# Patient Record
Sex: Female | Born: 1940
Health system: Southern US, Community
[De-identification: ages and names within clinical notes are randomized; demographics above are authoritative.]

## PROBLEM LIST (undated history)

## (undated) DIAGNOSIS — M5136 Other intervertebral disc degeneration, lumbar region: Secondary | ICD-10-CM

## (undated) DIAGNOSIS — M51369 Other intervertebral disc degeneration, lumbar region without mention of lumbar back pain or lower extremity pain: Secondary | ICD-10-CM

## (undated) DIAGNOSIS — M1990 Unspecified osteoarthritis, unspecified site: Secondary | ICD-10-CM

## (undated) DIAGNOSIS — I340 Nonrheumatic mitral (valve) insufficiency: Secondary | ICD-10-CM

## (undated) DIAGNOSIS — I2510 Atherosclerotic heart disease of native coronary artery without angina pectoris: Secondary | ICD-10-CM

## (undated) DIAGNOSIS — Z923 Personal history of irradiation: Secondary | ICD-10-CM

## (undated) DIAGNOSIS — I10 Essential (primary) hypertension: Secondary | ICD-10-CM

## (undated) DIAGNOSIS — Z9221 Personal history of antineoplastic chemotherapy: Secondary | ICD-10-CM

## (undated) DIAGNOSIS — I447 Left bundle-branch block, unspecified: Secondary | ICD-10-CM

## (undated) DIAGNOSIS — I5022 Chronic systolic (congestive) heart failure: Secondary | ICD-10-CM

## (undated) DIAGNOSIS — I428 Other cardiomyopathies: Secondary | ICD-10-CM

## (undated) DIAGNOSIS — K219 Gastro-esophageal reflux disease without esophagitis: Secondary | ICD-10-CM

## (undated) DIAGNOSIS — C801 Malignant (primary) neoplasm, unspecified: Secondary | ICD-10-CM

## (undated) DIAGNOSIS — I739 Peripheral vascular disease, unspecified: Secondary | ICD-10-CM

## (undated) DIAGNOSIS — N6459 Other signs and symptoms in breast: Secondary | ICD-10-CM

## (undated) HISTORY — DX: Chronic systolic (congestive) heart failure: I50.22

## (undated) HISTORY — DX: Essential (primary) hypertension: I10

## (undated) HISTORY — DX: Other signs and symptoms in breast: N64.59

## (undated) HISTORY — DX: Malignant (primary) neoplasm, unspecified: C80.1

## (undated) HISTORY — DX: Other cardiomyopathies: I42.8

## (undated) HISTORY — DX: Left bundle-branch block, unspecified: I44.7

## (undated) HISTORY — DX: Peripheral vascular disease, unspecified: I73.9

## (undated) HISTORY — DX: Nonrheumatic mitral (valve) insufficiency: I34.0

## (undated) HISTORY — PX: OOPHORECTOMY: SHX86

## (undated) HISTORY — DX: Atherosclerotic heart disease of native coronary artery without angina pectoris: I25.10

---

## 1974-07-05 HISTORY — PX: ABDOMINAL HYSTERECTOMY: SHX81

## 1978-07-05 HISTORY — PX: TUMOR REMOVAL: SHX12

## 2005-01-07 ENCOUNTER — Ambulatory Visit: Payer: Self-pay | Admitting: Unknown Physician Specialty

## 2005-02-01 ENCOUNTER — Emergency Department: Payer: Self-pay | Admitting: Emergency Medicine

## 2005-02-01 ENCOUNTER — Other Ambulatory Visit: Payer: Self-pay

## 2005-02-03 ENCOUNTER — Ambulatory Visit: Payer: Self-pay | Admitting: Unknown Physician Specialty

## 2005-02-19 ENCOUNTER — Ambulatory Visit: Payer: Self-pay | Admitting: Unknown Physician Specialty

## 2005-02-26 ENCOUNTER — Ambulatory Visit: Payer: Self-pay | Admitting: Surgery

## 2005-07-05 HISTORY — PX: CHOLECYSTECTOMY: SHX55

## 2005-09-29 ENCOUNTER — Ambulatory Visit: Payer: Self-pay

## 2006-01-17 ENCOUNTER — Ambulatory Visit (HOSPITAL_COMMUNITY): Admission: RE | Admit: 2006-01-17 | Discharge: 2006-01-17 | Payer: Self-pay | Admitting: Neurosurgery

## 2008-12-19 ENCOUNTER — Ambulatory Visit: Payer: Self-pay | Admitting: Internal Medicine

## 2009-03-18 ENCOUNTER — Ambulatory Visit: Payer: Self-pay | Admitting: General Practice

## 2009-04-04 ENCOUNTER — Ambulatory Visit: Payer: Self-pay | Admitting: General Practice

## 2009-05-05 ENCOUNTER — Ambulatory Visit: Payer: Self-pay | Admitting: General Practice

## 2009-06-04 ENCOUNTER — Ambulatory Visit: Payer: Self-pay | Admitting: General Practice

## 2010-10-15 ENCOUNTER — Ambulatory Visit: Payer: Self-pay | Admitting: Unknown Physician Specialty

## 2011-12-15 ENCOUNTER — Ambulatory Visit: Payer: Self-pay | Admitting: Physician Assistant

## 2011-12-29 ENCOUNTER — Ambulatory Visit (INDEPENDENT_AMBULATORY_CARE_PROVIDER_SITE_OTHER): Payer: Medicare Other | Admitting: Internal Medicine

## 2011-12-29 ENCOUNTER — Encounter: Payer: Self-pay | Admitting: Internal Medicine

## 2011-12-29 VITALS — BP 150/72 | HR 60 | Temp 98.3°F | Ht 62.5 in | Wt 176.0 lb

## 2011-12-29 DIAGNOSIS — E119 Type 2 diabetes mellitus without complications: Secondary | ICD-10-CM

## 2011-12-29 DIAGNOSIS — M25561 Pain in right knee: Secondary | ICD-10-CM | POA: Insufficient documentation

## 2011-12-29 DIAGNOSIS — M25569 Pain in unspecified knee: Secondary | ICD-10-CM

## 2011-12-29 DIAGNOSIS — E1159 Type 2 diabetes mellitus with other circulatory complications: Secondary | ICD-10-CM | POA: Insufficient documentation

## 2011-12-29 DIAGNOSIS — E785 Hyperlipidemia, unspecified: Secondary | ICD-10-CM

## 2011-12-29 DIAGNOSIS — I10 Essential (primary) hypertension: Secondary | ICD-10-CM | POA: Insufficient documentation

## 2011-12-29 LAB — LIPID PANEL
Cholesterol: 244 mg/dL — ABNORMAL HIGH (ref 0–200)
HDL: 56.8 mg/dL (ref >39.00)
Triglycerides: 191 mg/dL — ABNORMAL HIGH (ref 0.0–149.0)
VLDL: 38.2 mg/dL (ref 0.0–40.0)

## 2011-12-29 LAB — COMPREHENSIVE METABOLIC PANEL
ALT: 18 U/L (ref 0–35)
AST: 18 U/L (ref 0–37)
Albumin: 4.6 g/dL (ref 3.5–5.2)
Alkaline Phosphatase: 67 U/L (ref 39–117)
BUN: 22 mg/dL (ref 6–23)
CO2: 27 mEq/L (ref 19–32)
Calcium: 9.7 mg/dL (ref 8.4–10.5)
Chloride: 103 mEq/L (ref 96–112)
Creatinine, Ser: 1.2 mg/dL (ref 0.4–1.2)
GFR: 45.77 mL/min — ABNORMAL LOW (ref >60.00)
Glucose, Bld: 142 mg/dL — ABNORMAL HIGH (ref 70–99)
Potassium: 4.6 mEq/L (ref 3.5–5.1)
Sodium: 138 mEq/L (ref 135–145)
Total Bilirubin: 0.5 mg/dL (ref 0.3–1.2)
Total Protein: 6.9 g/dL (ref 6.0–8.3)

## 2011-12-29 LAB — LDL CHOLESTEROL, DIRECT: Direct LDL: 152.7 mg/dL

## 2011-12-29 MED ORDER — GLIMEPIRIDE 2 MG PO TABS: 2.0000 mg | ORAL_TABLET | Freq: Two times a day (BID) | ORAL | Status: DC

## 2011-12-29 NOTE — Assessment & Plan Note (Signed)
Cholesterol is elevated on labs today. Question whether patient is taking Crestor. Goal LDL less than 70. Will confirm with patient and either resume dose of medication or increase to 20 mg daily. Plan to repeat lipids in one month.

## 2011-12-29 NOTE — Assessment & Plan Note (Signed)
Blood sugar controlled just above goal with hemoglobin A1c of 7.4%. For now, we'll continue current medications. Encouraged compliance with healthy diet and regular physical activity. Will plan to repeat A1c in September 2013.

## 2011-12-29 NOTE — Assessment & Plan Note (Signed)
Patient with severe right knee pain x3 months. Has had extensive evaluation including plain x-rays which were normal. Also had ultrasounds of her right lower extremity because of concern about blood clots which she reports was normal. Question if she may have a Baker's cyst in her posterior right knee. Alternative consideration would be meniscal tear. Will plan to get MRI for further evaluation. She is unable to tolerate nonsteroidals, so will continue Tylenol for pain.

## 2011-12-29 NOTE — Progress Notes (Signed)
Subjective:    Patient ID: Kelsey Mason, female    DOB: Sep 03, 1940, 71 y.o.   MRN: [id number]  HPI 71 year old female with history of diabetes, hypertension, hyperlipidemia presents to establish care. In regards to diabetes, she does not regularly check her blood sugars. She is unsure of last A1c. She reports full compliance with her medications. In regards to hyperlipidemia and hypertension, she also reports compliance with medication. She denies any chest pain, palpitations, headache.  Her primary concern today is severe right knee pain. This is been present since March 2013. It first occurred after a [place] trip. The pain is described as aching in the back of her knee which radiates down her leg. This has been evaluated both at urgent care and by an orthopedic surgeon. She also reports having x-rays which were normal. There was initially some concern about DVT in her right lower extremity and she had ultrasounds which were normal. She reports that pain is significant and limits her mobility. She occasionally feels some instability in her knee. She has not had any falls.  Outpatient Encounter Prescriptions as of 12/29/2011  Medication Sig Dispense Refill  . amLODipine-benazepril (LOTREL) 5-20 MG per capsule Take 1 capsule by mouth daily.      [name] aspirin (BAYER ASPIRIN) 325 MG tablet Take 325 mg by mouth daily.      . cyanocobalamin 2000 MCG tablet Take 2,000 mcg by mouth daily.      [name] glimepiride (AMARYL) 2 MG tablet Take 1 tablet (2 mg total) by mouth 2 (two) times daily.  60 tablet  6  . metFORMIN (GLUCOPHAGE) 1000 MG tablet Take 500 mg by mouth 2 (two) times daily.      . rosuvastatin (CRESTOR) 10 MG tablet Take 10 mg by mouth daily.      [name] DISCONTD: glimepiride (AMARYL) 2 MG tablet Take 2 mg by mouth 2 (two) times daily.        Review of Systems  Constitutional: Negative for fever, chills, appetite change, fatigue and unexpected weight change.  HENT: Negative for ear pain,  congestion, sore throat, trouble swallowing, neck pain, voice change and sinus pressure.   Eyes: Negative for visual disturbance.  Respiratory: Negative for cough, shortness of breath, wheezing and stridor.   Cardiovascular: Negative for chest pain, palpitations and leg swelling.  Gastrointestinal: Negative for nausea, vomiting, abdominal pain, diarrhea, constipation, blood in stool, abdominal distention and anal bleeding.  Genitourinary: Negative for dysuria and flank pain.  Musculoskeletal: Positive for myalgias, joint swelling and arthralgias. Negative for gait problem.  Skin: Negative for color change and rash.  Neurological: Negative for dizziness and headaches.  Hematological: Negative for adenopathy. Does not bruise/bleed easily.  Psychiatric/Behavioral: Negative for suicidal ideas, disturbed wake/sleep cycle and dysphoric mood. The patient is not nervous/anxious.    BP 150/72  Pulse 60  Temp 98.3 F (36.8 C) (Oral)  Ht 5' 2.5" (1.588 m)  Wt 176 lb (79.833 kg)  BMI 31.68 kg/m2  SpO2 97%     Objective:   Physical Exam  Constitutional: She is oriented to person, place, and time. She appears well-developed and well-nourished. No distress.  HENT:  Head: Normocephalic and atraumatic.  Right Ear: External ear normal.  Left Ear: External ear normal.  Nose: Nose normal.  Mouth/Throat: Oropharynx is clear and moist. No oropharyngeal exudate.  Eyes: Conjunctivae are normal. Pupils are equal, round, and reactive to light. Right eye exhibits no discharge. Left eye exhibits no discharge. No scleral icterus.  Neck: Normal  range of motion. Neck supple. No tracheal deviation present. No thyromegaly present.  Cardiovascular: Normal rate, regular rhythm, normal heart sounds and intact distal pulses.  Exam reveals no gallop and no friction rub.   No murmur heard. Pulmonary/Chest: Effort normal and breath sounds normal. No respiratory distress. She has no wheezes. She has no rales. She exhibits  no tenderness.  Musculoskeletal: She exhibits no edema and no tenderness.       Right knee: She exhibits decreased range of motion and swelling. She exhibits no effusion and no erythema.  Lymphadenopathy:    She has no cervical adenopathy.  Neurological: She is alert and oriented to person, place, and time. No cranial nerve deficit. She exhibits normal muscle tone. Coordination normal.  Skin: Skin is warm and dry. No rash noted. She is not diaphoretic. No erythema. No pallor.  Psychiatric: She has a normal mood and affect. Her behavior is normal. Judgment and thought content normal.          Assessment & Plan:

## 2011-12-29 NOTE — Assessment & Plan Note (Signed)
Blood pressure elevated today, but suspect related to pain. Will continue to monitor for now. We'll continue current medications. Renal function normal on labs today. Followup in one month.

## 2012-01-05 ENCOUNTER — Ambulatory Visit: Payer: Self-pay | Admitting: Internal Medicine

## 2012-01-07 ENCOUNTER — Telehealth: Payer: Self-pay | Admitting: Internal Medicine

## 2012-01-07 DIAGNOSIS — M1710 Unilateral primary osteoarthritis, unspecified knee: Secondary | ICD-10-CM

## 2012-01-07 NOTE — Telephone Encounter (Signed)
MRI right knee showed full thickness cartilage loss secondary to degenerative changes. Would recommend setting up orthopedics referral.

## 2012-01-07 NOTE — Telephone Encounter (Signed)
Left message on machine at home for patient to return call.

## 2012-01-10 NOTE — Telephone Encounter (Signed)
Patient advised as instructed via telephone, she agrees to see Orthopedics and would like to go to [facility] where she has been seen before.  Please call patient on cell number regarding referral.

## 2012-01-13 ENCOUNTER — Encounter: Payer: Self-pay | Admitting: Internal Medicine

## 2012-02-02 ENCOUNTER — Ambulatory Visit (INDEPENDENT_AMBULATORY_CARE_PROVIDER_SITE_OTHER): Payer: Medicare Other | Admitting: Internal Medicine

## 2012-02-02 ENCOUNTER — Encounter: Payer: Self-pay | Admitting: Internal Medicine

## 2012-02-02 VITALS — BP 156/80 | HR 68 | Temp 97.8°F | Ht 62.5 in | Wt 176.2 lb

## 2012-02-02 DIAGNOSIS — M79661 Pain in right lower leg: Secondary | ICD-10-CM

## 2012-02-02 DIAGNOSIS — M79662 Pain in left lower leg: Secondary | ICD-10-CM | POA: Insufficient documentation

## 2012-02-02 DIAGNOSIS — E119 Type 2 diabetes mellitus without complications: Secondary | ICD-10-CM

## 2012-02-02 DIAGNOSIS — M79609 Pain in unspecified limb: Secondary | ICD-10-CM

## 2012-02-02 DIAGNOSIS — E785 Hyperlipidemia, unspecified: Secondary | ICD-10-CM

## 2012-02-02 DIAGNOSIS — I10 Essential (primary) hypertension: Secondary | ICD-10-CM

## 2012-02-02 LAB — HM DIABETES EYE EXAM

## 2012-02-02 MED ORDER — AMLODIPINE BESY-BENAZEPRIL HCL 5-20 MG PO CAPS: 1.0000 | ORAL_CAPSULE | Freq: Every day | ORAL | Status: DC

## 2012-02-02 MED ORDER — GLUCOSE BLOOD VI STRP: ORAL_STRIP | Status: DC

## 2012-02-02 MED ORDER — METFORMIN HCL 1000 MG PO TABS: 500.0000 mg | ORAL_TABLET | Freq: Two times a day (BID) | ORAL | Status: DC

## 2012-02-02 MED ORDER — GLIMEPIRIDE 2 MG PO TABS: 2.0000 mg | ORAL_TABLET | Freq: Two times a day (BID) | ORAL | Status: DC

## 2012-02-02 NOTE — Assessment & Plan Note (Signed)
Patient reports full compliance with medications. We'll plan to check A1c with labs next week.

## 2012-02-02 NOTE — Assessment & Plan Note (Signed)
Blood pressure slightly elevated today. For now, we'll continue current medications. Will check renal function with labs next week. Patient will monitor blood pressure and call if consistently greater than 140/90. Patient will followup in 3 months.

## 2012-02-02 NOTE — Assessment & Plan Note (Signed)
LDL at previous visit in June 2013 was greater than 150. Discussed goal of less than 70 in the setting of diabetes. Patient has been more compliant Crestor. Will recheck fasting lipids next week.

## 2012-02-02 NOTE — Progress Notes (Signed)
Subjective:    Patient ID: Kelsey Mason, female    DOB: [birth year], 71 y.o.   MRN: [id number]  HPI 71 year old female with history of hypertension, hyperlipidemia, diabetes mellitus, and right greater than left leg and calf pain presents for followup. In the interim since her last visit, she reports she was seen by orthopedic surgery. She had MRI of her right knee which showed loss of cartilage. Orthopedic surgeon recommended that she undergo physical therapy to help with symptoms. This is planning to start tomorrow. She continues to have cramping pain in her calves and posterior right leg with exertion. She is not interested in vascular evaluation.  In regards to history of hypertension, hyperlipidemia, and diabetes, she reports full compliance with her medications. At her last visit, she was noted to have elevated LDL of 150. There is question about compliance of Crestor. She reports full compliance with medications at this point. She did not bring a record of blood sugars today.  Outpatient Encounter Prescriptions as of 02/02/2012  Medication Sig Dispense Refill  . amLODipine-benazepril (LOTREL) 5-20 MG per capsule Take 1 capsule by mouth daily.  90 capsule  4  . aspirin (BAYER ASPIRIN) 325 MG tablet Take 325 mg by mouth daily.      . cyanocobalamin 2000 MCG tablet Take 2,000 mcg by mouth daily.      [name] glimepiride (AMARYL) 2 MG tablet Take 1 tablet (2 mg total) by mouth 2 (two) times daily.  180 tablet  4  . metFORMIN (GLUCOPHAGE) 1000 MG tablet Take 0.5 tablets (500 mg total) by mouth 2 (two) times daily.  180 tablet  4  . rosuvastatin (CRESTOR) 10 MG tablet Take 10 mg by mouth daily.      [name] glucose blood (GLUCOLAB TEST) test strip Use as instructed  100 each  12   BP 156/80  Pulse 68  Temp 97.8 F (36.6 C) (Oral)  Ht 5' 2.5" (1.588 m)  Wt 176 lb 4 oz (79.946 kg)  BMI 31.72 kg/m2  SpO2 98%  Review of Systems  Constitutional: Negative for fever, chills, appetite change, fatigue  and unexpected weight change.  HENT: Negative for ear pain, congestion, sore throat, trouble swallowing, neck pain, voice change and sinus pressure.   Eyes: Negative for visual disturbance.  Respiratory: Negative for cough, shortness of breath, wheezing and stridor.   Cardiovascular: Negative for chest pain, palpitations and leg swelling.  Gastrointestinal: Negative for nausea, vomiting, abdominal pain, diarrhea, constipation, blood in stool, abdominal distention and anal bleeding.  Genitourinary: Negative for dysuria and flank pain.  Musculoskeletal: Positive for myalgias. Negative for arthralgias and gait problem.  Skin: Negative for color change and rash.  Neurological: Negative for dizziness and headaches.  Hematological: Negative for adenopathy. Does not bruise/bleed easily.  Psychiatric/Behavioral: Negative for suicidal ideas, disturbed wake/sleep cycle and dysphoric mood. The patient is not nervous/anxious.        Objective:   Physical Exam  Constitutional: She is oriented to person, place, and time. She appears well-developed and well-nourished. No distress.  HENT:  Head: Normocephalic and atraumatic.  Right Ear: External ear normal.  Left Ear: External ear normal.  Nose: Nose normal.  Mouth/Throat: Oropharynx is clear and moist. No oropharyngeal exudate.  Eyes: Conjunctivae are normal. Pupils are equal, round, and reactive to light. Right eye exhibits no discharge. Left eye exhibits no discharge. No scleral icterus.  Neck: Normal range of motion. Neck supple. No tracheal deviation present. No thyromegaly present.  Cardiovascular: Normal rate, regular rhythm,  normal heart sounds and intact distal pulses.  Exam reveals no gallop and no friction rub.   No murmur heard. Pulmonary/Chest: Effort normal and breath sounds normal. No respiratory distress. She has no wheezes. She has no rales. She exhibits no tenderness.  Musculoskeletal: Normal range of motion. She exhibits no edema and  no tenderness.  Lymphadenopathy:    She has no cervical adenopathy.  Neurological: She is alert and oriented to person, place, and time. No cranial nerve deficit. She exhibits normal muscle tone. Coordination normal.  Skin: Skin is warm and dry. No rash noted. She is not diaphoretic. No erythema. No pallor.  Psychiatric: She has a normal mood and affect. Her behavior is normal. Judgment and thought content normal.          Assessment & Plan:

## 2012-02-02 NOTE — Assessment & Plan Note (Signed)
Calf and right posterior thigh pain seem most consistent with claudication. Pedal pulses are normal. Encourage patient to consider vascular evaluation with ABIs and arterial Dopplers. She is not interested at this point.

## 2012-02-03 ENCOUNTER — Encounter: Payer: Self-pay | Admitting: Physician Assistant

## 2012-02-09 ENCOUNTER — Other Ambulatory Visit: Payer: Self-pay | Admitting: Internal Medicine

## 2012-02-09 ENCOUNTER — Other Ambulatory Visit (INDEPENDENT_AMBULATORY_CARE_PROVIDER_SITE_OTHER): Payer: Medicare Other | Admitting: *Deleted

## 2012-02-09 DIAGNOSIS — E119 Type 2 diabetes mellitus without complications: Secondary | ICD-10-CM

## 2012-02-09 DIAGNOSIS — E785 Hyperlipidemia, unspecified: Secondary | ICD-10-CM

## 2012-02-09 LAB — LIPID PANEL: Triglycerides: 107 mg/dL (ref 0.0–149.0)

## 2012-02-09 LAB — COMPREHENSIVE METABOLIC PANEL
ALT: 18 U/L (ref 0–35)
AST: 15 U/L (ref 0–37)
Albumin: 4.1 g/dL (ref 3.5–5.2)
Creatinine, Ser: 1.3 mg/dL — ABNORMAL HIGH (ref 0.4–1.2)
Total Bilirubin: 0.6 mg/dL (ref 0.3–1.2)

## 2012-02-09 LAB — HEMOGLOBIN A1C: Hgb A1c MFr Bld: 7.2 % — ABNORMAL HIGH (ref 4.6–6.5)

## 2012-02-09 MED ORDER — CYCLOBENZAPRINE HCL 5 MG PO TABS: 5.0000 mg | ORAL_TABLET | Freq: Three times a day (TID) | ORAL | Status: AC | PRN

## 2012-03-05 ENCOUNTER — Encounter: Payer: Self-pay | Admitting: Physician Assistant

## 2012-04-03 ENCOUNTER — Ambulatory Visit (INDEPENDENT_AMBULATORY_CARE_PROVIDER_SITE_OTHER): Payer: Medicare Other | Admitting: Internal Medicine

## 2012-04-03 ENCOUNTER — Encounter: Payer: Self-pay | Admitting: Internal Medicine

## 2012-04-03 VITALS — BP 128/64 | HR 71 | Temp 98.2°F | Ht 63.5 in | Wt 175.5 lb

## 2012-04-03 DIAGNOSIS — E785 Hyperlipidemia, unspecified: Secondary | ICD-10-CM

## 2012-04-03 DIAGNOSIS — I10 Essential (primary) hypertension: Secondary | ICD-10-CM

## 2012-04-03 DIAGNOSIS — E119 Type 2 diabetes mellitus without complications: Secondary | ICD-10-CM

## 2012-04-03 DIAGNOSIS — M79609 Pain in unspecified limb: Secondary | ICD-10-CM

## 2012-04-03 DIAGNOSIS — M79661 Pain in right lower leg: Secondary | ICD-10-CM

## 2012-04-03 NOTE — Progress Notes (Signed)
Subjective:    Patient ID: Kelsey Mason, female    DOB: [birth year], 71 y.o.   MRN: [id number]  HPI 71 year old female with history of hypertension, diabetes, hyperlipidemia presents for followup. In the interim since her last visit, she reports she was seen by her cardiologist and had blood work including cholesterol and A1c checked. She reports that she was told lab work looked good. She reports full compliance with medications. She denies any new concerns today. In regards to chronic bilateral calf pain, she reports no improvement after recent physical therapy. She reports cramping pain in her bilateral calves particularly with repetitive movements such as when driving her car. This has been long-standing for several years. She has declined vascular evaluation in the past.   In regards to health maintenance, note that patient declines mammogram and Pap smear.  Outpatient Encounter Prescriptions as of 04/03/2012  Medication Sig Dispense Refill  . amLODipine-benazepril (LOTREL) 5-20 MG per capsule Take 1 capsule by mouth daily.  90 capsule  4  . aspirin (BAYER ASPIRIN) 325 MG tablet Take 325 mg by mouth daily.      [name] glimepiride (AMARYL) 2 MG tablet Take 1 tablet (2 mg total) by mouth 2 (two) times daily.  180 tablet  4  . glucose blood (GLUCOLAB TEST) test strip Use as instructed  100 each  12  . metFORMIN (GLUCOPHAGE) 1000 MG tablet Take 0.5 tablets (500 mg total) by mouth 2 (two) times daily.  180 tablet  4  . rosuvastatin (CRESTOR) 10 MG tablet Take 10 mg by mouth daily.      . cyanocobalamin 2000 MCG tablet Take 2,000 mcg by mouth daily.       BP 128/64  Pulse 71  Temp 98.2 F (36.8 C) (Oral)  Ht 5' 3.5" (1.613 m)  Wt 175 lb 8 oz (79.606 kg)  BMI 30.60 kg/m2  SpO2 98%  Review of Systems  Constitutional: Negative for fever, chills, appetite change, fatigue and unexpected weight change.  HENT: Negative for ear pain, congestion, sore throat, trouble swallowing, neck pain, voice  change and sinus pressure.   Eyes: Negative for visual disturbance.  Respiratory: Negative for cough, shortness of breath, wheezing and stridor.   Cardiovascular: Negative for chest pain, palpitations and leg swelling.  Gastrointestinal: Negative for nausea, vomiting, abdominal pain, diarrhea, constipation, blood in stool, abdominal distention and anal bleeding.  Genitourinary: Negative for dysuria and flank pain.  Musculoskeletal: Positive for myalgias. Negative for arthralgias and gait problem.  Skin: Negative for color change and rash.  Neurological: Negative for dizziness and headaches.  Hematological: Negative for adenopathy. Does not bruise/bleed easily.  Psychiatric/Behavioral: Negative for suicidal ideas, disturbed wake/sleep cycle and dysphoric mood. The patient is not nervous/anxious.        Objective:   Physical Exam  Constitutional: She is oriented to person, place, and time. She appears well-developed and well-nourished. No distress.  HENT:  Head: Normocephalic and atraumatic.  Right Ear: External ear normal.  Left Ear: External ear normal.  Nose: Nose normal.  Mouth/Throat: Oropharynx is clear and moist. No oropharyngeal exudate.  Eyes: Conjunctivae normal are normal. Pupils are equal, round, and reactive to light. Right eye exhibits no discharge. Left eye exhibits no discharge. No scleral icterus.  Neck: Normal range of motion. Neck supple. No tracheal deviation present. No thyromegaly present.  Cardiovascular: Normal rate, regular rhythm, normal heart sounds and intact distal pulses.  Exam reveals no gallop and no friction rub.   No murmur heard. Pulmonary/Chest: Effort  normal and breath sounds normal. No respiratory distress. She has no wheezes. She has no rales. She exhibits no tenderness.  Musculoskeletal: Normal range of motion. She exhibits no edema and no tenderness.  Lymphadenopathy:    She has no cervical adenopathy.  Neurological: She is alert and oriented to  person, place, and time. No cranial nerve deficit. She exhibits normal muscle tone. Coordination normal.  Skin: Skin is warm and dry. No rash noted. She is not diaphoretic. No erythema. No pallor.  Psychiatric: She has a normal mood and affect. Her behavior is normal. Judgment and thought content normal.          Assessment & Plan:

## 2012-04-03 NOTE — Assessment & Plan Note (Addendum)
Symptoms of pain in bilateral calves worsened with motion is unchanged. No improvement with physical therapy. Symptoms seem concerning for claudication however patient declines vascular evaluation with ABIs and arterial Dopplers. Will continue to monitor for now.

## 2012-04-03 NOTE — Assessment & Plan Note (Signed)
Pt reports A1c was recently checked with cardiologist. Will request records on this. Plan for q 6 month follow up here and q58month with cardiologist. Goal A1c<7%.

## 2012-04-03 NOTE — Assessment & Plan Note (Signed)
BP well controlled today. Will request recent labs including renal function from cardiologist. Follow up 6 months and prn.

## 2012-04-03 NOTE — Assessment & Plan Note (Signed)
Will request recent lipids and lfts from cardiologist. Continue Crestor. Follow up 6 months and prn.

## 2012-06-08 ENCOUNTER — Telehealth: Payer: Self-pay | Admitting: Internal Medicine

## 2012-06-08 NOTE — Telephone Encounter (Signed)
Patient Information:  Caller Name: Gusta  Phone: [phone]  Patient: Kelsey Mason, Kelsey Mason  Gender: Female  DOB: 1941-06-04  Age: 71 Years  PCP: Ronette Deter (Adults only)   Symptoms  Reason For Call & Symptoms: Got up with sore throat and cough; Is getting some tightness in chest.  Reviewed Health History In EMR: Yes  Reviewed Medications In EMR: Yes  Reviewed Allergies In EMR: Yes  Reviewed Surgeries / Procedures: Yes  Date of Onset of Symptoms: 06/08/2012  Guideline(s) Used:  Cough  Disposition Per Guideline:   See Today or Tomorrow in Office  Reason For Disposition Reached:   Patient wants to be seen  Advice Given:  Prevent Dehydration:  Drink adequate liquids.  Coughing Spasms:  Drink warm fluids. Inhale warm mist (Reason: both relax the airway and loosen up the phlegm).  Avoid Tobacco Smoke:  Smoking or being exposed to smoke makes coughs much worse.  Expected Course:   The expected course depends on what is causing the cough.  Viral bronchitis (chest cold) causes a cough that lasts 1 to 3 weeks. Sometimes you may cough up lots of phlegm (sputum, mucus). The mucus can normally be white, gray, yellow, or green.  Call Back If:  Difficulty breathing  Cough lasts more than 3 weeks  Fever lasts > 3 days  You become worse.  Office Follow Up:  Does the office need to follow up with this patient?: No  Instructions For The Office: N/A  Appointment Scheduled:  06/09/2012 15:50:00 Appointment Scheduled Provider:  Ronette Deter (Adults only)  RN Note:  Cough is dry and will cough hard enough to gag; Cough comes in episodes. No cold symptoms.

## 2012-06-09 ENCOUNTER — Encounter: Payer: Self-pay | Admitting: Internal Medicine

## 2012-06-09 ENCOUNTER — Ambulatory Visit (INDEPENDENT_AMBULATORY_CARE_PROVIDER_SITE_OTHER): Payer: Medicare Other | Admitting: Internal Medicine

## 2012-06-09 VITALS — BP 148/80 | HR 86 | Temp 98.5°F | Resp 16 | Wt 176.5 lb

## 2012-06-09 DIAGNOSIS — J40 Bronchitis, not specified as acute or chronic: Secondary | ICD-10-CM

## 2012-06-09 DIAGNOSIS — E119 Type 2 diabetes mellitus without complications: Secondary | ICD-10-CM

## 2012-06-09 MED ORDER — GLUCOSE BLOOD VI STRP: ORAL_STRIP | Status: DC

## 2012-06-09 MED ORDER — AZITHROMYCIN 250 MG PO TABS: ORAL_TABLET | ORAL | Status: DC

## 2012-06-09 MED ORDER — BENZONATATE 200 MG PO CAPS: 200.0000 mg | ORAL_CAPSULE | Freq: Two times a day (BID) | ORAL | Status: DC | PRN

## 2012-06-09 MED ORDER — GUAIFENESIN-CODEINE 100-10 MG/5ML PO SYRP: 5.0000 mL | ORAL_SOLUTION | Freq: Two times a day (BID) | ORAL | Status: DC | PRN

## 2012-06-11 DIAGNOSIS — J40 Bronchitis, not specified as acute or chronic: Secondary | ICD-10-CM | POA: Insufficient documentation

## 2012-06-11 NOTE — Progress Notes (Signed)
Subjective:    Patient ID: Kelsey Mason, female    DOB: August 31, 1940, 71 y.o.   MRN: [id number]  HPI 71 year old female with history of diabetes, hypertension, hyperlipidemia presents for acute visit complaining of four-day history of nasal congestion, postnasal drip, cough productive of purulent sputum, and mild shortness of breath. She has been taking over-the-counter cough and cold preparations with no. She denies any fever, chills, chest pain. She reports that blood sugars have been elevated since she has become sick. She reports compliance with her medications.  Outpatient Encounter Prescriptions as of 06/09/2012  Medication Sig Dispense Refill  . amLODipine-benazepril (LOTREL) 5-20 MG per capsule Take 1 capsule by mouth daily.  90 capsule  4  . aspirin (BAYER ASPIRIN) 325 MG tablet Take 325 mg by mouth daily.      . cyanocobalamin 2000 MCG tablet Take 2,000 mcg by mouth daily.      [name] glimepiride (AMARYL) 2 MG tablet Take 1 tablet (2 mg total) by mouth 2 (two) times daily.  180 tablet  4  . glucose blood (GLUCOLAB TEST) test strip Use as instructed  100 each  12  . metFORMIN (GLUCOPHAGE) 1000 MG tablet Take 0.5 tablets (500 mg total) by mouth 2 (two) times daily.  180 tablet  4  . rosuvastatin (CRESTOR) 10 MG tablet Take 10 mg by mouth daily.      . [DISCONTINUED] glucose blood (GLUCOLAB TEST) test strip Use as instructed  100 each  12  . azithromycin (ZITHROMAX Z-PAK) 250 MG tablet Take 2 pills day 1, then 1 pill daily days 2-5  6 each  0  . benzonatate (TESSALON) 200 MG capsule Take 1 capsule (200 mg total) by mouth 2 (two) times daily as needed for cough.  20 capsule  0  . guaiFENesin-codeine (ROBITUSSIN AC) 100-10 MG/5ML syrup Take 5 mLs by mouth 2 (two) times daily as needed for cough.  240 mL  0   BP 148/80  Pulse 86  Temp 98.5 F (36.9 C) (Oral)  Resp 16  Wt 176 lb 8 oz (80.06 kg)  SpO2 98%  Review of Systems  Constitutional: Positive for fatigue. Negative for fever,  chills and unexpected weight change.  HENT: Positive for congestion, rhinorrhea and postnasal drip. Negative for hearing loss, ear pain, nosebleeds, sore throat, facial swelling, sneezing, mouth sores, trouble swallowing, neck pain, neck stiffness, voice change, sinus pressure, tinnitus and ear discharge.   Eyes: Negative for pain, discharge, redness and visual disturbance.  Respiratory: Positive for cough and shortness of breath. Negative for chest tightness, wheezing and stridor.   Cardiovascular: Negative for chest pain, palpitations and leg swelling.  Musculoskeletal: Negative for myalgias and arthralgias.  Skin: Negative for color change and rash.  Neurological: Negative for dizziness, weakness, light-headedness and headaches.  Hematological: Negative for adenopathy.       Objective:   Physical Exam  Constitutional: She is oriented to person, place, and time. She appears well-developed and well-nourished. No distress.  HENT:  Head: Normocephalic and atraumatic.  Right Ear: External ear normal.  Left Ear: External ear normal.  Nose: Nose normal.  Mouth/Throat: Oropharynx is clear and moist. No oropharyngeal exudate.  Eyes: Conjunctivae normal are normal. Pupils are equal, round, and reactive to light. Right eye exhibits no discharge. Left eye exhibits no discharge. No scleral icterus.  Neck: Normal range of motion. Neck supple. No tracheal deviation present. No thyromegaly present.  Cardiovascular: Normal rate, regular rhythm, normal heart sounds and intact distal pulses.  Exam  reveals no gallop and no friction rub.   No murmur heard. Pulmonary/Chest: Effort normal. No accessory muscle usage. Not tachypneic. No respiratory distress. She has no decreased breath sounds. She has no wheezes. She has rhonchi (scattered). She has no rales. She exhibits no tenderness.  Musculoskeletal: Normal range of motion. She exhibits no edema and no tenderness.  Lymphadenopathy:    She has no cervical  adenopathy.  Neurological: She is alert and oriented to person, place, and time. No cranial nerve deficit. She exhibits normal muscle tone. Coordination normal.  Skin: Skin is warm and dry. No rash noted. She is not diaphoretic. No erythema. No pallor.  Psychiatric: She has a normal mood and affect. Her behavior is normal. Judgment and thought content normal.          Assessment & Plan:

## 2012-06-11 NOTE — Assessment & Plan Note (Signed)
Symptoms consistent with bronchitis. Will treat with azithromycin, and use Tessalon and Codeine for cough. Pt will call if symptoms are not improving over next 48hr.  If no improvement, would favor adding Prednisone taper.

## 2012-07-05 DIAGNOSIS — C801 Malignant (primary) neoplasm, unspecified: Secondary | ICD-10-CM

## 2012-07-05 DIAGNOSIS — Z923 Personal history of irradiation: Secondary | ICD-10-CM

## 2012-07-05 DIAGNOSIS — N6459 Other signs and symptoms in breast: Secondary | ICD-10-CM

## 2012-07-05 HISTORY — PX: PORTACATH PLACEMENT: SHX2246

## 2012-07-05 HISTORY — PX: BREAST SURGERY: SHX581

## 2012-07-05 HISTORY — DX: Personal history of irradiation: Z92.3

## 2012-07-05 HISTORY — DX: Other signs and symptoms in breast: N64.59

## 2012-07-05 HISTORY — DX: Malignant (primary) neoplasm, unspecified: C80.1

## 2012-10-02 ENCOUNTER — Ambulatory Visit (INDEPENDENT_AMBULATORY_CARE_PROVIDER_SITE_OTHER)
Admission: RE | Admit: 2012-10-02 | Discharge: 2012-10-02 | Disposition: A | Discharge status: Home / Self Care | Payer: Medicare Other | Source: Ambulatory Visit | Attending: Internal Medicine | Admitting: Internal Medicine

## 2012-10-02 ENCOUNTER — Ambulatory Visit (INDEPENDENT_AMBULATORY_CARE_PROVIDER_SITE_OTHER): Payer: Medicare Other | Admitting: Internal Medicine

## 2012-10-02 ENCOUNTER — Encounter: Payer: Self-pay | Admitting: Internal Medicine

## 2012-10-02 VITALS — BP 160/70 | HR 68 | Temp 98.0°F | Ht 63.0 in | Wt 179.0 lb

## 2012-10-02 DIAGNOSIS — R222 Localized swelling, mass and lump, trunk: Secondary | ICD-10-CM | POA: Insufficient documentation

## 2012-10-02 DIAGNOSIS — Z0000 Encounter for general adult medical examination without abnormal findings: Secondary | ICD-10-CM

## 2012-10-02 DIAGNOSIS — N63 Unspecified lump in unspecified breast: Secondary | ICD-10-CM

## 2012-10-02 DIAGNOSIS — N6320 Unspecified lump in the left breast, unspecified quadrant: Secondary | ICD-10-CM | POA: Insufficient documentation

## 2012-10-02 DIAGNOSIS — E785 Hyperlipidemia, unspecified: Secondary | ICD-10-CM

## 2012-10-02 DIAGNOSIS — I10 Essential (primary) hypertension: Secondary | ICD-10-CM

## 2012-10-02 DIAGNOSIS — E119 Type 2 diabetes mellitus without complications: Secondary | ICD-10-CM

## 2012-10-02 LAB — COMPREHENSIVE METABOLIC PANEL
ALT: 21 U/L (ref 0–35)
AST: 16 U/L (ref 0–37)
Albumin: 4.2 g/dL (ref 3.5–5.2)
Alkaline Phosphatase: 69 U/L (ref 39–117)
BUN: 17 mg/dL (ref 6–23)
CO2: 27 mEq/L (ref 19–32)
Calcium: 9 mg/dL (ref 8.4–10.5)
GFR: 57.34 mL/min — ABNORMAL LOW (ref >60.00)
Glucose, Bld: 150 mg/dL — ABNORMAL HIGH (ref 70–99)
Potassium: 4.5 mEq/L (ref 3.5–5.1)
Total Bilirubin: 0.5 mg/dL (ref 0.3–1.2)
Total Protein: 6.7 g/dL (ref 6.0–8.3)

## 2012-10-02 LAB — MICROALBUMIN / CREATININE URINE RATIO
Microalb Creat Ratio: 5.6 mg/g (ref 0.0–30.0)
Microalb, Ur: 4.3 mg/dL — ABNORMAL HIGH (ref 0.0–1.9)

## 2012-10-02 LAB — CBC WITH DIFFERENTIAL/PLATELET
Basophils Relative: 0.5 % (ref 0.0–3.0)
Eosinophils Relative: 8.2 % — ABNORMAL HIGH (ref 0.0–5.0)
Lymphocytes Relative: 19.4 % (ref 12.0–46.0)
MCHC: 33.7 g/dL (ref 30.0–36.0)
MCV: 88.3 fl (ref 78.0–100.0)
Monocytes Relative: 5.2 % (ref 3.0–12.0)
Platelets: 195 10*3/uL (ref 150.0–400.0)
RDW: 13 % (ref 11.5–14.6)
WBC: 7.1 10*3/uL (ref 4.5–10.5)

## 2012-10-02 LAB — LIPID PANEL: Cholesterol: 247 mg/dL — ABNORMAL HIGH (ref 0–200)

## 2012-10-02 MED ORDER — ROSUVASTATIN CALCIUM 10 MG PO TABS: 10.0000 mg | ORAL_TABLET | Freq: Every day | ORAL | Status: DC

## 2012-10-02 MED ORDER — AMLODIPINE BESY-BENAZEPRIL HCL 5-20 MG PO CAPS: 1.0000 | ORAL_CAPSULE | Freq: Every day | ORAL | Status: DC

## 2012-10-02 NOTE — Assessment & Plan Note (Addendum)
Left breast mass concerning for malignancy. Will get diagnostic mammogram and [place]. Will set up general surgery evaluation once results of imaging available.

## 2012-10-02 NOTE — Assessment & Plan Note (Signed)
Will check A1c with labs today. Continue current medications. 

## 2012-10-02 NOTE — Progress Notes (Signed)
Subjective:    Patient ID: Kelsey Mason, female    DOB: 03-17-1941, 72 y.o.   MRN: [id number]  HPI The patient is here for annual Medicare wellness examination and management of other chronic and acute problems.   The risk factors are reflected in the social history.  The roster of all physicians providing medical care to patient - is listed in the Snapshot section of the chart.  Activities of daily living:  The patient is 100% independent in all ADLs: dressing, toileting, feeding as well as independent mobility  Home safety : The patient has smoke detectors in the home. They wear seatbelts.  There are no firearms at home. There is no violence in the home.   There is no risks for hepatitis, STDs or HIV. There is no history of blood transfusion. They have no travel history to infectious disease endemic areas of the world.  The patient has seen their dentist in the last six month. ([facility]) They have seen their eye doctor in the last year. [facility]) No issues with hearing. They have deferred audiologic testing in the last year.   They do not  have excessive sun exposure. Discussed the need for sun protection: hats, long sleeves and use of sunscreen if there is significant sun exposure.   Diet: the importance of a healthy diet is discussed. They do have a relatively healthy diet. Limited vegetables.  The benefits of regular aerobic exercise were discussed. She dances 3 nights per week.  Depression screen: there are no signs or vegative symptoms of depression- irritability, change in appetite, anhedonia, sadness/tearfullness.  Cognitive assessment: the patient manages all their financial and personal affairs and is actively engaged. They could relate day,date,year and events.  The following portions of the patient's history were reviewed and updated as appropriate: allergies, current medications, past family history, past medical history,  past surgical history, past  social history  and problem list.  Visual acuity was not assessed per patient preference since she has regular follow up with her ophthalmologist. Hearing and body mass index were assessed and reviewed.   During the course of the visit the patient was educated and counseled about appropriate screening and preventive services including : fall prevention , diabetes screening, nutrition counseling, colorectal cancer screening, and recommended immunizations.    Patient is also concerned today about a nodular area on right lower chest wall. She reports this has been present for over one year. It is nontender. It does not seem to be changing in size. She is also concerned about new nodule in her left breast. This is been present for a couple of months. She feels that it might be increasing in size. It is not tender. She has refused mammogram in the past because of pain associated with the exam.  Outpatient Encounter Prescriptions as of 10/02/2012  Medication Sig Dispense Refill  . Acetaminophen (ARTHRITIS PAIN RELIEF PO) Take 650 mg by mouth as needed.      [name] amLODipine-benazepril (LOTREL) 5-20 MG per capsule Take 1 capsule by mouth daily.  90 capsule  4  . aspirin (BAYER ASPIRIN) 325 MG tablet Take 325 mg by mouth daily.      . Cyanocobalamin (VITAMIN B-12) 2500 MCG SUBL Place 2,500 mcg under the tongue daily.      [name] glimepiride (AMARYL) 2 MG tablet Take 1 tablet (2 mg total) by mouth 2 (two) times daily.  180 tablet  4  . glucose blood (GLUCOLAB TEST) test strip Use  as instructed  100 each  12  . metFORMIN (GLUCOPHAGE) 1000 MG tablet Take 0.5 tablets (500 mg total) by mouth 2 (two) times daily.  180 tablet  4  . rosuvastatin (CRESTOR) 10 MG tablet Take 10 mg by mouth daily.      [name] azithromycin (ZITHROMAX Z-PAK) 250 MG tablet Take 2 pills day 1, then 1 pill daily days 2-5  6 each  0  . benzonatate (TESSALON) 200 MG capsule Take 1 capsule (200 mg total) by mouth 2 (two) times daily as needed for cough.   20 capsule  0  . cyanocobalamin 2000 MCG tablet Take by mouth daily.       [name] guaiFENesin-codeine (ROBITUSSIN AC) 100-10 MG/5ML syrup Take 5 mLs by mouth 2 (two) times daily as needed for cough.  240 mL  0   No facility-administered encounter medications on file as of 10/02/2012.   BP 160/70  Pulse 68  Temp(Src) 98 F (36.7 C) (Oral)  Ht 5\' 3"  (1.6 m)  Wt 179 lb (81.194 kg)  BMI 31.72 kg/m2  SpO2 97%   Review of Systems  Constitutional: Negative for fever, chills, appetite change, fatigue and unexpected weight change.  HENT: Negative for ear pain, congestion, sore throat, trouble swallowing, neck pain, voice change and sinus pressure.   Eyes: Negative for visual disturbance.  Respiratory: Negative for cough, shortness of breath, wheezing and stridor.   Cardiovascular: Negative for chest pain, palpitations and leg swelling.  Gastrointestinal: Negative for nausea, vomiting, abdominal pain, diarrhea, constipation, blood in stool, abdominal distention and anal bleeding.  Genitourinary: Negative for dysuria and flank pain.  Musculoskeletal: Negative for myalgias, arthralgias and gait problem.  Skin: Negative for color change and rash.  Neurological: Negative for dizziness and headaches.  Hematological: Negative for adenopathy. Does not bruise/bleed easily.  Psychiatric/Behavioral: Negative for suicidal ideas, sleep disturbance and dysphoric mood. The patient is not nervous/anxious.        Objective:   Physical Exam  Constitutional: She is oriented to person, place, and time. She appears well-developed and well-nourished. No distress.  HENT:  Head: Normocephalic and atraumatic.  Right Ear: External ear normal.  Left Ear: External ear normal.  Nose: Nose normal.  Mouth/Throat: Oropharynx is clear and moist. No oropharyngeal exudate.  Eyes: Conjunctivae are normal. Pupils are equal, round, and reactive to light. Right eye exhibits no discharge. Left eye exhibits no discharge. No  scleral icterus.  Neck: Normal range of motion. Neck supple. No tracheal deviation present. No thyromegaly present.  Cardiovascular: Normal rate, regular rhythm, normal heart sounds and intact distal pulses.  Exam reveals no gallop and no friction rub.   No murmur heard. Pulmonary/Chest: Effort normal and breath sounds normal. No accessory muscle usage. Not tachypneic. No respiratory distress. She has no decreased breath sounds. She has no wheezes. She has no rhonchi. She has no rales. She exhibits no tenderness. Right breast exhibits mass. Right breast exhibits no inverted nipple, no nipple discharge, no skin change and no tenderness. Left breast exhibits mass. Left breast exhibits no inverted nipple, no nipple discharge, no skin change and no tenderness. Breasts are symmetrical.    Abdominal: Soft. Bowel sounds are normal. She exhibits no distension. There is no tenderness. There is no rebound and no guarding.  Musculoskeletal: Normal range of motion. She exhibits no edema and no tenderness.  Lymphadenopathy:    She has no cervical adenopathy.  Neurological: She is alert and oriented to person, place, and time. No cranial nerve deficit. She  exhibits normal muscle tone. Coordination normal.  Skin: Skin is warm and dry. No rash noted. She is not diaphoretic. No erythema. No pallor.  Psychiatric: She has a normal mood and affect. Her behavior is normal. Judgment and thought content normal.          Assessment & Plan:

## 2012-10-02 NOTE — Assessment & Plan Note (Signed)
Will check lipids with labs today. Continue Crestor.

## 2012-10-02 NOTE — Assessment & Plan Note (Signed)
BP Readings from Last 3 Encounters:  10/02/12 160/70  06/09/12 148/80  04/03/12 128/64   BP slightly elevated today, but pt reports has been well controlled at home. Will check renal function and urine microalbumin with labs. Continue Lotrel.

## 2012-10-02 NOTE — Assessment & Plan Note (Signed)
General medical exam normal today except as noted with palpable breast mass. Will set up diagnostic mammogram and breast US. Other health maintenance UTD except for pneumonia vaccine which pt declines. Will check labs including CMP, CBC, lipids, A1c.

## 2012-10-02 NOTE — Assessment & Plan Note (Signed)
Exam is most consistent with lipoma. CXR showed no abnormal rib findings. As above, will get mammogram and US breasts for further evaluation.

## 2012-10-05 ENCOUNTER — Telehealth: Payer: Self-pay | Admitting: Internal Medicine

## 2012-10-05 DIAGNOSIS — N63 Unspecified lump in unspecified breast: Secondary | ICD-10-CM

## 2012-10-05 NOTE — Telephone Encounter (Signed)
Left message to call back  

## 2012-10-05 NOTE — Telephone Encounter (Signed)
Mammogram was abnormal, showing nodular areas consistent with the exam that I performed in clinic. Per notes, this was discussed with patient during her exam. I have placed a referral to general surgery, Dr. Bary Castilla. Please make sure that this is okay with her. We should also make a follow up in a few weeks here after she sees Dr. Bary Castilla.

## 2012-10-06 ENCOUNTER — Telehealth: Payer: Self-pay | Admitting: Internal Medicine

## 2012-10-06 NOTE — Telephone Encounter (Signed)
Pt returned your call.  

## 2012-10-09 ENCOUNTER — Telehealth: Payer: Self-pay | Admitting: *Deleted

## 2012-10-09 DIAGNOSIS — R222 Localized swelling, mass and lump, trunk: Secondary | ICD-10-CM

## 2012-10-09 NOTE — Telephone Encounter (Signed)
Informed patient of results

## 2012-10-09 NOTE — Telephone Encounter (Signed)
Amber, Can you schedule?

## 2012-10-09 NOTE — Telephone Encounter (Signed)
Patient was informed of xray results and would like to proceed with CT. However she can not go until next week and the 17th she is not available.

## 2012-10-09 NOTE — Telephone Encounter (Signed)
Left message to call back  

## 2012-10-09 NOTE — Telephone Encounter (Signed)
Message copied by Ronaldo Miyamoto on Mon Oct 09, 2012 11:14 AM ------      Message from: Ronette Deter A      Created: Mon Oct 02, 2012 11:17 AM       No abnormalities were seen on xray. The radiologist recommended to proceed with CT chest for further evaluation. If pt would like, we can set this up. ------

## 2012-10-10 NOTE — Telephone Encounter (Signed)
Left message for patient to call back to schedule follow up appointment with Dr. Gilford Rile

## 2012-10-10 NOTE — Telephone Encounter (Signed)
Patient returned call and appointment was scheduled.

## 2012-10-11 ENCOUNTER — Telehealth: Payer: Self-pay | Admitting: *Deleted

## 2012-10-11 NOTE — Telephone Encounter (Signed)
Received a Fax from [place]: CPT [phone]- CAT scan of the chest without dye, has been APPROVED

## 2012-10-16 ENCOUNTER — Encounter: Payer: Self-pay | Admitting: General Surgery

## 2012-10-16 ENCOUNTER — Ambulatory Visit (INDEPENDENT_AMBULATORY_CARE_PROVIDER_SITE_OTHER): Payer: Medicare Other | Admitting: General Surgery

## 2012-10-16 ENCOUNTER — Ambulatory Visit
Admission: RE | Admit: 2012-10-16 | Discharge: 2012-10-16 | Disposition: A | Discharge status: Home / Self Care | Payer: Medicare Other | Source: Ambulatory Visit | Attending: Internal Medicine | Admitting: Internal Medicine

## 2012-10-16 ENCOUNTER — Other Ambulatory Visit: Payer: Self-pay | Admitting: Internal Medicine

## 2012-10-16 ENCOUNTER — Encounter: Payer: Self-pay | Admitting: *Deleted

## 2012-10-16 VITALS — BP 168/82 | HR 80 | Resp 14 | Ht 62.5 in | Wt 176.0 lb

## 2012-10-16 DIAGNOSIS — R222 Localized swelling, mass and lump, trunk: Secondary | ICD-10-CM

## 2012-10-16 DIAGNOSIS — R928 Other abnormal and inconclusive findings on diagnostic imaging of breast: Secondary | ICD-10-CM

## 2012-10-16 DIAGNOSIS — N63 Unspecified lump in unspecified breast: Secondary | ICD-10-CM | POA: Insufficient documentation

## 2012-10-16 NOTE — Patient Instructions (Addendum)
Patient advised to have core breast biopsy x 3 spots once she has her chest CT scan done. She is advised to bring results of CT scan to our office. Patient is advised of what the biopsy will entail.

## 2012-10-16 NOTE — Progress Notes (Signed)
Patient ID: Kelsey Mason, female   DOB: March 26, 1941, 72 y.o.   MRN: [id number]  Chief Complaint  Patient presents with  . New Patient Breast    Cat 4 mammogram    HPI  Kelsey Mason is a 72 y.o. female here today following up from here mammogram done on 10/02/12. This is her first ever mammogram.The patient states she found left breast nodule in August 2013 that has gotten larger. She states she has some pain occasionally. Her mother has a history of breast cancer with double mastectomy diagnosed in 28's. No other known family history of breast cancer. No personal history of breast problems.  HPI  Past Medical History  Diagnosis Date  . Diabetes mellitus   . Hypertension   . Breast symptom 2014    Past Surgical History  Procedure Laterality Date  . Cholecystectomy  2007  . Abdominal hysterectomy  1976    for pelvic pain  . Tumor removal Right 1980    right foot  . Oophorectomy Right     Family History  Problem Relation Age of Onset  . Alcohol abuse Mother   . Heart disease Mother   . Hypertension Mother   . Alcohol abuse Father   . Heart disease Father   . Hypertension Father   . Heart disease Brother     Social History History  Substance Use Topics  . Smoking status: Former Smoker -- 0.25 packs/day for 1 years    Types: Cigarettes  . Smokeless tobacco: Never Used  . Alcohol Use: No    Allergies  Allergen Reactions  . Advil (Ibuprofen) Other (See Comments)    Dizziness   . Aleve (Naproxen) Other (See Comments)    Dizziness  . Lipitor (Atorvastatin) Other (See Comments)    Leg pain, muscle ache  . Penicillins Hives    Current Outpatient Prescriptions  Medication Sig Dispense Refill  . Acetaminophen (ARTHRITIS PAIN RELIEF PO) Take 650 mg by mouth as needed.      [name] amLODipine-benazepril (LOTREL) 5-20 MG per capsule Take 1 capsule by mouth daily.  90 capsule  4  . aspirin 81 MG tablet Take 81 mg by mouth daily.      . Cyanocobalamin (VITAMIN  B-12) 2500 MCG SUBL Place 2,500 mcg under the tongue daily.      . cyanocobalamin 2000 MCG tablet Take by mouth daily.       [name] glimepiride (AMARYL) 2 MG tablet Take 1 tablet (2 mg total) by mouth 2 (two) times daily.  180 tablet  4  . glucose blood (GLUCOLAB TEST) test strip Use as instructed  100 each  12  . metFORMIN (GLUCOPHAGE) 1000 MG tablet Take 0.5 tablets (500 mg total) by mouth 2 (two) times daily.  180 tablet  4  . rosuvastatin (CRESTOR) 10 MG tablet Take 1 tablet (10 mg total) by mouth daily.  90 tablet  4   No current facility-administered medications for this visit.    Review of Systems Review of Systems  Constitutional: Negative.   Respiratory: Negative.   Cardiovascular: Negative.     Blood pressure 168/82, pulse 80, resp. rate 14, height 5' 2.5" (1.588 m), weight 176 lb (79.833 kg).  Physical Exam Physical Exam  Constitutional: She appears well-developed and well-nourished.  Eyes: Conjunctivae are normal. No scleral icterus.  Neck: Trachea normal. No mass and no thyromegaly present.  Cardiovascular: Normal rate, regular rhythm, normal heart sounds and normal pulses.   No murmur heard. Pulmonary/Chest: Effort normal  and breath sounds normal. Right breast exhibits no inverted nipple, no mass, no nipple discharge, no skin change and no tenderness. Left breast exhibits mass. Left breast exhibits no inverted nipple, no nipple discharge, no skin change and no tenderness. Breasts are symmetrical.    Abdominal: Soft. Normal appearance and bowel sounds are normal. There is no hepatosplenomegaly. There is no tenderness. No hernia.  Lymphadenopathy:    She has no cervical adenopathy.    She has axillary adenopathy.  Mid axillary area has a firm 2 cm mobile node right side.     Data Reviewed Her films were not available today. Mammogram and [place] reports reviewed.    Assessment    Pt has an ill defined left breast mass that needs core biopsy. Imaging reportedly showing 2  masses in right breast also.     Plan    Need to look at the mammogram and [place] myself. Have asked for these films. After review will plan for core biopsies of the identified masses.    Pt has a CT scan of chest scheduled fo later today-requested by Dr. Ronette Deter. Would like to look at this also.    Kelsey Mason 10/18/2012, 8:14 AM

## 2012-10-18 ENCOUNTER — Encounter: Payer: Self-pay | Admitting: General Surgery

## 2012-10-19 ENCOUNTER — Encounter: Payer: Self-pay | Admitting: Internal Medicine

## 2012-10-19 ENCOUNTER — Ambulatory Visit (INDEPENDENT_AMBULATORY_CARE_PROVIDER_SITE_OTHER): Payer: 59 | Admitting: Internal Medicine

## 2012-10-19 VITALS — BP 140/60 | HR 79 | Temp 98.2°F | Wt 178.0 lb

## 2012-10-19 DIAGNOSIS — R141 Gas pain: Secondary | ICD-10-CM

## 2012-10-19 DIAGNOSIS — N63 Unspecified lump in unspecified breast: Secondary | ICD-10-CM

## 2012-10-19 DIAGNOSIS — R222 Localized swelling, mass and lump, trunk: Secondary | ICD-10-CM

## 2012-10-19 DIAGNOSIS — R143 Flatulence: Secondary | ICD-10-CM

## 2012-10-19 DIAGNOSIS — R928 Other abnormal and inconclusive findings on diagnostic imaging of breast: Secondary | ICD-10-CM

## 2012-10-19 DIAGNOSIS — R140 Abdominal distension (gaseous): Secondary | ICD-10-CM | POA: Insufficient documentation

## 2012-10-19 DIAGNOSIS — R6881 Early satiety: Secondary | ICD-10-CM | POA: Insufficient documentation

## 2012-10-19 NOTE — Assessment & Plan Note (Signed)
Patient reports early satiety and abdominal distention. Exam is remarkable for tenderness in the epigastric area. As above, will get. Swallow. Will also consider CT of the abdomen based on findings from barium swallow. We also discussed referral to GI for upper endoscopy depending on the results of these tests.

## 2012-10-19 NOTE — Assessment & Plan Note (Signed)
Fullness noted in the chest wall on exam was not observed on CT of the chest. Suspect this represents increased fatty tissue. Will continue to monitor.

## 2012-10-19 NOTE — Assessment & Plan Note (Signed)
Patient reports early satiety and abdominal distention. She has nausea but no vomiting. Exam is remarkable for mild tenderness in epigastric area. Will get barium swallow for further evaluation. Question if she may have gastroparesis given her history of diabetes. Followup in 4 weeks or sooner if needed.

## 2012-10-19 NOTE — Assessment & Plan Note (Signed)
Recent diagnostic mammogram and ultrasound confirmed bilateral breast mass. Patient is scheduled for biopsy next week. Will follow.

## 2012-10-19 NOTE — Progress Notes (Signed)
Subjective:    Patient ID: Kelsey Mason, female    DOB: [birth year], 72 y.o.   MRN: [id number]  HPI 72 year old female with history of diabetes, hypertension, hyperlipidemia presents for followup. At her last visit, she was noted to have palpable left breast mass. She was sent for diagnostic mammogram which showed bilateral breast masses. She is scheduled for biopsy next week. She was also concerned about some fullness in her lower chest wall anteriorly. CT of the chest showed no acute findings in this location.  She is concerned about several week history of worsening abdominal distention and early satiety. She reports that she finishes a small portion of her meal and then can eat no further because of abdominal distention and nausea. She has not had any vomiting. She denies changes in her bowel movements. She denies fever or chills. She denies belching. She has not taken any medication for this.  Outpatient Encounter Prescriptions as of 10/19/2012  Medication Sig Dispense Refill  . Acetaminophen (ARTHRITIS PAIN RELIEF PO) Take 650 mg by mouth as needed.      [name] amLODipine-benazepril (LOTREL) 5-20 MG per capsule Take 1 capsule by mouth daily.  90 capsule  4  . aspirin 81 MG tablet Take 81 mg by mouth daily.      . Cyanocobalamin (VITAMIN B-12) 2500 MCG SUBL Place 2,500 mcg under the tongue daily.      . cyanocobalamin 2000 MCG tablet Take by mouth daily.       [name] glimepiride (AMARYL) 2 MG tablet Take 1 tablet (2 mg total) by mouth 2 (two) times daily.  180 tablet  4  . glucose blood (GLUCOLAB TEST) test strip Use as instructed  100 each  12  . metFORMIN (GLUCOPHAGE) 1000 MG tablet Take 0.5 tablets (500 mg total) by mouth 2 (two) times daily.  180 tablet  4  . rosuvastatin (CRESTOR) 10 MG tablet Take 1 tablet (10 mg total) by mouth daily.  90 tablet  4   No facility-administered encounter medications on file as of 10/19/2012.   BP 140/60  Pulse 79  Temp(Src) 98.2 F (36.8 C) (Oral)  Wt  178 lb (80.74 kg)  BMI 32.02 kg/m2  SpO2 97%  Review of Systems  Constitutional: Negative for fever, chills, appetite change, fatigue and unexpected weight change.  HENT: Negative for ear pain, congestion, sore throat, trouble swallowing, neck pain, voice change and sinus pressure.   Eyes: Negative for visual disturbance.  Respiratory: Negative for cough, shortness of breath, wheezing and stridor.   Cardiovascular: Negative for chest pain, palpitations and leg swelling.  Gastrointestinal: Positive for nausea and abdominal distention. Negative for vomiting, abdominal pain, diarrhea, constipation, blood in stool and anal bleeding.  Genitourinary: Negative for dysuria and flank pain.  Musculoskeletal: Negative for myalgias, arthralgias and gait problem.  Skin: Negative for color change and rash.  Neurological: Negative for dizziness and headaches.  Hematological: Negative for adenopathy. Does not bruise/bleed easily.  Psychiatric/Behavioral: Negative for suicidal ideas, sleep disturbance and dysphoric mood. The patient is not nervous/anxious.        Objective:   Physical Exam  Constitutional: She is oriented to person, place, and time. She appears well-developed and well-nourished. No distress.  HENT:  Head: Normocephalic and atraumatic.  Right Ear: External ear normal.  Left Ear: External ear normal.  Nose: Nose normal.  Mouth/Throat: Oropharynx is clear and moist. No oropharyngeal exudate.  Eyes: Conjunctivae are normal. Pupils are equal, round, and reactive to light. Right eye exhibits no  discharge. Left eye exhibits no discharge. No scleral icterus.  Neck: Normal range of motion. Neck supple. No tracheal deviation present. No thyromegaly present.  Cardiovascular: Normal rate, regular rhythm, normal heart sounds and intact distal pulses.  Exam reveals no gallop and no friction rub.   No murmur heard. Pulmonary/Chest: Effort normal and breath sounds normal. No accessory muscle usage.  Not tachypneic. No respiratory distress. She has no decreased breath sounds. She has no wheezes. She has no rhonchi. She has no rales. She exhibits no tenderness.  Abdominal: Soft. Bowel sounds are normal. She exhibits distension. She exhibits no mass. There is tenderness (epigastric area). There is no rebound and no guarding.  Musculoskeletal: Normal range of motion. She exhibits no edema and no tenderness.  Lymphadenopathy:    She has no cervical adenopathy.  Neurological: She is alert and oriented to person, place, and time. No cranial nerve deficit. She exhibits normal muscle tone. Coordination normal.  Skin: Skin is warm and dry. No rash noted. She is not diaphoretic. No erythema. No pallor.  Psychiatric: She has a normal mood and affect. Her behavior is normal. Judgment and thought content normal.          Assessment & Plan:

## 2012-10-24 ENCOUNTER — Encounter: Payer: Self-pay | Admitting: General Surgery

## 2012-10-24 ENCOUNTER — Ambulatory Visit (INDEPENDENT_AMBULATORY_CARE_PROVIDER_SITE_OTHER): Payer: Medicare Other | Admitting: General Surgery

## 2012-10-24 VITALS — BP 160/72 | HR 76 | Resp 14 | Ht 62.0 in | Wt 177.0 lb

## 2012-10-24 DIAGNOSIS — N63 Unspecified lump in unspecified breast: Secondary | ICD-10-CM

## 2012-10-24 NOTE — Patient Instructions (Addendum)
CARE AFTER BREAST BIOPSY  1. Leave the dressing on that your doctor applied after surgery. It is waterproof. You may bathe, shower and/or swim. The dressing will probably remain intact until your return office visit. If the dressing comes off, you will see small strips of tape against your skin on the incision. Do not remove these strips.  2. You may want to use a gauze,cloth or similar protection in your bra to prevent rubbing against your dressing and incision. This is not necessary, but you may feel more comfortable doing so.  3. It is recommended that you wear a bra day and night to give support to the breast. This will prevent the weight of the breast from pulling on the incision.  4. Your breast will feel hard and lumpy under the incision. Do not be alarmed. This is the underlying stitching of tissue. Softening of this tissue will occur in time.  5. Make sure you call the office and schedule an appointment in one week after your surgery. The office phone number is [phone]. The nurses at [facility] may have already done this for you.  6. You will notice about a week after your office visit that the strips of the tape on your incision will begin to loosen. These may then be removed.  7. Report to your doctor any of the following:  * Severe pain not relieved by your pain medication  *Redness of the incision  * Drainage from the incision  *Fever greater than 101 degrees  Patient to have a bilateral diagnostic mammogram at [facility] on 12-28-12 at 2:30 pm. She is aware of date, time, and instructions.  Pathology report will be relayed to her when available.

## 2012-10-24 NOTE — Progress Notes (Signed)
Patient ID: Kelsey Mason, female   DOB: [birth year], 72 y.o.   MRN: [id number]  Chief Complaint  Patient presents with  . Other    breast biopsy    HPI Kelsey Mason is a 72 y.o. female here today for her breast Biopsys. HPI  Past Medical History  Diagnosis Date  . Diabetes mellitus   . Hypertension   . Breast symptom 2014    Past Surgical History  Procedure Laterality Date  . Cholecystectomy  2007  . Abdominal hysterectomy  1976    for pelvic pain  . Tumor removal Right 1980    right foot  . Oophorectomy Right     Family History  Problem Relation Age of Onset  . Alcohol abuse Mother   . Heart disease Mother   . Hypertension Mother   . Alcohol abuse Father   . Heart disease Father   . Hypertension Father   . Heart disease Brother     Social History History  Substance Use Topics  . Smoking status: Former Smoker -- 0.25 packs/day for 1 years    Types: Cigarettes  . Smokeless tobacco: Never Used  . Alcohol Use: No    Allergies  Allergen Reactions  . Advil (Ibuprofen) Other (See Comments)    Dizziness   . Aleve (Naproxen) Other (See Comments)    Dizziness  . Lipitor (Atorvastatin) Other (See Comments)    Leg pain, muscle ache  . Penicillins Hives    Current Outpatient Prescriptions  Medication Sig Dispense Refill  . Acetaminophen (ARTHRITIS PAIN RELIEF PO) Take 650 mg by mouth as needed.      [name] amLODipine-benazepril (LOTREL) 5-20 MG per capsule Take 1 capsule by mouth daily.  90 capsule  4  . aspirin 81 MG tablet Take 81 mg by mouth daily.      . Cyanocobalamin (VITAMIN B-12) 2500 MCG SUBL Place 2,500 mcg under the tongue daily.      . cyanocobalamin 2000 MCG tablet Take by mouth daily.       [name] glimepiride (AMARYL) 2 MG tablet Take 1 tablet (2 mg total) by mouth 2 (two) times daily.  180 tablet  4  . glucose blood (GLUCOLAB TEST) test strip Use as instructed  100 each  12  . metFORMIN (GLUCOPHAGE) 1000 MG tablet Take 0.5 tablets (500 mg  total) by mouth 2 (two) times daily.  180 tablet  4  . rosuvastatin (CRESTOR) 10 MG tablet Take 1 tablet (10 mg total) by mouth daily.  90 tablet  4   No current facility-administered medications for this visit.    Review of Systems Review of Systems  Constitutional: Negative.   Respiratory: Negative.   Cardiovascular: Negative.     Blood pressure 160/72, pulse 76, resp. rate 14, height 5\' 2"  (1.575 m), weight 177 lb (80.287 kg).  Physical Exam Physical Exam  Data Reviewed [place] was performed and co0re biopsy of left breast mass at 9o'cl and right breast at 10 o'cl performed with clips placed. No definite mass seen at 2 o'cl as noted by prior US at [facility]   Assessment    Bilateral breast masses     Plan          Patient to have a bilateral diagnostic mammogram at [facility] on 12-28-12 at 2:30 pm. She is aware of date, time, and instructions. Office visit follow up to be arranged after mammogram.   Junie Panning G 10/24/2012, 11:02 AM

## 2012-10-25 ENCOUNTER — Telehealth: Payer: Self-pay | Admitting: *Deleted

## 2012-10-25 DIAGNOSIS — C50919 Malignant neoplasm of unspecified site of unspecified female breast: Secondary | ICD-10-CM

## 2012-10-25 NOTE — Telephone Encounter (Signed)
Patient to come in for discussion on 10-26-12 at 4:30 pm. MRI to be arranged.

## 2012-10-25 NOTE — Telephone Encounter (Signed)
Message copied by Dominga Ferry on Wed Oct 25, 2012  1:38 PM ------      Message from: Christene Lye      Created: Wed Oct 25, 2012 12:16 PM       Path results noted and pt informed by telephone. Given bilateral cancers, possible third one in right breast will get MRI done. Pt is agreeable to this.       MRI to be scheduled and pt to come tomorrow for discussion. ------

## 2012-10-25 NOTE — Telephone Encounter (Signed)
Patient contacted today to arrange for office discussion. This patient will come in on 10-26-12 at 4:30 pm. She is aware of date, time, and instructions. We will be arranging for a bilateral breast MRI at the [facility].   Dr. Jamal Collin also requested we cancel mammogram that was scheduled at [facility] for 12-28-12 and also cancel office visit follow up for July 2014. Debbie at [employer]. Patient will be informed of this at appointment tomorrow.

## 2012-10-26 ENCOUNTER — Ambulatory Visit: Payer: Self-pay | Admitting: Internal Medicine

## 2012-10-26 ENCOUNTER — Ambulatory Visit (INDEPENDENT_AMBULATORY_CARE_PROVIDER_SITE_OTHER): Payer: Medicare Other | Admitting: General Surgery

## 2012-10-26 ENCOUNTER — Encounter: Payer: Self-pay | Admitting: General Surgery

## 2012-10-26 VITALS — BP 172/70 | HR 82 | Resp 14 | Ht 62.0 in | Wt 179.0 lb

## 2012-10-26 DIAGNOSIS — D0590 Unspecified type of carcinoma in situ of unspecified breast: Secondary | ICD-10-CM

## 2012-10-26 DIAGNOSIS — D0592 Unspecified type of carcinoma in situ of left breast: Secondary | ICD-10-CM

## 2012-10-26 DIAGNOSIS — C50411 Malignant neoplasm of upper-outer quadrant of right female breast: Secondary | ICD-10-CM

## 2012-10-26 DIAGNOSIS — C50419 Malignant neoplasm of upper-outer quadrant of unspecified female breast: Secondary | ICD-10-CM

## 2012-10-27 ENCOUNTER — Encounter: Payer: Self-pay | Admitting: General Surgery

## 2012-10-27 ENCOUNTER — Telehealth: Payer: Self-pay | Admitting: Internal Medicine

## 2012-10-27 DIAGNOSIS — D0590 Unspecified type of carcinoma in situ of unspecified breast: Secondary | ICD-10-CM | POA: Insufficient documentation

## 2012-10-27 DIAGNOSIS — Z853 Personal history of malignant neoplasm of breast: Secondary | ICD-10-CM | POA: Insufficient documentation

## 2012-10-27 NOTE — Telephone Encounter (Signed)
Left message to call back  

## 2012-10-27 NOTE — Telephone Encounter (Signed)
Upper GI showed severe gastroesophageal reflux. I would recommend that we set up visit with GI physician for evaluation (unless this has already been scheduled).

## 2012-10-27 NOTE — Patient Instructions (Signed)
MRI to be scheduled

## 2012-10-27 NOTE — Progress Notes (Signed)
Patient ID: Kelsey Mason, female   DOB: 1941/01/06, 72 y.o.   MRN: [id number]  Pt was here for discussion.Pathology report on core biopsies-left side with DCIS and right side with invasive mammary cancer. Patient was explained in full about the reports. There is a questionable third area in right breast on US imaging-was not well seen to biopsy. ER/PR and Her 2 are being processed.  Given bilateral involvement feel MRI would be useful. This is to be scheduled. Pt is amenable to either bilateral lumpectomy or mastectomy. She does not have any interest in reconstruction. Will see her back after MRI completed.

## 2012-10-30 NOTE — Telephone Encounter (Signed)
Patient informed but would like to hold off on referral to GI specialist. Per patient she will call back and let us know after her MRI.

## 2012-11-01 LAB — PATHOLOGY

## 2012-11-01 LAB — HM MAMMOGRAPHY

## 2012-11-02 ENCOUNTER — Ambulatory Visit
Admission: RE | Admit: 2012-11-02 | Discharge: 2012-11-02 | Disposition: A | Discharge status: Home / Self Care | Payer: PRIVATE HEALTH INSURANCE | Source: Ambulatory Visit | Attending: General Surgery | Admitting: General Surgery

## 2012-11-02 DIAGNOSIS — C50919 Malignant neoplasm of unspecified site of unspecified female breast: Secondary | ICD-10-CM

## 2012-11-02 HISTORY — PX: BREAST EXCISIONAL BIOPSY: SUR124

## 2012-11-02 MED ORDER — GADOBENATE DIMEGLUMINE 529 MG/ML IV SOLN
16.0000 mL | Freq: Once | INTRAVENOUS | Status: AC | PRN
  Administered 2012-11-02: 16 mL via INTRAVENOUS

## 2012-11-08 ENCOUNTER — Encounter: Payer: Self-pay | Admitting: General Surgery

## 2012-11-09 ENCOUNTER — Ambulatory Visit (INDEPENDENT_AMBULATORY_CARE_PROVIDER_SITE_OTHER): Payer: Medicare Other | Admitting: General Surgery

## 2012-11-09 ENCOUNTER — Encounter: Payer: Self-pay | Admitting: General Surgery

## 2012-11-09 VITALS — BP 160/78 | HR 74 | Resp 14 | Ht 66.0 in | Wt 178.0 lb

## 2012-11-09 DIAGNOSIS — D0590 Unspecified type of carcinoma in situ of unspecified breast: Secondary | ICD-10-CM

## 2012-11-09 DIAGNOSIS — D0592 Unspecified type of carcinoma in situ of left breast: Secondary | ICD-10-CM

## 2012-11-09 DIAGNOSIS — C50411 Malignant neoplasm of upper-outer quadrant of right female breast: Secondary | ICD-10-CM

## 2012-11-09 DIAGNOSIS — C50419 Malignant neoplasm of upper-outer quadrant of unspecified female breast: Secondary | ICD-10-CM

## 2012-11-09 NOTE — Patient Instructions (Addendum)
Patient's surgery to be arranged.

## 2012-11-09 NOTE — Progress Notes (Signed)
Patient ID: Kelsey Mason, female   DOB: [birth year], 72 y.o.   MRN: [id number]  Chief Complaint  Patient presents with  . Follow-up    Breast MRI    HPI Kelsey Mason is a 72 y.o. female here for a follow up for bilateral breast MRI. HPI  Past Medical History  Diagnosis Date  . Diabetes mellitus   . Hypertension   . Breast symptom 2014    Past Surgical History  Procedure Laterality Date  . Cholecystectomy  2007  . Abdominal hysterectomy  1976    for pelvic pain  . Tumor removal Right 1980    right foot  . Oophorectomy Right   . Breast surgery Bilateral 2014    Family History  Problem Relation Age of Onset  . Alcohol abuse Mother   . Heart disease Mother   . Hypertension Mother   . Alcohol abuse Father   . Heart disease Father   . Hypertension Father   . Heart disease Brother     Social History History  Substance Use Topics  . Smoking status: Former Smoker -- 0.25 packs/day for 1 years    Types: Cigarettes  . Smokeless tobacco: Never Used  . Alcohol Use: No    Allergies  Allergen Reactions  . Advil (Ibuprofen) Other (See Comments)    Dizziness   . Aleve (Naproxen) Other (See Comments)    Dizziness  . Lipitor (Atorvastatin) Other (See Comments)    Leg pain, muscle ache  . Penicillins Hives    Current Outpatient Prescriptions  Medication Sig Dispense Refill  . Acetaminophen (ARTHRITIS PAIN RELIEF PO) Take 650 mg by mouth as needed.      [name] amLODipine-benazepril (LOTREL) 5-20 MG per capsule Take 1 capsule by mouth daily.  90 capsule  4  . aspirin 81 MG tablet Take 81 mg by mouth daily.      . Cyanocobalamin (VITAMIN B-12) 2500 MCG SUBL Place 2,500 mcg under the tongue daily.      . cyanocobalamin 2000 MCG tablet Take by mouth daily.       [name] glimepiride (AMARYL) 2 MG tablet Take 1 tablet (2 mg total) by mouth 2 (two) times daily.  180 tablet  4  . glucose blood (GLUCOLAB TEST) test strip Use as instructed  100 each  12  . metFORMIN  (GLUCOPHAGE) 1000 MG tablet Take 0.5 tablets (500 mg total) by mouth 2 (two) times daily.  180 tablet  4  . rosuvastatin (CRESTOR) 10 MG tablet Take 1 tablet (10 mg total) by mouth daily.  90 tablet  4   No current facility-administered medications for this visit.    Review of Systems Review of Systems  Constitutional: Negative.   HENT: Negative.   Respiratory: Negative.   Cardiovascular: Negative.   Gastrointestinal: Negative.     Blood pressure 160/78, pulse 74, resp. rate 14, height 5\' 6"  (1.676 m), weight 178 lb (80.74 kg).  Physical Exam Physical Exam  Data Reviewed MRI of breasts showed only the two areas with biopsied cancer. Prior US suggested a possible mass in superior rt breast buth thies was not seen on [place] here at time of core biopsy.   Assessment    Rt breast invasive Ca, ER/PR pos, Her 2 neg. . Left breast DCIS.     Plan    Discussed treatment options with pt. She is inclined to have bilateral lumpectomy with SN biopsy. Role of radiation afer, potential need for chemo if nodes involved explained to  pt.     Patient's surgery to be arranged.   Roald Lukacs G 11/09/2012, 9:52 PM

## 2012-11-10 ENCOUNTER — Ambulatory Visit: Payer: Medicare Other | Admitting: General Surgery

## 2012-11-10 ENCOUNTER — Encounter: Payer: Self-pay | Admitting: General Surgery

## 2012-11-10 NOTE — Progress Notes (Signed)
Patient ID: Kelsey Mason, female   DOB: July 21, 1940, 72 y.o.   MRN: [id number] Orders only encounter

## 2012-11-13 ENCOUNTER — Ambulatory Visit: Payer: Self-pay | Admitting: General Surgery

## 2012-11-13 LAB — COMPREHENSIVE METABOLIC PANEL
Alkaline Phosphatase: 86 U/L (ref 50–136)
Bilirubin,Total: 0.3 mg/dL (ref 0.2–1.0)
Chloride: 105 mmol/L (ref 98–107)
Co2: 26 mmol/L (ref 21–32)
EGFR (African American): 51 — ABNORMAL LOW
Glucose: 190 mg/dL — ABNORMAL HIGH (ref 65–99)
Osmolality: 281 (ref 275–301)
SGOT(AST): 21 U/L (ref 15–37)
Sodium: 136 mmol/L (ref 136–145)
Total Protein: 6.7 g/dL (ref 6.4–8.2)

## 2012-11-13 LAB — CBC WITH DIFFERENTIAL/PLATELET
Basophil #: 0.1 10*3/uL (ref 0.0–0.1)
Basophil %: 0.6 %
Eosinophil %: 6 %
HCT: 34.3 % — ABNORMAL LOW (ref 35.0–47.0)
HGB: 11.7 g/dL — ABNORMAL LOW (ref 12.0–16.0)
Lymphocyte #: 1.6 10*3/uL (ref 1.0–3.6)
Lymphocyte %: 16.7 %
MCV: 87 fL (ref 80–100)
Monocyte #: 0.6 x10 3/mm (ref 0.2–0.9)
Neutrophil #: 6.7 10*3/uL — ABNORMAL HIGH (ref 1.4–6.5)
Neutrophil %: 70.5 %
Platelet: 205 10*3/uL (ref 150–440)
RDW: 13 % (ref 11.5–14.5)

## 2012-11-14 ENCOUNTER — Telehealth: Payer: Self-pay | Admitting: Internal Medicine

## 2012-11-14 NOTE — Telephone Encounter (Signed)
We could move this visit out a bit, so that she has time to recover prior to follow up. Maybe 1-2 months out

## 2012-11-14 NOTE — Telephone Encounter (Signed)
Pt called stating that she is having surgery on 5/16 for Breast ca. She is wondering if her visit on 5/22 is really necessary, if so she will keep it. If not, please advise.    AB

## 2012-11-14 NOTE — Telephone Encounter (Signed)
Patient informed and already has an appointment scheduled in July.

## 2012-11-15 ENCOUNTER — Encounter: Payer: Self-pay | Admitting: General Surgery

## 2012-11-17 ENCOUNTER — Ambulatory Visit: Payer: Self-pay | Admitting: General Surgery

## 2012-11-17 DIAGNOSIS — D0590 Unspecified type of carcinoma in situ of unspecified breast: Secondary | ICD-10-CM

## 2012-11-17 DIAGNOSIS — C50419 Malignant neoplasm of upper-outer quadrant of unspecified female breast: Secondary | ICD-10-CM

## 2012-11-20 ENCOUNTER — Encounter: Payer: Self-pay | Admitting: Internal Medicine

## 2012-11-22 ENCOUNTER — Encounter: Payer: Self-pay | Admitting: General Surgery

## 2012-11-23 ENCOUNTER — Ambulatory Visit: Payer: 59 | Admitting: Internal Medicine

## 2012-11-28 ENCOUNTER — Encounter: Payer: Self-pay | Admitting: General Surgery

## 2012-11-29 ENCOUNTER — Encounter: Payer: Self-pay | Admitting: General Surgery

## 2012-11-29 ENCOUNTER — Ambulatory Visit: Payer: Self-pay | Admitting: Radiation Oncology

## 2012-11-29 ENCOUNTER — Ambulatory Visit (INDEPENDENT_AMBULATORY_CARE_PROVIDER_SITE_OTHER): Payer: Medicare Other | Admitting: General Surgery

## 2012-11-29 VITALS — BP 124/76 | HR 88 | Resp 12 | Ht 62.0 in | Wt 177.0 lb

## 2012-11-29 DIAGNOSIS — C50411 Malignant neoplasm of upper-outer quadrant of right female breast: Secondary | ICD-10-CM

## 2012-11-29 DIAGNOSIS — C50312 Malignant neoplasm of lower-inner quadrant of left female breast: Secondary | ICD-10-CM

## 2012-11-29 DIAGNOSIS — C50319 Malignant neoplasm of lower-inner quadrant of unspecified female breast: Secondary | ICD-10-CM | POA: Insufficient documentation

## 2012-11-29 DIAGNOSIS — C50419 Malignant neoplasm of upper-outer quadrant of unspecified female breast: Secondary | ICD-10-CM

## 2012-11-29 NOTE — Progress Notes (Signed)
Patient ID: Kelsey Mason, female   DOB: 1940-12-19, 72 y.o.   MRN: [id number] This is a 72 year old female following up from   blateral lumpectomy done on 11/17/12. Patient states no new breast problem. Bilateral breast incisions look clean and healing well.  This patient has been scheduled for an appointment with Dr. Noreene Filbert and Dr. Forest Gleason at the [facility] for 12-04-12 at 1 pm. She is aware of date, time, and instructions.   Path- right breast-21mm invasiive cancer, ER/PR pos, Her 2 neg. SN neg.           Left breast- 1.8cm invasive cancer, ER/PR pos, Her 2 pending, SN neg. Pt is advised fully on the pathology report. All margins were clear. Need for radiation therapy and possible chemotherapy and subsequent anti-hormonal therapy discussed in full with the patient.

## 2012-11-29 NOTE — Patient Instructions (Addendum)
Patient may return to work 12/04/12.   This patient has been scheduled for an appointment with Dr. Noreene Filbert and Dr. Forest Gleason at the [facility] for 12-04-12 at 1 pm.

## 2012-11-30 ENCOUNTER — Encounter: Payer: Self-pay | Admitting: General Surgery

## 2012-12-04 ENCOUNTER — Ambulatory Visit: Payer: Self-pay | Admitting: Oncology

## 2012-12-04 ENCOUNTER — Encounter: Payer: Self-pay | Admitting: General Surgery

## 2012-12-22 ENCOUNTER — Other Ambulatory Visit: Payer: Self-pay | Admitting: General Surgery

## 2012-12-22 DIAGNOSIS — C50312 Malignant neoplasm of lower-inner quadrant of left female breast: Secondary | ICD-10-CM

## 2012-12-22 DIAGNOSIS — C4330 Malignant melanoma of unspecified part of face: Secondary | ICD-10-CM

## 2012-12-25 ENCOUNTER — Ambulatory Visit: Payer: Medicare Other | Admitting: General Surgery

## 2012-12-27 ENCOUNTER — Ambulatory Visit: Payer: Medicare Other | Admitting: General Surgery

## 2013-01-01 ENCOUNTER — Encounter: Payer: Self-pay | Admitting: General Surgery

## 2013-01-01 ENCOUNTER — Ambulatory Visit: Payer: Self-pay | Admitting: General Surgery

## 2013-01-01 DIAGNOSIS — C50919 Malignant neoplasm of unspecified site of unspecified female breast: Secondary | ICD-10-CM

## 2013-01-02 ENCOUNTER — Ambulatory Visit: Payer: Self-pay | Admitting: Oncology

## 2013-01-04 ENCOUNTER — Ambulatory Visit: Payer: Medicare Other | Admitting: Internal Medicine

## 2013-01-04 ENCOUNTER — Ambulatory Visit: Payer: Medicare Other | Admitting: General Surgery

## 2013-01-09 LAB — CBC CANCER CENTER
HGB: 12.1 g/dL (ref 12.0–16.0)
MCH: 29.9 pg (ref 26.0–34.0)
MCV: 88 fL (ref 80–100)
Monocyte #: 0.5 x10 3/mm (ref 0.2–0.9)
Monocyte %: 5.9 %
Platelet: 176 x10 3/mm (ref 150–440)
RBC: 4.05 10*6/uL (ref 3.80–5.20)
WBC: 8.7 x10 3/mm (ref 3.6–11.0)

## 2013-01-09 LAB — COMPREHENSIVE METABOLIC PANEL
Albumin: 3.8 g/dL (ref 3.4–5.0)
Alkaline Phosphatase: 118 U/L (ref 50–136)
Anion Gap: 9 (ref 7–16)
Bilirubin,Total: 0.4 mg/dL (ref 0.2–1.0)
Calcium, Total: 9 mg/dL (ref 8.5–10.1)
Co2: 27 mmol/L (ref 21–32)
EGFR (Non-African Amer.): 39 — ABNORMAL LOW
Glucose: 339 mg/dL — ABNORMAL HIGH (ref 65–99)
Potassium: 4.2 mmol/L (ref 3.5–5.1)
SGOT(AST): 11 U/L — ABNORMAL LOW (ref 15–37)
SGPT (ALT): 33 U/L (ref 12–78)

## 2013-01-16 LAB — CBC CANCER CENTER
Basophil #: 0 x10 3/mm (ref 0.0–0.1)
Basophil %: 0.4 %
Eosinophil #: 0.3 x10 3/mm (ref 0.0–0.7)
Lymphocyte #: 0.6 x10 3/mm — ABNORMAL LOW (ref 1.0–3.6)
Lymphocyte %: 41.5 %
MCHC: 34.8 g/dL (ref 32.0–36.0)
Monocyte #: 0.1 x10 3/mm — ABNORMAL LOW (ref 0.2–0.9)
Monocyte %: 7.5 %
Neutrophil %: 29.8 %
Platelet: 114 x10 3/mm — ABNORMAL LOW (ref 150–440)
RBC: 4 10*6/uL (ref 3.80–5.20)
RDW: 12.9 % (ref 11.5–14.5)
WBC: 1.4 x10 3/mm — CL (ref 3.6–11.0)

## 2013-01-18 ENCOUNTER — Ambulatory Visit: Payer: Medicare Other | Admitting: General Surgery

## 2013-01-22 ENCOUNTER — Ambulatory Visit (INDEPENDENT_AMBULATORY_CARE_PROVIDER_SITE_OTHER): Payer: Medicare Other | Admitting: General Surgery

## 2013-01-22 ENCOUNTER — Encounter: Payer: Self-pay | Admitting: General Surgery

## 2013-01-22 VITALS — BP 120/82 | HR 82 | Resp 14 | Ht 62.0 in | Wt 174.0 lb

## 2013-01-22 DIAGNOSIS — C50319 Malignant neoplasm of lower-inner quadrant of unspecified female breast: Secondary | ICD-10-CM

## 2013-01-22 DIAGNOSIS — C50411 Malignant neoplasm of upper-outer quadrant of right female breast: Secondary | ICD-10-CM

## 2013-01-22 DIAGNOSIS — C50312 Malignant neoplasm of lower-inner quadrant of left female breast: Secondary | ICD-10-CM

## 2013-01-22 DIAGNOSIS — C50419 Malignant neoplasm of upper-outer quadrant of unspecified female breast: Secondary | ICD-10-CM

## 2013-01-22 NOTE — Patient Instructions (Addendum)
Patient to return in January 2015 with mammogram

## 2013-01-22 NOTE — Progress Notes (Signed)
This is an 72 year old female here today following up from port placement done on 01/01/13. Patients next treatment is 01/30/13. Port site well healed, no signs of infection.lungs are clear

## 2013-01-23 ENCOUNTER — Encounter: Payer: Self-pay | Admitting: General Surgery

## 2013-01-23 LAB — CBC CANCER CENTER
Basophil %: 0.8 %
Eosinophil #: 0.3 x10 3/mm (ref 0.0–0.7)
Eosinophil %: 1.1 %
HCT: 32.5 % — ABNORMAL LOW (ref 35.0–47.0)
Lymphocyte #: 1.5 x10 3/mm (ref 1.0–3.6)
Lymphocyte %: 6.5 %
MCHC: 34.3 g/dL (ref 32.0–36.0)
MCV: 87 fL (ref 80–100)
Monocyte #: 0.6 x10 3/mm (ref 0.2–0.9)
Monocyte %: 2.8 %
Neutrophil #: 20.1 x10 3/mm — ABNORMAL HIGH (ref 1.4–6.5)
RBC: 3.75 10*6/uL — ABNORMAL LOW (ref 3.80–5.20)
RDW: 12.9 % (ref 11.5–14.5)
WBC: 22.6 x10 3/mm — ABNORMAL HIGH (ref 3.6–11.0)

## 2013-01-30 LAB — CBC CANCER CENTER
Basophil #: 0.2 x10 3/mm — ABNORMAL HIGH (ref 0.0–0.1)
Eosinophil #: 0.3 x10 3/mm (ref 0.0–0.7)
Eosinophil %: 2.2 %
Lymphocyte #: 1.3 x10 3/mm (ref 1.0–3.6)
MCH: 30 pg (ref 26.0–34.0)
MCV: 86 fL (ref 80–100)
Neutrophil #: 10.2 x10 3/mm — ABNORMAL HIGH (ref 1.4–6.5)
Neutrophil %: 80.9 %
Platelet: 251 x10 3/mm (ref 150–440)

## 2013-01-30 LAB — COMPREHENSIVE METABOLIC PANEL
Alkaline Phosphatase: 107 U/L (ref 50–136)
Anion Gap: 10 (ref 7–16)
BUN: 13 mg/dL (ref 7–18)
Bilirubin,Total: 0.3 mg/dL (ref 0.2–1.0)
Calcium, Total: 8.7 mg/dL (ref 8.5–10.1)
Chloride: 102 mmol/L (ref 98–107)
EGFR (African American): 46 — ABNORMAL LOW
Glucose: 232 mg/dL — ABNORMAL HIGH (ref 65–99)
Osmolality: 281 (ref 275–301)
SGOT(AST): 11 U/L — ABNORMAL LOW (ref 15–37)
SGPT (ALT): 22 U/L (ref 12–78)
Sodium: 137 mmol/L (ref 136–145)

## 2013-02-01 ENCOUNTER — Ambulatory Visit (INDEPENDENT_AMBULATORY_CARE_PROVIDER_SITE_OTHER): Payer: Medicare Other | Admitting: Internal Medicine

## 2013-02-01 ENCOUNTER — Encounter: Payer: Self-pay | Admitting: Internal Medicine

## 2013-02-01 VITALS — BP 154/62 | HR 87 | Temp 98.3°F | Wt 179.0 lb

## 2013-02-01 DIAGNOSIS — E119 Type 2 diabetes mellitus without complications: Secondary | ICD-10-CM

## 2013-02-01 DIAGNOSIS — I10 Essential (primary) hypertension: Secondary | ICD-10-CM

## 2013-02-01 DIAGNOSIS — C50319 Malignant neoplasm of lower-inner quadrant of unspecified female breast: Secondary | ICD-10-CM

## 2013-02-01 NOTE — Assessment & Plan Note (Signed)
BP Readings from Last 3 Encounters:  02/01/13 154/62  01/22/13 120/82  11/29/12 124/76   BP slightly elevated today, however has generally been well controlled. Will monitor BP closely through chemotherapy. Continue current medications. Will check renal function with labs prior to chemo.

## 2013-02-01 NOTE — Progress Notes (Signed)
Subjective:    Patient ID: Kelsey Mason, female    DOB: 30-Oct-1940, 72 y.o.   MRN: [id number]  HPI 72 year old female with history of diabetes, hypertension, hyperlipidemia, and recent diagnosis of bilateral breast cancer presents for followup. She noted a lump in her breast approximately one year ago. She had never previously had a mammogram. She underwent a mammogram on 10/02/2012. Mammogram with abnormal with bilateral breast mass. She ultimately underwent biopsies which showed adenocarcinoma which was ER/PR positive. She is currently being treated with chemotherapy with Cytoxan and Adriamycin. She reports she is tolerating this well. She has had some watery diarrhea and has stopped her metformin. She has not been regularly checking her blood sugars. She has been compliant with other medications. She notes that plan for future is radiation therapy.  Outpatient Encounter Prescriptions as of 02/01/2013  Medication Sig Dispense Refill  . amLODipine-benazepril (LOTREL) 5-20 MG per capsule Take 1 capsule by mouth daily.  90 capsule  4  . aspirin 81 MG tablet Take 81 mg by mouth daily.      [name] glimepiride (AMARYL) 2 MG tablet Take 1 tablet (2 mg total) by mouth 2 (two) times daily.  180 tablet  4  . glucose blood (GLUCOLAB TEST) test strip Use as instructed  100 each  12  . promethazine (PHENERGAN) 25 MG tablet Take 25 mg by mouth every 6 (six) hours as needed for nausea.      . rosuvastatin (CRESTOR) 10 MG tablet Take 1 tablet (10 mg total) by mouth daily.  90 tablet  4  . Acetaminophen (ARTHRITIS PAIN RELIEF PO) Take 650 mg by mouth as needed.      . Cyanocobalamin (VITAMIN B-12) 2500 MCG SUBL Place 2,500 mcg under the tongue daily.      . cyanocobalamin 2000 MCG tablet Take by mouth daily.       . ondansetron (ZOFRAN) 4 MG tablet Take 1 tablet by mouth daily.      . traMADol (ULTRAM) 50 MG tablet        No facility-administered encounter medications on file as of 02/01/2013.   BP 154/62   Pulse 87  Temp(Src) 98.3 F (36.8 C) (Oral)  Wt 179 lb (81.194 kg)  BMI 32.73 kg/m2  SpO2 99%  Review of Systems  Constitutional: Positive for fatigue. Negative for fever, chills, appetite change and unexpected weight change.  HENT: Negative for ear pain, congestion, sore throat, trouble swallowing, neck pain, voice change and sinus pressure.   Eyes: Negative for visual disturbance.  Respiratory: Negative for cough, shortness of breath, wheezing and stridor.   Cardiovascular: Negative for chest pain, palpitations and leg swelling.  Gastrointestinal: Negative for nausea, vomiting, abdominal pain, diarrhea, constipation, blood in stool, abdominal distention and anal bleeding.  Genitourinary: Negative for dysuria and flank pain.  Musculoskeletal: Negative for myalgias, arthralgias and gait problem.  Skin: Negative for color change and rash.  Neurological: Negative for dizziness and headaches.  Hematological: Negative for adenopathy. Does not bruise/bleed easily.  Psychiatric/Behavioral: Negative for suicidal ideas, sleep disturbance and dysphoric mood. The patient is not nervous/anxious.        Objective:   Physical Exam  Constitutional: She is oriented to person, place, and time. She appears well-developed and well-nourished. No distress.  HENT:  Head: Normocephalic and atraumatic.  Right Ear: External ear normal.  Left Ear: External ear normal.  Nose: Nose normal.  Mouth/Throat: Oropharynx is clear and moist. No oropharyngeal exudate.  Eyes: Conjunctivae are normal. Pupils are equal,  round, and reactive to light. Right eye exhibits no discharge. Left eye exhibits no discharge. No scleral icterus.  Neck: Normal range of motion. Neck supple. No tracheal deviation present. No thyromegaly present.  Cardiovascular: Normal rate, regular rhythm, normal heart sounds and intact distal pulses.  Exam reveals no gallop and no friction rub.   No murmur heard. Pulmonary/Chest: Effort normal and  breath sounds normal. No accessory muscle usage. Not tachypneic. No respiratory distress. She has no decreased breath sounds. She has no wheezes. She has no rhonchi. She has no rales. She exhibits no tenderness.  Musculoskeletal: Normal range of motion. She exhibits no edema and no tenderness.  Lymphadenopathy:    She has no cervical adenopathy.  Neurological: She is alert and oriented to person, place, and time. No cranial nerve deficit. She exhibits normal muscle tone. Coordination normal.  Skin: Skin is warm and dry. No rash noted. She is not diaphoretic. No erythema. No pallor.  Psychiatric: She has a normal mood and affect. Her behavior is normal. Judgment and thought content normal.          Assessment & Plan:

## 2013-02-01 NOTE — Assessment & Plan Note (Signed)
Pt with bilateral breast cancer. Currently undergoing chemotherapy with Cytoxan and Adriamycin. Plan for XRT after chemo complete. Tolerating chemotherapy well. Will continue to monitor.

## 2013-02-01 NOTE — Assessment & Plan Note (Signed)
Lab Results  Component Value Date   HGBA1C 7.8* 10/02/2012   Will request A1c to be checked with labs during chemotherapy. Pt having diarrhea with chemotherapy, so will stop Metformin. Will continue Glimepiride and monitor BG closely. Foot exam normal today.

## 2013-02-02 ENCOUNTER — Ambulatory Visit: Payer: Self-pay | Admitting: Oncology

## 2013-02-06 LAB — CBC CANCER CENTER
HGB: 10.3 g/dL — ABNORMAL LOW (ref 12.0–16.0)
MCH: 30.1 pg (ref 26.0–34.0)
MCHC: 34.8 g/dL (ref 32.0–36.0)
MCV: 86 fL (ref 80–100)
Neutrophil #: 0.4 x10 3/mm — ABNORMAL LOW (ref 1.4–6.5)
Platelet: 109 x10 3/mm — ABNORMAL LOW (ref 150–440)
RDW: 13.4 % (ref 11.5–14.5)
WBC: 1.2 x10 3/mm — CL (ref 3.6–11.0)

## 2013-02-13 LAB — ER/PR,IMMUNOHISTOCHEM,PARAFFIN

## 2013-02-13 LAB — HER-2 / NEU, FISH
Avg Num CEP17 probes/nucleus:: 1.7
Avg Num Her-2 signals/nucleus:: 1.8
HER-2/CEP17 Ratio: 1.07
Number of Tumor Cells Counted:: 40

## 2013-02-13 LAB — CBC CANCER CENTER
Basophil %: 0.5 %
Eosinophil #: 0.7 x10 3/mm (ref 0.0–0.7)
HGB: 10.9 g/dL — ABNORMAL LOW (ref 12.0–16.0)
MCV: 87 fL (ref 80–100)
Monocyte #: 0.7 x10 3/mm (ref 0.2–0.9)
Monocyte %: 2.7 %
Neutrophil %: 88.7 %
Platelet: 165 x10 3/mm (ref 150–440)
RBC: 3.61 10*6/uL — ABNORMAL LOW (ref 3.80–5.20)
RDW: 14.2 % (ref 11.5–14.5)

## 2013-02-14 ENCOUNTER — Telehealth: Payer: Self-pay | Admitting: Internal Medicine

## 2013-02-14 NOTE — Telephone Encounter (Signed)
Recent A1c was elevated at 9.5%. This may be in part due to medications recently given with chemotherapy. We should monitor BG closely, as we may need to add insulin. Can you ask pt to check BG 2-3 times per day for the next few weeks and then let us set up a follow up in 4 weeks.

## 2013-02-14 NOTE — Telephone Encounter (Signed)
Patient informed and verbally agreed. Appt scheduled

## 2013-02-20 LAB — CBC CANCER CENTER
Basophil %: 1.4 %
Eosinophil #: 0.4 x10 3/mm (ref 0.0–0.7)
Eosinophil %: 3.6 %
HCT: 28.5 % — ABNORMAL LOW (ref 35.0–47.0)
HGB: 10 g/dL — ABNORMAL LOW (ref 12.0–16.0)
Lymphocyte %: 8.6 %
MCHC: 35 g/dL (ref 32.0–36.0)
Monocyte #: 0.6 x10 3/mm (ref 0.2–0.9)
Platelet: 247 x10 3/mm (ref 150–440)
RDW: 15.5 % — ABNORMAL HIGH (ref 11.5–14.5)
WBC: 10.1 x10 3/mm (ref 3.6–11.0)

## 2013-02-20 LAB — COMPREHENSIVE METABOLIC PANEL
Alkaline Phosphatase: 105 U/L (ref 50–136)
Bilirubin,Total: 0.3 mg/dL (ref 0.2–1.0)
Calcium, Total: 8.7 mg/dL (ref 8.5–10.1)
Chloride: 99 mmol/L (ref 98–107)
Co2: 26 mmol/L (ref 21–32)
EGFR (African American): 54 — ABNORMAL LOW
EGFR (Non-African Amer.): 47 — ABNORMAL LOW
Osmolality: 277 (ref 275–301)
Potassium: 4.2 mmol/L (ref 3.5–5.1)
SGOT(AST): 11 U/L — ABNORMAL LOW (ref 15–37)
SGPT (ALT): 20 U/L (ref 12–78)
Total Protein: 6.3 g/dL — ABNORMAL LOW (ref 6.4–8.2)

## 2013-02-27 LAB — CBC CANCER CENTER
Basophil #: 0.1 "x10 3/mm "
Basophil %: 4 %
Eosinophil #: 0.2 "x10 3/mm "
Eosinophil %: 13.7 %
HCT: 27.1 % — ABNORMAL LOW
HGB: 9.5 g/dL — ABNORMAL LOW
Lymphocyte %: 22.9 %
Lymphs Abs: 0.3 "x10 3/mm " — ABNORMAL LOW
MCH: 30.7 pg
MCHC: 35.3 g/dL
MCV: 87 fL
Monocyte #: 0.1 "x10 3/mm " — ABNORMAL LOW
Monocyte %: 9.4 %
Neutrophil #: 0.7 "x10 3/mm " — ABNORMAL LOW
Neutrophil %: 50 %
Platelet: 96 "x10 3/mm " — ABNORMAL LOW
RBC: 3.11 "x10 6/mm " — ABNORMAL LOW
RDW: 14.9 % — ABNORMAL HIGH
WBC: 1.5 "x10 3/mm " — CL

## 2013-02-28 ENCOUNTER — Encounter: Payer: Self-pay | Admitting: Internal Medicine

## 2013-02-28 ENCOUNTER — Ambulatory Visit (INDEPENDENT_AMBULATORY_CARE_PROVIDER_SITE_OTHER): Payer: Medicare Other | Admitting: Internal Medicine

## 2013-02-28 VITALS — BP 144/70 | HR 74 | Temp 98.4°F | Wt 174.0 lb

## 2013-02-28 DIAGNOSIS — C50419 Malignant neoplasm of upper-outer quadrant of unspecified female breast: Secondary | ICD-10-CM

## 2013-02-28 DIAGNOSIS — E119 Type 2 diabetes mellitus without complications: Secondary | ICD-10-CM

## 2013-02-28 DIAGNOSIS — I10 Essential (primary) hypertension: Secondary | ICD-10-CM

## 2013-02-28 NOTE — Progress Notes (Signed)
Subjective:    Patient ID: Kelsey Mason, female    DOB: May 21, 1941, 72 y.o.   MRN: [id number]  HPI 72 year old female with recent diagnosis of breast cancer currently undergoing chemotherapy, diabetes, hyperlipidemia, hypertension presents for followup. Recent hemoglobin A1c was 9.5% which was much higher than previous values which are typically been near 7%. However, during the last few months she has been undergoing chemotherapy and intermittently receiving prednisone. She has been checking her blood sugars at home and blood sugars have ranged from 120s to over 300. She has been compliant with glimepiride. She denies any blood sugars less than 70. She recently completed a course of Adriamycin. She reports ongoing symptoms of nausea and vomiting. She had some nausea this morning and took Phenergan with some improvement. After she completes 3 additional courses of chemotherapy she is scheduled to start radiation therapy.  Outpatient Encounter Prescriptions as of 02/28/2013  Medication Sig Dispense Refill  . Acetaminophen (ARTHRITIS PAIN RELIEF PO) Take 650 mg by mouth as needed.      [name] amLODipine-benazepril (LOTREL) 5-20 MG per capsule Take 1 capsule by mouth daily.  90 capsule  4  . aspirin 81 MG tablet Take 81 mg by mouth daily.      [name] glimepiride (AMARYL) 2 MG tablet Take 1 tablet (2 mg total) by mouth 2 (two) times daily.  180 tablet  4  . glucose blood (GLUCOLAB TEST) test strip Use as instructed  100 each  12  . ondansetron (ZOFRAN) 4 MG tablet Take 1 tablet by mouth daily.      . rosuvastatin (CRESTOR) 10 MG tablet Take 1 tablet (10 mg total) by mouth daily.  90 tablet  4  . traMADol (ULTRAM) 50 MG tablet       . Cyanocobalamin (VITAMIN B-12) 2500 MCG SUBL Place 2,500 mcg under the tongue daily.      . cyanocobalamin 2000 MCG tablet Take by mouth daily.       . promethazine (PHENERGAN) 25 MG tablet Take 25 mg by mouth every 6 (six) hours as needed for nausea.       No  facility-administered encounter medications on file as of 02/28/2013.   BP 144/70  Pulse 74  Temp(Src) 98.4 F (36.9 C) (Oral)  Wt 174 lb (78.926 kg)  BMI 31.82 kg/m2  SpO2 99%  Review of Systems  Constitutional: Positive for fatigue. Negative for fever, chills, appetite change and unexpected weight change.  HENT: Negative for ear pain, congestion, sore throat, trouble swallowing, neck pain, voice change and sinus pressure.   Eyes: Negative for visual disturbance.  Respiratory: Negative for cough, shortness of breath, wheezing and stridor.   Cardiovascular: Negative for chest pain, palpitations and leg swelling.  Gastrointestinal: Positive for nausea and vomiting. Negative for abdominal pain, diarrhea, constipation, blood in stool, abdominal distention and anal bleeding.  Genitourinary: Negative for dysuria and flank pain.  Musculoskeletal: Negative for myalgias, arthralgias and gait problem.  Skin: Negative for color change and rash.  Neurological: Negative for dizziness and headaches.  Hematological: Negative for adenopathy. Does not bruise/bleed easily.  Psychiatric/Behavioral: Negative for suicidal ideas, sleep disturbance and dysphoric mood. The patient is not nervous/anxious.        Objective:   Physical Exam  Constitutional: She is oriented to person, place, and time. She appears well-developed and well-nourished. No distress.  HENT:  Head: Normocephalic and atraumatic.  Right Ear: External ear normal.  Left Ear: External ear normal.  Nose: Nose normal.  Mouth/Throat: Oropharynx is  clear and moist. No oropharyngeal exudate.  Eyes: Conjunctivae are normal. Pupils are equal, round, and reactive to light. Right eye exhibits no discharge. Left eye exhibits no discharge. No scleral icterus.  Neck: Normal range of motion. Neck supple. No tracheal deviation present. No thyromegaly present.  Cardiovascular: Normal rate, regular rhythm, normal heart sounds and intact distal pulses.   Exam reveals no gallop and no friction rub.   No murmur heard. Pulmonary/Chest: Effort normal and breath sounds normal. No accessory muscle usage. Not tachypneic. No respiratory distress. She has no decreased breath sounds. She has no wheezes. She has no rhonchi. She has no rales. She exhibits no tenderness.  Musculoskeletal: Normal range of motion. She exhibits no edema and no tenderness.  Lymphadenopathy:    She has no cervical adenopathy.  Neurological: She is alert and oriented to person, place, and time. No cranial nerve deficit. She exhibits normal muscle tone. Coordination normal.  Skin: Skin is warm and dry. No rash noted. She is not diaphoretic. No erythema. No pallor.  Psychiatric: She has a normal mood and affect. Her behavior is normal. Judgment and thought content normal.          Assessment & Plan:

## 2013-02-28 NOTE — Assessment & Plan Note (Signed)
Blood sugars markedly elevated with A1c of 9.5%. Recent BG have been between 150 and 300. Recommended starting low dose of either long acting insulin, or, at a minimum, short acting insulin for BG>250. Pt declines. We discussed that oral medications, such as Onglyza, might be helpful, but I am reluctant to start these given potential for increased nausea, while she is on chemotherapy. She will monitor BG carefully and try to limited intake of processed sugars. She will email or drop off BG report in 1 month and follow up 2 months and prn.

## 2013-02-28 NOTE — Assessment & Plan Note (Signed)
Continues to go through chemotherapy. Having significant fatigue and nausea with Adriamycin which is being treated by her oncologist. Will continue to follow.

## 2013-03-05 ENCOUNTER — Ambulatory Visit: Payer: Self-pay | Admitting: Oncology

## 2013-03-06 LAB — CBC CANCER CENTER
Basophil #: 0.1 x10 3/mm (ref 0.0–0.1)
Basophil %: 0.8 %
Eosinophil #: 0.3 x10 3/mm (ref 0.0–0.7)
Eosinophil %: 1.4 %
HCT: 29 % — ABNORMAL LOW (ref 35.0–47.0)
HGB: 9.8 g/dL — ABNORMAL LOW (ref 12.0–16.0)
Lymphocyte %: 5.5 %
MCH: 29.9 pg (ref 26.0–34.0)
MCHC: 33.9 g/dL (ref 32.0–36.0)
MCV: 88 fL (ref 80–100)
Monocyte #: 0.7 x10 3/mm (ref 0.2–0.9)
Monocyte %: 4.2 %
Neutrophil %: 88.1 %
Platelet: 189 x10 3/mm (ref 150–440)
WBC: 17.6 x10 3/mm — ABNORMAL HIGH (ref 3.6–11.0)

## 2013-03-13 LAB — COMPREHENSIVE METABOLIC PANEL
BUN: 15 mg/dL (ref 7–18)
Co2: 26 mmol/L (ref 21–32)
Creatinine: 1.32 mg/dL — ABNORMAL HIGH (ref 0.60–1.30)
EGFR (African American): 47 — ABNORMAL LOW
EGFR (Non-African Amer.): 40 — ABNORMAL LOW
Osmolality: 288 (ref 275–301)
Potassium: 4.8 mmol/L (ref 3.5–5.1)
SGOT(AST): 10 U/L — ABNORMAL LOW (ref 15–37)
SGPT (ALT): 25 U/L (ref 12–78)
Sodium: 131 mmol/L — ABNORMAL LOW (ref 136–145)

## 2013-03-13 LAB — CBC CANCER CENTER
Basophil %: 1 %
Eosinophil #: 0.2 x10 3/mm (ref 0.0–0.7)
HCT: 30.6 % — ABNORMAL LOW (ref 35.0–47.0)
Lymphocyte #: 0.7 x10 3/mm — ABNORMAL LOW (ref 1.0–3.6)
MCHC: 33.3 g/dL (ref 32.0–36.0)
MCV: 91 fL (ref 80–100)
Monocyte #: 0.6 x10 3/mm (ref 0.2–0.9)
Neutrophil %: 83.2 %
Platelet: 249 x10 3/mm (ref 150–440)
RBC: 3.37 10*6/uL — ABNORMAL LOW (ref 3.80–5.20)
RDW: 17.2 % — ABNORMAL HIGH (ref 11.5–14.5)

## 2013-03-14 ENCOUNTER — Telehealth: Payer: Self-pay | Admitting: Internal Medicine

## 2013-03-14 DIAGNOSIS — E785 Hyperlipidemia, unspecified: Secondary | ICD-10-CM

## 2013-03-14 DIAGNOSIS — I10 Essential (primary) hypertension: Secondary | ICD-10-CM

## 2013-03-14 DIAGNOSIS — E119 Type 2 diabetes mellitus without complications: Secondary | ICD-10-CM

## 2013-03-14 MED ORDER — AMLODIPINE BESY-BENAZEPRIL HCL 5-20 MG PO CAPS: 1.0000 | ORAL_CAPSULE | Freq: Every day | ORAL | Status: DC

## 2013-03-14 MED ORDER — METFORMIN HCL 1000 MG PO TABS: 500.0000 mg | ORAL_TABLET | Freq: Two times a day (BID) | ORAL | Status: DC

## 2013-03-14 MED ORDER — GLUCOSE BLOOD VI STRP: ORAL_STRIP | Status: DC

## 2013-03-14 NOTE — Telephone Encounter (Signed)
She needs a visit to address high blood sugars.

## 2013-03-14 NOTE — Telephone Encounter (Signed)
Pt is needing rx for her meter and list of meds I gave you. Pt uses [facility]

## 2013-03-14 NOTE — Telephone Encounter (Signed)
Sent in prescriptions to the pharmacy but patient stated when she was at her other doctor's office her BS was over 500. They gave a shot of insulin and at home her numbers has been running in the high 200s. Has not been taking her Metformin because she has not had any, refill has been sent to the pharmacy.

## 2013-03-14 NOTE — Telephone Encounter (Signed)
Dr. Oliva Bustard would to speak with you regarding [name]-please call at your earliest convenience

## 2013-03-15 NOTE — Telephone Encounter (Signed)
Left message to call back  

## 2013-03-15 NOTE — Telephone Encounter (Signed)
Patient has been scheduled for an appointment.

## 2013-03-20 LAB — CBC CANCER CENTER
HGB: 9.5 g/dL — ABNORMAL LOW (ref 12.0–16.0)
Lymphocyte #: 0.3 x10 3/mm — ABNORMAL LOW (ref 1.0–3.6)
MCH: 30.5 pg (ref 26.0–34.0)
Monocyte #: 0.1 x10 3/mm — ABNORMAL LOW (ref 0.2–0.9)
Monocyte %: 8 %
Platelet: 76 x10 3/mm — ABNORMAL LOW (ref 150–440)
RBC: 3.11 10*6/uL — ABNORMAL LOW (ref 3.80–5.20)
WBC: 1 x10 3/mm — CL (ref 3.6–11.0)

## 2013-03-21 ENCOUNTER — Ambulatory Visit (INDEPENDENT_AMBULATORY_CARE_PROVIDER_SITE_OTHER): Payer: Medicare Other | Admitting: Internal Medicine

## 2013-03-21 ENCOUNTER — Encounter: Payer: Self-pay | Admitting: Internal Medicine

## 2013-03-21 VITALS — BP 148/68 | HR 86 | Temp 98.3°F | Wt 173.0 lb

## 2013-03-21 DIAGNOSIS — C50319 Malignant neoplasm of lower-inner quadrant of unspecified female breast: Secondary | ICD-10-CM

## 2013-03-21 DIAGNOSIS — E785 Hyperlipidemia, unspecified: Secondary | ICD-10-CM

## 2013-03-21 DIAGNOSIS — E119 Type 2 diabetes mellitus without complications: Secondary | ICD-10-CM

## 2013-03-21 DIAGNOSIS — I10 Essential (primary) hypertension: Secondary | ICD-10-CM

## 2013-03-21 NOTE — Assessment & Plan Note (Addendum)
Blood sugars have been intermittently elevated, particularly during chemotherapy. However, patient has a history of excellent control of blood sugars in the past and some recent blood sugars at home and been near 100 fasting. Will start NovoLog 4 units as needed for blood sugar greater than 250 prior to meals. Demonstrated how to give Novolog injection today. We discussed potentially adding long acting insulin such as Lantus, however I am reluctant to do this right now, as pt having some lower fasting BG. Will monitor BG 2-3 times daily. Followup in one week.

## 2013-03-21 NOTE — Progress Notes (Signed)
Subjective:    Patient ID: Kelsey Mason, female    DOB: 06-Feb-1941, 72 y.o.   MRN: [id number]  HPI 72 year old female currently undergoing chemotherapy for breast cancer presents for acute visit to address elevated blood sugars. She has a history of diabetes which has been well-controlled with metformin and Amaryl. Over the last several months, she has been undergoing chemotherapy and received several doses of prednisone. Recent A1c was 9.5%. She notes that blood sugars have intermittently been markedly elevated. During recent chemotherapy blood sugar was greater than 500. However, record of blood sugars from home shows fasting sugars near 100. She occasionally has blood sugars between 203 100 at home. She has never taken insulin.  Outpatient Encounter Prescriptions as of 03/21/2013  Medication Sig Dispense Refill  . Acetaminophen (ARTHRITIS PAIN RELIEF PO) Take 650 mg by mouth as needed.      [name] amLODipine-benazepril (LOTREL) 5-20 MG per capsule Take 1 capsule by mouth daily.  90 capsule  2  . aspirin 81 MG tablet Take 81 mg by mouth daily.      . Cyanocobalamin (VITAMIN B-12) 2500 MCG SUBL Place 2,500 mcg under the tongue daily.      . cyanocobalamin 2000 MCG tablet Take by mouth daily.       [name] glimepiride (AMARYL) 2 MG tablet Take 1 tablet (2 mg total) by mouth 2 (two) times daily.  180 tablet  4  . glucose blood (BAYER CONTOUR NEXT TEST) test strip Patient will use to check blood sugars at home 2-3 times daily. Dx. Code 250.00  100 each  12  . levofloxacin (LEVAQUIN) 500 MG tablet       . metFORMIN (GLUCOPHAGE) 1000 MG tablet Take 0.5 tablets (500 mg total) by mouth 2 (two) times daily.  180 tablet  2  . ondansetron (ZOFRAN) 4 MG tablet Take 1 tablet by mouth daily.      . promethazine (PHENERGAN) 25 MG tablet Take 25 mg by mouth every 6 (six) hours as needed for nausea.      . rosuvastatin (CRESTOR) 10 MG tablet Take 1 tablet (10 mg total) by mouth daily.  90 tablet  4  . traMADol  (ULTRAM) 50 MG tablet        No facility-administered encounter medications on file as of 03/21/2013.   BP 148/68  Pulse 86  Temp(Src) 98.3 F (36.8 C) (Oral)  Wt 173 lb (78.472 kg)  BMI 31.63 kg/m2  SpO2 99%  Review of Systems  Constitutional: Positive for fatigue. Negative for fever, chills, appetite change and unexpected weight change.  HENT: Negative for ear pain, congestion, sore throat, trouble swallowing, neck pain, voice change and sinus pressure.   Eyes: Negative for visual disturbance.  Respiratory: Negative for cough, shortness of breath, wheezing and stridor.   Cardiovascular: Negative for chest pain, palpitations and leg swelling.  Gastrointestinal: Negative for nausea, vomiting, abdominal pain, diarrhea, constipation, blood in stool, abdominal distention and anal bleeding.  Genitourinary: Negative for dysuria and flank pain.  Musculoskeletal: Negative for myalgias, arthralgias and gait problem.  Skin: Negative for color change and rash.  Neurological: Negative for dizziness and headaches.  Hematological: Negative for adenopathy. Does not bruise/bleed easily.  Psychiatric/Behavioral: Negative for suicidal ideas, sleep disturbance and dysphoric mood. The patient is not nervous/anxious.        Objective:   Physical Exam  Constitutional: She is oriented to person, place, and time. She appears well-developed and well-nourished. No distress.  HENT:  Head: Normocephalic and atraumatic.  Right Ear: External ear normal.  Left Ear: External ear normal.  Nose: Nose normal.  Mouth/Throat: Oropharynx is clear and moist. No oropharyngeal exudate.  Eyes: Conjunctivae are normal. Pupils are equal, round, and reactive to light. Right eye exhibits no discharge. Left eye exhibits no discharge. No scleral icterus.  Neck: Normal range of motion. Neck supple. No tracheal deviation present. No thyromegaly present.  Cardiovascular: Normal rate, regular rhythm, normal heart sounds and  intact distal pulses.  Exam reveals no gallop and no friction rub.   No murmur heard. Pulmonary/Chest: Effort normal and breath sounds normal. No accessory muscle usage. Not tachypneic. No respiratory distress. She has no decreased breath sounds. She has no wheezes. She has no rhonchi. She has no rales. She exhibits no tenderness.  Musculoskeletal: Normal range of motion. She exhibits no edema and no tenderness.  Lymphadenopathy:    She has no cervical adenopathy.  Neurological: She is alert and oriented to person, place, and time. No cranial nerve deficit. She exhibits normal muscle tone. Coordination normal.  Skin: Skin is warm and dry. No rash noted. She is not diaphoretic. No erythema. No pallor.  Psychiatric: She has a normal mood and affect. Her behavior is normal. Judgment and thought content normal.          Assessment & Plan:

## 2013-03-21 NOTE — Patient Instructions (Addendum)
Check blood sugars 3 times daily prior to meals. If blood sugar is >250, give 4 units of Novolog insulin.   Call if any blood sugars less 100 or persistent blood sugars >250.  Bring record of sugars to next visit.  Follow up 1 week.

## 2013-03-21 NOTE — Assessment & Plan Note (Signed)
Currently undergoing chemotherapy. Discussed that some medications such as prednisone, when given for nausea, may elevated BG. Will add Novolog as above. Pt will complete chemotherapy and start radiation therapy next month. Discussed that she may be able to stop insulin once chemotherapy complete.

## 2013-03-27 LAB — CBC CANCER CENTER
Basophil #: 0.1 x10 3/mm (ref 0.0–0.1)
Eosinophil #: 0.1 x10 3/mm (ref 0.0–0.7)
HGB: 9.7 g/dL — ABNORMAL LOW (ref 12.0–16.0)
Lymphocyte #: 1 x10 3/mm (ref 1.0–3.6)
Lymphocyte %: 7.2 %
MCH: 30.8 pg (ref 26.0–34.0)
MCHC: 33.9 g/dL (ref 32.0–36.0)
Monocyte #: 0.7 x10 3/mm (ref 0.2–0.9)
Monocyte %: 5.5 %
Neutrophil #: 11.7 x10 3/mm — ABNORMAL HIGH (ref 1.4–6.5)
RBC: 3.15 10*6/uL — ABNORMAL LOW (ref 3.80–5.20)
WBC: 13.6 x10 3/mm — ABNORMAL HIGH (ref 3.6–11.0)

## 2013-03-28 ENCOUNTER — Encounter: Payer: Self-pay | Admitting: Internal Medicine

## 2013-03-28 ENCOUNTER — Ambulatory Visit (INDEPENDENT_AMBULATORY_CARE_PROVIDER_SITE_OTHER): Payer: Medicare Other | Admitting: Internal Medicine

## 2013-03-28 VITALS — BP 160/80 | HR 93 | Temp 98.2°F | Wt 174.0 lb

## 2013-03-28 DIAGNOSIS — E119 Type 2 diabetes mellitus without complications: Secondary | ICD-10-CM

## 2013-03-28 NOTE — Progress Notes (Signed)
Subjective:    Patient ID: Kelsey Mason, female    DOB: Nov 11, 1940, 72 y.o.   MRN: [id number]  HPI 72 year old female with history of breast cancer on chemotherapy and diabetes presents for followup. At her last visit, blood sugars were extremely high. She was started on NovoLog insulin 4 units as needed for blood sugars greater than 250. However, over the last several weeks she has been off chemotherapy and has had poor appetite. Blood sugars have been low with some blood sugars low as 70. She has not required any insulin. She does continue to take Amaryl and metformin.  Outpatient Encounter Prescriptions as of 03/28/2013  Medication Sig Dispense Refill  . Acetaminophen (ARTHRITIS PAIN RELIEF PO) Take 650 mg by mouth as needed.      [name] amLODipine-benazepril (LOTREL) 5-20 MG per capsule Take 1 capsule by mouth daily.  90 capsule  2  . aspirin 81 MG tablet Take 81 mg by mouth daily.      . Cyanocobalamin (VITAMIN B-12) 2500 MCG SUBL Place 2,500 mcg under the tongue daily.      . cyanocobalamin 2000 MCG tablet Take by mouth daily.       [name] glimepiride (AMARYL) 2 MG tablet Take 1 tablet (2 mg total) by mouth 2 (two) times daily.  180 tablet  4  . glucose blood (BAYER CONTOUR NEXT TEST) test strip Patient will use to check blood sugars at home 2-3 times daily. Dx. Code 250.00  100 each  12  . metFORMIN (GLUCOPHAGE) 1000 MG tablet Take 0.5 tablets (500 mg total) by mouth 2 (two) times daily.  180 tablet  2  . ondansetron (ZOFRAN) 4 MG tablet Take 1 tablet by mouth daily.      . promethazine (PHENERGAN) 25 MG tablet Take 25 mg by mouth every 6 (six) hours as needed for nausea.      . rosuvastatin (CRESTOR) 10 MG tablet Take 1 tablet (10 mg total) by mouth daily.  90 tablet  4  . traMADol (ULTRAM) 50 MG tablet       . [DISCONTINUED] levofloxacin (LEVAQUIN) 500 MG tablet        No facility-administered encounter medications on file as of 03/28/2013.   BP 160/80  Pulse 93  Temp(Src) 98.2 F (36.8  C) (Oral)  Wt 174 lb (78.926 kg)  BMI 31.82 kg/m2  SpO2 97%  Review of Systems  Constitutional: Positive for fatigue. Negative for fever, chills, appetite change and unexpected weight change.  HENT: Negative for ear pain, congestion, sore throat, trouble swallowing, neck pain, voice change and sinus pressure.   Eyes: Negative for visual disturbance.  Respiratory: Negative for cough, shortness of breath, wheezing and stridor.   Cardiovascular: Negative for chest pain, palpitations and leg swelling.  Gastrointestinal: Positive for nausea (with chemotherapy). Negative for vomiting, abdominal pain, diarrhea, constipation, blood in stool, abdominal distention and anal bleeding.  Genitourinary: Negative for dysuria and flank pain.  Musculoskeletal: Negative for myalgias, arthralgias and gait problem.  Skin: Negative for color change and rash.  Neurological: Negative for dizziness and headaches.  Hematological: Negative for adenopathy. Does not bruise/bleed easily.  Psychiatric/Behavioral: Negative for suicidal ideas, sleep disturbance and dysphoric mood. The patient is not nervous/anxious.        Objective:   Physical Exam  Constitutional: She is oriented to person, place, and time. She appears well-developed and well-nourished. No distress.  HENT:  Head: Normocephalic and atraumatic.  Right Ear: External ear normal.  Left Ear: External ear normal.  Nose: Nose normal.  Mouth/Throat: Oropharynx is clear and moist. No oropharyngeal exudate.  Eyes: Conjunctivae are normal. Pupils are equal, round, and reactive to light. Right eye exhibits no discharge. Left eye exhibits no discharge. No scleral icterus.  Neck: Normal range of motion. Neck supple. No tracheal deviation present. No thyromegaly present.  Cardiovascular: Normal rate, regular rhythm, normal heart sounds and intact distal pulses.  Exam reveals no gallop and no friction rub.   No murmur heard. Pulmonary/Chest: Effort normal and  breath sounds normal. No accessory muscle usage. Not tachypneic. No respiratory distress. She has no decreased breath sounds. She has no wheezes. She has no rhonchi. She has no rales. She exhibits no tenderness.  Musculoskeletal: Normal range of motion. She exhibits no edema and no tenderness.  Lymphadenopathy:    She has no cervical adenopathy.  Neurological: She is alert and oriented to person, place, and time. No cranial nerve deficit. She exhibits normal muscle tone. Coordination normal.  Skin: Skin is warm and dry. No rash noted. She is not diaphoretic. No erythema. No pallor.  Psychiatric: She has a normal mood and affect. Her behavior is normal. Judgment and thought content normal.          Assessment & Plan:

## 2013-03-28 NOTE — Assessment & Plan Note (Signed)
Patient currently has good control of blood sugars with Amaryl and metformin alone.  She will plan to use 4 units Novolog Insulin if blood sugar prior to a meal is greater than 250. Plan followup in 4 weeks or sooner as needed.

## 2013-04-03 LAB — COMPREHENSIVE METABOLIC PANEL
Albumin: 3.8 g/dL (ref 3.4–5.0)
Alkaline Phosphatase: 88 U/L (ref 50–136)
BUN: 7 mg/dL (ref 7–18)
Bilirubin,Total: 0.4 mg/dL (ref 0.2–1.0)
Chloride: 100 mmol/L (ref 98–107)
EGFR (Non-African Amer.): 52 — ABNORMAL LOW
Glucose: 203 mg/dL — ABNORMAL HIGH (ref 65–99)
Osmolality: 279 (ref 275–301)
Potassium: 3.9 mmol/L (ref 3.5–5.1)
SGPT (ALT): 28 U/L (ref 12–78)
Total Protein: 6.2 g/dL — ABNORMAL LOW (ref 6.4–8.2)

## 2013-04-03 LAB — CBC CANCER CENTER
Basophil #: 0.1 x10 3/mm (ref 0.0–0.1)
HCT: 28.3 % — ABNORMAL LOW (ref 35.0–47.0)
HGB: 9.8 g/dL — ABNORMAL LOW (ref 12.0–16.0)
Lymphocyte #: 0.7 x10 3/mm — ABNORMAL LOW (ref 1.0–3.6)
MCH: 31.7 pg (ref 26.0–34.0)
MCV: 92 fL (ref 80–100)
Monocyte #: 0.5 x10 3/mm (ref 0.2–0.9)
Monocyte %: 6.4 %
Neutrophil #: 6.6 x10 3/mm — ABNORMAL HIGH (ref 1.4–6.5)
Neutrophil %: 83.3 %

## 2013-04-04 ENCOUNTER — Ambulatory Visit: Payer: Self-pay | Admitting: Oncology

## 2013-04-04 ENCOUNTER — Other Ambulatory Visit: Payer: Self-pay | Admitting: Internal Medicine

## 2013-04-04 NOTE — Telephone Encounter (Signed)
Eprescribed.

## 2013-04-10 LAB — CBC CANCER CENTER
Basophil #: 0.1 x10 3/mm (ref 0.0–0.1)
Eosinophil #: 0.5 x10 3/mm (ref 0.0–0.7)
Eosinophil %: 7.3 %
HGB: 9.9 g/dL — ABNORMAL LOW (ref 12.0–16.0)
Lymphocyte #: 0.7 x10 3/mm — ABNORMAL LOW (ref 1.0–3.6)
MCHC: 35.3 g/dL (ref 32.0–36.0)
MCV: 92 fL (ref 80–100)
Monocyte %: 7.1 %
Neutrophil %: 74.2 %
Platelet: 228 x10 3/mm (ref 150–440)
RDW: 16.6 % — ABNORMAL HIGH (ref 11.5–14.5)

## 2013-04-17 LAB — CBC CANCER CENTER
Basophil %: 1.1 %
Eosinophil #: 0.8 x10 3/mm — ABNORMAL HIGH (ref 0.0–0.7)
Eosinophil %: 9 %
HCT: 29 % — ABNORMAL LOW (ref 35.0–47.0)
HGB: 10.2 g/dL — ABNORMAL LOW (ref 12.0–16.0)
Lymphocyte %: 10 %
MCHC: 35.3 g/dL (ref 32.0–36.0)
Monocyte %: 5 %
Neutrophil #: 6.3 x10 3/mm (ref 1.4–6.5)
Neutrophil %: 74.9 %
Platelet: 246 x10 3/mm (ref 150–440)
RBC: 3.13 10*6/uL — ABNORMAL LOW (ref 3.80–5.20)
RDW: 16.3 % — ABNORMAL HIGH (ref 11.5–14.5)
WBC: 8.4 x10 3/mm (ref 3.6–11.0)

## 2013-04-24 LAB — CBC CANCER CENTER
Basophil #: 0.1 x10 3/mm (ref 0.0–0.1)
Basophil %: 1.1 %
Eosinophil #: 0.6 x10 3/mm (ref 0.0–0.7)
Eosinophil %: 6.8 %
Lymphocyte %: 8.6 %
MCH: 32.8 pg (ref 26.0–34.0)
MCHC: 35.1 g/dL (ref 32.0–36.0)
MCV: 93 fL (ref 80–100)
Monocyte #: 0.4 x10 3/mm (ref 0.2–0.9)
Monocyte %: 5 %
Neutrophil #: 6.9 x10 3/mm — ABNORMAL HIGH (ref 1.4–6.5)
Neutrophil %: 78.5 %
RDW: 15.7 % — ABNORMAL HIGH (ref 11.5–14.5)

## 2013-04-26 ENCOUNTER — Encounter: Payer: Self-pay | Admitting: Internal Medicine

## 2013-04-26 ENCOUNTER — Ambulatory Visit (INDEPENDENT_AMBULATORY_CARE_PROVIDER_SITE_OTHER): Payer: Medicare Other | Admitting: Internal Medicine

## 2013-04-26 ENCOUNTER — Encounter (INDEPENDENT_AMBULATORY_CARE_PROVIDER_SITE_OTHER): Payer: Self-pay

## 2013-04-26 VITALS — BP 162/72 | HR 86 | Temp 97.8°F | Wt 175.0 lb

## 2013-04-26 DIAGNOSIS — I10 Essential (primary) hypertension: Secondary | ICD-10-CM

## 2013-04-26 DIAGNOSIS — E119 Type 2 diabetes mellitus without complications: Secondary | ICD-10-CM

## 2013-04-26 DIAGNOSIS — G4700 Insomnia, unspecified: Secondary | ICD-10-CM | POA: Insufficient documentation

## 2013-04-26 DIAGNOSIS — D0590 Unspecified type of carcinoma in situ of unspecified breast: Secondary | ICD-10-CM

## 2013-04-26 NOTE — Progress Notes (Signed)
Subjective:    Patient ID: Kelsey Mason, female    DOB: [birth year], 72 y.o.   MRN: [id number]  HPI 72 year old female with history of breast cancer, undergoing chemotherapy, diabetes, hypertension presents for followup. She reports that blood sugars have been better controlled now that she is not receiving premedications for chemotherapy. She has now completed Adriamycin and is taking Taxol. She is tolerating this well. Blood sugars have been generally less than 250. She has not required any additional NovoLog. She has had some symptoms of "shakiness "and has been prescribed new medication for this but cannot recall what it is. Her only other concern today is recent difficulty falling asleep. She has tried OTC melatonin and benadryl with minimal improvement. She notes difficulty both falling asleep and staying asleep.  Outpatient Encounter Prescriptions as of 04/26/2013  Medication Sig Dispense Refill  . Acetaminophen (ARTHRITIS PAIN RELIEF PO) Take 650 mg by mouth as needed.      . ALPRAZolam (XANAX) 0.25 MG tablet       . amLODipine-benazepril (LOTREL) 5-20 MG per capsule Take 1 capsule by mouth daily.  90 capsule  2  . aspirin 81 MG tablet Take 81 mg by mouth daily.      . Cyanocobalamin (VITAMIN B-12) 2500 MCG SUBL Place 2,500 mcg under the tongue daily.      [name] glimepiride (AMARYL) 2 MG tablet Take 1 tablet (2 mg total) by mouth 2 (two) times daily.  180 tablet  4  . glucose blood (BAYER CONTOUR NEXT TEST) test strip Patient will use to check blood sugars at home 2-3 times daily. Dx. Code 250.00  100 each  12  . metFORMIN (GLUCOPHAGE) 1000 MG tablet Take 0.5 tablets (500 mg total) by mouth 2 (two) times daily.  180 tablet  2  . ondansetron (ZOFRAN) 4 MG tablet Take 1 tablet by mouth daily.      . promethazine (PHENERGAN) 25 MG tablet Take 25 mg by mouth every 6 (six) hours as needed for nausea.      . rosuvastatin (CRESTOR) 10 MG tablet Take 1 tablet (10 mg total) by mouth daily.  90  tablet  4  . cyanocobalamin 2000 MCG tablet Take by mouth daily.       . traMADol (ULTRAM) 50 MG tablet        No facility-administered encounter medications on file as of 04/26/2013.   BP 162/72  Pulse 86  Temp(Src) 97.8 F (36.6 C) (Oral)  Wt 175 lb (79.379 kg)  BMI 32 kg/m2  SpO2 98%  Review of Systems  Constitutional: Positive for fatigue. Negative for fever, chills, appetite change and unexpected weight change.  HENT: Negative for congestion, ear pain, sinus pressure, sore throat, trouble swallowing and voice change.   Eyes: Negative for visual disturbance.  Respiratory: Negative for cough, shortness of breath, wheezing and stridor.   Cardiovascular: Negative for chest pain, palpitations and leg swelling.  Gastrointestinal: Negative for nausea, vomiting, abdominal pain, diarrhea, constipation, blood in stool, abdominal distention and anal bleeding.  Genitourinary: Negative for dysuria and flank pain.  Musculoskeletal: Negative for arthralgias, gait problem, myalgias and neck pain.  Skin: Negative for color change and rash.  Neurological: Negative for dizziness and headaches.  Hematological: Negative for adenopathy. Does not bruise/bleed easily.  Psychiatric/Behavioral: Negative for suicidal ideas, sleep disturbance and dysphoric mood. The patient is nervous/anxious.        Objective:   Physical Exam  Constitutional: She is oriented to person, place, and time. She appears  well-developed and well-nourished. No distress.  HENT:  Head: Normocephalic and atraumatic.  Right Ear: External ear normal.  Left Ear: External ear normal.  Nose: Nose normal.  Mouth/Throat: Oropharynx is clear and moist. No oropharyngeal exudate.  Eyes: Conjunctivae are normal. Pupils are equal, round, and reactive to light. Right eye exhibits no discharge. Left eye exhibits no discharge. No scleral icterus.  Neck: Normal range of motion. Neck supple. No tracheal deviation present. No thyromegaly  present.  Cardiovascular: Normal rate, regular rhythm, normal heart sounds and intact distal pulses.  Exam reveals no gallop and no friction rub.   No murmur heard. Pulmonary/Chest: Effort normal and breath sounds normal. No accessory muscle usage. Not tachypneic. No respiratory distress. She has no decreased breath sounds. She has no wheezes. She has no rhonchi. She has no rales. She exhibits no tenderness.  Musculoskeletal: Normal range of motion. She exhibits no edema and no tenderness.  Lymphadenopathy:    She has no cervical adenopathy.  Neurological: She is alert and oriented to person, place, and time. No cranial nerve deficit. She exhibits normal muscle tone. Coordination normal.  Skin: Skin is warm and dry. No rash noted. She is not diaphoretic. No erythema. No pallor.  Psychiatric: She has a normal mood and affect. Her behavior is normal. Judgment and thought content normal.          Assessment & Plan:

## 2013-04-26 NOTE — Assessment & Plan Note (Signed)
Blood sugars have been well-controlled without the need for additional coverage with NovoLog. Will continue to monitor. Plan to recheck A1c with labs next month.

## 2013-04-26 NOTE — Assessment & Plan Note (Signed)
BP Readings from Last 3 Encounters:  04/26/13 162/72  03/28/13 160/80  03/21/13 148/68   Blood pressure slightly elevated today however generally has been well-controlled. Blood pressure has been monitored frequently as patient undergoes chemotherapy. Will continue to monitor for now. Continue amlodipine and benazepril.

## 2013-04-26 NOTE — Assessment & Plan Note (Signed)
Tolerating chemotherapy with Taxol well. Will request records on recent addition of medication to help with side effects of Taxol.

## 2013-04-26 NOTE — Assessment & Plan Note (Signed)
Encouraged good sleep hygiene including physical activity during the day, avoidance of caffeine, maintaining a quiet sleep environment, avoiding distractions. Discussed potentially adding medication to help with sleep however patient declines for now.

## 2013-05-01 LAB — CBC CANCER CENTER
Basophil #: 0.1 x10 3/mm (ref 0.0–0.1)
Basophil %: 0.8 %
Eosinophil #: 0.5 x10 3/mm (ref 0.0–0.7)
Eosinophil %: 5 %
HCT: 30.4 % — ABNORMAL LOW (ref 35.0–47.0)
MCH: 32.4 pg (ref 26.0–34.0)
MCHC: 34.8 g/dL (ref 32.0–36.0)
MCV: 93 fL (ref 80–100)
Monocyte %: 5 %
Neutrophil #: 7.5 x10 3/mm — ABNORMAL HIGH (ref 1.4–6.5)
Neutrophil %: 79.9 %
RBC: 3.26 10*6/uL — ABNORMAL LOW (ref 3.80–5.20)
RDW: 15.1 % — ABNORMAL HIGH (ref 11.5–14.5)

## 2013-05-05 ENCOUNTER — Ambulatory Visit: Payer: Self-pay | Admitting: Oncology

## 2013-05-08 LAB — CBC CANCER CENTER
Basophil #: 0.1 x10 3/mm (ref 0.0–0.1)
Basophil %: 0.9 %
Eosinophil #: 0.3 x10 3/mm (ref 0.0–0.7)
Eosinophil %: 4.2 %
HCT: 29.8 % — ABNORMAL LOW (ref 35.0–47.0)
HGB: 10.3 g/dL — ABNORMAL LOW (ref 12.0–16.0)
Lymphocyte #: 0.8 x10 3/mm — ABNORMAL LOW (ref 1.0–3.6)
MCHC: 34.4 g/dL (ref 32.0–36.0)
Monocyte #: 0.4 x10 3/mm (ref 0.2–0.9)
Monocyte %: 5.3 %
Neutrophil #: 6.7 x10 3/mm — ABNORMAL HIGH (ref 1.4–6.5)
Platelet: 227 x10 3/mm (ref 150–440)
RDW: 15 % — ABNORMAL HIGH (ref 11.5–14.5)

## 2013-05-08 LAB — COMPREHENSIVE METABOLIC PANEL
Alkaline Phosphatase: 85 U/L (ref 50–136)
BUN: 10 mg/dL (ref 7–18)
Bilirubin,Total: 0.5 mg/dL (ref 0.2–1.0)
Calcium, Total: 8.9 mg/dL (ref 8.5–10.1)
Chloride: 98 mmol/L (ref 98–107)
Co2: 24 mmol/L (ref 21–32)
EGFR (Non-African Amer.): 49 — ABNORMAL LOW
Glucose: 237 mg/dL — ABNORMAL HIGH (ref 65–99)
Osmolality: 271 (ref 275–301)
SGOT(AST): 15 U/L (ref 15–37)
SGPT (ALT): 29 U/L (ref 12–78)
Sodium: 132 mmol/L — ABNORMAL LOW (ref 136–145)
Total Protein: 6.5 g/dL (ref 6.4–8.2)

## 2013-05-15 LAB — CBC CANCER CENTER
Basophil #: 0.1 x10 3/mm (ref 0.0–0.1)
Basophil %: 0.8 %
Eosinophil #: 0.3 x10 3/mm (ref 0.0–0.7)
HGB: 9.9 g/dL — ABNORMAL LOW (ref 12.0–16.0)
MCHC: 34.3 g/dL (ref 32.0–36.0)
Monocyte #: 0.4 x10 3/mm (ref 0.2–0.9)
Monocyte %: 5.4 %
Neutrophil %: 79.3 %
Platelet: 221 x10 3/mm (ref 150–440)
RBC: 3.09 10*6/uL — ABNORMAL LOW (ref 3.80–5.20)
RDW: 14.6 % — ABNORMAL HIGH (ref 11.5–14.5)

## 2013-05-17 ENCOUNTER — Other Ambulatory Visit (INDEPENDENT_AMBULATORY_CARE_PROVIDER_SITE_OTHER): Payer: Medicare Other

## 2013-05-17 DIAGNOSIS — E119 Type 2 diabetes mellitus without complications: Secondary | ICD-10-CM

## 2013-05-17 LAB — COMPREHENSIVE METABOLIC PANEL
AST: 22 U/L (ref 0–37)
Albumin: 4.1 g/dL (ref 3.5–5.2)
Alkaline Phosphatase: 60 U/L (ref 39–117)
Potassium: 3.7 mEq/L (ref 3.5–5.1)
Sodium: 136 mEq/L (ref 135–145)
Total Bilirubin: 0.7 mg/dL (ref 0.3–1.2)
Total Protein: 6.3 g/dL (ref 6.0–8.3)

## 2013-05-17 LAB — MICROALBUMIN / CREATININE URINE RATIO
Microalb Creat Ratio: 3.8 mg/g (ref 0.0–30.0)
Microalb, Ur: 4.2 mg/dL — ABNORMAL HIGH (ref 0.0–1.9)

## 2013-05-22 LAB — CBC CANCER CENTER
Basophil #: 0.1 x10 3/mm (ref 0.0–0.1)
Basophil %: 1.3 %
HCT: 28.7 % — ABNORMAL LOW (ref 35.0–47.0)
HGB: 10.1 g/dL — ABNORMAL LOW (ref 12.0–16.0)
Lymphocyte %: 12.5 %
MCH: 32.7 pg (ref 26.0–34.0)
MCHC: 35.4 g/dL (ref 32.0–36.0)
MCV: 93 fL (ref 80–100)
Monocyte #: 0.4 x10 3/mm (ref 0.2–0.9)
Monocyte %: 6.4 %
Neutrophil #: 5.1 x10 3/mm (ref 1.4–6.5)
Platelet: 245 x10 3/mm (ref 150–440)
RDW: 14.7 % — ABNORMAL HIGH (ref 11.5–14.5)
WBC: 6.7 x10 3/mm (ref 3.6–11.0)

## 2013-05-22 LAB — COMPREHENSIVE METABOLIC PANEL
Alkaline Phosphatase: 88 U/L (ref 50–136)
BUN: 12 mg/dL (ref 7–18)
Bilirubin,Total: 0.5 mg/dL (ref 0.2–1.0)
Calcium, Total: 8.7 mg/dL (ref 8.5–10.1)
Co2: 26 mmol/L (ref 21–32)
EGFR (African American): 59 — ABNORMAL LOW
EGFR (Non-African Amer.): 51 — ABNORMAL LOW
Glucose: 208 mg/dL — ABNORMAL HIGH (ref 65–99)
Osmolality: 276 (ref 275–301)
SGPT (ALT): 32 U/L (ref 12–78)
Total Protein: 6.4 g/dL (ref 6.4–8.2)

## 2013-05-24 ENCOUNTER — Encounter: Payer: Self-pay | Admitting: Internal Medicine

## 2013-05-24 ENCOUNTER — Encounter (INDEPENDENT_AMBULATORY_CARE_PROVIDER_SITE_OTHER): Payer: Self-pay

## 2013-05-24 ENCOUNTER — Ambulatory Visit (INDEPENDENT_AMBULATORY_CARE_PROVIDER_SITE_OTHER): Payer: Medicare Other | Admitting: Internal Medicine

## 2013-05-24 VITALS — BP 140/72 | HR 91 | Temp 97.7°F | Ht 62.0 in | Wt 172.0 lb

## 2013-05-24 DIAGNOSIS — H9319 Tinnitus, unspecified ear: Secondary | ICD-10-CM | POA: Insufficient documentation

## 2013-05-24 DIAGNOSIS — I8310 Varicose veins of unspecified lower extremity with inflammation: Secondary | ICD-10-CM

## 2013-05-24 DIAGNOSIS — E785 Hyperlipidemia, unspecified: Secondary | ICD-10-CM

## 2013-05-24 DIAGNOSIS — E119 Type 2 diabetes mellitus without complications: Secondary | ICD-10-CM

## 2013-05-24 DIAGNOSIS — I10 Essential (primary) hypertension: Secondary | ICD-10-CM

## 2013-05-24 DIAGNOSIS — H9313 Tinnitus, bilateral: Secondary | ICD-10-CM

## 2013-05-24 DIAGNOSIS — I872 Venous insufficiency (chronic) (peripheral): Secondary | ICD-10-CM | POA: Insufficient documentation

## 2013-05-24 DIAGNOSIS — C50419 Malignant neoplasm of upper-outer quadrant of unspecified female breast: Secondary | ICD-10-CM

## 2013-05-24 MED ORDER — TRIAMCINOLONE 0.1 % CREAM:EUCERIN CREAM 1:1: 1.0000 "application " | TOPICAL_CREAM | Freq: Two times a day (BID) | CUTANEOUS | Status: DC | PRN

## 2013-05-24 MED ORDER — GLIMEPIRIDE 2 MG PO TABS: 2.0000 mg | ORAL_TABLET | Freq: Two times a day (BID) | ORAL | Status: DC

## 2013-05-24 NOTE — Assessment & Plan Note (Signed)
Symptoms or erythematous rash bilateral LE most consistent with stasis dermatitis likely exacerbated by the recent colder temperatures. Will start topical triamcinolone-eucerin cream. Pt will call if symptoms are not improving.

## 2013-05-24 NOTE — Progress Notes (Signed)
Pre-visit discussion using our clinic review tool. No additional management support is needed unless otherwise documented below in the visit note.  

## 2013-05-24 NOTE — Assessment & Plan Note (Signed)
Blood sugars generally controlled with A1c 7.6%. Will continue Metformin and Glimepiride. Foot exam normal 01/2013. Eye exam pending. Follow up 3 months and prn.

## 2013-05-24 NOTE — Progress Notes (Signed)
Subjective:    Patient ID: Kelsey Mason, female    DOB: 1940-10-03, 72 y.o.   MRN: [id number]  HPI 72 year old female with history of breast cancer currently undergoing chemotherapy, diabetes, hypertension, hyperlipidemia presents for followup. She reports that she is generally feeling well. She continues on chemotherapy with Taxol. Over the last couple of weeks she has had some ringing in both of her ears and occasional episodes of dizziness. She denies any ear pain, change in hearing, fever, chills. She denies nasal congestion or cough. She also reports a red itchy rash over both of her lower legs. She denies use of any new lotions or creams. She has not been applying anything to her rash.  Aside from this, she is feeling well. Energy level is improved. She reports blood sugars have been well controlled. Recent A1c showed stable sugars with A1c of 7.6%.  Outpatient Encounter Prescriptions as of 05/24/2013  Medication Sig  . Acetaminophen (ARTHRITIS PAIN RELIEF PO) Take 650 mg by mouth as needed.  . ALPRAZolam (XANAX) 0.25 MG tablet Take 0.25 mg by mouth at bedtime as needed for sleep.   [name] amLODipine-benazepril (LOTREL) 5-20 MG per capsule Take 1 capsule by mouth daily.  [name] aspirin 81 MG tablet Take 81 mg by mouth daily.  . Cyanocobalamin (VITAMIN B-12) 2500 MCG SUBL Place 2,500 mcg under the tongue daily.  . cyanocobalamin 2000 MCG tablet Take by mouth daily.   [name] glimepiride (AMARYL) 2 MG tablet Take 1 tablet (2 mg total) by mouth 2 (two) times daily.  [name] glucose blood (BAYER CONTOUR NEXT TEST) test strip Patient will use to check blood sugars at home 2-3 times daily. Dx. Code 250.00  . metFORMIN (GLUCOPHAGE) 1000 MG tablet Take 0.5 tablets (500 mg total) by mouth 2 (two) times daily.  . ondansetron (ZOFRAN) 4 MG tablet Take 1 tablet by mouth daily.  . promethazine (PHENERGAN) 25 MG tablet Take 25 mg by mouth every 6 (six) hours as needed for nausea.  . rosuvastatin (CRESTOR) 10 MG tablet  Take 1 tablet (10 mg total) by mouth daily.  . traMADol (ULTRAM) 50 MG tablet   . [DISCONTINUED] glimepiride (AMARYL) 2 MG tablet Take 1 tablet (2 mg total) by mouth 2 (two) times daily.  . Triamcinolone Acetonide (TRIAMCINOLONE 0.1 % CREAM : EUCERIN) CREA Apply 1 application topically 2 (two) times daily as needed.   BP 140/72  Pulse 91  Temp(Src) 97.7 F (36.5 C) (Oral)  Ht 5\' 2"  (1.575 m)  Wt 172 lb (78.019 kg)  BMI 31.45 kg/m2  SpO2 99%  Review of Systems  Constitutional: Negative for fever, chills, appetite change, fatigue and unexpected weight change.  HENT: Positive for tinnitus. Negative for congestion, ear pain, sinus pressure, sore throat, trouble swallowing and voice change.   Eyes: Negative for visual disturbance.  Respiratory: Negative for cough, shortness of breath, wheezing and stridor.   Cardiovascular: Negative for chest pain, palpitations and leg swelling.  Gastrointestinal: Negative for nausea, vomiting, abdominal pain, diarrhea, constipation, blood in stool, abdominal distention and anal bleeding.  Genitourinary: Negative for dysuria and flank pain.  Musculoskeletal: Negative for arthralgias, gait problem, myalgias and neck pain.  Skin: Positive for color change (redness bilateral LE) and rash.  Neurological: Positive for dizziness (intermittent). Negative for headaches.  Hematological: Negative for adenopathy. Does not bruise/bleed easily.  Psychiatric/Behavioral: Negative for suicidal ideas, sleep disturbance and dysphoric mood. The patient is not nervous/anxious.        Objective:   Physical Exam  Constitutional: She is oriented to person, place, and time. She appears well-developed and well-nourished. No distress.  HENT:  Head: Normocephalic and atraumatic.  Right Ear: External ear normal.  Left Ear: External ear normal.  Nose: Nose normal.  Mouth/Throat: Oropharynx is clear and moist. No oropharyngeal exudate.  Eyes: Conjunctivae are normal. Pupils are  equal, round, and reactive to light. Right eye exhibits no discharge. Left eye exhibits no discharge. No scleral icterus.  Neck: Normal range of motion. Neck supple. No tracheal deviation present. No thyromegaly present.  Cardiovascular: Normal rate, regular rhythm, normal heart sounds and intact distal pulses.  Exam reveals no gallop and no friction rub.   No murmur heard. Pulmonary/Chest: Effort normal and breath sounds normal. No accessory muscle usage. Not tachypneic. No respiratory distress. She has no decreased breath sounds. She has no wheezes. She has no rhonchi. She has no rales. She exhibits no tenderness.  Musculoskeletal: Normal range of motion. She exhibits no edema and no tenderness.  Lymphadenopathy:    She has no cervical adenopathy.  Neurological: She is alert and oriented to person, place, and time. No cranial nerve deficit. She exhibits normal muscle tone. Coordination normal.  Skin: Skin is warm and dry. Rash noted. Rash is maculopapular (with underlying hemosiderin staining). She is not diaphoretic. There is erythema (bilateral LE). No pallor.  Psychiatric: She has a normal mood and affect. Her behavior is normal. Judgment and thought content normal.          Assessment & Plan:

## 2013-05-24 NOTE — Assessment & Plan Note (Signed)
Continues with chemotherapy on Taxol. Tolerating well. Will continue to monitor.

## 2013-05-24 NOTE — Assessment & Plan Note (Signed)
Suspect symptoms related to recent chemotherapy with Taxol. Exam today remarkable for bilateral cerumen impaction. Offered warm water lavage, however pt declines and would like to use Debrox at home. Follow up prn.

## 2013-05-24 NOTE — Assessment & Plan Note (Signed)
BP Readings from Last 3 Encounters:  05/24/13 140/72  04/26/13 162/72  03/28/13 160/80   BP well controlled on current medications. Recent renal function stable. Follow up 3 months and prn.

## 2013-05-29 LAB — CBC CANCER CENTER
Basophil #: 0.1 x10 3/mm (ref 0.0–0.1)
Basophil %: 1.2 %
Eosinophil #: 0.2 x10 3/mm (ref 0.0–0.7)
HGB: 9.8 g/dL — ABNORMAL LOW (ref 12.0–16.0)
Lymphocyte %: 12.7 %
MCHC: 34.2 g/dL (ref 32.0–36.0)
Monocyte #: 0.5 x10 3/mm (ref 0.2–0.9)
Monocyte %: 7 %
Neutrophil #: 5.3 x10 3/mm (ref 1.4–6.5)
Neutrophil %: 75.5 %
Platelet: 264 x10 3/mm (ref 150–440)

## 2013-06-04 ENCOUNTER — Ambulatory Visit: Payer: Self-pay | Admitting: Oncology

## 2013-06-05 LAB — CBC CANCER CENTER
Basophil #: 0.1 x10 3/mm (ref 0.0–0.1)
Basophil %: 0.9 %
Eosinophil #: 0.2 x10 3/mm (ref 0.0–0.7)
HGB: 10 g/dL — ABNORMAL LOW (ref 12.0–16.0)
Lymphocyte #: 1 x10 3/mm (ref 1.0–3.6)
Lymphocyte %: 12.7 %
MCH: 33 pg (ref 26.0–34.0)
MCHC: 34.5 g/dL (ref 32.0–36.0)
MCV: 96 fL (ref 80–100)
Monocyte #: 0.5 x10 3/mm (ref 0.2–0.9)
Monocyte %: 6.9 %
Neutrophil #: 5.9 x10 3/mm (ref 1.4–6.5)
Neutrophil %: 76.8 %
Platelet: 249 x10 3/mm (ref 150–440)
RBC: 3.03 10*6/uL — ABNORMAL LOW (ref 3.80–5.20)

## 2013-06-05 LAB — COMPREHENSIVE METABOLIC PANEL
Albumin: 3.8 g/dL (ref 3.4–5.0)
Alkaline Phosphatase: 82 U/L
Anion Gap: 9 (ref 7–16)
BUN: 17 mg/dL (ref 7–18)
Bilirubin,Total: 0.5 mg/dL (ref 0.2–1.0)
Calcium, Total: 8.8 mg/dL (ref 8.5–10.1)
Co2: 25 mmol/L (ref 21–32)
Creatinine: 1.22 mg/dL (ref 0.60–1.30)
EGFR (Non-African Amer.): 44 — ABNORMAL LOW
Glucose: 337 mg/dL — ABNORMAL HIGH (ref 65–99)
Osmolality: 281 (ref 275–301)
SGOT(AST): 15 U/L (ref 15–37)
SGPT (ALT): 33 U/L (ref 12–78)
Total Protein: 6.3 g/dL — ABNORMAL LOW (ref 6.4–8.2)

## 2013-06-15 ENCOUNTER — Encounter: Payer: Self-pay | Admitting: General Surgery

## 2013-06-15 ENCOUNTER — Ambulatory Visit (INDEPENDENT_AMBULATORY_CARE_PROVIDER_SITE_OTHER): Payer: Medicare Other | Admitting: General Surgery

## 2013-06-15 VITALS — BP 140/74 | HR 74 | Resp 14 | Ht 62.0 in | Wt 170.0 lb

## 2013-06-15 DIAGNOSIS — C50419 Malignant neoplasm of upper-outer quadrant of unspecified female breast: Secondary | ICD-10-CM

## 2013-06-15 DIAGNOSIS — C50312 Malignant neoplasm of lower-inner quadrant of left female breast: Secondary | ICD-10-CM

## 2013-06-15 DIAGNOSIS — C50411 Malignant neoplasm of upper-outer quadrant of right female breast: Secondary | ICD-10-CM

## 2013-06-15 DIAGNOSIS — C50319 Malignant neoplasm of lower-inner quadrant of unspecified female breast: Secondary | ICD-10-CM

## 2013-06-15 NOTE — Progress Notes (Signed)
This is a 72 year old female here today for her port removal. She has completed chemotherapy for her bilateral breast cancer treated with lumpectomy initially. Patient is due to start bilateral breast radiation next week or so.   Procedure- removal of Port-A-Cath  Anesthetic: 10 mL of 1% Xylocaine mixed with 0.5% Marcaine  Prep: ChloraPrep  After prep and drapong the area prior incision was reopened. The port was freed and anchoring stitches removed. Port with this catheter was removed in its entirety. No bleeding encountered. Wound closed with 3-0 Vicryl in the subcutaneous tissue and 4-0 vicryl subcuticular. Procedure well tolerated with no immediate problems encountered.  Dressing: Steri-Strips Telfa and Tegaderm

## 2013-06-21 ENCOUNTER — Ambulatory Visit: Payer: Medicare Other | Admitting: General Surgery

## 2013-07-03 ENCOUNTER — Telehealth: Payer: Self-pay | Admitting: General Surgery

## 2013-07-03 NOTE — Telephone Encounter (Signed)
error 

## 2013-07-05 ENCOUNTER — Ambulatory Visit: Payer: Self-pay | Admitting: Oncology

## 2013-07-19 LAB — CBC CANCER CENTER
Basophil #: 0.1 x10 3/mm (ref 0.0–0.1)
Basophil %: 0.6 %
EOS PCT: 5.9 %
Eosinophil #: 0.7 x10 3/mm (ref 0.0–0.7)
HCT: 37.3 % (ref 35.0–47.0)
HGB: 12.4 g/dL (ref 12.0–16.0)
LYMPHS ABS: 1.3 x10 3/mm (ref 1.0–3.6)
Lymphocyte %: 10.3 %
MCH: 29.9 pg (ref 26.0–34.0)
MCHC: 33.1 g/dL (ref 32.0–36.0)
MCV: 90 fL (ref 80–100)
MONOS PCT: 5.4 %
Monocyte #: 0.7 x10 3/mm (ref 0.2–0.9)
NEUTROS ABS: 9.6 x10 3/mm — AB (ref 1.4–6.5)
Neutrophil %: 77.8 %
Platelet: 225 x10 3/mm (ref 150–440)
RBC: 4.13 10*6/uL (ref 3.80–5.20)
RDW: 14 % (ref 11.5–14.5)
WBC: 12.3 x10 3/mm — ABNORMAL HIGH (ref 3.6–11.0)

## 2013-07-24 ENCOUNTER — Ambulatory Visit: Payer: Medicare Other | Admitting: General Surgery

## 2013-07-26 LAB — CBC CANCER CENTER
BASOS ABS: 0.1 x10 3/mm (ref 0.0–0.1)
BASOS PCT: 1.1 %
EOS ABS: 1.1 x10 3/mm — AB (ref 0.0–0.7)
EOS PCT: 11.7 %
HCT: 36.3 % (ref 35.0–47.0)
HGB: 12.1 g/dL (ref 12.0–16.0)
LYMPHS ABS: 0.9 x10 3/mm — AB (ref 1.0–3.6)
LYMPHS PCT: 9.7 %
MCH: 29.9 pg (ref 26.0–34.0)
MCHC: 33.4 g/dL (ref 32.0–36.0)
MCV: 89 fL (ref 80–100)
Monocyte #: 0.6 x10 3/mm (ref 0.2–0.9)
Monocyte %: 6.3 %
NEUTROS ABS: 6.6 x10 3/mm — AB (ref 1.4–6.5)
NEUTROS PCT: 71.2 %
PLATELETS: 202 x10 3/mm (ref 150–440)
RBC: 4.06 10*6/uL (ref 3.80–5.20)
RDW: 13.9 % (ref 11.5–14.5)
WBC: 9.3 x10 3/mm (ref 3.6–11.0)

## 2013-07-31 LAB — COMPREHENSIVE METABOLIC PANEL
ALK PHOS: 78 U/L
ANION GAP: 7 (ref 7–16)
Albumin: 3.8 g/dL (ref 3.4–5.0)
BUN: 14 mg/dL (ref 7–18)
Bilirubin,Total: 0.5 mg/dL (ref 0.2–1.0)
Calcium, Total: 8.4 mg/dL — ABNORMAL LOW (ref 8.5–10.1)
Chloride: 100 mmol/L (ref 98–107)
Co2: 26 mmol/L (ref 21–32)
Creatinine: 0.98 mg/dL (ref 0.60–1.30)
EGFR (African American): >60
GFR CALC NON AF AMER: 58 — AB
GLUCOSE: 181 mg/dL — AB (ref 65–99)
Osmolality: 271 (ref 275–301)
Potassium: 4 mmol/L (ref 3.5–5.1)
SGOT(AST): 20 U/L (ref 15–37)
SGPT (ALT): 29 U/L (ref 12–78)
SODIUM: 133 mmol/L — AB (ref 136–145)
Total Protein: 6.4 g/dL (ref 6.4–8.2)

## 2013-07-31 LAB — CBC CANCER CENTER
BASOS PCT: 0.9 %
Basophil #: 0.1 x10 3/mm (ref 0.0–0.1)
EOS ABS: 1.1 x10 3/mm — AB (ref 0.0–0.7)
EOS PCT: 13.3 %
HCT: 36.7 % (ref 35.0–47.0)
HGB: 12.4 g/dL (ref 12.0–16.0)
Lymphocyte #: 1 x10 3/mm (ref 1.0–3.6)
Lymphocyte %: 11.4 %
MCH: 30.5 pg (ref 26.0–34.0)
MCHC: 33.8 g/dL (ref 32.0–36.0)
MCV: 90 fL (ref 80–100)
Monocyte #: 0.5 x10 3/mm (ref 0.2–0.9)
Monocyte %: 5.4 %
NEUTROS ABS: 6 x10 3/mm (ref 1.4–6.5)
NEUTROS PCT: 69 %
PLATELETS: 181 x10 3/mm (ref 150–440)
RBC: 4.08 10*6/uL (ref 3.80–5.20)
RDW: 13.8 % (ref 11.5–14.5)
WBC: 8.6 x10 3/mm (ref 3.6–11.0)

## 2013-08-05 ENCOUNTER — Ambulatory Visit: Payer: Self-pay | Admitting: Oncology

## 2013-08-09 LAB — CBC CANCER CENTER
BASOS ABS: 0.1 x10 3/mm (ref 0.0–0.1)
Basophil %: 0.9 %
Eosinophil #: 1 x10 3/mm — ABNORMAL HIGH (ref 0.0–0.7)
Eosinophil %: 10.4 %
HCT: 37.7 % (ref 35.0–47.0)
HGB: 12.7 g/dL (ref 12.0–16.0)
LYMPHS ABS: 1 x10 3/mm (ref 1.0–3.6)
Lymphocyte %: 11.1 %
MCH: 30.1 pg (ref 26.0–34.0)
MCHC: 33.8 g/dL (ref 32.0–36.0)
MCV: 89 fL (ref 80–100)
MONOS PCT: 6.2 %
Monocyte #: 0.6 x10 3/mm (ref 0.2–0.9)
NEUTROS PCT: 71.4 %
Neutrophil #: 6.7 x10 3/mm — ABNORMAL HIGH (ref 1.4–6.5)
Platelet: 206 x10 3/mm (ref 150–440)
RBC: 4.22 10*6/uL (ref 3.80–5.20)
RDW: 13.5 % (ref 11.5–14.5)
WBC: 9.4 x10 3/mm (ref 3.6–11.0)

## 2013-08-16 LAB — CBC CANCER CENTER
BASOS ABS: 0 x10 3/mm (ref 0.0–0.1)
BASOS PCT: 0.3 %
Eosinophil #: 0.9 x10 3/mm — ABNORMAL HIGH (ref 0.0–0.7)
Eosinophil %: 8 %
HCT: 38.1 % (ref 35.0–47.0)
HGB: 12.5 g/dL (ref 12.0–16.0)
LYMPHS ABS: 0.9 x10 3/mm — AB (ref 1.0–3.6)
Lymphocyte %: 8 %
MCH: 29.3 pg (ref 26.0–34.0)
MCHC: 32.9 g/dL (ref 32.0–36.0)
MCV: 89 fL (ref 80–100)
Monocyte #: 0.7 x10 3/mm (ref 0.2–0.9)
Monocyte %: 6 %
Neutrophil #: 8.7 x10 3/mm — ABNORMAL HIGH (ref 1.4–6.5)
Neutrophil %: 77.7 %
Platelet: 187 x10 3/mm (ref 150–440)
RBC: 4.27 10*6/uL (ref 3.80–5.20)
RDW: 13.5 % (ref 11.5–14.5)
WBC: 11.2 x10 3/mm — ABNORMAL HIGH (ref 3.6–11.0)

## 2013-08-23 LAB — CBC CANCER CENTER
BASOS ABS: 0.1 x10 3/mm (ref 0.0–0.1)
Basophil %: 0.6 %
EOS ABS: 1 x10 3/mm — AB (ref 0.0–0.7)
EOS PCT: 10.2 %
HCT: 38.8 % (ref 35.0–47.0)
HGB: 12.8 g/dL (ref 12.0–16.0)
Lymphocyte #: 1 x10 3/mm (ref 1.0–3.6)
Lymphocyte %: 10.3 %
MCH: 29.6 pg (ref 26.0–34.0)
MCHC: 32.9 g/dL (ref 32.0–36.0)
MCV: 90 fL (ref 80–100)
Monocyte #: 0.6 x10 3/mm (ref 0.2–0.9)
Monocyte %: 6.2 %
NEUTROS ABS: 7.2 x10 3/mm — AB (ref 1.4–6.5)
Neutrophil %: 72.7 %
PLATELETS: 200 x10 3/mm (ref 150–440)
RBC: 4.31 10*6/uL (ref 3.80–5.20)
RDW: 13.4 % (ref 11.5–14.5)
WBC: 9.9 x10 3/mm (ref 3.6–11.0)

## 2013-09-02 ENCOUNTER — Ambulatory Visit: Payer: Self-pay | Admitting: Oncology

## 2013-09-06 ENCOUNTER — Ambulatory Visit (INDEPENDENT_AMBULATORY_CARE_PROVIDER_SITE_OTHER): Payer: Medicare Other | Admitting: Internal Medicine

## 2013-09-06 ENCOUNTER — Encounter (INDEPENDENT_AMBULATORY_CARE_PROVIDER_SITE_OTHER): Payer: Self-pay

## 2013-09-06 ENCOUNTER — Encounter: Payer: Self-pay | Admitting: Internal Medicine

## 2013-09-06 VITALS — BP 158/82 | HR 88 | Temp 98.3°F | Ht 62.0 in | Wt 166.5 lb

## 2013-09-06 DIAGNOSIS — G620 Drug-induced polyneuropathy: Secondary | ICD-10-CM

## 2013-09-06 DIAGNOSIS — I10 Essential (primary) hypertension: Secondary | ICD-10-CM

## 2013-09-06 DIAGNOSIS — T451X5A Adverse effect of antineoplastic and immunosuppressive drugs, initial encounter: Secondary | ICD-10-CM

## 2013-09-06 DIAGNOSIS — E119 Type 2 diabetes mellitus without complications: Secondary | ICD-10-CM

## 2013-09-06 DIAGNOSIS — T2101XA Burn of unspecified degree of chest wall, initial encounter: Secondary | ICD-10-CM

## 2013-09-06 LAB — MICROALBUMIN / CREATININE URINE RATIO
Creatinine,U: 236.3 mg/dL
MICROALB/CREAT RATIO: 2.9 mg/g (ref 0.0–30.0)
Microalb, Ur: 6.9 mg/dL — ABNORMAL HIGH (ref 0.0–1.9)

## 2013-09-06 LAB — COMPREHENSIVE METABOLIC PANEL
ALT: 18 U/L (ref 0–35)
AST: 13 U/L (ref 0–37)
Albumin: 4 g/dL (ref 3.5–5.2)
Alkaline Phosphatase: 65 U/L (ref 39–117)
BILIRUBIN TOTAL: 0.7 mg/dL (ref 0.3–1.2)
BUN: 15 mg/dL (ref 6–23)
CALCIUM: 9.4 mg/dL (ref 8.4–10.5)
CHLORIDE: 102 meq/L (ref 96–112)
CO2: 26 mEq/L (ref 19–32)
CREATININE: 1 mg/dL (ref 0.4–1.2)
GFR: 56.54 mL/min — AB (ref >60.00)
Glucose, Bld: 204 mg/dL — ABNORMAL HIGH (ref 70–99)
Potassium: 4.4 mEq/L (ref 3.5–5.1)
Sodium: 135 mEq/L (ref 135–145)
Total Protein: 6.3 g/dL (ref 6.0–8.3)

## 2013-09-06 LAB — LIPID PANEL
CHOLESTEROL: 134 mg/dL (ref 0–200)
HDL: 48.1 mg/dL (ref >39.00)
LDL Cholesterol: 68 mg/dL (ref 0–99)
TRIGLYCERIDES: 92 mg/dL (ref 0.0–149.0)
Total CHOL/HDL Ratio: 3
VLDL: 18.4 mg/dL (ref 0.0–40.0)

## 2013-09-06 LAB — HEMOGLOBIN A1C: Hgb A1c MFr Bld: 8.1 % — ABNORMAL HIGH (ref 4.6–6.5)

## 2013-09-06 NOTE — Progress Notes (Signed)
Subjective:    Patient ID: Kelsey Mason, female    DOB: Nov 22, 1940, 73 y.o.   MRN: [id number]  HPI 73YO female with h/o breast cancer, diabetes, hypertension presents for follow up. She recently completed radiation. Has some radiation burns under breasts. Using [facility] salve with some improvement.  Notes that fingers and feet have felt numb since completing chemo. Not taking anything for this. Was told by oncologist this would improve.  Otherwise feeling well.  DM - BG slightly elevated recently near 180s.  Review of Systems  Constitutional: Negative for fever, chills, appetite change, fatigue and unexpected weight change.  HENT: Negative for congestion, ear pain, sinus pressure, sore throat, trouble swallowing and voice change.   Eyes: Negative for visual disturbance.  Respiratory: Negative for cough, shortness of breath, wheezing and stridor.   Cardiovascular: Negative for chest pain, palpitations and leg swelling.  Gastrointestinal: Negative for nausea, vomiting, abdominal pain, diarrhea, constipation, blood in stool, abdominal distention and anal bleeding.  Genitourinary: Negative for dysuria and flank pain.  Musculoskeletal: Negative for arthralgias, gait problem, myalgias and neck pain.  Skin: Positive for color change and wound. Negative for rash.  Neurological: Negative for dizziness and headaches.  Hematological: Negative for adenopathy. Does not bruise/bleed easily.  Psychiatric/Behavioral: Negative for suicidal ideas, sleep disturbance and dysphoric mood. The patient is not nervous/anxious.        Objective:    BP 158/82  Pulse 88  Temp(Src) 98.3 F (36.8 C) (Oral)  Ht 5\' 2"  (1.575 m)  Wt 166 lb 8 oz (75.524 kg)  BMI 30.45 kg/m2  SpO2 98% Physical Exam  Constitutional: She is oriented to person, place, and time. She appears well-developed and well-nourished. No distress.  HENT:  Head: Normocephalic and atraumatic.  Right Ear: External ear normal.  Left  Ear: External ear normal.  Nose: Nose normal.  Mouth/Throat: Oropharynx is clear and moist. No oropharyngeal exudate.  Eyes: Conjunctivae are normal. Pupils are equal, round, and reactive to light. Right eye exhibits no discharge. Left eye exhibits no discharge. No scleral icterus.  Neck: Normal range of motion. Neck supple. No tracheal deviation present. No thyromegaly present.  Cardiovascular: Normal rate, regular rhythm, normal heart sounds and intact distal pulses.  Exam reveals no gallop and no friction rub.   No murmur heard. Pulmonary/Chest: Effort normal and breath sounds normal. No respiratory distress. She has no wheezes. She has no rales. She exhibits no tenderness.  Musculoskeletal: Normal range of motion. She exhibits no edema and no tenderness.  Lymphadenopathy:    She has no cervical adenopathy.  Neurological: She is alert and oriented to person, place, and time. No cranial nerve deficit. She exhibits normal muscle tone. Coordination normal.  Skin: Skin is warm and dry. Burn noted. No rash noted. She is not diaphoretic. There is erythema. No pallor.     Psychiatric: She has a normal mood and affect. Her behavior is normal. Judgment and thought content normal.          Assessment & Plan:   Problem List Items Addressed This Visit   Burn of chest wall     Secondary to radiation therapy. Appears to be healing. Will continue topical salve. Follow up with radiation oncologist as scheduled.    Diabetes mellitus type 2, controlled - Primary     Will check A1c with labs today. Continue current medications.    Relevant Orders      Comprehensive metabolic panel      Hemoglobin A1c  Lipid panel      Microalbumin / creatinine urine ratio   Hypertension      BP Readings from Last 3 Encounters:  09/06/13 158/82  06/15/13 140/74  05/24/13 140/72   BP slightly elevated today, but has been well controlled throughout recent chemotherapy and radiation when monitored. Will  continue Amlodipine-Benazepril. Check renal function with labs today.    Neuropathy due to chemotherapeutic drug     Recent neuropathic pain after receiving taxol. Discussed possibly adding Neurontin, however pt would like to hold off for now.        Return in about 3 months (around 12/07/2013) for Recheck of Diabetes.

## 2013-09-06 NOTE — Assessment & Plan Note (Signed)
Secondary to radiation therapy. Appears to be healing. Will continue topical salve. Follow up with radiation oncologist as scheduled.

## 2013-09-06 NOTE — Progress Notes (Signed)
Pre-visit discussion using our clinic review tool. No additional management support is needed unless otherwise documented below in the visit note.  

## 2013-09-06 NOTE — Assessment & Plan Note (Signed)
Will check A1c with labs today. Continue current medications. 

## 2013-09-06 NOTE — Assessment & Plan Note (Signed)
BP Readings from Last 3 Encounters:  09/06/13 158/82  06/15/13 140/74  05/24/13 140/72   BP slightly elevated today, but has been well controlled throughout recent chemotherapy and radiation when monitored. Will continue Amlodipine-Benazepril. Check renal function with labs today.

## 2013-09-06 NOTE — Assessment & Plan Note (Signed)
Recent neuropathic pain after receiving taxol. Discussed possibly adding Neurontin, however pt would like to hold off for now.

## 2013-09-07 ENCOUNTER — Telehealth: Payer: Self-pay

## 2013-09-07 ENCOUNTER — Telehealth: Payer: Self-pay | Admitting: Internal Medicine

## 2013-09-07 NOTE — Telephone Encounter (Signed)
Relevant patient education assigned to patient using Emmi. ° °

## 2013-09-11 ENCOUNTER — Encounter: Payer: Self-pay | Admitting: General Surgery

## 2013-09-19 ENCOUNTER — Ambulatory Visit (INDEPENDENT_AMBULATORY_CARE_PROVIDER_SITE_OTHER): Payer: Medicare Other | Admitting: General Surgery

## 2013-09-19 ENCOUNTER — Encounter: Payer: Self-pay | Admitting: General Surgery

## 2013-09-19 VITALS — BP 130/72 | HR 74 | Resp 14 | Ht 62.0 in | Wt 183.0 lb

## 2013-09-19 DIAGNOSIS — C50919 Malignant neoplasm of unspecified site of unspecified female breast: Secondary | ICD-10-CM

## 2013-09-19 NOTE — Progress Notes (Signed)
Pt has undergone bilateral lumpectomy for CA , chemo and radiation abou She is No complaints.Patient ID: Kelsey Mason, female   DOB: 08/26/1940, 73 y.o.   MRN: [id number]  Chief Complaint  Patient presents with  . Follow-up    mammogram    HPI Kelsey Mason is a 73 y.o. female who presents for a breast evaluation. The most recent mammogram was done on 09/12/13 at [facility]. Patient does perform regular self breast checks and gets regular mammograms done.    HPI  Past Medical History  Diagnosis Date  . Diabetes mellitus   . Hypertension   . Breast symptom 2014  . Cancer     Bilateral Breast, chemo Dr. Jeb Levering    Past Surgical History  Procedure Laterality Date  . Cholecystectomy  2007  . Abdominal hysterectomy  1976    for pelvic pain  . Tumor removal Right 1980    right foot  . Oophorectomy Right   . Breast surgery Bilateral 2014  . Portacath placement  2014    Family History  Problem Relation Age of Onset  . Alcohol abuse Mother   . Heart disease Mother   . Hypertension Mother   . Alcohol abuse Father   . Heart disease Father   . Hypertension Father   . Heart disease Brother     Social History History  Substance Use Topics  . Smoking status: Former Smoker -- 0.25 packs/day for 1 years    Types: Cigarettes  . Smokeless tobacco: Never Used  . Alcohol Use: No    Allergies  Allergen Reactions  . Advil [Ibuprofen] Other (See Comments)    Dizziness   . Aleve [Naproxen] Other (See Comments)    Dizziness  . Lipitor [Atorvastatin] Other (See Comments)    Leg pain, muscle ache  . Penicillins Hives    Current Outpatient Prescriptions  Medication Sig Dispense Refill  . Acetaminophen (ARTHRITIS PAIN RELIEF PO) Take 650 mg by mouth as needed.      . ALPRAZolam (XANAX) 0.25 MG tablet Take 0.25 mg by mouth at bedtime as needed for sleep.       [name] amLODipine-benazepril (LOTREL) 5-20 MG per capsule Take 1 capsule by mouth daily.  90 capsule  2  . aspirin 81 MG  tablet Take 81 mg by mouth daily.      . Cyanocobalamin (VITAMIN B-12) 2500 MCG SUBL Place 2,500 mcg under the tongue daily.      [name] glimepiride (AMARYL) 2 MG tablet Take 1 tablet (2 mg total) by mouth 2 (two) times daily.  180 tablet  4  . glucose blood (BAYER CONTOUR NEXT TEST) test strip Patient will use to check blood sugars at home 2-3 times daily. Dx. Code 250.00  100 each  12  . metFORMIN (GLUCOPHAGE) 1000 MG tablet Take 0.5 tablets (500 mg total) by mouth 2 (two) times daily.  180 tablet  2  . rosuvastatin (CRESTOR) 10 MG tablet Take 1 tablet (10 mg total) by mouth daily.  90 tablet  4  . traMADol (ULTRAM) 50 MG tablet       . Triamcinolone Acetonide (TRIAMCINOLONE 0.1 % CREAM : EUCERIN) CREA Apply 1 application topically 2 (two) times daily as needed.  1 each  1   No current facility-administered medications for this visit.    Review of Systems Review of Systems  Constitutional: Negative.   Respiratory: Negative.   Cardiovascular: Negative.     Blood pressure 130/72, pulse 74, resp. rate 14, height  5\' 2"  (1.575 m), weight 183 lb (83.008 kg).  Physical Exam Physical Exam  Constitutional: She is oriented to person, place, and time. She appears well-developed and well-nourished.  Eyes: Conjunctivae are normal.  Neck: Neck supple.  Cardiovascular: Normal heart sounds, intact distal pulses and normal pulses.  An irregular rhythm present.  Pulses:      Dorsalis pedis pulses are 2+ on the right side, and 2+ on the left side.       Posterior tibial pulses are 2+ on the right side, and 2+ on the left side.  Irregular beat, occasional missed beat.  No leg edema or varicose veins or venous problems.   Pulmonary/Chest: Breath sounds normal. Right breast exhibits no inverted nipple, no mass, no nipple discharge, no skin change and no tenderness. Left breast exhibits no inverted nipple, no mass, no nipple discharge, no skin change and no tenderness.  Abdominal: Soft. There is no  hepatosplenomegaly. There is no tenderness. No hernia.  Lymphadenopathy:    She has no cervical adenopathy.    She has no axillary adenopathy.  Neurological: She is alert and oriented to person, place, and time.  Skin: Skin is warm and dry.    Data Reviewed Mammogram reviewed  Assessment    Stable exam. 1 year post treatment for bilateral breast cancer-lumpectomy radiation and chemotherapy. Patient is due to start on aromatase inhibitors.     Plan   Patient to return in 6 months with bilateral diagnostic mammogram.  SANKAR,SEEPLAPUTHUR G 09/20/2013, 5:30 AM

## 2013-09-19 NOTE — Patient Instructions (Addendum)
Patient to return in 6 months for bilateral diagnostic mammogram. Continue self breast exams. Call office for any new breast issues or concerns.

## 2013-09-20 ENCOUNTER — Encounter: Payer: Self-pay | Admitting: General Surgery

## 2013-09-25 LAB — CBC CANCER CENTER
BASOS ABS: 0 x10 3/mm (ref 0.0–0.1)
BASOS PCT: 0.6 %
Eosinophil #: 0.5 x10 3/mm (ref 0.0–0.7)
Eosinophil %: 6.4 %
HCT: 38.3 % (ref 35.0–47.0)
HGB: 12.6 g/dL (ref 12.0–16.0)
Lymphocyte #: 1.1 x10 3/mm (ref 1.0–3.6)
Lymphocyte %: 13.8 %
MCH: 29 pg (ref 26.0–34.0)
MCHC: 32.9 g/dL (ref 32.0–36.0)
MCV: 88 fL (ref 80–100)
MONO ABS: 0.6 x10 3/mm (ref 0.2–0.9)
Monocyte %: 7.7 %
NEUTROS ABS: 5.5 x10 3/mm (ref 1.4–6.5)
Neutrophil %: 71.5 %
Platelet: 209 x10 3/mm (ref 150–440)
RBC: 4.35 10*6/uL (ref 3.80–5.20)
RDW: 13 % (ref 11.5–14.5)
WBC: 7.7 x10 3/mm (ref 3.6–11.0)

## 2013-09-25 LAB — COMPREHENSIVE METABOLIC PANEL
AST: 17 U/L (ref 15–37)
Albumin: 4 g/dL (ref 3.4–5.0)
Alkaline Phosphatase: 80 U/L
Anion Gap: 9 (ref 7–16)
BILIRUBIN TOTAL: 0.3 mg/dL (ref 0.2–1.0)
BUN: 13 mg/dL (ref 7–18)
CO2: 28 mmol/L (ref 21–32)
CREATININE: 1.06 mg/dL (ref 0.60–1.30)
Calcium, Total: 9.3 mg/dL (ref 8.5–10.1)
Chloride: 101 mmol/L (ref 98–107)
EGFR (African American): >60
EGFR (Non-African Amer.): 52 — ABNORMAL LOW
Glucose: 127 mg/dL — ABNORMAL HIGH (ref 65–99)
Osmolality: 277 (ref 275–301)
Potassium: 4.2 mmol/L (ref 3.5–5.1)
SGPT (ALT): 25 U/L (ref 12–78)
SODIUM: 138 mmol/L (ref 136–145)
TOTAL PROTEIN: 7 g/dL (ref 6.4–8.2)

## 2013-09-28 ENCOUNTER — Telehealth: Payer: Self-pay | Admitting: Internal Medicine

## 2013-09-28 NOTE — Telephone Encounter (Signed)
Called and spoke with patient, she verbalized understanding

## 2013-09-28 NOTE — Telephone Encounter (Signed)
Please let pt know that I received blood sugars. They look fine. Follow up as scheduled.

## 2013-10-03 ENCOUNTER — Ambulatory Visit: Payer: Self-pay | Admitting: Oncology

## 2013-10-17 ENCOUNTER — Ambulatory Visit (INDEPENDENT_AMBULATORY_CARE_PROVIDER_SITE_OTHER): Payer: Medicare Other | Admitting: Internal Medicine

## 2013-10-17 ENCOUNTER — Encounter: Payer: Self-pay | Admitting: Internal Medicine

## 2013-10-17 VITALS — BP 140/64 | HR 78 | Temp 98.2°F | Ht 62.0 in | Wt 161.5 lb

## 2013-10-17 DIAGNOSIS — J309 Allergic rhinitis, unspecified: Secondary | ICD-10-CM

## 2013-10-17 MED ORDER — BECLOMETHASONE DIPROPIONATE 80 MCG/ACT NA AERS: 1.0000 | INHALATION_SPRAY | Freq: Every day | NASAL | Status: DC

## 2013-10-17 MED ORDER — LORATADINE 10 MG PO TABS: 10.0000 mg | ORAL_TABLET | Freq: Every day | ORAL | Status: DC

## 2013-10-17 NOTE — Progress Notes (Signed)
Pre visit review using our clinic review tool, if applicable. No additional management support is needed unless otherwise documented below in the visit note. 

## 2013-10-17 NOTE — Patient Instructions (Signed)
Allergic Rhinitis Allergic rhinitis is when the mucous membranes in the nose respond to allergens. Allergens are particles in the air that cause your body to have an allergic reaction. This causes you to release allergic antibodies. Through a chain of events, these eventually cause you to release histamine into the blood stream. Although meant to protect the body, it is this release of histamine that causes your discomfort, such as frequent sneezing, congestion, and an itchy, runny nose.  CAUSES  Seasonal allergic rhinitis (hay fever) is caused by pollen allergens that may come from grasses, trees, and weeds. Year-round allergic rhinitis (perennial allergic rhinitis) is caused by allergens such as house dust mites, pet dander, and mold spores.  SYMPTOMS   Nasal stuffiness (congestion).  Itchy, runny nose with sneezing and tearing of the eyes. DIAGNOSIS  Your health care provider can help you determine the allergen or allergens that trigger your symptoms. If you and your health care provider are unable to determine the allergen, skin or blood testing may be used. TREATMENT  Allergic Rhinitis does not have a cure, but it can be controlled by:  Medicines and allergy shots (immunotherapy).  Avoiding the allergen. Hay fever may often be treated with antihistamines in pill or nasal spray forms. Antihistamines block the effects of histamine. There are over-the-counter medicines that may help with nasal congestion and swelling around the eyes. Check with your health care provider before taking or giving this medicine.  If avoiding the allergen or the medicine prescribed do not work, there are many new medicines your health care provider can prescribe. Stronger medicine may be used if initial measures are ineffective. Desensitizing injections can be used if medicine and avoidance does not work. Desensitization is when a patient is given ongoing shots until the body becomes less sensitive to the allergen.  Make sure you follow up with your health care provider if problems continue. HOME CARE INSTRUCTIONS It is not possible to completely avoid allergens, but you can reduce your symptoms by taking steps to limit your exposure to them. It helps to know exactly what you are allergic to so that you can avoid your specific triggers. SEEK MEDICAL CARE IF:   You have a fever.  You develop a cough that does not stop easily (persistent).  You have shortness of breath.  You start wheezing.  Symptoms interfere with normal daily activities. Document Released: 03/16/2001 Document Revised: 04/11/2013 Document Reviewed: 02/26/2013 [employer] Patient Information 2014 [employer].  

## 2013-10-17 NOTE — Progress Notes (Signed)
   Subjective:    Patient ID: Kelsey Mason, female    DOB: Nov 28, 1940, 73 y.o.   MRN: [id number]  HPI 73YO female presents for acute visit.  Started feeling bad yesterday morning with scratchy throat, sneezing, coughing and itching in right eye. No fever, chills. Cough is non-productive.Took OTC cough medicine with no improvement.  Review of Systems  Constitutional: Negative for fever, chills and unexpected weight change.  HENT: Positive for congestion, postnasal drip, rhinorrhea and sneezing. Negative for ear discharge, ear pain, facial swelling, hearing loss, mouth sores, nosebleeds, sinus pressure, sore throat, tinnitus, trouble swallowing and voice change.   Eyes: Positive for redness and itching. Negative for pain, discharge and visual disturbance.  Respiratory: Positive for cough. Negative for chest tightness, shortness of breath, wheezing and stridor.   Cardiovascular: Negative for chest pain, palpitations and leg swelling.  Musculoskeletal: Negative for arthralgias, myalgias, neck pain and neck stiffness.  Skin: Negative for color change and rash.  Neurological: Negative for dizziness, weakness, light-headedness and headaches.  Hematological: Negative for adenopathy.       Objective:    BP 140/64  Pulse 78  Temp(Src) 98.2 F (36.8 C) (Oral)  Ht 5\' 2"  (1.575 m)  Wt 161 lb 8 oz (73.256 kg)  BMI 29.53 kg/m2  SpO2 97% Physical Exam  Constitutional: She is oriented to person, place, and time. She appears well-developed and well-nourished. No distress.  HENT:  Head: Normocephalic and atraumatic.  Right Ear: External ear normal.  Left Ear: External ear normal.  Nose: Mucosal edema and rhinorrhea present.  Mouth/Throat: Oropharynx is clear and moist. No oropharyngeal exudate.  Eyes: Pupils are equal, round, and reactive to light. Right eye exhibits no discharge. Left eye exhibits no discharge. Right conjunctiva is injected. Left conjunctiva is injected. No scleral  icterus.  Neck: Normal range of motion. Neck supple. No tracheal deviation present. No thyromegaly present.  Cardiovascular: Normal rate, regular rhythm, normal heart sounds and intact distal pulses.  Exam reveals no gallop and no friction rub.   No murmur heard. Pulmonary/Chest: Effort normal and breath sounds normal. No accessory muscle usage. Not tachypneic. No respiratory distress. She has no decreased breath sounds. She has no wheezes. She has no rhonchi. She has no rales. She exhibits no tenderness.  Musculoskeletal: Normal range of motion. She exhibits no edema and no tenderness.  Lymphadenopathy:    She has no cervical adenopathy.  Neurological: She is alert and oriented to person, place, and time. No cranial nerve deficit. She exhibits normal muscle tone. Coordination normal.  Skin: Skin is warm and dry. No rash noted. She is not diaphoretic. No erythema. No pallor.  Psychiatric: She has a normal mood and affect. Her behavior is normal. Judgment and thought content normal.          Assessment & Plan:   Problem List Items Addressed This Visit   Allergic rhinitis - Primary     Symptoms consistent with allergic rhinitis. Will start Claritin and Qnasl. Pt will call if any worsening symptoms, fever, or poor symptom control. Discussed allergen avoidance. Follow up prn.    Relevant Medications      loratadine (CLARITIN) tablet 10 mg      Beclomethasone Dipropionate (QNASL) 80 MCG/ACT AERS       Return if symptoms worsen or fail to improve.

## 2013-10-17 NOTE — Assessment & Plan Note (Signed)
Symptoms consistent with allergic rhinitis. Will start Claritin and Qnasl. Pt will call if any worsening symptoms, fever, or poor symptom control. Discussed allergen avoidance. Follow up prn.

## 2013-11-12 ENCOUNTER — Telehealth: Payer: Self-pay | Admitting: Internal Medicine

## 2013-11-12 NOTE — Telephone Encounter (Signed)
The patient is applying for disability and she wants to talk with the doctor .

## 2013-11-12 NOTE — Telephone Encounter (Signed)
Please schedule a 75min visit. She should be aware that we do not do evaluation for SSI disability, but I can fill out paperwork for FMLA through her work.

## 2013-12-20 DIAGNOSIS — I059 Rheumatic mitral valve disease, unspecified: Secondary | ICD-10-CM | POA: Insufficient documentation

## 2014-01-02 ENCOUNTER — Ambulatory Visit (INDEPENDENT_AMBULATORY_CARE_PROVIDER_SITE_OTHER): Payer: Medicare Other | Admitting: Internal Medicine

## 2014-01-02 ENCOUNTER — Encounter: Payer: Self-pay | Admitting: Internal Medicine

## 2014-01-02 VITALS — BP 128/70 | HR 82 | Temp 98.7°F | Ht 62.0 in | Wt 163.5 lb

## 2014-01-02 DIAGNOSIS — I10 Essential (primary) hypertension: Secondary | ICD-10-CM

## 2014-01-02 DIAGNOSIS — E119 Type 2 diabetes mellitus without complications: Secondary | ICD-10-CM

## 2014-01-02 DIAGNOSIS — R071 Chest pain on breathing: Secondary | ICD-10-CM

## 2014-01-02 DIAGNOSIS — R0789 Other chest pain: Secondary | ICD-10-CM

## 2014-01-02 DIAGNOSIS — M79601 Pain in right arm: Secondary | ICD-10-CM

## 2014-01-02 DIAGNOSIS — M79609 Pain in unspecified limb: Secondary | ICD-10-CM

## 2014-01-02 DIAGNOSIS — M79603 Pain in arm, unspecified: Secondary | ICD-10-CM | POA: Insufficient documentation

## 2014-01-02 DIAGNOSIS — R5383 Other fatigue: Secondary | ICD-10-CM

## 2014-01-02 DIAGNOSIS — R5381 Other malaise: Secondary | ICD-10-CM

## 2014-01-02 LAB — COMPREHENSIVE METABOLIC PANEL
ALT: 17 U/L (ref 0–35)
AST: 17 U/L (ref 0–37)
Albumin: 4.4 g/dL (ref 3.5–5.2)
Alkaline Phosphatase: 71 U/L (ref 39–117)
BILIRUBIN TOTAL: 0.5 mg/dL (ref 0.2–1.2)
BUN: 21 mg/dL (ref 6–23)
CO2: 28 mEq/L (ref 19–32)
CREATININE: 1.1 mg/dL (ref 0.4–1.2)
Calcium: 9.8 mg/dL (ref 8.4–10.5)
Chloride: 102 mEq/L (ref 96–112)
GFR: 52.32 mL/min — ABNORMAL LOW (ref >60.00)
Glucose, Bld: 190 mg/dL — ABNORMAL HIGH (ref 70–99)
POTASSIUM: 5.5 meq/L — AB (ref 3.5–5.1)
Sodium: 138 mEq/L (ref 135–145)
Total Protein: 6.5 g/dL (ref 6.0–8.3)

## 2014-01-02 LAB — CBC WITH DIFFERENTIAL/PLATELET
Basophils Absolute: 0 10*3/uL (ref 0.0–0.1)
Basophils Relative: 0.4 % (ref 0.0–3.0)
EOS PCT: 6.2 % — AB (ref 0.0–5.0)
Eosinophils Absolute: 0.6 10*3/uL (ref 0.0–0.7)
HEMATOCRIT: 34.7 % — AB (ref 36.0–46.0)
HEMOGLOBIN: 11.8 g/dL — AB (ref 12.0–15.0)
LYMPHS ABS: 1 10*3/uL (ref 0.7–4.0)
LYMPHS PCT: 10.9 % — AB (ref 12.0–46.0)
MCHC: 33.8 g/dL (ref 30.0–36.0)
MCV: 89.8 fl (ref 78.0–100.0)
MONOS PCT: 6.9 % (ref 3.0–12.0)
Monocytes Absolute: 0.7 10*3/uL (ref 0.1–1.0)
NEUTROS ABS: 7.3 10*3/uL (ref 1.4–7.7)
Neutrophils Relative %: 75.6 % (ref 43.0–77.0)
Platelets: 210 10*3/uL (ref 150.0–400.0)
RBC: 3.87 Mil/uL (ref 3.87–5.11)
RDW: 13.8 % (ref 11.5–15.5)
WBC: 9.6 10*3/uL (ref 4.0–10.5)

## 2014-01-02 LAB — MICROALBUMIN / CREATININE URINE RATIO
Creatinine,U: 199.9 mg/dL
Microalb Creat Ratio: 2.2 mg/g (ref 0.0–30.0)
Microalb, Ur: 4.4 mg/dL — ABNORMAL HIGH (ref 0.0–1.9)

## 2014-01-02 LAB — LIPID PANEL
CHOL/HDL RATIO: 3
CHOLESTEROL: 132 mg/dL (ref 0–200)
HDL: 47.8 mg/dL (ref >39.00)
LDL Cholesterol: 71 mg/dL (ref 0–99)
NonHDL: 84.2
TRIGLYCERIDES: 67 mg/dL (ref 0.0–149.0)
VLDL: 13.4 mg/dL (ref 0.0–40.0)

## 2014-01-02 LAB — HEMOGLOBIN A1C: HEMOGLOBIN A1C: 7.6 % — AB (ref 4.6–6.5)

## 2014-01-02 LAB — HM DIABETES FOOT EXAM: HM Diabetic Foot Exam: NORMAL

## 2014-01-02 LAB — TSH: TSH: 2.62 u[IU]/mL (ref 0.35–4.50)

## 2014-01-02 LAB — VITAMIN B12: VITAMIN B 12: 305 pg/mL (ref 211–911)

## 2014-01-02 NOTE — Assessment & Plan Note (Signed)
BP Readings from Last 3 Encounters:  01/02/14 128/70  10/17/13 140/64  09/19/13 130/72   BP well controlled on repeat check today. Will continue Amlodipine-Benazepril.

## 2014-01-02 NOTE — Assessment & Plan Note (Signed)
Symptoms of persistent fatigue after chemo and radiation completed in 09/2013. Will check CBC, CMP, TSH, B12 with labs today.

## 2014-01-02 NOTE — Patient Instructions (Signed)
Labs today.  CT of the chest to evaluate nodular area in the chest wall.  We will set up referral to sports medicine to evaluate right arm pain.  Follow up in 4 weeks.

## 2014-01-02 NOTE — Progress Notes (Signed)
Pre visit review using our clinic review tool, if applicable. No additional management support is needed unless otherwise documented below in the visit note. 

## 2014-01-02 NOTE — Assessment & Plan Note (Signed)
Chest wall pain at site of approx 3cm nodular area right lower chest wall and 1cm nodular area left lower chest wall. Will set up CT chest for further evaluation, given her h/o breast cancer.

## 2014-01-02 NOTE — Assessment & Plan Note (Signed)
Right posterior arm pain. No palpable abnormalities. Will set up sports medicine evaluation for possible [place].

## 2014-01-02 NOTE — Progress Notes (Signed)
Subjective:    Patient ID: Kelsey Mason, female    DOB: [birth year], 73 y.o.   MRN: [id number]  HPI 73YO female presents for follow up.  DM - BG typically near 140. Compliant with meds  Recent daily clear sinus drainage.  Concerned about "knots" on bilateral chest wall. Painful, like cramping. Also having some aching pain in right posterior upper arm.  Tried to go back to work. Worked for 2 days and had to quit. Received some long term disability money and plans to live on this for now.  Review of Systems  Constitutional: Positive for fatigue. Negative for fever, chills, appetite change and unexpected weight change.  HENT: Positive for congestion, postnasal drip and rhinorrhea. Negative for sinus pressure, trouble swallowing and voice change.   Eyes: Negative for visual disturbance.  Respiratory: Negative for cough and shortness of breath.   Cardiovascular: Positive for chest pain (chest wall pain). Negative for leg swelling.  Gastrointestinal: Negative for abdominal pain.  Musculoskeletal: Positive for myalgias. Negative for arthralgias.  Skin: Negative for color change and rash.  Hematological: Negative for adenopathy. Does not bruise/bleed easily.  Psychiatric/Behavioral: Negative for dysphoric mood. The patient is not nervous/anxious.        Objective:    BP 128/70  Pulse 82  Temp(Src) 98.7 F (37.1 C) (Oral)  Ht 5\' 2"  (1.575 m)  Wt 163 lb 8 oz (74.163 kg)  BMI 29.90 kg/m2  SpO2 97% Physical Exam  Constitutional: She is oriented to person, place, and time. She appears well-developed and well-nourished. No distress.  HENT:  Head: Normocephalic and atraumatic.  Right Ear: External ear normal.  Left Ear: External ear normal.  Nose: Nose normal.  Mouth/Throat: Oropharynx is clear and moist. No oropharyngeal exudate.  Eyes: Conjunctivae are normal. Pupils are equal, round, and reactive to light. Right eye exhibits no discharge. Left eye exhibits no discharge. No  scleral icterus.  Neck: Normal range of motion. Neck supple. No tracheal deviation present. No thyromegaly present.  Cardiovascular: Normal rate, regular rhythm, normal heart sounds and intact distal pulses.  Exam reveals no gallop and no friction rub.   No murmur heard. Pulmonary/Chest: Effort normal and breath sounds normal. No accessory muscle usage. Not tachypneic. No respiratory distress. She has no decreased breath sounds. She has no wheezes. She has no rhonchi. She has no rales. She exhibits no tenderness.    Musculoskeletal: Normal range of motion. She exhibits no edema.       Right upper arm: She exhibits tenderness. She exhibits no bony tenderness, no swelling and no deformity.       Arms: Lymphadenopathy:    She has no cervical adenopathy.  Neurological: She is alert and oriented to person, place, and time. No cranial nerve deficit. She exhibits normal muscle tone. Coordination normal.  Skin: Skin is warm and dry. No rash noted. She is not diaphoretic. No erythema. No pallor.  Psychiatric: She has a normal mood and affect. Her behavior is normal. Judgment and thought content normal.          Assessment & Plan:   Problem List Items Addressed This Visit     Unprioritized   Arm pain, posterior     Right posterior arm pain. No palpable abnormalities. Will set up sports medicine evaluation for possible [place].    Relevant Orders      Ambulatory referral to Sports Medicine   Chest wall pain     Chest wall pain at site of approx 3cm nodular  area right lower chest wall and 1cm nodular area left lower chest wall. Will set up CT chest for further evaluation, given her h/o breast cancer.    Relevant Orders      CT Chest Wo Contrast   Diabetes mellitus type 2, controlled - Primary     Will recheck A1c with labs today. Continue Metformin.    Relevant Orders      Comprehensive metabolic panel      Hemoglobin A1c      Lipid panel      Microalbumin / creatinine urine ratio    Hypertension      BP Readings from Last 3 Encounters:  01/02/14 128/70  10/17/13 140/64  09/19/13 130/72   BP well controlled on repeat check today. Will continue Amlodipine-Benazepril.    Other malaise and fatigue     Symptoms of persistent fatigue after chemo and radiation completed in 09/2013. Will check CBC, CMP, TSH, B12 with labs today.    Relevant Orders      CBC with Differential      TSH      B12       Return in about 4 weeks (around 01/30/2014) for Recheck.

## 2014-01-02 NOTE — Assessment & Plan Note (Signed)
Will recheck A1c with labs today. Continue Metformin.

## 2014-01-03 ENCOUNTER — Telehealth: Payer: Self-pay | Admitting: Internal Medicine

## 2014-01-03 ENCOUNTER — Other Ambulatory Visit (INDEPENDENT_AMBULATORY_CARE_PROVIDER_SITE_OTHER): Payer: Medicare Other

## 2014-01-03 ENCOUNTER — Ambulatory Visit: Payer: Self-pay | Admitting: Internal Medicine

## 2014-01-03 DIAGNOSIS — R5381 Other malaise: Secondary | ICD-10-CM

## 2014-01-03 DIAGNOSIS — R5383 Other fatigue: Principal | ICD-10-CM

## 2014-01-03 LAB — BASIC METABOLIC PANEL
BUN: 17 mg/dL (ref 6–23)
CO2: 27 meq/L (ref 19–32)
Calcium: 9.8 mg/dL (ref 8.4–10.5)
Chloride: 101 mEq/L (ref 96–112)
Creatinine, Ser: 1.1 mg/dL (ref 0.4–1.2)
GFR: 51.24 mL/min — ABNORMAL LOW (ref >60.00)
GLUCOSE: 212 mg/dL — AB (ref 70–99)
POTASSIUM: 4.9 meq/L (ref 3.5–5.1)
Sodium: 135 mEq/L (ref 135–145)

## 2014-01-03 NOTE — Telephone Encounter (Signed)
CT chest was normal with no nodules seen. The areas that are prominent and painful are likely fatty tissue overlying ribs. However, if pain persists, we may want to consider additional evaluation. She should also point these out to her oncologist on follow up.

## 2014-01-03 NOTE — Telephone Encounter (Signed)
Notified pt. 

## 2014-01-03 NOTE — Telephone Encounter (Signed)
Relevant patient education assigned to patient using Emmi. ° °

## 2014-01-07 ENCOUNTER — Ambulatory Visit: Payer: Self-pay | Admitting: Oncology

## 2014-01-07 LAB — CBC CANCER CENTER
Basophil #: 0.1 x10 3/mm (ref 0.0–0.1)
Basophil %: 0.5 %
EOS ABS: 0.7 x10 3/mm (ref 0.0–0.7)
Eosinophil %: 6.9 %
HCT: 35.6 % (ref 35.0–47.0)
HGB: 12.1 g/dL (ref 12.0–16.0)
Lymphocyte #: 1 x10 3/mm (ref 1.0–3.6)
Lymphocyte %: 9.3 %
MCH: 30.2 pg (ref 26.0–34.0)
MCHC: 34 g/dL (ref 32.0–36.0)
MCV: 89 fL (ref 80–100)
Monocyte #: 0.6 x10 3/mm (ref 0.2–0.9)
Monocyte %: 5.4 %
NEUTROS PCT: 77.9 %
Neutrophil #: 8 x10 3/mm — ABNORMAL HIGH (ref 1.4–6.5)
Platelet: 188 x10 3/mm (ref 150–440)
RBC: 4.01 10*6/uL (ref 3.80–5.20)
RDW: 13.7 % (ref 11.5–14.5)
WBC: 10.3 x10 3/mm (ref 3.6–11.0)

## 2014-01-07 LAB — COMPREHENSIVE METABOLIC PANEL
ALK PHOS: 87 U/L
ALT: 25 U/L (ref 12–78)
AST: 13 U/L — AB (ref 15–37)
Albumin: 3.9 g/dL (ref 3.4–5.0)
Anion Gap: 7 (ref 7–16)
BUN: 19 mg/dL — AB (ref 7–18)
Bilirubin,Total: 0.3 mg/dL (ref 0.2–1.0)
Calcium, Total: 10 mg/dL (ref 8.5–10.1)
Chloride: 98 mmol/L (ref 98–107)
Co2: 30 mmol/L (ref 21–32)
Creatinine: 1.24 mg/dL (ref 0.60–1.30)
EGFR (African American): 50 — ABNORMAL LOW
EGFR (Non-African Amer.): 43 — ABNORMAL LOW
Glucose: 268 mg/dL — ABNORMAL HIGH (ref 65–99)
OSMOLALITY: 282 (ref 275–301)
POTASSIUM: 4.7 mmol/L (ref 3.5–5.1)
SODIUM: 135 mmol/L — AB (ref 136–145)
TOTAL PROTEIN: 6.8 g/dL (ref 6.4–8.2)

## 2014-01-12 LAB — HM DIABETES EYE EXAM

## 2014-01-16 ENCOUNTER — Encounter: Payer: Self-pay | Admitting: Internal Medicine

## 2014-01-21 ENCOUNTER — Ambulatory Visit: Payer: Medicare Other | Admitting: Family Medicine

## 2014-01-25 ENCOUNTER — Encounter: Payer: Self-pay | Admitting: *Deleted

## 2014-01-25 NOTE — Progress Notes (Signed)
Chart reviewed for DM bundle. Last OV 01/02/14, appt sch 02/07/14 for followup BP, LDL, and a1c now within paramaters

## 2014-02-02 ENCOUNTER — Ambulatory Visit: Payer: Self-pay | Admitting: Oncology

## 2014-02-07 ENCOUNTER — Encounter: Payer: Self-pay | Admitting: Internal Medicine

## 2014-02-07 ENCOUNTER — Ambulatory Visit (INDEPENDENT_AMBULATORY_CARE_PROVIDER_SITE_OTHER): Payer: Medicare Other | Admitting: Internal Medicine

## 2014-02-07 VITALS — BP 122/62 | HR 98 | Temp 98.3°F | Ht 62.0 in | Wt 166.5 lb

## 2014-02-07 DIAGNOSIS — M79609 Pain in unspecified limb: Secondary | ICD-10-CM

## 2014-02-07 DIAGNOSIS — D649 Anemia, unspecified: Secondary | ICD-10-CM | POA: Insufficient documentation

## 2014-02-07 DIAGNOSIS — M79601 Pain in right arm: Secondary | ICD-10-CM

## 2014-02-07 DIAGNOSIS — E119 Type 2 diabetes mellitus without complications: Secondary | ICD-10-CM

## 2014-02-07 DIAGNOSIS — I10 Essential (primary) hypertension: Secondary | ICD-10-CM

## 2014-02-07 NOTE — Assessment & Plan Note (Signed)
Symptoms improved with home exercises. Will continue.

## 2014-02-07 NOTE — Assessment & Plan Note (Signed)
Mild anemia noted on labs with Hgb 11. Per pt, repeated by ONC. Will request records on this.

## 2014-02-07 NOTE — Assessment & Plan Note (Signed)
BP Readings from Last 3 Encounters:  02/07/14 122/62  01/02/14 128/70  10/17/13 140/64   BP well controlled on current medications. Will continue.

## 2014-02-07 NOTE — Progress Notes (Signed)
Pre visit review using our clinic review tool, if applicable. No additional management support is needed unless otherwise documented below in the visit note. 

## 2014-02-07 NOTE — Patient Instructions (Signed)
Continue current medications    Follow up 3 months

## 2014-02-07 NOTE — Progress Notes (Signed)
   Subjective:    Patient ID: Kelsey Mason, female    DOB: [birth year], 73 y.o.   MRN: [id number]  HPI 73YO female presents for follow up.  DM - BG this morning 258, however typically better controlled. Compliant with meds.  Breast cancer - scheduled to see Dr. Baruch Gouty next month.  Pain in right arm improved. Doing home PT.  Review of Systems  Constitutional: Negative for fever, chills, appetite change, fatigue and unexpected weight change.  Eyes: Negative for visual disturbance.  Respiratory: Negative for shortness of breath.   Cardiovascular: Negative for chest pain and leg swelling.  Gastrointestinal: Negative for abdominal pain.  Musculoskeletal: Negative for arthralgias and myalgias.  Skin: Negative for color change and rash.  Hematological: Negative for adenopathy. Does not bruise/bleed easily.  Psychiatric/Behavioral: Negative for dysphoric mood. The patient is not nervous/anxious.        Objective:    BP 122/62  Pulse 98  Temp(Src) 98.3 F (36.8 C) (Oral)  Ht 5\' 2"  (1.575 m)  Wt 166 lb 8 oz (75.524 kg)  BMI 30.45 kg/m2  SpO2 98% Physical Exam  Constitutional: She is oriented to person, place, and time. She appears well-developed and well-nourished. No distress.  HENT:  Head: Normocephalic and atraumatic.  Right Ear: External ear normal.  Left Ear: External ear normal.  Nose: Nose normal.  Mouth/Throat: Oropharynx is clear and moist. No oropharyngeal exudate.  Eyes: Conjunctivae are normal. Pupils are equal, round, and reactive to light. Right eye exhibits no discharge. Left eye exhibits no discharge. No scleral icterus.  Neck: Normal range of motion. Neck supple. No tracheal deviation present. No thyromegaly present.  Cardiovascular: Normal rate, regular rhythm, normal heart sounds and intact distal pulses.  Exam reveals no gallop and no friction rub.   No murmur heard. Pulmonary/Chest: Effort normal and breath sounds normal. No accessory muscle usage.  Not tachypneic. No respiratory distress. She has no decreased breath sounds. She has no wheezes. She has no rhonchi. She has no rales. She exhibits no tenderness.  Musculoskeletal: Normal range of motion. She exhibits no edema and no tenderness.  Lymphadenopathy:    She has no cervical adenopathy.  Neurological: She is alert and oriented to person, place, and time. No cranial nerve deficit. She exhibits normal muscle tone. Coordination normal.  Skin: Skin is warm and dry. No rash noted. She is not diaphoretic. No erythema. No pallor.  Psychiatric: She has a normal mood and affect. Her behavior is normal. Judgment and thought content normal.          Assessment & Plan:   Problem List Items Addressed This Visit     Unprioritized   Anemia     Mild anemia noted on labs with Hgb 11. Per pt, repeated by ONC. Will request records on this.    Arm pain, posterior     Symptoms improved with home exercises. Will continue.    Diabetes mellitus type 2, controlled - Primary      Lab Results  Component Value Date   HGBA1C 7.6* 01/02/2014   BG well controlled on current medications. Plan repeat A1c in 3 months. Eye exam scheduled by pt. Foot exam UTD.    Hypertension      BP Readings from Last 3 Encounters:  02/07/14 122/62  01/02/14 128/70  10/17/13 140/64   BP well controlled on current medications. Will continue.        Return in about 3 months (around 05/10/2014) for Recheck of Diabetes.

## 2014-02-07 NOTE — Assessment & Plan Note (Signed)
Lab Results  Component Value Date   HGBA1C 7.6* 01/02/2014   BG well controlled on current medications. Plan repeat A1c in 3 months. Eye exam scheduled by pt. Foot exam UTD.

## 2014-02-26 ENCOUNTER — Other Ambulatory Visit: Payer: Self-pay | Admitting: Internal Medicine

## 2014-03-06 ENCOUNTER — Ambulatory Visit: Payer: Self-pay | Admitting: Oncology

## 2014-03-07 ENCOUNTER — Encounter: Payer: Self-pay | Admitting: General Surgery

## 2014-03-15 LAB — HM MAMMOGRAPHY: HM MAMMO: NORMAL

## 2014-03-20 ENCOUNTER — Encounter: Payer: Self-pay | Admitting: General Surgery

## 2014-03-20 ENCOUNTER — Ambulatory Visit (INDEPENDENT_AMBULATORY_CARE_PROVIDER_SITE_OTHER): Payer: Medicare Other | Admitting: General Surgery

## 2014-03-20 VITALS — BP 144/68 | HR 86 | Resp 16 | Ht 62.5 in | Wt 166.0 lb

## 2014-03-20 DIAGNOSIS — C50312 Malignant neoplasm of lower-inner quadrant of left female breast: Secondary | ICD-10-CM

## 2014-03-20 DIAGNOSIS — C50411 Malignant neoplasm of upper-outer quadrant of right female breast: Secondary | ICD-10-CM

## 2014-03-20 DIAGNOSIS — C50419 Malignant neoplasm of upper-outer quadrant of unspecified female breast: Secondary | ICD-10-CM

## 2014-03-20 DIAGNOSIS — C50319 Malignant neoplasm of lower-inner quadrant of unspecified female breast: Secondary | ICD-10-CM

## 2014-03-20 NOTE — Patient Instructions (Signed)
Patient to return in 1 year with bilateral diagnostic mammogram. Continue self breast exams. Call office for any new breast issues or concerns.  

## 2014-03-20 NOTE — Progress Notes (Signed)
Patient ID: Kelsey Mason, female   DOB: [birth year], 73 y.o.   MRN: [id number]  Chief Complaint  Patient presents with  . Follow-up    6 month mammogram    HPI Kelsey Mason is a 73 y.o. female who presents for a breast cancer follow up. The most recent mammogram was done on 03/07/14. Patient does perform regular self breast checks and gets regular mammograms done. The patient denies any new problems at this time.    HPI  Past Medical History  Diagnosis Date  . Diabetes mellitus   . Hypertension   . Breast symptom 2014  . Cancer     Bilateral Breast, chemo Dr. Jeb Levering    Past Surgical History  Procedure Laterality Date  . Cholecystectomy  2007  . Abdominal hysterectomy  1976    for pelvic pain  . Tumor removal Right 1980    right foot  . Oophorectomy Right   . Breast surgery Bilateral 2014  . Portacath placement  2014    Family History  Problem Relation Age of Onset  . Alcohol abuse Mother   . Heart disease Mother   . Hypertension Mother   . Alcohol abuse Father   . Heart disease Father   . Hypertension Father   . Heart disease Brother     Social History History  Substance Use Topics  . Smoking status: Former Smoker -- 0.25 packs/day for 1 years    Types: Cigarettes  . Smokeless tobacco: Never Used  . Alcohol Use: No    Allergies  Allergen Reactions  . Advil [Ibuprofen] Other (See Comments)    Dizziness   . Aleve [Naproxen] Other (See Comments)    Dizziness  . Fluticasone-Salmeterol Other (See Comments)    dizziness  . Lipitor [Atorvastatin] Other (See Comments)    Leg pain, muscle ache  . Penicillins Hives    Current Outpatient Prescriptions  Medication Sig Dispense Refill  . amLODipine-benazepril (LOTREL) 5-20 MG per capsule Take 1 capsule by mouth daily.  90 capsule  2  . aspirin 81 MG tablet Take 81 mg by mouth daily.      . Beclomethasone Dipropionate (QNASL) 80 MCG/ACT AERS Place 1 spray into the nose daily.  8.7 g  3  .  Calcium Carb-Cholecalciferol (SM CALCIUM/VITAMIN D) 600-800 MG-UNIT TABS Take by mouth.      . diphenhydramine-acetaminophen (TYLENOL PM) 25-500 MG TABS Take 1 tablet by mouth at bedtime as needed.      [name] glimepiride (AMARYL) 2 MG tablet Take 1 tablet (2 mg total) by mouth 2 (two) times daily.  180 tablet  4  . letrozole (FEMARA) 2.5 MG tablet Take by mouth.      . metFORMIN (GLUCOPHAGE) 1000 MG tablet TAKE 1/2 TABLET (500 MG TOTAL) BY MOUTH 2 (TWO) TIMES DAILY.  90 tablet  2  . rosuvastatin (CRESTOR) 10 MG tablet Take 1 tablet (10 mg total) by mouth daily.  90 tablet  4   No current facility-administered medications for this visit.    Review of Systems Review of Systems  Constitutional: Negative.   Respiratory: Negative.   Cardiovascular: Negative.     Blood pressure 144/68, pulse 86, resp. rate 16, height 5' 2.5" (1.588 m), weight 166 lb (75.297 kg).  Physical Exam Physical Exam  Constitutional: She is oriented to person, place, and time. She appears well-developed and well-nourished.  Eyes: Conjunctivae are normal. No scleral icterus.  Neck: Neck supple. No thyromegaly present.  Cardiovascular: Normal rate and regular  rhythm.   No murmur heard. Pulmonary/Chest: Effort normal and breath sounds normal. Right breast exhibits no inverted nipple, no mass, no nipple discharge, no skin change and no tenderness. Left breast exhibits no inverted nipple, no mass, no nipple discharge, no skin change and no tenderness.  Abdominal: Soft. Normal appearance and bowel sounds are normal. There is no hepatosplenomegaly. There is no tenderness. No hernia.  Lymphadenopathy:    She has no cervical adenopathy.    She has no axillary adenopathy.  Neurological: She is alert and oriented to person, place, and time.  Skin: Skin is warm and dry.    Data Reviewed  Mammogram reviewed and stable.  Assessment    Stable exam. 18 months post treatment for bilateral breast cancer. - Lumpectomy, node  biopsy, chemotherapy and radiation. Pt is now on letrazole.    Plan  Patient to return in 1 year with bilateral diagnostic mammogram.        Junie Panning G 03/20/2014, 12:59 PM

## 2014-04-04 ENCOUNTER — Ambulatory Visit: Payer: Self-pay | Admitting: Oncology

## 2014-04-15 ENCOUNTER — Other Ambulatory Visit: Payer: Self-pay | Admitting: Internal Medicine

## 2014-05-06 ENCOUNTER — Encounter: Payer: Self-pay | Admitting: General Surgery

## 2014-05-15 ENCOUNTER — Ambulatory Visit (INDEPENDENT_AMBULATORY_CARE_PROVIDER_SITE_OTHER): Payer: Medicare Other | Admitting: *Deleted

## 2014-05-15 ENCOUNTER — Encounter: Payer: Self-pay | Admitting: Internal Medicine

## 2014-05-15 ENCOUNTER — Ambulatory Visit (INDEPENDENT_AMBULATORY_CARE_PROVIDER_SITE_OTHER): Payer: Medicare Other | Admitting: Internal Medicine

## 2014-05-15 VITALS — BP 169/93 | HR 85 | Temp 98.2°F | Ht 62.5 in | Wt 165.5 lb

## 2014-05-15 DIAGNOSIS — Z0000 Encounter for general adult medical examination without abnormal findings: Secondary | ICD-10-CM

## 2014-05-15 DIAGNOSIS — Z23 Encounter for immunization: Secondary | ICD-10-CM

## 2014-05-15 DIAGNOSIS — E119 Type 2 diabetes mellitus without complications: Secondary | ICD-10-CM

## 2014-05-15 DIAGNOSIS — I10 Essential (primary) hypertension: Secondary | ICD-10-CM

## 2014-05-15 DIAGNOSIS — C50411 Malignant neoplasm of upper-outer quadrant of right female breast: Secondary | ICD-10-CM

## 2014-05-15 LAB — CBC WITH DIFFERENTIAL/PLATELET
Basophils Absolute: 0.1 10*3/uL (ref 0.0–0.1)
Basophils Relative: 0.8 % (ref 0.0–3.0)
EOS PCT: 12.7 % — AB (ref 0.0–5.0)
Eosinophils Absolute: 1.1 10*3/uL — ABNORMAL HIGH (ref 0.0–0.7)
HCT: 36.5 % (ref 36.0–46.0)
Hemoglobin: 12.4 g/dL (ref 12.0–15.0)
LYMPHS ABS: 1.2 10*3/uL (ref 0.7–4.0)
Lymphocytes Relative: 13.5 % (ref 12.0–46.0)
MCHC: 33.8 g/dL (ref 30.0–36.0)
MCV: 87.9 fl (ref 78.0–100.0)
MONO ABS: 0.5 10*3/uL (ref 0.1–1.0)
Monocytes Relative: 5.2 % (ref 3.0–12.0)
NEUTROS ABS: 6 10*3/uL (ref 1.4–7.7)
Neutrophils Relative %: 67.8 % (ref 43.0–77.0)
Platelets: 200 10*3/uL (ref 150.0–400.0)
RBC: 4.16 Mil/uL (ref 3.87–5.11)
RDW: 14.1 % (ref 11.5–15.5)
WBC: 8.8 10*3/uL (ref 4.0–10.5)

## 2014-05-15 LAB — COMPREHENSIVE METABOLIC PANEL
ALK PHOS: 68 U/L (ref 39–117)
ALT: 16 U/L (ref 0–35)
AST: 16 U/L (ref 0–37)
Albumin: 3.6 g/dL (ref 3.5–5.2)
BUN: 19 mg/dL (ref 6–23)
CO2: 29 mEq/L (ref 19–32)
Calcium: 9.7 mg/dL (ref 8.4–10.5)
Chloride: 98 mEq/L (ref 96–112)
Creatinine, Ser: 1.2 mg/dL (ref 0.4–1.2)
GFR: 47.7 mL/min — AB (ref >60.00)
Glucose, Bld: 232 mg/dL — ABNORMAL HIGH (ref 70–99)
Potassium: 4.4 mEq/L (ref 3.5–5.1)
Sodium: 134 mEq/L — ABNORMAL LOW (ref 135–145)
Total Bilirubin: 0.6 mg/dL (ref 0.2–1.2)
Total Protein: 6.4 g/dL (ref 6.0–8.3)

## 2014-05-15 LAB — LIPID PANEL
CHOL/HDL RATIO: 4
Cholesterol: 154 mg/dL (ref 0–200)
HDL: 42.5 mg/dL (ref >39.00)
LDL Cholesterol: 87 mg/dL (ref 0–99)
NONHDL: 111.5
Triglycerides: 125 mg/dL (ref 0.0–149.0)
VLDL: 25 mg/dL (ref 0.0–40.0)

## 2014-05-15 LAB — MICROALBUMIN / CREATININE URINE RATIO
Creatinine,U: 192.9 mg/dL
Microalb Creat Ratio: 2.2 mg/g (ref 0.0–30.0)
Microalb, Ur: 4.3 mg/dL — ABNORMAL HIGH (ref 0.0–1.9)

## 2014-05-15 LAB — HEMOGLOBIN A1C: Hgb A1c MFr Bld: 8.4 % — ABNORMAL HIGH (ref 4.6–6.5)

## 2014-05-15 NOTE — Assessment & Plan Note (Signed)
Reviewed recent notes from her surgeon. Continue Letrozole.

## 2014-05-15 NOTE — Assessment & Plan Note (Signed)
BP Readings from Last 3 Encounters:  05/15/14 169/93  03/20/14 144/68  02/07/14 122/62   BP slightly elevated today, however pt did not take meds. Will have her recheck BP here in 4 weeks. Renal function with labs today.

## 2014-05-15 NOTE — Patient Instructions (Signed)

## 2014-05-15 NOTE — Assessment & Plan Note (Signed)
General medical exam normal except as noted. Pelvic exam deferred as pt s/p hysterectomy. Pneumonia vaccine declined. Flu vaccine UTD. Mammogram UTD and reviewed. Colonoscopy UTD. Encouraged healthy diet and exercise.

## 2014-05-15 NOTE — Assessment & Plan Note (Signed)
Will check A1c with labs today. Continue Glimepiride.

## 2014-05-15 NOTE — Progress Notes (Signed)
Subjective:    Patient ID: Kelsey Mason, female    DOB: 1941/06/09, 73 y.o.   MRN: [id number]  HPI The patient is here for annual Medicare wellness examination and management of other chronic and acute problems.   The risk factors are reflected in the social history.  The roster of all physicians providing medical care to patient - is listed in the Snapshot section of the chart.  Activities of daily living:  The patient is 100% independent in all ADLs: dressing, toileting, feeding as well as independent mobility. Lives alone in single home. No pets  Home safety : The patient has smoke detectors in the home. They wear seatbelts.  There are no firearms at home. There is no violence in the home.   There is no risks for hepatitis, STDs or HIV. There is no history of blood transfusion. They have no travel history to infectious disease endemic areas of the world.  The patient has not seen their dentist in the last six month. Delayed because of cancer treatment.([facility]) They have seen their eye doctor in the last year. [facility]) No issues with hearing. They have deferred audiologic testing in the last year.   They do not  have excessive sun exposure. Discussed the need for sun protection: hats, long sleeves and use of sunscreen if there is significant sun exposure.  Oncologist - Dr. Jeb Levering Radiation Oncologist - Dr. Baruch Gouty  Diet: the importance of a healthy diet is discussed. They do have a relatively healthy diet. Limited vegetables.  The benefits of regular aerobic exercise were discussed. Started exercise program at the hospital for breast cancer patients, uses machines, bikes.  Depression screen: there are no signs or vegative symptoms of depression- irritability, change in appetite, anhedonia, sadness/tearfullness.  Cognitive assessment: the patient manages all their financial and personal affairs and is actively engaged. They could relate day,date,year and  events.  The following portions of the patient's history were reviewed and updated as appropriate: allergies, current medications, past family history, past medical history,  past surgical history, past social history  and problem list.  Visual acuity was not assessed per patient preference since she has regular follow up with her ophthalmologist. Hearing and body mass index were assessed and reviewed.   During the course of the visit the patient was educated and counseled about appropriate screening and preventive services including : fall prevention , diabetes screening, nutrition counseling, colorectal cancer screening, and recommended immunizations.    Has not taken medications today.   Review of Systems  Constitutional: Negative for fever, chills, appetite change, fatigue and unexpected weight change.  Eyes: Negative for visual disturbance.  Respiratory: Negative for shortness of breath.   Cardiovascular: Negative for chest pain and leg swelling.  Gastrointestinal: Negative for nausea, vomiting, abdominal pain, diarrhea and constipation.  Musculoskeletal: Negative for myalgias and arthralgias.  Skin: Negative for color change and rash.  Hematological: Negative for adenopathy. Does not bruise/bleed easily.  Psychiatric/Behavioral: Negative for dysphoric mood. The patient is not nervous/anxious.        Objective:    BP 169/93 mmHg  Pulse 85  Temp(Src) 98.2 F (36.8 C) (Oral)  Ht 5' 2.5" (1.588 m)  Wt 165 lb 8 oz (75.07 kg)  BMI 29.77 kg/m2  SpO2 97% Physical Exam  Constitutional: She is oriented to person, place, and time. She appears well-developed and well-nourished. No distress.  HENT:  Head: Normocephalic and atraumatic.  Right Ear: External ear normal.  Left Ear: External  ear normal.  Nose: Nose normal.  Mouth/Throat: Oropharynx is clear and moist. No oropharyngeal exudate.  Eyes: Conjunctivae are normal. Pupils are equal, round, and reactive to light. Right eye  exhibits no discharge. Left eye exhibits no discharge. No scleral icterus.  Neck: Normal range of motion. Neck supple. No tracheal deviation present. No thyromegaly present.  Cardiovascular: Normal rate, regular rhythm, normal heart sounds and intact distal pulses.  Exam reveals no gallop and no friction rub.   No murmur heard. Pulmonary/Chest: Effort normal and breath sounds normal. No accessory muscle usage. No tachypnea. No respiratory distress. She has no decreased breath sounds. She has no wheezes. She has no rales. She exhibits no tenderness. Right breast exhibits no inverted nipple, no mass, no nipple discharge, no skin change and no tenderness. Left breast exhibits no inverted nipple, no mass, no nipple discharge, no skin change and no tenderness. Breasts are symmetrical.    Abdominal: Soft. Bowel sounds are normal. She exhibits no distension and no mass. There is no tenderness. There is no rebound and no guarding.  Musculoskeletal: Normal range of motion. She exhibits no edema or tenderness.  Lymphadenopathy:    She has no cervical adenopathy.  Neurological: She is alert and oriented to person, place, and time. No cranial nerve deficit. She exhibits normal muscle tone. Coordination normal.  Skin: Skin is warm and dry. No rash noted. She is not diaphoretic. No erythema. No pallor.  Psychiatric: She has a normal mood and affect. Her behavior is normal. Judgment and thought content normal.          Assessment & Plan:   Problem List Items Addressed This Visit      Unprioritized   Diabetes mellitus type 2, controlled    Will check A1c with labs today. Continue Glimepiride.    Relevant Orders      Comprehensive metabolic panel      Lipid panel      Hemoglobin A1c      Microalbumin / creatinine urine ratio   Hypertension    BP Readings from Last 3 Encounters:  05/15/14 169/93  03/20/14 144/68  02/07/14 122/62   BP slightly elevated today, however pt did not take meds. Will  have her recheck BP here in 4 weeks. Renal function with labs today.    Malignant neoplasm of upper-outer quadrant of female breast right, T1,N0,M0. ER/PR pos, Her 2 neg    Reviewed recent notes from her surgeon. Continue Letrozole.    Relevant Orders      CBC with Differential   Medicare annual wellness visit, subsequent - Primary    General medical exam normal except as noted. Pelvic exam deferred as pt s/p hysterectomy. Pneumonia vaccine declined. Flu vaccine UTD. Mammogram UTD and reviewed. Colonoscopy UTD. Encouraged healthy diet and exercise.        Return in about 4 weeks (around 06/12/2014) for Recheck of Blood Pressure.

## 2014-05-15 NOTE — Progress Notes (Signed)
Pre visit review using our clinic review tool, if applicable. No additional management support is needed unless otherwise documented below in the visit note. 

## 2014-06-05 ENCOUNTER — Other Ambulatory Visit: Payer: Self-pay | Admitting: Internal Medicine

## 2014-06-18 ENCOUNTER — Ambulatory Visit (INDEPENDENT_AMBULATORY_CARE_PROVIDER_SITE_OTHER): Payer: Medicare Other | Admitting: Internal Medicine

## 2014-06-18 ENCOUNTER — Encounter: Payer: Self-pay | Admitting: Internal Medicine

## 2014-06-18 VITALS — BP 142/72 | HR 87 | Temp 98.4°F | Ht 62.5 in | Wt 165.5 lb

## 2014-06-18 DIAGNOSIS — R110 Nausea: Secondary | ICD-10-CM

## 2014-06-18 DIAGNOSIS — F411 Generalized anxiety disorder: Secondary | ICD-10-CM

## 2014-06-18 DIAGNOSIS — I10 Essential (primary) hypertension: Secondary | ICD-10-CM

## 2014-06-18 DIAGNOSIS — E119 Type 2 diabetes mellitus without complications: Secondary | ICD-10-CM

## 2014-06-18 MED ORDER — SERTRALINE HCL 25 MG PO TABS: 25.0000 mg | ORAL_TABLET | Freq: Every day | ORAL | Status: DC

## 2014-06-18 MED ORDER — CLONAZEPAM 0.5 MG PO TABS: 0.5000 mg | ORAL_TABLET | Freq: Two times a day (BID) | ORAL | Status: DC | PRN

## 2014-06-18 NOTE — Assessment & Plan Note (Signed)
Lab Results  Component Value Date   HGBA1C 8.4* 05/15/2014   Recent A1c was elevated. BG improved, however still not at goal. Pt notes some dietary indiscretion esp with anxiety. Will continue Metformin and Glimepiride for now. Work on better control of anxiety and improved diet. Follow up 2 weeks.

## 2014-06-18 NOTE — Progress Notes (Signed)
Subjective:    Patient ID: Kelsey Mason, female    DOB: 05-02-1941, 73 y.o.   MRN: [id number]  HPI 73YO female presents for follow up.  DM - last A1c 8.4%. Most BG 150-170 with a few in the 200-300 range. Compliant with medication however feels nauseous after taking Glimepiride in the morning.  Feels nervous today with some nausea. "Anxious on inside and out." No vomiting. No diarrhea. These symptoms have been happening 2 times per week typically. Having trouble sleeping at night. Wakes frequently. Concerned about finances. This has been ongoing even prior to chemo.   Past medical, surgical, family and social history per today's encounter.  Review of Systems  Constitutional: Positive for fatigue. Negative for fever, chills, appetite change and unexpected weight change.  Eyes: Negative for visual disturbance.  Respiratory: Negative for shortness of breath.   Cardiovascular: Negative for chest pain and leg swelling.  Gastrointestinal: Positive for nausea. Negative for vomiting, abdominal pain, diarrhea, constipation, blood in stool, abdominal distention and anal bleeding.  Skin: Negative for color change and rash.  Hematological: Negative for adenopathy. Does not bruise/bleed easily.  Psychiatric/Behavioral: Positive for sleep disturbance. Negative for suicidal ideas and dysphoric mood. The patient is nervous/anxious.        Objective:    BP 142/72 mmHg  Pulse 87  Temp(Src) 98.4 F (36.9 C) (Oral)  Ht 5' 2.5" (1.588 m)  Wt 165 lb 8 oz (75.07 kg)  BMI 29.77 kg/m2  SpO2 99% Physical Exam  Constitutional: She is oriented to person, place, and time. She appears well-developed and well-nourished. No distress.  HENT:  Head: Normocephalic and atraumatic.  Right Ear: External ear normal.  Left Ear: External ear normal.  Nose: Nose normal.  Mouth/Throat: Oropharynx is clear and moist. No oropharyngeal exudate.  Eyes: Conjunctivae are normal. Pupils are equal, round, and  reactive to light. Right eye exhibits no discharge. Left eye exhibits no discharge. No scleral icterus.  Neck: Normal range of motion. Neck supple. No tracheal deviation present. No thyromegaly present.  Cardiovascular: Normal rate, regular rhythm, normal heart sounds and intact distal pulses.  Exam reveals no gallop and no friction rub.   No murmur heard. Pulmonary/Chest: Effort normal and breath sounds normal. No accessory muscle usage. No tachypnea. No respiratory distress. She has no decreased breath sounds. She has no wheezes. She has no rhonchi. She has no rales. She exhibits no tenderness.  Musculoskeletal: Normal range of motion. She exhibits no edema or tenderness.  Lymphadenopathy:    She has no cervical adenopathy.  Neurological: She is alert and oriented to person, place, and time. No cranial nerve deficit. She exhibits normal muscle tone. Coordination normal.  Skin: Skin is warm and dry. No rash noted. She is not diaphoretic. No erythema. No pallor.  Psychiatric: Her speech is normal and behavior is normal. Judgment and thought content normal. Her mood appears anxious. Cognition and memory are normal.          Assessment & Plan:   Problem List Items Addressed This Visit      Unprioritized   Anxiety state - Primary    Recent increased anxiety. Will add Sertraline at bedtime and prn Clonazepam. Discussed potential risks and benefits of these medications. Follow up 2 weeks and prn.    Relevant Medications      sertraline (ZOLOFT) tablet   Diabetes mellitus type 2, controlled    Lab Results  Component Value Date   HGBA1C 8.4* 05/15/2014   Recent A1c was  elevated. BG improved, however still not at goal. Pt notes some dietary indiscretion esp with anxiety. Will continue Metformin and Glimepiride for now. Work on better control of anxiety and improved diet. Follow up 2 weeks.    Hypertension    BP Readings from Last 3 Encounters:  06/18/14 142/72  05/15/14 169/93  03/20/14  144/68   BP generally well controlled. Recent renal function normal. Will continue Amlodipine-Benazepril.    Nausea without vomiting    Nausea likely related to anxiety. Starting Clonazepam and Sertraline to help control symptoms. Follow up recheck 2 weeks.        Return in about 2 weeks (around 07/02/2014).

## 2014-06-18 NOTE — Assessment & Plan Note (Signed)
Nausea likely related to anxiety. Starting Clonazepam and Sertraline to help control symptoms. Follow up recheck 2 weeks.

## 2014-06-18 NOTE — Assessment & Plan Note (Signed)
Recent increased anxiety. Will add Sertraline at bedtime and prn Clonazepam. Discussed potential risks and benefits of these medications. Follow up 2 weeks and prn.

## 2014-06-18 NOTE — Progress Notes (Signed)
Pre visit review using our clinic review tool, if applicable. No additional management support is needed unless otherwise documented below in the visit note. 

## 2014-06-18 NOTE — Assessment & Plan Note (Signed)
BP Readings from Last 3 Encounters:  06/18/14 142/72  05/15/14 169/93  03/20/14 144/68   BP generally well controlled. Recent renal function normal. Will continue Amlodipine-Benazepril.

## 2014-06-18 NOTE — Patient Instructions (Signed)
Start Sertraline 25mg  daily at bedtime.  Start Clonazepam 0.5mg  up to twice daily as needed for anxiety.  Follow up in 2 weeks or sooner as needed.

## 2014-07-18 ENCOUNTER — Ambulatory Visit (INDEPENDENT_AMBULATORY_CARE_PROVIDER_SITE_OTHER): Payer: Medicare Other | Admitting: Internal Medicine

## 2014-07-18 ENCOUNTER — Encounter: Payer: Self-pay | Admitting: Internal Medicine

## 2014-07-18 VITALS — BP 132/88 | HR 101 | Temp 98.2°F | Ht 62.5 in | Wt 165.5 lb

## 2014-07-18 DIAGNOSIS — F411 Generalized anxiety disorder: Secondary | ICD-10-CM

## 2014-07-18 DIAGNOSIS — I10 Essential (primary) hypertension: Secondary | ICD-10-CM

## 2014-07-18 DIAGNOSIS — B9789 Other viral agents as the cause of diseases classified elsewhere: Secondary | ICD-10-CM

## 2014-07-18 DIAGNOSIS — J069 Acute upper respiratory infection, unspecified: Secondary | ICD-10-CM

## 2014-07-18 DIAGNOSIS — E119 Type 2 diabetes mellitus without complications: Secondary | ICD-10-CM

## 2014-07-18 MED ORDER — SAXAGLIPTIN HCL 2.5 MG PO TABS: 2.5000 mg | ORAL_TABLET | Freq: Every day | ORAL | Status: DC

## 2014-07-18 NOTE — Assessment & Plan Note (Signed)
BG continue to be elevated. Will add Onglyza. Discussed potential risks of this medication. Follow up in 4 weeks and sooner as needed.

## 2014-07-18 NOTE — Patient Instructions (Addendum)
Start Onglyza 2.5mg  every morning with breakfast to help lower blood sugar.  Call if any sugars less than 70.  Call if cold symptoms including cough are not improving.  Follow up in 4 weeks.

## 2014-07-18 NOTE — Assessment & Plan Note (Signed)
Symptoms and exam consistent with URI. Encouraged rest, adequate fluids. Discussed adding codeine for cough, but she would like to hold off for now. Follow up if symptoms not improving.

## 2014-07-18 NOTE — Progress Notes (Signed)
Subjective:    Patient ID: Kelsey Mason, female    DOB: September 18, 1940, 74 y.o.   MRN: [id number]  HPI 74YO female presents for follow up.  Last visit 12/15 - Added Sertraline to help with anxiety and prn Clonazepam.  Seen by cardiology yesterday, BP 130/78. No changes made to meds.  Cough and congestion since Tuesday. No fever, chills. No chest pain or dyspnea. Not taking anything for this. No known sick contacts.  Anxiety - No change noted with addition of Sertraline. Stopped medication. Used Clonazepam twice but not recently. Anxiety has improved.  DM - BG have been mostly near 200. Compliant with glimepiride and Metformin.  Past medical, surgical, family and social history per today's encounter.  Review of Systems  Constitutional: Positive for fatigue. Negative for fever, chills, appetite change and unexpected weight change.  HENT: Positive for congestion, postnasal drip and rhinorrhea. Negative for sore throat and trouble swallowing.   Eyes: Negative for visual disturbance.  Respiratory: Positive for cough. Negative for shortness of breath and wheezing.   Cardiovascular: Negative for chest pain and leg swelling.  Gastrointestinal: Negative for nausea, vomiting, abdominal pain, diarrhea and constipation.  Skin: Negative for color change and rash.  Hematological: Negative for adenopathy. Does not bruise/bleed easily.  Psychiatric/Behavioral: Negative for dysphoric mood. The patient is not nervous/anxious.        Objective:    BP 132/88 mmHg  Pulse 101  Temp(Src) 98.2 F (36.8 C) (Oral)  Ht 5' 2.5" (1.588 m)  Wt 165 lb 8 oz (75.07 kg)  BMI 29.77 kg/m2  SpO2 99% Physical Exam  Constitutional: She is oriented to person, place, and time. She appears well-developed and well-nourished. No distress.  HENT:  Head: Normocephalic and atraumatic.  Right Ear: External ear normal.  Left Ear: External ear normal.  Nose: Mucosal edema present.  Mouth/Throat: Posterior  oropharyngeal erythema (mild) present. No oropharyngeal exudate.  Eyes: Conjunctivae are normal. Pupils are equal, round, and reactive to light. Right eye exhibits no discharge. Left eye exhibits no discharge. No scleral icterus.  Neck: Normal range of motion. Neck supple. No tracheal deviation present. No thyromegaly present.  Cardiovascular: Normal rate, regular rhythm, normal heart sounds and intact distal pulses.  Exam reveals no gallop and no friction rub.   No murmur heard. Pulmonary/Chest: Effort normal and breath sounds normal. No accessory muscle usage. No tachypnea. No respiratory distress. She has no decreased breath sounds. She has no wheezes. She has no rhonchi. She has no rales. She exhibits no tenderness.  Musculoskeletal: Normal range of motion. She exhibits no edema or tenderness.  Lymphadenopathy:    She has no cervical adenopathy.  Neurological: She is alert and oriented to person, place, and time. No cranial nerve deficit. She exhibits normal muscle tone. Coordination normal.  Skin: Skin is warm and dry. No rash noted. She is not diaphoretic. No erythema. No pallor.  Psychiatric: She has a normal mood and affect. Her behavior is normal. Judgment and thought content normal.          Assessment & Plan:   Problem List Items Addressed This Visit      Unprioritized   Anxiety state    Symptoms improved, despite stopping medication. Will continue prn Clonazepam.      Diabetes mellitus type 2, controlled    BG continue to be elevated. Will add Onglyza. Discussed potential risks of this medication. Follow up in 4 weeks and sooner as needed.      Relevant Medications  saxagliptin HCl (ONGLYZA) 2.5 MG TABS tablet   Hypertension - Primary    BP Readings from Last 3 Encounters:  07/18/14 132/88  06/18/14 142/72  05/15/14 169/93   BP well controlled (note initial read today was during coughing episode). Will continue current meds. Reviewed notes from cardiology.       Viral URI with cough    Symptoms and exam consistent with URI. Encouraged rest, adequate fluids. Discussed adding codeine for cough, but she would like to hold off for now. Follow up if symptoms not improving.          Return in about 4 weeks (around 08/15/2014).

## 2014-07-18 NOTE — Progress Notes (Signed)
Pre visit review using our clinic review tool, if applicable. No additional management support is needed unless otherwise documented below in the visit note. 

## 2014-07-18 NOTE — Assessment & Plan Note (Signed)
BP Readings from Last 3 Encounters:  07/18/14 132/88  06/18/14 142/72  05/15/14 169/93   BP well controlled (note initial read today was during coughing episode). Will continue current meds. Reviewed notes from cardiology.

## 2014-07-18 NOTE — Assessment & Plan Note (Signed)
Symptoms improved, despite stopping medication. Will continue prn Clonazepam.

## 2014-07-23 ENCOUNTER — Telehealth: Payer: Self-pay | Admitting: Internal Medicine

## 2014-07-23 ENCOUNTER — Ambulatory Visit (INDEPENDENT_AMBULATORY_CARE_PROVIDER_SITE_OTHER)
Admission: RE | Admit: 2014-07-23 | Discharge: 2014-07-23 | Disposition: A | Discharge status: Home / Self Care | Payer: Medicare Other | Source: Ambulatory Visit | Attending: Internal Medicine | Admitting: Internal Medicine

## 2014-07-23 ENCOUNTER — Other Ambulatory Visit: Payer: Self-pay | Admitting: Internal Medicine

## 2014-07-23 DIAGNOSIS — R059 Cough, unspecified: Secondary | ICD-10-CM

## 2014-07-23 DIAGNOSIS — R05 Cough: Secondary | ICD-10-CM

## 2014-07-23 NOTE — Telephone Encounter (Signed)
Advised pt to get Xray at [facility], we can see pt tomorrow at 11?

## 2014-07-23 NOTE — Telephone Encounter (Signed)
Patient saw you last week and said she is not feeling any better.  She has not been able to sleep in 3 days due to severe coughing.

## 2014-07-23 NOTE — Telephone Encounter (Signed)
That is fine 

## 2014-07-23 NOTE — Telephone Encounter (Signed)
OK. I will place an order for a chest xray today at [facility]. Then can we work her in tomorrow?

## 2014-07-24 ENCOUNTER — Encounter: Payer: Self-pay | Admitting: Internal Medicine

## 2014-07-24 ENCOUNTER — Ambulatory Visit (INDEPENDENT_AMBULATORY_CARE_PROVIDER_SITE_OTHER): Payer: Medicare Other | Admitting: Internal Medicine

## 2014-07-24 VITALS — BP 143/76 | HR 98 | Temp 98.4°F | Ht 62.5 in | Wt 165.2 lb

## 2014-07-24 DIAGNOSIS — R05 Cough: Secondary | ICD-10-CM | POA: Insufficient documentation

## 2014-07-24 DIAGNOSIS — R059 Cough, unspecified: Secondary | ICD-10-CM | POA: Insufficient documentation

## 2014-07-24 MED ORDER — HYDROCOD POLST-CHLORPHEN POLST 10-8 MG/5ML PO LQCR: 5.0000 mL | Freq: Two times a day (BID) | ORAL | Status: DC | PRN

## 2014-07-24 MED ORDER — AZITHROMYCIN 250 MG PO TABS
ORAL_TABLET | ORAL | Status: DC
Start: 2014-07-24 — End: 2014-08-15

## 2014-07-24 NOTE — Patient Instructions (Signed)
Start Azithromycin.  Start Tussionex up to twice daily as needed for cough. This medication will make you drowsy.  Follow up in 1 week.

## 2014-07-24 NOTE — Progress Notes (Signed)
   Subjective:    Patient ID: Kelsey Mason, female    DOB: 05/02/1941, 74 y.o.   MRN: [id number]  HPI  74YO female presents to follow up recent cough.  CXR yesterday was normal.  Continues to have cough that keeps her awake at night. Occasionally productive of yellow sputum. Taking OTC cough syrup. No chest pain, dyspnea, fever, chills.  Past medical, surgical, family and social history per today's encounter.  Review of Systems  Constitutional: Positive for fatigue. Negative for fever, chills and unexpected weight change.  HENT: Positive for sore throat. Negative for congestion, ear discharge, ear pain, facial swelling, hearing loss, mouth sores, nosebleeds, postnasal drip, rhinorrhea, sinus pressure, sneezing, tinnitus, trouble swallowing and voice change.   Eyes: Negative for pain, discharge, redness and visual disturbance.  Respiratory: Positive for cough. Negative for chest tightness, shortness of breath, wheezing and stridor.   Cardiovascular: Negative for chest pain, palpitations and leg swelling.  Musculoskeletal: Negative for myalgias, arthralgias, neck pain and neck stiffness.  Skin: Negative for color change and rash.  Neurological: Negative for dizziness, weakness, light-headedness and headaches.  Hematological: Negative for adenopathy.  Psychiatric/Behavioral: Positive for sleep disturbance.       Objective:    BP 143/76 mmHg  Pulse 98  Temp(Src) 98.4 F (36.9 C) (Oral)  Ht 5' 2.5" (1.588 m)  Wt 165 lb 4 oz (74.957 kg)  BMI 29.72 kg/m2  SpO2 98% Physical Exam  Constitutional: She is oriented to person, place, and time. She appears well-developed and well-nourished. No distress.  HENT:  Head: Normocephalic and atraumatic.  Right Ear: External ear normal.  Left Ear: External ear normal.  Nose: Nose normal.  Mouth/Throat: Oropharynx is clear and moist. No oropharyngeal exudate.  Eyes: Conjunctivae are normal. Pupils are equal, round, and reactive to  light. Right eye exhibits no discharge. Left eye exhibits no discharge. No scleral icterus.  Neck: Normal range of motion. Neck supple. No tracheal deviation present. No thyromegaly present.  Cardiovascular: Normal rate, regular rhythm, normal heart sounds and intact distal pulses.  Exam reveals no gallop and no friction rub.   No murmur heard. Pulmonary/Chest: Effort normal and breath sounds normal. No accessory muscle usage. No tachypnea. No respiratory distress. She has no decreased breath sounds. She has no wheezes. She has no rhonchi. She has no rales. She exhibits no tenderness.  Musculoskeletal: Normal range of motion. She exhibits no edema or tenderness.  Lymphadenopathy:    She has no cervical adenopathy.  Neurological: She is alert and oriented to person, place, and time. No cranial nerve deficit. She exhibits normal muscle tone. Coordination normal.  Skin: Skin is warm and dry. No rash noted. She is not diaphoretic. No erythema. No pallor.  Psychiatric: She has a normal mood and affect. Her behavior is normal. Judgment and thought content normal.          Assessment & Plan:   Problem List Items Addressed This Visit      Unprioritized   Cough - Primary    Symptoms are most consistent with either viral URI with cough or possible pertussis, given persistence of symptoms. Will add Azithromycin. Will start Tussionex prn for cough. Follow up next week and prn if symptoms are not improving. Note that CXR yesterday was normal.      Relevant Medications   chlorpheniramine-HYDROcodone (TUSSIONEX) suspension 8-10 mg/74mL   azithromycin (ZITHROMAX) tablet       Return in about 1 week (around 07/31/2014) for Recheck.

## 2014-07-24 NOTE — Assessment & Plan Note (Signed)
Symptoms are most consistent with either viral URI with cough or possible pertussis, given persistence of symptoms. Will add Azithromycin. Will start Tussionex prn for cough. Follow up next week and prn if symptoms are not improving. Note that CXR yesterday was normal.

## 2014-07-24 NOTE — Progress Notes (Signed)
Pre visit review using our clinic review tool, if applicable. No additional management support is needed unless otherwise documented below in the visit note. 

## 2014-08-10 ENCOUNTER — Other Ambulatory Visit: Payer: Self-pay | Admitting: Internal Medicine

## 2014-08-15 ENCOUNTER — Encounter: Payer: Self-pay | Admitting: Internal Medicine

## 2014-08-15 ENCOUNTER — Ambulatory Visit (INDEPENDENT_AMBULATORY_CARE_PROVIDER_SITE_OTHER): Payer: Medicare Other | Admitting: Internal Medicine

## 2014-08-15 ENCOUNTER — Other Ambulatory Visit: Payer: Self-pay | Admitting: *Deleted

## 2014-08-15 VITALS — BP 176/67 | HR 88 | Temp 98.0°F | Ht 62.5 in | Wt 163.2 lb

## 2014-08-15 DIAGNOSIS — R059 Cough, unspecified: Secondary | ICD-10-CM

## 2014-08-15 DIAGNOSIS — I10 Essential (primary) hypertension: Secondary | ICD-10-CM

## 2014-08-15 DIAGNOSIS — R05 Cough: Secondary | ICD-10-CM

## 2014-08-15 DIAGNOSIS — E119 Type 2 diabetes mellitus without complications: Secondary | ICD-10-CM

## 2014-08-15 LAB — COMPREHENSIVE METABOLIC PANEL
ALBUMIN: 4.3 g/dL (ref 3.5–5.2)
ALT: 18 U/L (ref 0–35)
AST: 13 U/L (ref 0–37)
Alkaline Phosphatase: 78 U/L (ref 39–117)
BUN: 19 mg/dL (ref 6–23)
CALCIUM: 9.3 mg/dL (ref 8.4–10.5)
CHLORIDE: 99 meq/L (ref 96–112)
CO2: 28 mEq/L (ref 19–32)
Creatinine, Ser: 1.06 mg/dL (ref 0.40–1.20)
GFR: 53.94 mL/min — ABNORMAL LOW (ref >60.00)
GLUCOSE: 258 mg/dL — AB (ref 70–99)
POTASSIUM: 4.6 meq/L (ref 3.5–5.1)
Sodium: 132 mEq/L — ABNORMAL LOW (ref 135–145)
Total Bilirubin: 0.5 mg/dL (ref 0.2–1.2)
Total Protein: 6.4 g/dL (ref 6.0–8.3)

## 2014-08-15 LAB — HEMOGLOBIN A1C: HEMOGLOBIN A1C: 8.6 % — AB (ref 4.6–6.5)

## 2014-08-15 LAB — LIPID PANEL
CHOL/HDL RATIO: 3
CHOLESTEROL: 126 mg/dL (ref 0–200)
HDL: 44.9 mg/dL (ref >39.00)
LDL Cholesterol: 61 mg/dL (ref 0–99)
NonHDL: 81.1
Triglycerides: 101 mg/dL (ref 0.0–149.0)
VLDL: 20.2 mg/dL (ref 0.0–40.0)

## 2014-08-15 LAB — MICROALBUMIN / CREATININE URINE RATIO
Creatinine,U: 144.7 mg/dL
MICROALB UR: 4 mg/dL — AB (ref 0.0–1.9)
Microalb Creat Ratio: 2.8 mg/g (ref 0.0–30.0)

## 2014-08-15 MED ORDER — SAXAGLIPTIN HCL 5 MG PO TABS: 5.0000 mg | ORAL_TABLET | Freq: Every day | ORAL | Status: DC

## 2014-08-15 NOTE — Patient Instructions (Signed)
Labs today.  Follow up in 3 months and sooner as needed. 

## 2014-08-15 NOTE — Assessment & Plan Note (Addendum)
Lab Results  Component Value Date   HGBA1C 8.4* 05/15/2014   BG slightly elevated, however pt has been out of Glimepiride. Discussed potential increase in Onglyza to 5mg  daily. Will recheck A1c with labs today.

## 2014-08-15 NOTE — Progress Notes (Signed)
Pre visit review using our clinic review tool, if applicable. No additional management support is needed unless otherwise documented below in the visit note. 

## 2014-08-15 NOTE — Assessment & Plan Note (Signed)
BP Readings from Last 3 Encounters:  08/15/14 176/67  07/24/14 143/76  07/18/14 132/88   BP elevated today, however did not take BP meds. Will have BP rechecked at [facility]. Continue current meds. Renal function with labs.

## 2014-08-15 NOTE — Assessment & Plan Note (Signed)
Cough has improved. Exam is normal. Suspect pertussis. S/p treatment with Azithromycin. Will continue to monitor for any recurrent symptoms.

## 2014-08-15 NOTE — Progress Notes (Signed)
Subjective:    Patient ID: Kelsey Mason, female    DOB: 05/28/1941, 74 y.o.   MRN: [id number]  HPI  74YO female presents for follow up.  Last seen 1/20 for cough - Started on Azithromycin.  Cough has improved. No dyspnea. No fever.  DM - BG mostly 150-200. Taking Onglyza and Metformin. Ran out of Glimepiride.  HTN - BP has been well controlled when checked at [facility]. Did not take BP today. No chest pain, palpitations.  Past medical, surgical, family and social history per today's encounter.  Review of Systems  Constitutional: Negative for fever, chills, appetite change, fatigue and unexpected weight change.  Eyes: Negative for visual disturbance.  Respiratory: Positive for cough. Negative for shortness of breath and wheezing.   Cardiovascular: Negative for chest pain and leg swelling.  Gastrointestinal: Negative for nausea, vomiting, abdominal pain, diarrhea and constipation.  Musculoskeletal: Negative for myalgias and arthralgias.  Skin: Negative for color change and rash.  Hematological: Negative for adenopathy. Does not bruise/bleed easily.  Psychiatric/Behavioral: Negative for sleep disturbance and dysphoric mood. The patient is not nervous/anxious.        Objective:    BP 176/67 mmHg  Pulse 88  Temp(Src) 98 F (36.7 C) (Oral)  Ht 5' 2.5" (1.588 m)  Wt 163 lb 4 oz (74.05 kg)  BMI 29.36 kg/m2  SpO2 100% Physical Exam  Constitutional: She is oriented to person, place, and time. She appears well-developed and well-nourished. No distress.  HENT:  Head: Normocephalic and atraumatic.  Right Ear: External ear normal.  Left Ear: External ear normal.  Nose: Nose normal.  Mouth/Throat: Oropharynx is clear and moist. No oropharyngeal exudate.  Eyes: Conjunctivae are normal. Pupils are equal, round, and reactive to light. Right eye exhibits no discharge. Left eye exhibits no discharge. No scleral icterus.  Neck: Normal range of motion. Neck supple. No  tracheal deviation present. No thyromegaly present.  Cardiovascular: Normal rate, regular rhythm, normal heart sounds and intact distal pulses.  Exam reveals no gallop and no friction rub.   No murmur heard. Pulmonary/Chest: Effort normal and breath sounds normal. No respiratory distress. She has no wheezes. She has no rales. She exhibits no tenderness.  Musculoskeletal: Normal range of motion. She exhibits no edema or tenderness.  Lymphadenopathy:    She has no cervical adenopathy.  Neurological: She is alert and oriented to person, place, and time. No cranial nerve deficit. She exhibits normal muscle tone. Coordination normal.  Skin: Skin is warm and dry. No rash noted. She is not diaphoretic. No erythema. No pallor.  Psychiatric: She has a normal mood and affect. Her behavior is normal. Judgment and thought content normal.          Assessment & Plan:   Problem List Items Addressed This Visit      Unprioritized   Cough    Cough has improved. Exam is normal. Suspect pertussis. S/p treatment with Azithromycin. Will continue to monitor for any recurrent symptoms.      Diabetes mellitus type 2, controlled - Primary    Lab Results  Component Value Date   HGBA1C 8.4* 05/15/2014   BG slightly elevated, however pt has been out of Glimepiride. Discussed potential increase in Onglyza to 5mg  daily. Will recheck A1c with labs today.       Relevant Orders   Comprehensive metabolic panel   Hemoglobin A1c   Lipid panel   Microalbumin / creatinine urine ratio   Hypertension    BP Readings from  Last 3 Encounters:  08/15/14 176/67  07/24/14 143/76  07/18/14 132/88   BP elevated today, however did not take BP meds. Will have BP rechecked at [facility]. Continue current meds. Renal function with labs.          Return in about 3 months (around 11/13/2014) for Recheck of Diabetes.

## 2014-08-16 ENCOUNTER — Other Ambulatory Visit: Payer: Self-pay | Admitting: Internal Medicine

## 2014-09-04 ENCOUNTER — Ambulatory Visit
Admit: 2014-09-04 | Disposition: A | Discharge status: Home / Self Care | Payer: Self-pay | Attending: Oncology | Admitting: Oncology

## 2014-09-30 ENCOUNTER — Telehealth: Payer: Self-pay

## 2014-09-30 MED ORDER — BLOOD GLUCOSE MONITOR KIT: PACK | Status: AC

## 2014-09-30 NOTE — Telephone Encounter (Signed)
Patient and pharmacy called to notify me that contour meter is not covered by insurance. Insurance will cover one touch or accu-chek. New Rx for one touch meter, lancets and strips have been sent to the pharmacy.

## 2014-10-04 ENCOUNTER — Ambulatory Visit
Admit: 2014-10-04 | Disposition: A | Discharge status: Home / Self Care | Payer: Self-pay | Attending: Oncology | Admitting: Oncology

## 2014-10-25 NOTE — Consult Note (Signed)
Reason for Visit: This 74 year old Female patient presents to the clinic for initial evaluation of  bilateral breast cancer .   Referred by Dr. Jamal Collin.  Diagnosis:  Chief Complaint/Diagnosis   74 year old female with bilateral breast cancerbreast stage I (T1 A. N0 M0)breast T1 C. N0 M0ER/PR positive HER-2/neu negative Oncotype DX pending  Pathology Report pathology report reviewed   Referral Report clinical notes reviewed   Planned Treatment Regimen bilateral whole breast radiation and possible chemotherapy   HPI   patient is a 74 year old female who presents with her first mammogram in March of 2014 showing a left breast nodule that had increased in size. Mother is known breast cancer status post double mastectomy in 41s. She was noted to have bilateral abnormalities on mammogram and underwent needle localization with right breast showing ductal carcinoma in situ. Left breast showed invasive mammary carcinoma. There was question about a third area in the right breast and she will underwent MRI scan which showed no other evidence of disease in either breast. She underwent bilateral wide local excisions and sentinel node biopsies. left breast had a focus approximately 0.3 CM of invasive mammary carcinoma. Tumor was ER/PR positive.after wide local excision there was 0.5 mm of residual invasive carcinoma in the right breast all margins clear. Margins were clear. Left breast had an area of of 1.8 cm of invasive mammary carcinoma overall grade 3 with margins clear but close at 1.5 mm. All sentinel lymph nodes examined were negative. Tumor again was ER positive PR positive. Patient has been seen by medical oncology and is undergoing Oncotype DX. She is tolerated her surgery well and is healing nicely. I been asked to evaluate the patient for consideration of radiation therapy while Oncotype DX is pending.  Past Hx:    Hypercholesterolemia:    HTN:    Diabetes:    Hysterectomy - Partial:     Lumpectomy:   Past, Family and Social History:  Past Medical History positive   Cardiovascular hyperlipidemia; hypertension   Endocrine diabetes mellitus   Past Surgical History total hysterectomy, lumpectomies   Family History positive   Family History Comments mother with bilateral mastectomies, mother with hypertension and coronary vascular heart disease as well as father.   Social History positive   Social History Comments Oxy 10-pack-year smoking history has quit smoking. No EtOH abuse history   Additional Past Medical and Surgical History accompanied by nurse navigator today   Allergies:   PCN: Hives  Lipitor: Muscles aches  Aleve: Other  Advil: Other  Lactose: Other  Home Meds:  Home Medications: Medication Instructions Status  glimepiride 2 mg oral tablet 1 tab(s) orally 2 times a day Active  Crestor 10 mg oral tablet 1 tab(s) orally once a day  with evening meal Active  amLODIPine-benazepril 5 mg-20 mg oral capsule 1 tab(s) orally 2 times a day Active  metFORMIN 1000 mg oral tablet 0.5 tab(s) orally 2 times a day Active  Tylenol 650 mg oral tablet, extended release 2 tab(s) orally once daily in am  Active  Tylenol 500 mg oral tablet 2 tab(s) orally at hs Active  Aspir 81 81 mg oral tablet 1 tab(s) orally once a day Active  Vitamin B-12  2500 mg orally  Active  traMADol 50 mg oral tablet  orally every 6 hours, As Needed - for Pain Active   Review of Systems:  General negative   Performance Status (ECOG) 0   Skin negative   Breast see HPI  Ophthalmologic negative   ENMT negative   Respiratory and Thorax negative   Cardiovascular negative   Gastrointestinal negative   Genitourinary negative   Musculoskeletal negative   Neurological negative   Psychiatric negative   Hematology/Lymphatics negative   Endocrine negative   Allergic/Immunologic negative   Review of Systems   according to the nurse's notesPatient denies any weight loss,  fatigue, weakness, fever, chills or night sweats. Patient denies any loss of vision, blurred vision. Patient denies any ringing  of the ears or hearing loss. No irregular heartbeat. Patient denies heart murmur or history of fainting. Patient denies any chest pain or pain radiating to her upper extremities. Patient denies any shortness of breath, difficulty breathing at night, cough or hemoptysis. Patient denies any swelling in the lower legs. Patient denies any nausea vomiting, vomiting of blood, or coffee ground material in the vomitus. Patient denies any stomach pain. Patient states has had normal bowel movements no significant constipation or diarrhea. Patient denies any dysuria, hematuria or significant nocturia. Patient denies any problems walking, swelling in the joints or loss of balance. Patient denies any skin changes, loss of hair or loss of weight. Patient denies any excessive worrying or anxiety or significant depression. Patient denies any problems with insomnia. Patient denies excessive thirst, polyuria, polydipsia. Patient denies any swollen glands, patient denies easy bruising or easy bleeding. Patient denies any recent infections, allergies or URI. Patient "s visual fields have not changed significantly in recent time.  Nursing Notes:  Nursing Vital Signs and Chemo Nursing Nursing Notes: *CC Vital Signs Flowsheet:   02-Jun-14 13:53  Temp Temperature 98.8  Pulse Pulse 99  Respirations Respirations 18  SBP SBP 157  DBP DBP 81  Pain Scale (0-10)  0  Current Weight (kg) (kg) 80.5  Height (cm) centimeters 157  BSA (m2) 1.8   Physical Exam:  General/Skin/HEENT:  General normal   Skin normal   Eyes normal   ENMT normal   Head and Neck normal   Additional PE well-developed well-nourished female in NAD. She status post bilateral wide local excisions of the breast. Both her healing well. Sentinel node incision sites are also healing well. No dominant mass or nodularity is noted in  either breast into position examined. No axillary or supraclavicular adenopathy is appreciated. Lungs are clear to A&P cardiac examination shows regular rate and rhythm.   Breasts/Resp/CV/GI/GU:  Respiratory and Thorax normal   Cardiovascular normal   Gastrointestinal normal   Genitourinary normal   MS/Neuro/Psych/Lymph:  Musculoskeletal normal   Neurological normal   Lymphatics normal   Relevent Results:   Relevant Scans and Labs mammograms are reviewed   Assessment and Plan: Impression:   bilateral breast cancers status post wide local excision and sentinel node biopsies with clear margins in 74 year old female. Both stage IA lesions. Patient is pending Oncotype DX. Plan:   at this time I discussed the case personally with Dr. Oliva Bustard. Based on Oncotype DX will decide whether patient needs systemic chemotherapy and if so would wait for radiation to after completion of those treatments. Otherwise would like to go ahead with bilateral breast radiation. Would treat both breasts to 5000 cGy boosting or scar is another 1600 cGy using electron beam. Risks and benefits of treatment including breast irritation skin changes fatigue alteration of blood counts were all explained in detail to the patient. I have set her up for CT simulation about 2 weeks' time we'll have the Oncotype DX back. We could offer the patient partial breast  radiation on one side although I would believe would be extremely difficult to have treated the patient in both breasts with accelerated partial breast irradiation and based on her family history bilateral breast involvement do favor whole breast radiation therapy bilaterally. All this was explained in detail to the patient. Dr. Oliva Bustard concurs with my recommendations.  I would like to take this opportunity to thank you for allowing me to continue to participate in this patient's care.  CC Referral:  cc: Dr. Jamal Collin ., Dr. Anderson Malta walker   Electronic  Signatures: Baruch Gouty, Roda Shutters (MD)  (Signed 03-Jun-14 12:01)  Authored: HPI, Diagnosis, Past Hx, PFSH, Allergies, Home Meds, ROS, Nursing Notes, Physical Exam, Relevent Results, Encounter Assessment and Plan, CC Referring Physician   Last Updated: 03-Jun-14 12:01 by Armstead Peaks (MD)

## 2014-10-25 NOTE — Op Note (Signed)
PATIENT NAME:  Kelsey Mason, Kelsey Mason MR#:  [id number] DATE OF BIRTH:  [birth year]  DATE OF PROCEDURE:  01/01/2013  PREOPERATIVE DIAGNOSIS: Carcinoma of breast bilateral.   POSTOPERATIVE DIAGNOSIS: Carcinoma of breast bilateral.  OPERATION: Insertion of venous access port with ultrasound and fluoroscopic guidance.   SURGEON: S.G. Jamal Collin, M.D.   ANESTHESIA: Monitored care and local anesthetic containing 0.5% Marcaine and 1% Xylocaine.   COMPLICATIONS: None.   ESTIMATED BLOOD LOSS: Minimal.   PROCEDURE: The patient was placed in the supine position on the operating table and then placed in a slight Trendelenburg position with adequate monitoring. The left upper chest and neck area were prepped and draped out as a sterile field. After time out was performed, an ultrasound probe was brought up to the field with a sterile cover. The subclavian vein was adequately visualized beneath the lateral end of the clavicle on the left and a local anesthetic was instilled and a small 1 cm skin incision was made. Through this, the needle was positioned going into the subclavian vein. Initially, there is not a good withdrawal of blood with the passage of the catheter. This was repeated and this time a guidewire was positioned properly and with Seldinger technique the catheter was positioned going towards the superior vena cava. The skin level marking was at 22 to 23 cm. Following this, a subcutaneous port was marked over the second costal cartilage area. This area was infiltrated with local anesthetic. A skin incision was made and a subcutaneous pocket made with cautery. The catheter was tunneled through to this site, cut to approximate length and then fixed to a prefilled port. The port was placed in the pocket and anchored to the underlying fascia with 3 stitches of 2-0 Prolene and was then flushed through with heparinized saline. Fluoroscopy was repeated once again to ensure that the catheter was still in position in  the superior vena cava. The subcutaneous tissue was closed with 3-0 Vicryl. The skin with subcuticular 4-0 Vicryl covered with Dermabond. The procedure was well tolerated. The patient subsequently was extubated and returned to the recovery room in stable condition.  ____________________________ S.Robinette Haines, MD sgs:aw D: 01/01/2013 09:39:47 ET T: 01/01/2013 10:03:27 ET JOB#: [id number]  cc: Synthia Innocent. Jamal Collin, MD, <Dictator> [facility] Robinette Haines MD ELECTRONICALLY SIGNED 01/01/2013 21:05

## 2014-10-25 NOTE — Consult Note (Signed)
History of Present Illness:  Reason for Consult 45.  74 year old lady presented with bilateral carcinoma of breast A. carcinoma of breast (right side) status post lumpectomy and sentinel lymph node biopsy Diagnosis on Nov 17, 2012 Stage IA. T1, N0, M0 tumor estrogen receptor positive progesterone receptor positive HER-2 receptor negative b.  Left breast invasive mammary carcinoma 18 mm mass.  Sentinel lymph nodes are negative T1 C. N0 M0 tumor high-grade estrogen receptor positive progesterone receptor +ve.  HER-2/neu receptor negative by FISH   HPI    HPI   patient is a 74 year old female who presents with her first mammogram in March of 2014 showing a left breast nodule that had increased in size. Mother is known breast cancer status post double mastectomy in 73s. She was noted to have bilateral abnormalities on mammogram and underwent needle localization with right breast showing ductal carcinoma in situ. Left breast showed invasive mammary carcinoma. There was question about a third area in the right breast and she will underwent MRI scan which showed no other evidence of disease in either breast. She underwent bilateral wide local excisions and sentinel node biopsies. left breast had a focus approximately 0.3 CM of invasive mammary carcinoma. Tumor was ER/PR positive.after wide local excision there was 0.5 mm of residual invasive carcinoma in the right breast all margins clear. Margins were clear. Left breast had an area of of 1.8 cm of invasive mammary carcinoma overall grade 3 with margins clear but close at 1.5 mm. All sentinel lymph nodes examined were negative. Tumor again was ER positive PR positive..  Patient is now also being evaluated by the radiation oncologist  Maries:  Comments Patient's mother had breast cancer in her 21s This history of brain tumor and leukemia   Comments Patient does not smoke.  Does not drink.   Additional Past Medical and Surgical History As mentioned  above   Review of Systems:  General denies complaints   Performance Status (ECOG) 0   HEENT no complaints   Lungs no complaints   Cardiac no complaints   GI no complaints   GU no complaints   Musculoskeletal no complaints   Extremities no complaints   Skin no complaints   Neuro no complaints   Endocrine no complaints   Psych no complaints   NURSING NOTES: *CC Vital Signs Flowsheet:   02-Jun-14 13:53   Temp Temperature: 98.8   Pulse Pulse: 99   Respirations Respirations: 18   SBP SBP: 157   DBP DBP: 81   Pain Scale (0-10): 0   Current Weight (kg) (kg): 80.5   Height (cm) centimeters: 157   BSA (m2): 1.8   Physical Exam:  General Patient is alert oriented not in any acute distress   HEENT: normal   Lungs: clear   Cardiac: regular rate, rhythm   Abdomen: soft  nontender  positive bowel sounds   Skin: intact   Musculoskeletal: No abnormal indicating   Extremities: No edema, rash or cyanosis   Neuro: AAOx3  cranial nerves intact  disoriented   Psych: normal appearance   Physical Exam LYMPHATICS:   No cervical, axillary, or inguinal lymphadenopathy Examination of breast: Status post bilateral lumpectomy and axillary lymph node evaluation Wound is healing well.     Hypercholesterolemia:    HTN:    Diabetes:    Hysterectomy - Partial:    Lumpectomy:    PCN: Hives  Lipitor: Muscles aches  Aleve: Other  Advil: Other  Lactose: Other    glimepiride  2 mg oral tablet: 1 tab(s) orally 2 times a day, Status: Active, Quantity: 0, Refills: None   Crestor 10 mg oral tablet: 1 tab(s) orally once a day  with evening meal, Status: Active, Quantity: 0, Refills: None   amLODIPine-benazepril 5 mg-20 mg oral capsule: 1 tab(s) orally 2 times a day, Status: Active, Quantity: 0, Refills: None   metFORMIN 1000 mg oral tablet: 0.5 tab(s) orally 2 times a day, Status: Active, Quantity: 0, Refills: None   Tylenol 650 mg oral tablet, extended  release: 2 tab(s) orally once daily in am , Status: Active, Quantity: 0, Refills: None   Tylenol 500 mg oral tablet: 2 tab(s) orally at hs, Status: Active, Quantity: 0, Refills: None   Aspir 81 81 mg oral tablet: 1 tab(s) orally once a day, Status: Active, Quantity: 0, Refills: None   Vitamin B-12:  2500 mg orally , Status: Active, Quantity: 0, Refills: None   traMADol 50 mg oral tablet:  orally every 6 hours, As Needed - for Pain, Status: Active, Quantity: 0, Refills: None  Laboratory Results: Hepatic:  12-May-14 15:12   Bilirubin, Total 0.3  Alkaline Phosphatase 86  SGPT (ALT) 26  SGOT (AST) 21  Total Protein, Serum 6.7  Albumin, Serum 4.1  Pathology:  16-May-14 00:12   Pathology Report ========== TEST NAME ==========  ========= RESULTS =========  = REFERENCE RANGE =  PATHOLOGY REPORT  Pathology Report .                               [   Final Report         ]                   Material submitted:         [name] PART A: RIGHT SENTINEL LYMPH NODE #1 PART B: RIGHT SENTINEL LYMPH NODE #2 PART C: RIGHT BREAST LUMPECTOMY PART D: SENTINEL NODE LEFT BREAST PART E: LEFT BREAST LUMPECTOMY AT 9:00 .                               [   Final Report  ]                   Pre-operative diagnosis:                                        . RIGHT AND LEFT BREAST CANCER WIDE EXCISION BILATERAL BREAST WITH SENTINEL NODE BIOPSY .                               [   Final Report         ]    Post-operative diagnosis:                                       . SAME .                               [   Final Report         ]  Frozen section diagnosis:                                       . FSA1, SENTINEL LYMPH NODE (BLUE DYE): - NEGATIVE FOR MACROMETASTASIS. - PREDOMINANTLY FAT. - CALLED TO DR. [facility] AT 12:07 P.M. ON 11-17-12. . FSB1, SENTINEL LYMPH NODE: - NEGATIVE FOR MACROMETASTASIS. - PREDOMINANTLY FAT. - CALLED TO [facility]. [facility] AT 12:18 P.M. ON 11-17-12. [name] IOCC1, RIGHT BREAST  LUMPECTOMY: - BIOPSY CAVITY, 0.5 CM TO CLOSEST SKIN MARGIN. - CALLED TO [facility]. [facility] AT 12:22 P.M. ON 11-17-12. . FSD1, SENTINEL LYMPH NODE: - NEGATIVE FOR MACROMETASTASIS. - PREDOMINANTLY FAT. - CALLED TO DR. SANKAR AT1:06 P.M. ON 11-17-12. [name] IOCE1, LEFT BREAST LUMPECTOMY: - BIOPSY CAVITY MARGINS APPEAR CLEAR GROSSLY. - CALLED TO DR. SANKAR AT 1:12 P.M. ON 11-17-12. [name] Quay Burow, M.D. Starlyn Skeans .                               [   Final Report         ]           ********************************************************************** Diagnosis: Part A: RIGHT SENTINEL LYMPH NODE #1: - NO TUMOR SEEN IN ONE SENTINEL LYMPH NODE (0/1). . Part B: RIGHT SENTINEL LYMPH NODE #2: - NO TUMOR SEEN IN ONESENTINEL LYMPH NODE (0/1). . Part C: RIGHT BREAST LUMPECTOMY : - INVASIVE MAMMARY CARCINOMA, NO SPECIAL TYPE. - MARGINS OF EXCISION ARE NEGATIVE. - SEE SUMMARY BELOW. [name] PROCEDURE: Excision without wire-guided localization LYMPH NODE SAMPLING:Sentinel lymph nodes, see parts A and B SPECIMEN LATERALITY: Right HISTOLOGIC TYPE OF INVASIVE CARCINOMA: Ductal, no special type TUMOR SIZE: 5 mm HISTOLOGIC GRADE: Nottingham Histologic Score 3/9 Glandular / tubular differentiation: Score 1 Nuclear pleomorphism: Score 1 Mitotic rate: Score 1 Overall grade: Grade 1 TUMOR FOCALITY: Single focus of invasive carcinoma DUCTAL CARCINOMA IN SITU (DCIS): No DCIS is present MARGINS:         Invasive carcinoma:         margins uninvolved by invasive carcinoma                 Distance from closest margin: 4 mm (anterior)         DCIS: DCIS not present LYMPH NODES: Separately submitted in parts A and B         Number of sentinel lymph nodes examined: 2         Total number of lymph nodes examined: 2         Number of lymph nodes with macrometastases: 0         Number of lymph nodes with micrometastases: 0         Number of lymph nodes with isolated tumor cells: 0         Number of lymph nodes without tumor  cells identified: 2 PATHOLOGIC STAGING, AJCC 7TH EDITION: Primary tumor: pT1a Regional lymph nodes: pN0(sn) Distant metastasis: not applicable ANCILLARY STUDIES: Testing was performed by [facility] on a separate specimen, number [id number] (B1). In summary:         Estrogen receptor IHC: Positive, 90% strong staining.         Progesterone receptor: Positive, 20% moderate staining.         HER2-neu FISH: NOT Amplified; HER2/CEP17 ratio: 1.07 . Part D: SENTINEL NODE LEFT BREAST: -  NO TUMOR SEEN IN ONE SENTINEL LYMPH NODE (0/1). . Part E: LEFT BREAST LUMPECTOMY AT 9:00: - INVASIVE MAMMARY CARCINOMA, NO SPECIAL TYPE. - LYMPH-VASCULAR INVASION IS PRESENT. - MARGINS OF EXCISION ARE NEGATIVE. - SEE SUMMARY BELOW. CANCER CASE SUMMARY: INVASIVE CARCINOMA OF THE BREAST PROCEDURE: Excision without wire-guided localization LYMPH NODE SAMPLING: Sentinel lymph node, see part E SPECIMEN LATERALITY: Left HISTOLOGIC TYPE OF INVASIVE CARCINOMA: Ductal, no special type TUMOR SIZE: 18 mm HISTOLOGIC GRADE: Nottingham Histologic Score 8/9 Glandular / tubular differentiation: Score 3 Nuclear pleomorphism: Score 3 Mitotic rate: Score 2 Overall grade: Grade 3 TUMOR FOCALITY: Single focus of invasive carcinoma DUCTAL CARCINOMA IN SITU (DCIS): No DCIS is present MARGINS:         Invasive carcinoma:         margins uninvolved by invasive carcinoma                 Distance from closest margin: 1.5 mm (superior)         DCIS: DCIS not present LYMPH NODES: Separately submitted in part D         Number of sentinel lymph nodes examined: 1         Total number of lymph nodes examined: 1         Number of lymph nodes with macrometastases: 0         Number of lymph nodes with micrometastases: 0     Number of lymph nodes with isolated tumor cells: 0         Number of lymph nodes without tumor cells identified: 1 LYMPH-VASCULAR INVASION: Present PATHOLOGIC STAGING,  AJCC 7TH EDITION: Primary tumor: pT1c         Regional lymph nodes: pN0(sn)         Distant metastasis: not applicable ANCILLARY STUDIES: Testing was performed by [facility] for Molecular Biology and Pathology on a separate specimen containing DCIS only, number [id number] (A1). In summary:         Estrogen receptor IHC: Positive, 90% strong staining.         Progesterone receptor: Positive, 20% moderate staining. Per request of the clinician, ER/PR IHC and HER2 FISH will be performed on the current specimen block E6, results to be reported in an addendum. [name] COMMENT: Preliminary results of this case were discussed with Dr. Jamal Collin on Nov 20, 2012 by Dr. Luana Shu and the final diagnosis remains unchanged. Intradepartmental consultation was obtained. . XDB/11/21/2012 ********************************************************************** .                               [   Final Report         ]                   Electronically signed:                                     . Lum Babe, MD, Pathologist .                               [   Final Report         ]                   Gross description:                                         .  A.  Received in a formalin filled container labeled Joya Gaskins and right breast sentinel node is a 3.6 x 2.5 x 1.5 cm fatty lymph node candidate with a portion of blue dye discoloration.  Bisecting demonstrates a predominantly fatty cut surface with focal areas of nodal tissue at the blue dyed areas. The blue dyed areas are submitted for frozen section as FSA1 and the frozen section remnants are entirely resubmitted in cassette A1. The remainder of the lymph node is submitted for routine processing in cassettes A2-A5. . B.  Received fresh from the operating room in a container labeled Katheren Jimmerson and right breast sentinel node is a 2.5 x 1.0 x 0.7 cm fatty lymph node candidate.  Three representative sections are submitted for  frozen section as FSB1 and the frozen section remnants are entirely resubmitted in cassette B1.  The remainder of the specimen is submitted for routine processing in cassettes B2-B3. . C.  Received fresh from the operating room in a container labeled Nizhoni Parlow and right breast lumpectomy (with margins) is a 7.0 x 6.0 x 2.9 cm fragment of yellow tan lobulated fibroadiposetissue.  Silver metallic markers designate the medial, lateral, cranial, caudal and deep margins.  There is also a marker designating the skin aspect.  The margins are inked as follows: anterior - orange; posterior - black; superior - blue; inferior - green.  Sectioning reveals a 1.1 x 0.8 x 0.5 cm hemorrhagic biopsy cavity situated 0.5 cm from the anterior margin, 1.0 cm from the inferior margin, 1.2 cm from the superior margin, 3.0 cm from the deep margin and greater than 2.0 cm from both the medial and lateral margins.  In this hemorrhagic cavity there are multiple white tan markers.  Located immediately anterior lateral to the biopsy site is a 0.5 x 0.5 x 0.5 cm white tan firm mass grossly extending 0.5 cm of the anterior margin and 0.8 cm of the superior margin.  Elsewhere the specimen consists of a predominantly fatty breast parenchyma.  Representative sections are submitted: C1 - lateral margin, perpendicular sections; C2 - mass and anterior inferior margin; C3 - mass and anterior superior margin; C4-C5 - additional representative sections of biopsy site; C6 - representative section of biopsy site and representative section of inferior margin; C7 - medial margin, perpendicular sections. The specimen is resected at 12:07 p.m. and placed in formalin at 12:25 p.m. on 11-17-12.  The approximate total fixation time is 6.5 hours. . D.  Received fresh from the operating room in a container labeled Chattie Greeson and left breast sentinel node is a 2.5x 1.5 x 0.6 cm irregular shaped partially fragmented  fatty lymph node demonstrating focal areas of blue dye discoloration.  The blue dyed discolored areas are submitted for frozen section as FSD1- FSD2.  The frozen section remnants are entirely resubmitted in cassettes D1-D2. . E.  Received fresh from the operating room in a container labeled Berline Semrad and left breast lumpectomy at 9:00 is a 7.5 x 6.4 x 4.5 cm fragment of yellow tan lobulated fibroadipose tissue with an attached 4.6 x 1.7 x 0.3 cm tan skin ellipse.  The epidermal surfaces are corrugated.  Silver markers designate the cranial, caudal, lateral, and deep margins.  The margins are inked as follows: superior - blue; inferior - green; posterior - black.  Sectioning demonstrates an ill-defined indurated area measuring approximately 1.8 x 1.4 x 1.1 cm.  The cut surface is mottled yellow tan with a focal hemorrhagic area.  This indurated  area grossly extends to 0.3 cm of the superior margin, 1.1 cm from the inferior margin, 2.5 cm from the deep margin, 1.0 cm from the overlying skin and greater than 1.5 cm from both the medial and lateral margins.  There are white tan markers within this area.  Elsewhere the specimen consists of predominantly fatty parenchyma.  Representative sections are submitted: E1 - lateral margin, perpendicular sections; E2 - indurated area overlying skin and superior margin; E3 - indurated area and superior margin; E4 - indurated area and inferior margin; E5 - indurated area and posterior margin; E6-E7 - additional representative sections of firmest area of induration; E8 - medial margin, perpendicular sections. The specimen is collected at 12:56 p.m. and placed in formalin at 1:16 p.m. on 11-17-12.  The approximate total fixation time is 7 hours. QAC/ASB .                               [   Final Report         ]                   Pathologist provided ICD-9: 174.8, 174.8 .                               [   Final Report         ] CPT                                                         . [id number], [id number], [id number], [id number], [id number], [id number], [id number], [id number], [phone], [phone], [facility]            No: [id number]           [address], [place], [place] [address]           Lindon Romp, MD         [phone]                                         [id number]   Result(s) reported on 22 Nov 2012 at 06:54AM.    12:00   ER/PR, Immunohistochem ========== TEST NAME ==========  ========= RESULTS =========  = REFERENCE RANGE =  ER/PR IMMUNOHISTOCHEM  ER/PR,Immunohistochem,Paraffin Estrogen Receptor IHC           [   >90 %                ]                   The Staining Intensity was Moderate. Progesterone Rec. IHC           [   POSITIVE, 1 to 5 % % ]                   The staining intensity was weak. Fixative                        [  Final Report         ]                   10% neutral buffered formalin Time to fixation (coldischemic time) is less than or equal to 1 hour. Fixation Time                   [   Final Report         ]                   Tissue biopsy fixation was between 6 and 72 hours. Additional Information          [   Final Report         ]      ************************************************************ * Interpretation Guide:                   Receptor Status: * * If result is:  less than 1%                   Negative   * * If result is:  greater than or equal to 1%    Positive * ************************************************************                                                            .     Note:  Evaluation of steroid receptor status by            Immunohistochemistry is semiquantitative and is  expressed as the percentage of malignant tumor            cells demonstrating specific nuclear staining.            Positive staining of stromal cells or benign            epithelium is not included.  However, such            staining, when  present, serves as an internal            positive control.                                                            . Estrogen and progesterone receptor immunohistochemical staining was performed on formalin-fixed, paraffin-embedded tissue using an anti-ER (SP1) antibody and anti-PgR (1E2) antibody, HRP-multimer based detection kit with Diaminobenzidine as the indicator and Ventana immunohistochemical staining device.               [facility]                   No: [id number] [address] [place], [place] [address]           Nechama Guard, MD      [phone]   Result(s) reported on 01 Dec 2012 at 01:41PM.  Cardiology:  12-May-14 15:18   Ventricular Rate 72  Atrial Rate 72  P-R Interval 152  QRS Duration 108  QT 416  QTc 455  P Axis 66  R Axis -21  T Axis 62  ECG interpretation Sinus  rhythm with Premature supraventricular complexes Otherwise normal ECG When compared with ECG of 01-Feb-2005 20:02, Premature supraventricular complexes are now Present QRS duration has increased Confirmed by Welton Flakes, SHAUKAT ([id number]) on 11/14/2012 9:04:10 AM  Overreader: Adrian Blackwater  Routine Chem:  12-May-14 15:12   Glucose, Serum  190  BUN  24  Creatinine (comp) 1.23  Sodium, Serum 136  Potassium, Serum 4.3  Chloride, Serum 105  CO2, Serum 26  Calcium (Total), Serum 9.4  Osmolality (calc) 281  eGFR (African American)  51  eGFR (Non-African American)  44 (eGFR values <14mL/min/1.73 m2 may be an indication of chronic kidney disease (CKD). Calculated eGFR is useful in patients with stable renal function. The eGFR calculation will not be reliable in acutely ill patients when serum creatinine is changing rapidly. It is not useful in  patients on dialysis. The eGFR calculation may not be applicable to patients at the low and high extremes of body sizes, pregnant women, and vegetarians.)  Anion Gap  5  Routine Hem:  12-May-14 15:12   WBC (CBC) 9.5  RBC (CBC) 3.93   Hemoglobin (CBC)  11.7  Hematocrit (CBC)  34.3  Platelet Count (CBC) 205  MCV 87  MCH 29.8  MCHC 34.1  RDW 13.0  Neutrophil % 70.5  Lymphocyte % 16.7  Monocyte % 6.2  Eosinophil % 6.0  Basophil % 0.6  Neutrophil #  6.7  Lymphocyte # 1.6  Monocyte # 0.6  Eosinophil # 0.6  Basophil # 0.1 (Result(s) reported on 13 Nov 2012 at 05:27PM.)   Radiology Results: XRay:    24-Apr-14 11:25, Upper GI  Upper GI   REASON FOR EXAM:    Abdominal distention  COMMENTS:       PROCEDURE: FL  - FL UPPER GI  - Oct 26 2012 11:25AM     RESULT:     Procedure: The patient received effervescent crystals followed the   administration of oral barium. Representative imaging of the upper   gastrointestinal tract was obtained.    Findings: Limited evaluation of the esophagus demonstrates no gross   abnormalities. There is appropriate relaxation of the lower esophageal   sphincter. There is no evidence of a hiatal hernia. Severe   gastroesophageal reflux is elicited to the upper esophagus.  The stomach, duodenum and duodenal bulb are unremarkable. There is   appropriate rotation of the duodenum and placement of the ligament of   Treitz.    IMPRESSION:      1. Severe gastroesophageal reflux. Otherwise, unremarkable GI evaluation.     Thank you for the opportunity to contribute to the care of your patient.         Verified By: Jani Files, M.D., MD    12-May-14 15:38, Chest PA and Lateral  Chest PA and Lateral   REASON FOR EXAM:    HTN; diabetes  COMMENTS:       PROCEDURE: DXR - DXR CHEST PA (OR AP) AND LATERAL  - Nov 13 2012  3:38PM     RESULT: The lungs are adequately inflated and clear. The cardiac   silhouette is normal in size. The mediastinum is normalin width. There   is no pleural effusion or pneumothorax. The bony thorax exhibits no acute   abnormality.    IMPRESSION:  There is no evidence of active cardiopulmonary disease.     Dictation Site: 2      Verified By: DAVID  A. [place], M.D., MD  LabUnknown:    24-Apr-14 11:25, Upper GI  PACS Image     12-May-14 15:38, Chest PA and Lateral  PACS Image    Assessment and Plan: Impression:   1.Bilateral carcinoma of breast status post bilateral lumpectomy and sentinel lymph node evaluationright side patient had a small tumor all fine millimeter with negative lymph node on the left side patient had high-grade 1.8 cm tumor with negative lymph node.  Both tumors were estrogen and progesterone receptor positive and HER-2 receptor negative by FISH had prolonged discussion with patient and familywas also discussed with Dr. Jamal Collin   as well as with Dr. Donella Stade. local adjuvant treatment patient will need bilateral breast radiation therapy.  For systemic therapy patient certainly would need and died hormonal therapy with letrozole or anastrozole for 5 years.about chemotherapy particularly if Oncotype DX of the left breast shows high chance of recurrent disease.  Patient is agreeable to consider chemotherapy if there is a high chance of recurrent diseaseOncotype DX has been sent Plan:   Oncotype DX  await till  score is available .  CC Referral:  cc: Dr. Jamal Collin.  Dr. Ronette Deter   Electronic Signatures: Jobe Gibbon (MD)  (Signed 06-Jun-14 15:21)  Authored: HISTORY OF PRESENT ILLNESS, PFSH, ROS, NURSING NOTES, PE, PAST MEDICAL HISTORY, ALLERGIES, HOME MEDICATIONS, LABS, OTHER RESULTS, ASSESSMENT AND PLAN, CC Referring Physician   Last Updated: 06-Jun-14 15:21 by Jobe Gibbon (MD)

## 2014-10-25 NOTE — Op Note (Signed)
PATIENT NAME:  Kelsey Mason, Kelsey Mason MR#:  [id number] DATE OF BIRTH:  07/04/1941  DATE OF PROCEDURE:  11/17/2012  PREOPERATIVE DIAGNOSES:  1.  Invasive carcinoma of the right breast.  2.  Ductal carcinoma in situ, left breast.   POSTOPERATIVE DIAGNOSES: 1.  Invasive carcinoma of the right breast.  2.  Ductal carcinoma in situ, left breast.   OPERATION PERFORMED:  1.  Right breast lumpectomy, with sentinel node biopsy.  2.  Left breast lumpectomy, with sentinel node biopsy.   SURGEON: Mckinley Jewel, M.D.   ANESTHESIA: General.   COMPLICATIONS: None.   ESTIMATED BLOOD LOSS: Less than 50 mL.   DRAINS: None.   PROCEDURE: The patient underwent a nuclear contrast injection preoperatively for sentinel node identification. The patient was placed in the supine position on the operating table and put to sleep with an LMA; 5 mL of diluted methylene blue was injected into the subareolar regions on both sides for additional identification of the sentinel node. Signal activity in the axilla was marginal on both sides. Both breasts and axillary regions were then prepped and draped out as a sterile field. The right side was operated on first. In the inferior portion in the axilla medially, a transverse incision was made overlying the area, where some limited signals were identified with the gamma finder. The incision was deepened through the subcutaneous tissue and deep fascia into the axillary fat pad, were 2 nodes were identified. They were fairly large, and one of them had blue dye and it had signal activity, and the other one just had signal activity. These were removed as sentinel nodes 1 and 2. They were mostly fatty-replaced nodes, and sent for frozen section. The rest of the axillary contents showed no palpable findings, and no signal activity was noted after removal of these two nodes.   The wound was irrigated and closed with 2-0 Vicryl in the deep tissue and the skin with subcuticular 4-0 Vicryl.  Following this an ultrasound probe was brought up to the field to  identify the biopsy cavity in the right breast in the outer aspect. This area was approximately at the 9 to 10 o'clock position. The area was identified with ultrasound, and using a small stab incision in the skin and a Bard UltraWire was placed through this to anchored it in place. This was done with ultrasound guidance.   Following this, a curvilinear incision was made along the 9 to 10 o'clock position and deepened through the layers. The wire was freed from the skin, and using the wire as a guide a generous core of tissue was excised out completely and noted to contain the small firm biopsy cavity within. The margins appeared to be grossly uninvolved. After ensuring proper hemostasis, the wound was irrigated and closed in layers with 2-0 Vicryl in the deep tissue and the skin with subcuticular 4-0 Vicryl. Both incisions in the axilla and in the breast were covered with Dermabond.   The frozen section on the lymph nodes subsequently came back with no evidence of macrometastases. On the left side the patient had similar minimal signal activity in the gamma finder, and initial incision was made along the inferior edge of the medial portion of the axilla and deepened through to expose the axillary fat pad. A single node with blue dye and signal activity was identified which was removed and sent as a sentinel node. There did not appear to be any other nodes, and no signal activity or blue dye  node was identified in this region. Subsequent report on this showed no evidence of macrometastases.   The wound was closed in layers with 2-0 Vicryl, the deep tissue and the skin with subcuticular 4-0 Vicryl. The patient's DCIS was located in the area of palpable thickening in the medial edge of the left breast, approximately the 9 o'clock position. A radial incision in an elliptical fashion was made to include the overlying skin since it was fairly close  to the palpable finding. The skin and subcutaneous tissue were elevated on both sides, and with careful visualization the area was completely excised out and noted to contain a firm area in the middle. Grossly, the margins appeared to be uninvolved. Ensuring hemostasis, the deeper tissues were closed with 2-0 Vicryl in 2 layers, and the skin closed with subcuticular 4-0 Vicryl, covered with Dermabond. The procedure was well-tolerated. The patient subsequently was extubated and returned to the recovery room in stable condition    ____________________________ S.Robinette Haines, MD sgs:dm D: 11/20/2012 08:31:23 ET T: 11/20/2012 12:41:58 ET JOB#: [id number]  cc: S.G. Jamal Collin, MD, <Dictator> [facility] Robinette Haines MD ELECTRONICALLY SIGNED 11/20/2012 13:30

## 2014-11-14 ENCOUNTER — Encounter: Payer: Self-pay | Admitting: Internal Medicine

## 2014-11-14 ENCOUNTER — Ambulatory Visit (INDEPENDENT_AMBULATORY_CARE_PROVIDER_SITE_OTHER): Payer: Medicare Other | Admitting: Internal Medicine

## 2014-11-14 VITALS — BP 148/72 | HR 91 | Temp 98.2°F | Ht 62.5 in | Wt 166.1 lb

## 2014-11-14 DIAGNOSIS — E119 Type 2 diabetes mellitus without complications: Secondary | ICD-10-CM | POA: Diagnosis not present

## 2014-11-14 DIAGNOSIS — C50411 Malignant neoplasm of upper-outer quadrant of right female breast: Secondary | ICD-10-CM

## 2014-11-14 DIAGNOSIS — J0100 Acute maxillary sinusitis, unspecified: Secondary | ICD-10-CM | POA: Diagnosis not present

## 2014-11-14 DIAGNOSIS — I10 Essential (primary) hypertension: Secondary | ICD-10-CM

## 2014-11-14 LAB — COMPREHENSIVE METABOLIC PANEL
ALK PHOS: 77 U/L (ref 39–117)
ALT: 17 U/L (ref 0–35)
AST: 16 U/L (ref 0–37)
Albumin: 4.2 g/dL (ref 3.5–5.2)
BUN: 12 mg/dL (ref 6–23)
CHLORIDE: 98 meq/L (ref 96–112)
CO2: 29 mEq/L (ref 19–32)
Calcium: 9.7 mg/dL (ref 8.4–10.5)
Creatinine, Ser: 1.1 mg/dL (ref 0.40–1.20)
GFR: 51.65 mL/min — ABNORMAL LOW (ref >60.00)
Glucose, Bld: 240 mg/dL — ABNORMAL HIGH (ref 70–99)
POTASSIUM: 4.2 meq/L (ref 3.5–5.1)
SODIUM: 132 meq/L — AB (ref 135–145)
TOTAL PROTEIN: 6.7 g/dL (ref 6.0–8.3)
Total Bilirubin: 0.6 mg/dL (ref 0.2–1.2)

## 2014-11-14 LAB — LIPID PANEL
Cholesterol: 176 mg/dL (ref 0–200)
HDL: 56.4 mg/dL (ref >39.00)
LDL Cholesterol: 85 mg/dL (ref 0–99)
NONHDL: 119.6
Total CHOL/HDL Ratio: 3
Triglycerides: 172 mg/dL — ABNORMAL HIGH (ref 0.0–149.0)
VLDL: 34.4 mg/dL (ref 0.0–40.0)

## 2014-11-14 LAB — MICROALBUMIN / CREATININE URINE RATIO
CREATININE, U: 189 mg/dL
Microalb Creat Ratio: 4.4 mg/g (ref 0.0–30.0)
Microalb, Ur: 8.3 mg/dL — ABNORMAL HIGH (ref 0.0–1.9)

## 2014-11-14 LAB — HEMOGLOBIN A1C: Hgb A1c MFr Bld: 8.2 % — ABNORMAL HIGH (ref 4.6–6.5)

## 2014-11-14 MED ORDER — HYDROCOD POLST-CPM POLST ER 10-8 MG/5ML PO SUER: 5.0000 mL | Freq: Two times a day (BID) | ORAL | Status: DC | PRN

## 2014-11-14 MED ORDER — AZITHROMYCIN 250 MG PO TABS: ORAL_TABLET | ORAL | Status: DC

## 2014-11-14 NOTE — Assessment & Plan Note (Signed)
BP Readings from Last 3 Encounters:  11/14/14 148/72  08/15/14 176/67  07/24/14 143/76   BP improved compared to previous. Will continue current medications. Renal function with labs.

## 2014-11-14 NOTE — Progress Notes (Signed)
Subjective:    Patient ID: Kelsey Mason, female    DOB: 20-Mar-1941, 74 y.o.   MRN: [id number]  HPI  74YO female presents for follow up.  DM - BG have been up and down. Some BG over 200. Lowest near 140. Compliant with medication.  Notes sinus pressure and diffuse rhinorrhea over the last couple of weeks..Starting yesterday, developed watery eyes, runny nose. Dry cough. No dyspnea. No chest pain. No fever.  Taking OTC anthistamines with no improvement.   Past medical, surgical, family and social history per today's encounter.  Review of Systems  Constitutional: Positive for fatigue. Negative for fever, chills, appetite change and unexpected weight change.  HENT: Positive for congestion, postnasal drip, rhinorrhea, sinus pressure, sneezing and sore throat. Negative for trouble swallowing and voice change.   Eyes: Negative for visual disturbance.  Respiratory: Positive for cough. Negative for chest tightness, shortness of breath and wheezing.   Cardiovascular: Negative for chest pain and leg swelling.  Gastrointestinal: Negative for abdominal pain, diarrhea and constipation.  Musculoskeletal: Positive for myalgias and arthralgias.  Skin: Negative for color change and rash.  Hematological: Negative for adenopathy. Does not bruise/bleed easily.  Psychiatric/Behavioral: Positive for sleep disturbance. Negative for dysphoric mood. The patient is not nervous/anxious.        Objective:    BP 148/72 mmHg  Pulse 91  Temp(Src) 98.2 F (36.8 C) (Oral)  Ht 5' 2.5" (1.588 m)  Wt 166 lb 2 oz (75.354 kg)  BMI 29.88 kg/m2  SpO2 99% Physical Exam  Constitutional: She is oriented to person, place, and time. She appears well-developed and well-nourished. No distress.  HENT:  Head: Normocephalic and atraumatic.  Right Ear: External ear normal.  Left Ear: External ear normal.  Nose: Nose normal.  Mouth/Throat: Oropharynx is clear and moist. No oropharyngeal exudate.  Eyes:  Conjunctivae are normal. Pupils are equal, round, and reactive to light. Right eye exhibits no discharge. Left eye exhibits no discharge. No scleral icterus.  Neck: Normal range of motion. Neck supple. No tracheal deviation present. No thyromegaly present.  Cardiovascular: Normal rate, regular rhythm, normal heart sounds and intact distal pulses.  Exam reveals no gallop and no friction rub.   No murmur heard. Pulmonary/Chest: Effort normal and breath sounds normal. No respiratory distress. She has no wheezes. She has no rales. She exhibits no tenderness.  Musculoskeletal: Normal range of motion. She exhibits no edema or tenderness.  Lymphadenopathy:    She has no cervical adenopathy.  Neurological: She is alert and oriented to person, place, and time. No cranial nerve deficit. She exhibits normal muscle tone. Coordination normal.  Skin: Skin is warm and dry. No rash noted. She is not diaphoretic. No erythema. No pallor.  Psychiatric: She has a normal mood and affect. Her behavior is normal. Judgment and thought content normal.          Assessment & Plan:   Problem List Items Addressed This Visit      Unprioritized   Acute maxillary sinusitis - Primary    Symptoms c/w acute maxillary sinusitis. Will treat with azithromycin. Tussionex prn cough. Nasal saline mist prn.Follow up next week prn.      Relevant Medications   azithromycin (ZITHROMAX Z-PAK) 250 MG tablet   chlorpheniramine-HYDROcodone (TUSSIONEX PENNKINETIC ER) 10-8 MG/5ML SUER   Diabetes mellitus type 2, controlled    Will check A1c with labs today.      Relevant Orders   Comprehensive metabolic panel   Hemoglobin A1c   Lipid  panel   Microalbumin / creatinine urine ratio   Hypertension    BP Readings from Last 3 Encounters:  11/14/14 148/72  08/15/14 176/67  07/24/14 143/76   BP improved compared to previous. Will continue current medications. Renal function with labs.      Malignant neoplasm of upper-outer  quadrant of female breast right, T1,N0,M0. ER/PR pos, Her 2 neg    Tolerating Femara well.          Return in about 3 months (around 02/14/2015) for Recheck of Diabetes.

## 2014-11-14 NOTE — Assessment & Plan Note (Signed)
Will check A1c with labs today. 

## 2014-11-14 NOTE — Assessment & Plan Note (Signed)
Tolerating Femara well.

## 2014-11-14 NOTE — Progress Notes (Signed)
Pre visit review using our clinic review tool, if applicable. No additional management support is needed unless otherwise documented below in the visit note. 

## 2014-11-14 NOTE — Patient Instructions (Signed)
Start Azithromycin to help treat sinus infection.  Use Tussionex syrup to help with cough.  Follow up next week if symptoms are not improving.  Labs today.

## 2014-11-14 NOTE — Assessment & Plan Note (Addendum)
Symptoms c/w acute maxillary sinusitis. Will treat with azithromycin. Tussionex prn cough. Nasal saline mist prn.Follow up next week prn.

## 2014-11-22 ENCOUNTER — Telehealth: Payer: Self-pay | Admitting: Internal Medicine

## 2014-11-22 NOTE — Telephone Encounter (Signed)
Pt dropped off blood sugar readings. List in Dr. Thomes Dinning box/msn

## 2014-12-07 ENCOUNTER — Other Ambulatory Visit: Payer: Self-pay | Admitting: Internal Medicine

## 2014-12-16 ENCOUNTER — Encounter: Payer: Self-pay | Admitting: Internal Medicine

## 2014-12-16 ENCOUNTER — Ambulatory Visit (INDEPENDENT_AMBULATORY_CARE_PROVIDER_SITE_OTHER): Payer: Medicare Other | Admitting: Internal Medicine

## 2014-12-16 VITALS — BP 138/72 | HR 77 | Temp 98.5°F | Ht 62.5 in | Wt 164.1 lb

## 2014-12-16 DIAGNOSIS — E785 Hyperlipidemia, unspecified: Secondary | ICD-10-CM

## 2014-12-16 DIAGNOSIS — I10 Essential (primary) hypertension: Secondary | ICD-10-CM | POA: Diagnosis not present

## 2014-12-16 DIAGNOSIS — M791 Myalgia: Secondary | ICD-10-CM | POA: Diagnosis not present

## 2014-12-16 DIAGNOSIS — I739 Peripheral vascular disease, unspecified: Secondary | ICD-10-CM | POA: Insufficient documentation

## 2014-12-16 DIAGNOSIS — M609 Myositis, unspecified: Secondary | ICD-10-CM

## 2014-12-16 DIAGNOSIS — E119 Type 2 diabetes mellitus without complications: Secondary | ICD-10-CM | POA: Diagnosis not present

## 2014-12-16 DIAGNOSIS — IMO0001 Reserved for inherently not codable concepts without codable children: Secondary | ICD-10-CM

## 2014-12-16 MED ORDER — SAXAGLIPTIN HCL 5 MG PO TABS
5.0000 mg | ORAL_TABLET | Freq: Every day | ORAL | Status: DC
Start: 2014-12-16 — End: 2015-02-19

## 2014-12-16 NOTE — Progress Notes (Signed)
Pre visit review using our clinic review tool, if applicable. No additional management support is needed unless otherwise documented below in the visit note. 

## 2014-12-16 NOTE — Patient Instructions (Addendum)
Continue Metformin, Glimepiride and Onglzya to help control your blood sugars.  Monitor blood sugars once daily.

## 2014-12-16 NOTE — Progress Notes (Signed)
Subjective:    Patient ID: Kelsey Mason, female    DOB: March 19, 1941, 74 y.o.   MRN: [id number]  HPI  74YO female presents for follow up.  DM - last had A1c of 8.2% on 11/14/2014. Currently prescribed Onglyza, Glimepiride, and Metformin. Recent BG have been 150s-250s. Reports forgetting medication about 1x per day. Has not been taking Onglyza.  Myalgia - Concerned about muscle pain in thighs and arms since starting on Femara. Not taking anything for this at present. Pain is made worse by physical activity. Improved with rest.  Past medical, surgical, family and social history per today's encounter.  Review of Systems  Constitutional: Negative for fever, chills, appetite change, fatigue and unexpected weight change.  Eyes: Negative for visual disturbance.  Respiratory: Negative for shortness of breath.   Cardiovascular: Negative for chest pain and leg swelling.  Gastrointestinal: Negative for abdominal pain.  Musculoskeletal: Positive for myalgias, back pain and arthralgias. Negative for joint swelling.  Skin: Negative for color change and rash.  Neurological: Positive for tremors (occasional in hands).  Hematological: Negative for adenopathy. Does not bruise/bleed easily.  Psychiatric/Behavioral: Negative for dysphoric mood. The patient is not nervous/anxious.        Objective:    BP 138/72 mmHg  Pulse 77  Temp(Src) 98.5 F (36.9 C) (Oral)  Ht 5' 2.5" (1.588 m)  Wt 164 lb 2 oz (74.447 kg)  BMI 29.52 kg/m2  SpO2 99% Physical Exam  Constitutional: She is oriented to person, place, and time. She appears well-developed and well-nourished. No distress.  HENT:  Head: Normocephalic and atraumatic.  Right Ear: External ear normal.  Left Ear: External ear normal.  Nose: Nose normal.  Mouth/Throat: Oropharynx is clear and moist. No oropharyngeal exudate.  Eyes: Conjunctivae are normal. Pupils are equal, round, and reactive to light. Right eye exhibits no discharge. Left  eye exhibits no discharge. No scleral icterus.  Neck: Normal range of motion. Neck supple. No tracheal deviation present. No thyromegaly present.  Cardiovascular: Normal rate, regular rhythm, normal heart sounds and intact distal pulses.  Exam reveals no gallop and no friction rub.   No murmur heard. Pulmonary/Chest: Effort normal and breath sounds normal. No respiratory distress. She has no wheezes. She has no rales. She exhibits no tenderness.  Musculoskeletal: Normal range of motion. She exhibits no edema or tenderness.  Lymphadenopathy:    She has no cervical adenopathy.  Neurological: She is alert and oriented to person, place, and time. No cranial nerve deficit. She exhibits normal muscle tone. Coordination normal.  Skin: Skin is warm and dry. No rash noted. She is not diaphoretic. No erythema. No pallor.  Psychiatric: She has a normal mood and affect. Her behavior is normal. Judgment and thought content normal.          Assessment & Plan:   Problem List Items Addressed This Visit      Unprioritized   Diabetes mellitus type 2, controlled - Primary    BG continue to be elevated, however she has not been compliant with medications. Encouraged compliance with medication. Discussed potential need for insulin if BG continue to be elevated. She does not want to start insulin at this time.      Relevant Medications   saxagliptin HCl (ONGLYZA) 5 MG TABS tablet   Hyperlipidemia    Off statins because of myalgia and use of Femara. Will monitor.      Hypertension    BP Readings from Last 3 Encounters:  12/16/14 138/72  11/14/14  148/72  08/15/14 176/67   BP well controlled. Recent renal function normal. Had been off BP medications because of hypotension during chemotherapy. Will monitor for now.      Myalgia and myositis    Recent myalgia likely related to use of Femara. Encouraged her to discuss possible change of medications with her oncologist, Dr. Jeb Levering.           Return in about 8 weeks (around 02/10/2015) for Recheck of Diabetes.

## 2014-12-16 NOTE — Assessment & Plan Note (Signed)
Off statins because of myalgia and use of Femara. Will monitor.

## 2014-12-16 NOTE — Assessment & Plan Note (Signed)
Recent myalgia likely related to use of Femara. Encouraged her to discuss possible change of medications with her oncologist, Dr. Jeb Levering.

## 2014-12-16 NOTE — Assessment & Plan Note (Signed)
BP Readings from Last 3 Encounters:  12/16/14 138/72  11/14/14 148/72  08/15/14 176/67   BP well controlled. Recent renal function normal. Had been off BP medications because of hypotension during chemotherapy. Will monitor for now.

## 2014-12-16 NOTE — Assessment & Plan Note (Signed)
BG continue to be elevated, however she has not been compliant with medications. Encouraged compliance with medication. Discussed potential need for insulin if BG continue to be elevated. She does not want to start insulin at this time.

## 2015-01-17 ENCOUNTER — Other Ambulatory Visit: Payer: Self-pay | Admitting: Internal Medicine

## 2015-01-22 ENCOUNTER — Other Ambulatory Visit: Payer: Self-pay

## 2015-01-22 DIAGNOSIS — C50312 Malignant neoplasm of lower-inner quadrant of left female breast: Secondary | ICD-10-CM

## 2015-01-22 DIAGNOSIS — C50411 Malignant neoplasm of upper-outer quadrant of right female breast: Secondary | ICD-10-CM

## 2015-02-14 ENCOUNTER — Other Ambulatory Visit: Payer: Self-pay | Admitting: *Deleted

## 2015-02-14 ENCOUNTER — Encounter: Payer: Self-pay | Admitting: Internal Medicine

## 2015-02-14 ENCOUNTER — Ambulatory Visit (INDEPENDENT_AMBULATORY_CARE_PROVIDER_SITE_OTHER): Payer: Medicare Other | Admitting: Internal Medicine

## 2015-02-14 VITALS — BP 151/72 | HR 79 | Temp 98.2°F | Ht 62.5 in | Wt 162.1 lb

## 2015-02-14 DIAGNOSIS — E119 Type 2 diabetes mellitus without complications: Secondary | ICD-10-CM | POA: Diagnosis not present

## 2015-02-14 DIAGNOSIS — E785 Hyperlipidemia, unspecified: Secondary | ICD-10-CM | POA: Diagnosis not present

## 2015-02-14 DIAGNOSIS — R61 Generalized hyperhidrosis: Secondary | ICD-10-CM | POA: Diagnosis not present

## 2015-02-14 DIAGNOSIS — I10 Essential (primary) hypertension: Secondary | ICD-10-CM

## 2015-02-14 DIAGNOSIS — C50411 Malignant neoplasm of upper-outer quadrant of right female breast: Secondary | ICD-10-CM

## 2015-02-14 LAB — COMPREHENSIVE METABOLIC PANEL
ALBUMIN: 4.4 g/dL (ref 3.5–5.2)
ALK PHOS: 73 U/L (ref 39–117)
ALT: 17 U/L (ref 0–35)
AST: 14 U/L (ref 0–37)
BUN: 20 mg/dL (ref 6–23)
CHLORIDE: 97 meq/L (ref 96–112)
CO2: 27 meq/L (ref 19–32)
Calcium: 10 mg/dL (ref 8.4–10.5)
Creatinine, Ser: 1.71 mg/dL — ABNORMAL HIGH (ref 0.40–1.20)
GFR: 31.02 mL/min — AB (ref >60.00)
GLUCOSE: 184 mg/dL — AB (ref 70–99)
POTASSIUM: 4.1 meq/L (ref 3.5–5.1)
Sodium: 134 mEq/L — ABNORMAL LOW (ref 135–145)
TOTAL PROTEIN: 6.7 g/dL (ref 6.0–8.3)
Total Bilirubin: 0.8 mg/dL (ref 0.2–1.2)

## 2015-02-14 LAB — LIPID PANEL
CHOL/HDL RATIO: 3
Cholesterol: 119 mg/dL (ref 0–200)
HDL: 38 mg/dL — AB (ref >39.00)
LDL Cholesterol: 50 mg/dL (ref 0–99)
NONHDL: 81.17
TRIGLYCERIDES: 158 mg/dL — AB (ref 0.0–149.0)
VLDL: 31.6 mg/dL (ref 0.0–40.0)

## 2015-02-14 LAB — MICROALBUMIN / CREATININE URINE RATIO
Creatinine,U: 124.1 mg/dL
MICROALB/CREAT RATIO: 11.9 mg/g (ref 0.0–30.0)
Microalb, Ur: 14.8 mg/dL — ABNORMAL HIGH (ref 0.0–1.9)

## 2015-02-14 LAB — HEMOGLOBIN A1C: Hgb A1c MFr Bld: 8 % — ABNORMAL HIGH (ref 4.6–6.5)

## 2015-02-14 LAB — TSH: TSH: 1.71 u[IU]/mL (ref 0.35–4.50)

## 2015-02-14 LAB — CBC
HCT: 37.1 % (ref 36.0–46.0)
Hemoglobin: 12.7 g/dL (ref 12.0–15.0)
MCHC: 34.2 g/dL (ref 30.0–36.0)
MCV: 88.2 fl (ref 78.0–100.0)
Platelets: 214 10*3/uL (ref 150.0–400.0)
RBC: 4.21 Mil/uL (ref 3.87–5.11)
RDW: 13.4 % (ref 11.5–15.5)
WBC: 10 10*3/uL (ref 4.0–10.5)

## 2015-02-14 LAB — HM DIABETES FOOT EXAM: HM Diabetic Foot Exam: NORMAL

## 2015-02-14 MED ORDER — INSULIN GLARGINE 100 UNIT/ML SOLOSTAR PEN: 10.0000 [IU] | PEN_INJECTOR | Freq: Every day | SUBCUTANEOUS | Status: DC

## 2015-02-14 MED ORDER — AMLODIPINE BESYLATE 5 MG PO TABS: 5.0000 mg | ORAL_TABLET | Freq: Every day | ORAL | Status: DC

## 2015-02-14 NOTE — Assessment & Plan Note (Signed)
Recent episodes of diaphoresis when exposed to hotter temps. Will check electrolytes, TSH with labs. Encouraged adequate hydration.

## 2015-02-14 NOTE — Progress Notes (Signed)
Pre visit review using our clinic review tool, if applicable. No additional management support is needed unless otherwise documented below in the visit note. 

## 2015-02-14 NOTE — Patient Instructions (Signed)
Labs today.  Please continue to increase fluid intake and avoid prolonged time outdoors in the heat.

## 2015-02-14 NOTE — Assessment & Plan Note (Signed)
BG fairly well controlled by report. Will check A1c with labs. Continue current medications. Foot exam normal today.

## 2015-02-14 NOTE — Assessment & Plan Note (Signed)
Continue on Femara. Follow up with oncology as scheduled.

## 2015-02-14 NOTE — Assessment & Plan Note (Signed)
BP Readings from Last 3 Encounters:  02/14/15 151/72  12/16/14 138/72  11/14/14 148/72   BP slightly elevated today. Will monitor for now. Continue current medications.

## 2015-02-14 NOTE — Assessment & Plan Note (Signed)
Will check lipids with labs. Continue Crestor.

## 2015-02-14 NOTE — Progress Notes (Signed)
Subjective:    Patient ID: Kelsey Mason, female    DOB: 1940/10/21, 74 y.o.   MRN: [id number]  HPI  74YO female presents for follow up.  DM - BG mostly mid 100s. Few outliers near 200.  Having some episodes of feeling hot in recent weather, and feels nauseous during these episodes. One episode of vomiting. No nausea or vomiting between episodes. No CP or other symptoms. No abdominal pain. Drinking water during day. Able to mow her own yard, without symptoms, when taking frequent breaks.  Breast cancer - has follow up with oncology next month. Continues on Femara.    Past Medical History  Diagnosis Date  . Diabetes mellitus   . Hypertension   . Breast symptom 2014  . Cancer     Bilateral Breast, chemo Dr. Jeb Levering   Family History  Problem Relation Age of Onset  . Alcohol abuse Mother   . Heart disease Mother   . Hypertension Mother   . Alcohol abuse Father   . Heart disease Father   . Hypertension Father   . Heart disease Brother    Past Surgical History  Procedure Laterality Date  . Cholecystectomy  2007  . Abdominal hysterectomy  1976    for pelvic pain  . Tumor removal Right 1980    right foot  . Oophorectomy Right   . Breast surgery Bilateral 2014  . Portacath placement  2014   Social History   Social History  . Marital Status: Widowed    Spouse Name: N/A  . Number of Children: N/A  . Years of Education: N/A   Social History Main Topics  . Smoking status: Former Smoker -- 0.25 packs/day for 1 years    Types: Cigarettes  . Smokeless tobacco: Never Used  . Alcohol Use: No  . Drug Use: No  . Sexual Activity: Not Asked   Other Topics Concern  . None   Social History Narrative   Lives in [place].       Works - [employer]   Diet - regular   Exercise - dance 3x per week    Review of Systems  Constitutional: Positive for diaphoresis. Negative for fever, chills, appetite change, fatigue and unexpected weight change.  Eyes:  Negative for visual disturbance.  Respiratory: Negative for shortness of breath.   Cardiovascular: Negative for chest pain and leg swelling.  Gastrointestinal: Positive for nausea and vomiting. Negative for abdominal pain, diarrhea and constipation.  Musculoskeletal: Negative for myalgias and arthralgias.  Skin: Negative for color change and rash.  Hematological: Negative for adenopathy. Does not bruise/bleed easily.  Psychiatric/Behavioral: Negative for sleep disturbance and dysphoric mood. The patient is not nervous/anxious.        Objective:    BP 151/72 mmHg  Pulse 79  Temp(Src) 98.2 F (36.8 C) (Oral)  Ht 5' 2.5" (1.588 m)  Wt 162 lb 2 oz (73.539 kg)  BMI 29.16 kg/m2  SpO2 97% Physical Exam  Constitutional: She is oriented to person, place, and time. She appears well-developed and well-nourished. No distress.  HENT:  Head: Normocephalic and atraumatic.  Right Ear: External ear normal.  Left Ear: External ear normal.  Nose: Nose normal.  Mouth/Throat: Oropharynx is clear and moist. No oropharyngeal exudate.  Eyes: Conjunctivae are normal. Pupils are equal, round, and reactive to light. Right eye exhibits no discharge. Left eye exhibits no discharge. No scleral icterus.  Neck: Normal range of motion. Neck supple. No tracheal deviation present. No thyromegaly present.  Cardiovascular: Normal rate, regular rhythm, normal heart sounds and intact distal pulses.  Exam reveals no gallop and no friction rub.   No murmur heard. Pulmonary/Chest: Effort normal and breath sounds normal. No respiratory distress. She has no wheezes. She has no rales. She exhibits no tenderness.  Musculoskeletal: Normal range of motion. She exhibits no edema or tenderness.  Lymphadenopathy:    She has no cervical adenopathy.  Neurological: She is alert and oriented to person, place, and time. No cranial nerve deficit. She exhibits normal muscle tone. Coordination normal.  Skin: Skin is warm and dry. No  rash noted. She is not diaphoretic. No erythema. No pallor.  Psychiatric: She has a normal mood and affect. Her behavior is normal. Judgment and thought content normal.          Assessment & Plan:   Problem List Items Addressed This Visit      Unprioritized   Diabetes mellitus type 2, controlled - Primary    BG fairly well controlled by report. Will check A1c with labs. Continue current medications. Foot exam normal today.      Relevant Orders   Comprehensive metabolic panel   Hemoglobin A1c   Lipid panel   Microalbumin / creatinine urine ratio   Diaphoresis    Recent episodes of diaphoresis when exposed to hotter temps. Will check electrolytes, TSH with labs. Encouraged adequate hydration.      Relevant Orders   TSH   CBC   Hyperlipidemia    Will check lipids with labs. Continue Crestor.      Hypertension    BP Readings from Last 3 Encounters:  02/14/15 151/72  12/16/14 138/72  11/14/14 148/72   BP slightly elevated today. Will monitor for now. Continue current medications.      Malignant neoplasm of upper-outer quadrant of female breast right, T1,N0,M0. ER/PR pos, Her 2 neg    Continue on Femara. Follow up with oncology as scheduled.          Return in about 3 months (around 05/17/2015) for Recheck of Diabetes.

## 2015-02-19 ENCOUNTER — Ambulatory Visit (INDEPENDENT_AMBULATORY_CARE_PROVIDER_SITE_OTHER): Payer: Medicare Other | Admitting: Internal Medicine

## 2015-02-19 ENCOUNTER — Encounter: Payer: Self-pay | Admitting: Internal Medicine

## 2015-02-19 VITALS — BP 147/71 | HR 71 | Temp 97.7°F | Ht 62.5 in | Wt 162.4 lb

## 2015-02-19 DIAGNOSIS — E119 Type 2 diabetes mellitus without complications: Secondary | ICD-10-CM | POA: Diagnosis not present

## 2015-02-19 DIAGNOSIS — I10 Essential (primary) hypertension: Secondary | ICD-10-CM | POA: Diagnosis not present

## 2015-02-19 LAB — BASIC METABOLIC PANEL
BUN: 18 mg/dL (ref 6–23)
CO2: 26 mEq/L (ref 19–32)
CREATININE: 1.36 mg/dL — AB (ref 0.40–1.20)
Calcium: 9.6 mg/dL (ref 8.4–10.5)
Chloride: 99 mEq/L (ref 96–112)
GFR: 40.4 mL/min — ABNORMAL LOW (ref >60.00)
Glucose, Bld: 181 mg/dL — ABNORMAL HIGH (ref 70–99)
Potassium: 3.9 mEq/L (ref 3.5–5.1)
Sodium: 135 mEq/L (ref 135–145)

## 2015-02-19 MED ORDER — SAXAGLIPTIN HCL 5 MG PO TABS: 2.5000 mg | ORAL_TABLET | Freq: Every day | ORAL | Status: DC

## 2015-02-19 MED ORDER — INSULIN PEN NEEDLE 32G X 6 MM MISC: Status: DC

## 2015-02-19 MED ORDER — INSULIN GLARGINE 100 UNIT/ML SOLOSTAR PEN: 15.0000 [IU] | PEN_INJECTOR | Freq: Every day | SUBCUTANEOUS | Status: DC

## 2015-02-19 NOTE — Progress Notes (Signed)
Pre visit review using our clinic review tool, if applicable. No additional management support is needed unless otherwise documented below in the visit note. 

## 2015-02-19 NOTE — Assessment & Plan Note (Signed)
BG elevated. Stopped Metformin and started Lantus. Will increase Lantus to 15units daily. Will set up nutrition counseling. Follow up in 4 weeks and prn.

## 2015-02-19 NOTE — Progress Notes (Signed)
Subjective:    Patient ID: Kelsey Mason, female    DOB: 1941-01-19, 74 y.o.   MRN: [id number]  HPI  74YO female presents for follow up.  Last seen 8/12. Labs showed elevated Cr. 1.7. Metformin and Benazapril held. Started Amlodipine. No side effects noted.  DM - BG mostly 100-200 on Lantus 10units. No low BG. On one day, took 30units with now low BG. Stopped Metformin.   Past Medical History  Diagnosis Date  . Diabetes mellitus   . Hypertension   . Breast symptom 2014  . Cancer     Bilateral Breast, chemo Dr. Jeb Levering   Family History  Problem Relation Age of Onset  . Alcohol abuse Mother   . Heart disease Mother   . Hypertension Mother   . Alcohol abuse Father   . Heart disease Father   . Hypertension Father   . Heart disease Brother    Past Surgical History  Procedure Laterality Date  . Cholecystectomy  2007  . Abdominal hysterectomy  1976    for pelvic pain  . Tumor removal Right 1980    right foot  . Oophorectomy Right   . Breast surgery Bilateral 2014  . Portacath placement  2014   Social History   Social History  . Marital Status: Widowed    Spouse Name: N/A  . Number of Children: N/A  . Years of Education: N/A   Social History Main Topics  . Smoking status: Former Smoker -- 0.25 packs/day for 1 years    Types: Cigarettes  . Smokeless tobacco: Never Used  . Alcohol Use: No  . Drug Use: No  . Sexual Activity: Not Asked   Other Topics Concern  . None   Social History Narrative   Lives in [place].       Works - [employer]   Diet - regular   Exercise - dance 3x per week    Review of Systems  Constitutional: Negative for fever, chills, appetite change, fatigue and unexpected weight change.  Eyes: Negative for visual disturbance.  Respiratory: Negative for shortness of breath.   Cardiovascular: Negative for chest pain and leg swelling.  Gastrointestinal: Negative for nausea, vomiting, abdominal pain, diarrhea and constipation.   Musculoskeletal: Negative for myalgias and arthralgias.  Skin: Negative for color change and rash.  Hematological: Negative for adenopathy. Does not bruise/bleed easily.  Psychiatric/Behavioral: Negative for dysphoric mood. The patient is not nervous/anxious.        Objective:    BP 147/71 mmHg  Pulse 71  Temp(Src) 97.7 F (36.5 C) (Oral)  Ht 5' 2.5" (1.588 m)  Wt 162 lb 6 oz (73.653 kg)  BMI 29.21 kg/m2  SpO2 99% Physical Exam  Constitutional: She is oriented to person, place, and time. She appears well-developed and well-nourished. No distress.  HENT:  Head: Normocephalic and atraumatic.  Right Ear: External ear normal.  Left Ear: External ear normal.  Nose: Nose normal.  Mouth/Throat: Oropharynx is clear and moist.  Eyes: Conjunctivae are normal. Pupils are equal, round, and reactive to light. Right eye exhibits no discharge. Left eye exhibits no discharge. No scleral icterus.  Neck: Normal range of motion. Neck supple. No tracheal deviation present. No thyromegaly present.  Cardiovascular: Normal rate, regular rhythm, normal heart sounds and intact distal pulses.  Exam reveals no gallop and no friction rub.   No murmur heard. Pulmonary/Chest: Effort normal and breath sounds normal. No respiratory distress. She has no wheezes. She has no rales. She exhibits  no tenderness.  Musculoskeletal: Normal range of motion. She exhibits no edema or tenderness.  Lymphadenopathy:    She has no cervical adenopathy.  Neurological: She is alert and oriented to person, place, and time. No cranial nerve deficit. She exhibits normal muscle tone. Coordination normal.  Skin: Skin is warm and dry. No rash noted. She is not diaphoretic. No erythema. No pallor.  Psychiatric: She has a normal mood and affect. Her behavior is normal. Judgment and thought content normal.          Assessment & Plan:   Problem List Items Addressed This Visit      Unprioritized   Diabetes mellitus type 2,  controlled - Primary    BG elevated. Stopped Metformin and started Lantus. Will increase Lantus to 15units daily. Will set up nutrition counseling. Follow up in 4 weeks and prn.      Relevant Medications   Insulin Glargine (LANTUS) 100 UNIT/ML Solostar Pen   saxagliptin HCl (ONGLYZA) 5 MG TABS tablet   Other Relevant Orders   Ambulatory referral to diabetic education   Hypertension    BP Readings from Last 3 Encounters:  02/19/15 147/71  02/14/15 151/72  12/16/14 138/72   BP slightly elevated. Will recheck renal function today. If improved, then will try to restart Benazapril.          Return in about 4 weeks (around 03/19/2015) for Recheck.

## 2015-02-19 NOTE — Patient Instructions (Addendum)
We will set up evaluation with a nutritionist.  Decrease Onglyza to 2.5mg  daily.  Increase Lantus to 15units daily.  Continue to monitor blood sugars.  Labs today.

## 2015-02-19 NOTE — Addendum Note (Signed)
Addended by: Karlene Einstein D on: 02/19/2015 12:02 PM   Modules accepted: Orders

## 2015-02-19 NOTE — Assessment & Plan Note (Signed)
BP Readings from Last 3 Encounters:  02/19/15 147/71  02/14/15 151/72  12/16/14 138/72   BP slightly elevated. Will recheck renal function today. If improved, then will try to restart Benazapril.

## 2015-03-04 ENCOUNTER — Encounter: Payer: Self-pay | Admitting: Dietician

## 2015-03-04 ENCOUNTER — Encounter: Payer: Medicare Other | Attending: Internal Medicine | Admitting: Dietician

## 2015-03-04 ENCOUNTER — Other Ambulatory Visit: Payer: Self-pay | Admitting: *Deleted

## 2015-03-04 VITALS — Ht 62.0 in | Wt 163.6 lb

## 2015-03-04 DIAGNOSIS — E119 Type 2 diabetes mellitus without complications: Secondary | ICD-10-CM

## 2015-03-04 DIAGNOSIS — C50411 Malignant neoplasm of upper-outer quadrant of right female breast: Secondary | ICD-10-CM

## 2015-03-04 NOTE — Patient Instructions (Signed)
Balance meals with protein, 1-2 starch servings ( carbohydrate), and non-starchy vegetables. Can add fruit to meals.  Limit added fats such as mayonnaise, salad dressings, margarine. Read labels for carbohydrate. Can subtract fiber. Compare to the number 15. Keep meals in 30 gm range with no more than 45 gms of carbohydrate.

## 2015-03-04 NOTE — Progress Notes (Signed)
Medical Nutrition Therapy: Visit start time: 9:00am  end time: 10:00am Assessment:  Diagnosis: Type 2 diabetes Past medical history: hypertension, elevated cholesterol,  Psychosocial issues/ stress concerns: Pt. Rates her stress as moderate and indicates she is dealing "very well" with her stress. Preferred learning method:  . Auditory . Hands-on Current weight: 163.6 lbs  Height: 62 in Medications, supplements: see list Progress and evaluation: Patient in for initial medical nutrition assessment. Reports that she has been a "picky eater" since childhood and reports that after her chemo treatments for breast cancer, there are even more foods in which she cannot tolerate the taste. She states that her FBG's readings are in range of 140 to 200 which is an improvement since beginning Lantus insulin 1 month ago. Her present intake is significantly low in fruits/vegetables with frequent high fat food choices. Includes frequent added fats such as mayonnaise and will only eat broccoli or carrots with onion dip. She would like to lose weight.  Physical activity: dances 2-3x/week for 1-1.5 hours. She states she has never been able to understand concept of carbohydrate servings.  Dietary Intake:  Usual eating pattern includes 3 meals and 3 snacks per day. Dining out frequency: 6 meals per week.  Breakfast: 6:00-9:00 range- ham biscuit, hash brown potatoes, coffee-black or chicken biscuit or hash browns prepared at home Snack: crackers or grapes or pecans Lunch: apple with crackers or baked chicken/ pintos/water or Coke Snack: similar to morning snack Supper: cheeseburger or hotdog or TV dinner or chicken with pintos Snack: same as other snacks Beverages: water, coffee, coke-2-3x/week  Nutrition Care Education:  Diabetes:   Instructed on a meal plan for diabetes based on 1300 Calories to promote weight loss including identifying carbohydrate foods, portion control and the need to balance carbohydrate  with protein and non-starchy vegetables when possible. Also, instructed on basic label reading. Used food models, food guide plate with explanation.  Nutritional Diagnosis:  NI-1.5 Excessive energy intake As related to high fat choices.  As evidenced by diet history..  Intervention:  Balance meals with protein, 1-2 starch servings ( carbohydrate), and non-starchy vegetables. Can add fruit to meals.  Limit added fats such as mayonnaise, salad dressings, margarine. Read labels for carbohydrate. Can subtract fiber. Compare to the number 15. Keep meals in 30 gm range with no more than 45 gms of carbohydrate per meal. Spread 8-9 servings over 3 meals and 1-2 snacks.  Education Materials given:  . Food lists/ Planning A Balanced Meal . Sample meal pattern/ menus . Goals/ instructions Learner/ who was taught:  . Patient  Level of understanding: . Partial understanding; needs review/ practice Learning barriers: . None  Willingness to learn/ readiness for change: . Acceptance, ready for change  Monitoring and Evaluation:  No follow-up scheduled. Patient stated "This was very helpful". She preferred to try suggestions and call if she needs further help with diet/nutrition.

## 2015-03-11 ENCOUNTER — Encounter: Payer: Self-pay | Admitting: Oncology

## 2015-03-11 ENCOUNTER — Inpatient Hospital Stay: Payer: Medicare Other | Attending: Oncology

## 2015-03-11 ENCOUNTER — Inpatient Hospital Stay (HOSPITAL_BASED_OUTPATIENT_CLINIC_OR_DEPARTMENT_OTHER): Payer: Medicare Other | Admitting: Oncology

## 2015-03-11 VITALS — BP 147/84 | HR 88 | Temp 97.7°F | Wt 162.2 lb

## 2015-03-11 DIAGNOSIS — Z79899 Other long term (current) drug therapy: Secondary | ICD-10-CM | POA: Insufficient documentation

## 2015-03-11 DIAGNOSIS — C50911 Malignant neoplasm of unspecified site of right female breast: Secondary | ICD-10-CM | POA: Diagnosis not present

## 2015-03-11 DIAGNOSIS — Z7982 Long term (current) use of aspirin: Secondary | ICD-10-CM

## 2015-03-11 DIAGNOSIS — Z79811 Long term (current) use of aromatase inhibitors: Secondary | ICD-10-CM

## 2015-03-11 DIAGNOSIS — C50411 Malignant neoplasm of upper-outer quadrant of right female breast: Secondary | ICD-10-CM

## 2015-03-11 DIAGNOSIS — Z170 Estrogen receptor positive status [ER+]: Secondary | ICD-10-CM

## 2015-03-11 DIAGNOSIS — C50912 Malignant neoplasm of unspecified site of left female breast: Secondary | ICD-10-CM | POA: Diagnosis present

## 2015-03-11 DIAGNOSIS — I10 Essential (primary) hypertension: Secondary | ICD-10-CM

## 2015-03-11 DIAGNOSIS — Z9221 Personal history of antineoplastic chemotherapy: Secondary | ICD-10-CM

## 2015-03-11 DIAGNOSIS — E119 Type 2 diabetes mellitus without complications: Secondary | ICD-10-CM

## 2015-03-11 DIAGNOSIS — Z923 Personal history of irradiation: Secondary | ICD-10-CM | POA: Diagnosis not present

## 2015-03-11 LAB — COMPREHENSIVE METABOLIC PANEL
ALK PHOS: 78 U/L (ref 38–126)
ALT: 20 U/L (ref 14–54)
ANION GAP: 8 (ref 5–15)
AST: 18 U/L (ref 15–41)
Albumin: 4.6 g/dL (ref 3.5–5.0)
BUN: 22 mg/dL — ABNORMAL HIGH (ref 6–20)
CALCIUM: 9.1 mg/dL (ref 8.9–10.3)
CO2: 27 mmol/L (ref 22–32)
CREATININE: 1.28 mg/dL — AB (ref 0.44–1.00)
Chloride: 97 mmol/L — ABNORMAL LOW (ref 101–111)
GFR calc Af Amer: 47 mL/min — ABNORMAL LOW (ref >60)
GFR, EST NON AFRICAN AMERICAN: 40 mL/min — AB (ref >60)
Glucose, Bld: 235 mg/dL — ABNORMAL HIGH (ref 65–99)
Potassium: 4.5 mmol/L (ref 3.5–5.1)
SODIUM: 132 mmol/L — AB (ref 135–145)
Total Bilirubin: 0.7 mg/dL (ref 0.3–1.2)
Total Protein: 6.8 g/dL (ref 6.5–8.1)

## 2015-03-11 LAB — CBC WITH DIFFERENTIAL/PLATELET
Basophils Absolute: 0.1 10*3/uL (ref 0–0.1)
Basophils Relative: 1 %
Eosinophils Absolute: 0.4 10*3/uL (ref 0–0.7)
Eosinophils Relative: 6 %
HCT: 37.5 % (ref 35.0–47.0)
Hemoglobin: 12.8 g/dL (ref 12.0–16.0)
Lymphocytes Relative: 16 %
Lymphs Abs: 1.1 10*3/uL (ref 1.0–3.6)
MCH: 29.4 pg (ref 26.0–34.0)
MCHC: 34.3 g/dL (ref 32.0–36.0)
MCV: 85.7 fL (ref 80.0–100.0)
Monocytes Absolute: 0.5 10*3/uL (ref 0.2–0.9)
Monocytes Relative: 7 %
Neutro Abs: 4.9 10*3/uL (ref 1.4–6.5)
Neutrophils Relative %: 70 %
Platelets: 187 10*3/uL (ref 150–440)
RBC: 4.37 MIL/uL (ref 3.80–5.20)
RDW: 13.6 % (ref 11.5–14.5)
WBC: 7 10*3/uL (ref 3.6–11.0)

## 2015-03-11 NOTE — Progress Notes (Signed)
Patient does have living will.  Former smoker.  Patient c/o extreme fatigue.

## 2015-03-11 NOTE — Progress Notes (Signed)
[place] @ [facility] [phone]  Fax:([phone]) [place] OB: [birth year]  MR#: [id number]  XBW#:[id number]  Patient Care Team: Jackolyn Confer, MD as PCP - General (Internal Medicine) Christene Lye, MD as Consulting Physician (General Surgery)  CHIEF COMPLAINT:  Chief Complaint  Patient presents with  . Follow-up    Chief Complaint/Diagnosis:   74 year old female with bilateral breast cancer Right breast stage I (T1 A. N0 M0) Left breast T1 C. N0 M0 Both ER/PR positive HER-2/neu negative Oncotype DX score is 28 2.chemotherapy with Cytoxan Adriamycin and Neulasta every 3 weeks for 4 times followed by Taxol once a week 80 mg per meter square   12  times  followed by radiation therapyto the both breast 3.chemotherapy is finished on June 05, 2013.  Total   10 treatment of Taxol was given(progressive anemia neuropathy and fatigue) 4.patient  started bilateral breast radiation(January, 201 5) 5.patient  has finished radiation therapy in March of 2015 6.   Started on letrozole from March, 2015   INTERVAL HISTORY:   74 year old lady with a history of carcinoma of breast.  Left breast patient had done high Oncot ype   Dx score and received chemotherapy. Now on letrozole No bony pain no bony fracture.  Patient is going to get mammogram done next week.  At outside institution.  Here for further follow-up and treatment consideration REVIEW OF SYSTEMS:   GENERAL:  Feels good.  Active.  No fevers, sweats or weight loss. PERFORMANCE STATUS (ECOG):  0 HEENT:  No visual changes, runny nose, sore throat, mouth sores or tenderness. Lungs: No shortness of breath or cough.  No hemoptysis. Cardiac:  No chest pain, palpitations, orthopnea, or PND. GI:  No nausea, vomiting, diarrhea, constipation, melena or hematochezia. GU:  No urgency, frequency, dysuria, or hematuria. Musculoskeletal:  No back pain.  No joint pain.  No muscle  tenderness. Extremities:  No pain or swelling. Skin:  No rashes or skin changes. Neuro:  No headache, numbness or weakness, balance or coordination issues. Endocrine:  No diabetes, thyroid issues, hot flashes or night sweats. Psych:  No mood changes, depression or anxiety. Pain:  No focal pain. Review of systems:  All other systems reviewed and found to be negative. As per HPI. Otherwise, a complete review of systems is negatve.  PAST MEDICAL HISTORY: Past Medical History  Diagnosis Date  . Diabetes mellitus   . Hypertension   . Breast symptom 2014  . Cancer     Bilateral Breast, chemo Dr. Jeb Levering    PAST SURGICAL HISTORY: Past Surgical History  Procedure Laterality Date  . Cholecystectomy  2007  . Abdominal hysterectomy  1976    for pelvic pain  . Tumor removal Right 1980    right foot  . Oophorectomy Right   . Breast surgery Bilateral 2014  . Portacath placement  2014    FAMILY HISTORY Family History  Problem Relation Age of Onset  . Alcohol abuse Mother   . Heart disease Mother   . Hypertension Mother   . Alcohol abuse Father   . Heart disease Father   . Hypertension Father   . Heart disease Brother     ADVANCED DIRECTIVES:  Patient does have advance healthcare directive, Patient   does not desire to make any changes HEALTH MAINTENANCE: Social History  Substance Use Topics  . Smoking status: Former Smoker -- 0.25 packs/day for 1 years    Types: Cigarettes  . Smokeless tobacco: Never  Used  . Alcohol Use: No      Allergies  Allergen Reactions  . Advil [Ibuprofen] Other (See Comments)    Dizziness   . Aleve [Naproxen] Other (See Comments)    Dizziness  . Fluticasone-Salmeterol Other (See Comments)    dizziness  . Lipitor [Atorvastatin] Other (See Comments)    Leg pain, muscle ache  . Penicillins Hives    Current Outpatient Prescriptions  Medication Sig Dispense Refill  . amLODipine (NORVASC) 5 MG tablet Take 1 tablet (5 mg total) by mouth daily.  30 tablet 3  . aspirin 81 MG tablet Take 81 mg by mouth daily.    . blood glucose meter kit and supplies KIT Dispense based on patient and insurance preference. Use up to four times daily as directed. One Touch Ultra mini meter (FOR ICD-9 250.00, 250.01). Dx: E11.9 1 each 0  . Calcium Carb-Cholecalciferol (SM CALCIUM/VITAMIN D) 600-800 MG-UNIT TABS Take by mouth.    [name] glimepiride (AMARYL) 2 MG tablet TAKE 1 TABLET (2 MG TOTAL) BY MOUTH 2 (TWO) TIMES DAILY. 180 tablet 1  . Insulin Glargine (LANTUS) 100 UNIT/ML Solostar Pen Inject 15 Units into the skin daily at 10 pm. 3 mL 3  . Insulin Pen Needle 32G X 6 MM MISC Use as directed with Lantus pen 100 each 3  . letrozole (FEMARA) 2.5 MG tablet Take by mouth.    . ONE TOUCH ULTRA TEST test strip USE TO TEST BLOOD SUGAR UP TO 4 TIMES DAILY 100 each 6  . saxagliptin HCl (ONGLYZA) 5 MG TABS tablet Take 0.5 tablets (2.5 mg total) by mouth daily. 30 tablet 11  . Beclomethasone Dipropionate (QNASL) 80 MCG/ACT AERS Place 1 spray into the nose daily. (Patient not taking: Reported on 03/04/2015) 8.7 g 3  . clonazePAM (KLONOPIN) 0.5 MG tablet Take 1 tablet (0.5 mg total) by mouth 2 (two) times daily as needed for anxiety. (Patient not taking: Reported on 03/11/2015) 60 tablet 3  . CRESTOR 10 MG tablet TAKE 1 TABLET (10 MG TOTAL) BY MOUTH DAILY. (Patient not taking: Reported on 03/04/2015) 90 tablet 3   No current facility-administered medications for this visit.    OBJECTIVE:  Filed Vitals:   03/11/15 1501  BP: 147/84  Pulse: 88  Temp: 97.7 F (36.5 C)     Body mass index is 29.67 kg/(m^2).    ECOG FS:0 - Asymptomatic  PHYSICAL EXAM: GENERAL:  Well developed, well nourished, sitting comfortably in the exam room in no acute distress. MENTAL STATUS:  Alert and oriented to person, place and time.   RESPIRATORY:  Clear to auscultation without rales, wheezes or rhonchi. CARDIOVASCULAR:  Regular rate and rhythm without murmur, rub or gallop. BREAST:  Right  breast without masses, skin changes or nipple discharge.  Left breast without masses, skin changes or nipple discharge. ABDOMEN:  Soft, non-tender, with active bowel sounds, and no hepatosplenomegaly.  No masses. BACK:  No CVA tenderness.  No tenderness on percussion of the back or rib cage. SKIN:  No rashes, ulcers or lesions. EXTREMITIES: No edema, no skin discoloration or tenderness.  No palpable cords. LYMPH NODES: No palpable cervical, supraclavicular, axillary or inguinal adenopathy  NEUROLOGICAL: Unremarkable. PSYCH:  Appropriate.  LAB RESULTS:  CBC Latest Ref Rng 03/11/2015 02/14/2015  WBC 3.6 - 11.0 K/uL 7.0 10.0  Hemoglobin 12.0 - 16.0 g/dL 12.8 12.7  Hematocrit 35.0 - 47.0 % 37.5 37.1  Platelets 150 - 440 K/uL 187 214.0    Appointment on 03/11/2015  Component  Date Value Ref Range Status  . WBC 03/11/2015 7.0  3.6 - 11.0 K/uL Final  . RBC 03/11/2015 4.37  3.80 - 5.20 MIL/uL Final  . Hemoglobin 03/11/2015 12.8  12.0 - 16.0 g/dL Final  . HCT 03/11/2015 37.5  35.0 - 47.0 % Final  . MCV 03/11/2015 85.7  80.0 - 100.0 fL Final  . MCH 03/11/2015 29.4  26.0 - 34.0 pg Final  . MCHC 03/11/2015 34.3  32.0 - 36.0 g/dL Final  . RDW 03/11/2015 13.6  11.5 - 14.5 % Final  . Platelets 03/11/2015 187  150 - 440 K/uL Final  . Neutrophils Relative % 03/11/2015 70   Final  . Neutro Abs 03/11/2015 4.9  1.4 - 6.5 K/uL Final  . Lymphocytes Relative 03/11/2015 16   Final  . Lymphs Abs 03/11/2015 1.1  1.0 - 3.6 K/uL Final  . Monocytes Relative 03/11/2015 7   Final  . Monocytes Absolute 03/11/2015 0.5  0.2 - 0.9 K/uL Final  . Eosinophils Relative 03/11/2015 6   Final  . Eosinophils Absolute 03/11/2015 0.4  0 - 0.7 K/uL Final  . Basophils Relative 03/11/2015 1   Final  . Basophils Absolute 03/11/2015 0.1  0 - 0.1 K/uL Final  . Sodium 03/11/2015 132* 135 - 145 mmol/L Final  . Potassium 03/11/2015 4.5  3.5 - 5.1 mmol/L Final  . Chloride 03/11/2015 97* 101 - 111 mmol/L Final  . CO2 03/11/2015 27   22 - 32 mmol/L Final  . Glucose, Bld 03/11/2015 235* 65 - 99 mg/dL Final  . BUN 03/11/2015 22* 6 - 20 mg/dL Final  . Creatinine, Ser 03/11/2015 1.28* 0.44 - 1.00 mg/dL Final  . Calcium 03/11/2015 9.1  8.9 - 10.3 mg/dL Final  . Total Protein 03/11/2015 6.8  6.5 - 8.1 g/dL Final  . Albumin 03/11/2015 4.6  3.5 - 5.0 g/dL Final  . AST 03/11/2015 18  15 - 41 U/L Final  . ALT 03/11/2015 20  14 - 54 U/L Final  . Alkaline Phosphatase 03/11/2015 78  38 - 126 U/L Final  . Total Bilirubin 03/11/2015 0.7  0.3 - 1.2 mg/dL Final  . GFR calc non Af Amer 03/11/2015 40* >60 mL/min Final  . GFR calc Af Amer 03/11/2015 47* >60 mL/min Final   Comment: (NOTE) The eGFR has been calculated using the CKD EPI equation. This calculation has not been validated in all clinical situations. eGFR's persistently <60 mL/min signify possible Chronic Kidney Disease.   . Anion gap 03/11/2015 8  5 - 15 Final       STUDIES: Bilateral diagnostic mammogram has been scheduled next week outside institution ASSESSMENT: All lab data has been reviewed.  Patient has bilateral carcinoma of breast Patient in the left with lumpectomy radiation therapy because of high Oncotype DX score patient  Received chemotherapy followed by letrozole, calcium and vitamin D  MEDICAL DECISION MAKING:  Bone density study No clinical evidence of recurrent disease  Patient expressed understanding and was in agreement with this plan. She also understands that She can call clinic at any time with any questions, concerns, or complaints.    No matching staging information was found for the patient.  Forest Gleason, MD   03/11/2015 3:13 PM

## 2015-03-25 ENCOUNTER — Encounter: Payer: Self-pay | Admitting: Internal Medicine

## 2015-03-25 ENCOUNTER — Ambulatory Visit (INDEPENDENT_AMBULATORY_CARE_PROVIDER_SITE_OTHER): Payer: Medicare Other | Admitting: Internal Medicine

## 2015-03-25 VITALS — BP 128/72 | HR 76 | Temp 98.4°F | Ht 62.5 in | Wt 165.2 lb

## 2015-03-25 DIAGNOSIS — I10 Essential (primary) hypertension: Secondary | ICD-10-CM | POA: Diagnosis not present

## 2015-03-25 DIAGNOSIS — E1122 Type 2 diabetes mellitus with diabetic chronic kidney disease: Secondary | ICD-10-CM

## 2015-03-25 DIAGNOSIS — Z23 Encounter for immunization: Secondary | ICD-10-CM | POA: Diagnosis not present

## 2015-03-25 DIAGNOSIS — N183 Chronic kidney disease, stage 3 unspecified: Secondary | ICD-10-CM | POA: Insufficient documentation

## 2015-03-25 DIAGNOSIS — E119 Type 2 diabetes mellitus without complications: Secondary | ICD-10-CM

## 2015-03-25 DIAGNOSIS — N1832 Chronic kidney disease, stage 3b: Secondary | ICD-10-CM | POA: Insufficient documentation

## 2015-03-25 MED ORDER — ROSUVASTATIN CALCIUM 10 MG PO TABS: ORAL_TABLET | ORAL | Status: DC

## 2015-03-25 NOTE — Assessment & Plan Note (Signed)
BP Readings from Last 3 Encounters:  03/25/15 128/72  03/11/15 147/84  02/19/15 147/71   BP well controlled. Recent renal function was improved.

## 2015-03-25 NOTE — Assessment & Plan Note (Signed)
Recent renal function improved. Discussed need to avoid any nephrotoxic meds. Repeat labs in 05/2015.

## 2015-03-25 NOTE — Patient Instructions (Signed)
Labs in November, then follow up visit.

## 2015-03-25 NOTE — Progress Notes (Signed)
Subjective:    Patient ID: Kelsey Mason, female    DOB: 11-May-1941, 74 y.o.   MRN: [id number]  HPI  74YO female presents for follow up.  DM - BG running higher. Highest 243. Notes some dietary indiscretion. Average near 190-200. Out of insulin about 1 week because of cost.  Aside from this, feeling well. Recently had mammogram and bone density which were normal.  Past Medical History  Diagnosis Date  . Diabetes mellitus   . Hypertension   . Breast symptom 2014  . Cancer     Bilateral Breast, chemo Dr. Jeb Levering   Family History  Problem Relation Age of Onset  . Alcohol abuse Mother   . Heart disease Mother   . Hypertension Mother   . Alcohol abuse Father   . Heart disease Father   . Hypertension Father   . Heart disease Brother    Past Surgical History  Procedure Laterality Date  . Cholecystectomy  2007  . Abdominal hysterectomy  1976    for pelvic pain  . Tumor removal Right 1980    right foot  . Oophorectomy Right   . Breast surgery Bilateral 2014  . Portacath placement  2014   Social History   Social History  . Marital Status: Widowed    Spouse Name: N/A  . Number of Children: N/A  . Years of Education: N/A   Social History Main Topics  . Smoking status: Former Smoker -- 0.25 packs/day for 1 years    Types: Cigarettes  . Smokeless tobacco: Never Used  . Alcohol Use: No  . Drug Use: No  . Sexual Activity: Not Asked   Other Topics Concern  . None   Social History Narrative   Lives in [place].       Works - [employer]   Diet - regular   Exercise - dance 3x per week    Review of Systems  Constitutional: Negative for fever, chills, appetite change, fatigue and unexpected weight change.  Eyes: Negative for visual disturbance.  Respiratory: Negative for shortness of breath.   Cardiovascular: Negative for chest pain and leg swelling.  Gastrointestinal: Negative for nausea, vomiting, abdominal pain, diarrhea and constipation.    Musculoskeletal: Negative for myalgias and arthralgias.  Skin: Negative for color change and rash.  Hematological: Negative for adenopathy. Does not bruise/bleed easily.  Psychiatric/Behavioral: Negative for dysphoric mood. The patient is not nervous/anxious.        Objective:    BP 128/72 mmHg  Pulse 76  Temp(Src) 98.4 F (36.9 C) (Oral)  Ht 5' 2.5" (1.588 m)  Wt 165 lb 4 oz (74.957 kg)  BMI 29.72 kg/m2  SpO2 98% Physical Exam  Constitutional: She is oriented to person, place, and time. She appears well-developed and well-nourished. No distress.  HENT:  Head: Normocephalic and atraumatic.  Right Ear: External ear normal.  Left Ear: External ear normal.  Nose: Nose normal.  Mouth/Throat: Oropharynx is clear and moist. No oropharyngeal exudate.  Eyes: Conjunctivae are normal. Pupils are equal, round, and reactive to light. Right eye exhibits no discharge. Left eye exhibits no discharge. No scleral icterus.  Neck: Normal range of motion. Neck supple. No tracheal deviation present. No thyromegaly present.  Cardiovascular: Normal rate, regular rhythm, normal heart sounds and intact distal pulses.  Exam reveals no gallop and no friction rub.   No murmur heard. Pulmonary/Chest: Effort normal and breath sounds normal. No respiratory distress. She has no wheezes. She has no rales. She exhibits  no tenderness.  Musculoskeletal: Normal range of motion. She exhibits no edema or tenderness.  Lymphadenopathy:    She has no cervical adenopathy.  Neurological: She is alert and oriented to person, place, and time. No cranial nerve deficit. She exhibits normal muscle tone. Coordination normal.  Skin: Skin is warm and dry. No rash noted. She is not diaphoretic. No erythema. No pallor.  Psychiatric: She has a normal mood and affect. Her behavior is normal. Judgment and thought content normal.          Assessment & Plan:   Problem List Items Addressed This Visit      Unprioritized   CKD  stage 3 due to type 2 diabetes mellitus    Recent renal function improved. Discussed need to avoid any nephrotoxic meds. Repeat labs in 05/2015.      Relevant Medications   rosuvastatin (CRESTOR) 10 MG tablet   Diabetes mellitus type 2, controlled - Primary    BG increased, however pt unable to consistently afford medication. Discussed some options including changing to regular or NPH insulin, but she prefers to hold off for now. Repeat A1c in 05/2015.      Relevant Medications   rosuvastatin (CRESTOR) 10 MG tablet   Other Relevant Orders   Comprehensive metabolic panel   Hemoglobin A1c   Lipid panel   Microalbumin / creatinine urine ratio   Hypertension    BP Readings from Last 3 Encounters:  03/25/15 128/72  03/11/15 147/84  02/19/15 147/71   BP well controlled. Recent renal function was improved.      Relevant Medications   rosuvastatin (CRESTOR) 10 MG tablet       Return in about 2 months (around 05/25/2015) for Recheck of Diabetes.

## 2015-03-25 NOTE — Assessment & Plan Note (Signed)
BG increased, however pt unable to consistently afford medication. Discussed some options including changing to regular or NPH insulin, but she prefers to hold off for now. Repeat A1c in 05/2015.

## 2015-03-25 NOTE — Progress Notes (Signed)
Pre visit review using our clinic review tool, if applicable. No additional management support is needed unless otherwise documented below in the visit note. 

## 2015-03-31 ENCOUNTER — Other Ambulatory Visit: Payer: Self-pay | Admitting: Internal Medicine

## 2015-04-01 ENCOUNTER — Encounter: Payer: Self-pay | Admitting: General Surgery

## 2015-04-01 ENCOUNTER — Ambulatory Visit (INDEPENDENT_AMBULATORY_CARE_PROVIDER_SITE_OTHER): Payer: Medicare Other | Admitting: General Surgery

## 2015-04-01 VITALS — BP 128/68 | HR 70 | Resp 14 | Ht 62.0 in | Wt 177.0 lb

## 2015-04-01 DIAGNOSIS — C50411 Malignant neoplasm of upper-outer quadrant of right female breast: Secondary | ICD-10-CM

## 2015-04-01 DIAGNOSIS — C50312 Malignant neoplasm of lower-inner quadrant of left female breast: Secondary | ICD-10-CM | POA: Diagnosis not present

## 2015-04-01 NOTE — Patient Instructions (Signed)
Return in 1 year for bilateral diagnostic mammogram. Return if desired for skin tag removal.

## 2015-04-01 NOTE — Progress Notes (Signed)
Patient ID: Kelsey Mason, female   DOB: [birth year], 74 y.o.   MRN: [id number]  Chief Complaint  Patient presents with  . Follow-up    mammmogram    HPI Kelsey Mason is a 74 y.o. female who presents for a breast cancer follow-up. The most recent mammogram was done on 03/26/15 .  Patient does perform regular self breast checks and gets regular mammograms done.    HPI  Past Medical History  Diagnosis Date  . Diabetes mellitus   . Hypertension   . Breast symptom 2014  . Cancer     Bilateral Breast, chemo Dr. Jeb Levering    Past Surgical History  Procedure Laterality Date  . Cholecystectomy  2007  . Abdominal hysterectomy  1976    for pelvic pain  . Tumor removal Right 1980    right foot  . Oophorectomy Right   . Breast surgery Bilateral 2014    bilateral lumpectomy and SN biopsy  . Portacath placement  2014    Family History  Problem Relation Age of Onset  . Alcohol abuse Mother   . Heart disease Mother   . Hypertension Mother   . Alcohol abuse Father   . Heart disease Father   . Hypertension Father   . Heart disease Brother     Social History Social History  Substance Use Topics  . Smoking status: Former Smoker -- 0.25 packs/day for 1 years    Types: Cigarettes  . Smokeless tobacco: Never Used  . Alcohol Use: No    Allergies  Allergen Reactions  . Advil [Ibuprofen] Other (See Comments)    Dizziness   . Aleve [Naproxen] Other (See Comments)    Dizziness  . Fluticasone-Salmeterol Other (See Comments)    dizziness  . Lipitor [Atorvastatin] Other (See Comments)    Leg pain, muscle ache  . Penicillins Hives    Current Outpatient Prescriptions  Medication Sig Dispense Refill  . amLODipine (NORVASC) 5 MG tablet Take 1 tablet (5 mg total) by mouth daily. 30 tablet 3  . aspirin 81 MG tablet Take 81 mg by mouth daily.    . Beclomethasone Dipropionate (QNASL) 80 MCG/ACT AERS Place 1 spray into the nose daily. 8.7 g 3  . blood glucose meter kit  and supplies KIT Dispense based on patient and insurance preference. Use up to four times daily as directed. One Touch Ultra mini meter (FOR ICD-9 250.00, 250.01). Dx: E11.9 1 each 0  . Calcium Carb-Cholecalciferol (SM CALCIUM/VITAMIN D) 600-800 MG-UNIT TABS Take by mouth.    . clonazePAM (KLONOPIN) 0.5 MG tablet Take 1 tablet (0.5 mg total) by mouth 2 (two) times daily as needed for anxiety. 60 tablet 3  . glimepiride (AMARYL) 2 MG tablet TAKE 1 TABLET (2 MG TOTAL) BY MOUTH 2 (TWO) TIMES DAILY. 180 tablet 1  . Insulin Glargine (LANTUS) 100 UNIT/ML Solostar Pen Inject 15 Units into the skin daily at 10 pm. 3 mL 3  . Insulin Pen Needle 32G X 6 MM MISC Use as directed with Lantus pen 100 each 3  . letrozole (FEMARA) 2.5 MG tablet Take by mouth.    . ONE TOUCH ULTRA TEST test strip USE TO TEST BLOOD SUGAR UP TO 4 TIMES DAILY 100 each 6  . rosuvastatin (CRESTOR) 10 MG tablet TAKE 1 TABLET (10 MG TOTAL) BY MOUTH DAILY. 90 tablet 3  . saxagliptin HCl (ONGLYZA) 5 MG TABS tablet Take 0.5 tablets (2.5 mg total) by mouth daily. 30 tablet 11   No  current facility-administered medications for this visit.    Review of Systems Review of Systems  Constitutional: Negative.   Respiratory: Negative.   Cardiovascular: Negative.     Blood pressure 128/68, pulse 70, resp. rate 14, height '5\' 2"'  (1.575 m), weight 177 lb (80.287 kg).  Physical Exam Physical Exam  Constitutional: She is oriented to person, place, and time. She appears well-developed and well-nourished.  Eyes: Conjunctivae are normal. No scleral icterus.  Neck: Neck supple.  Cardiovascular: Normal rate, regular rhythm and normal heart sounds.   Pulmonary/Chest: Effort normal and breath sounds normal. Right breast exhibits inverted nipple. Right breast exhibits no mass, no nipple discharge, no skin change and no tenderness. Left breast exhibits inverted nipple. Left breast exhibits no mass, no nipple discharge, no skin change and no tenderness.   Mild post-radiation changes in both breasts, unchanged from a year ago.  Lymphadenopathy:    She has no cervical adenopathy.    She has no axillary adenopathy.  Neurological: She is alert and oriented to person, place, and time.  Skin: Skin is warm and dry.    Data Reviewed Mammogram reviewed  Assessment    Bilateral breast cancer, currently 2.5 years post-lumpectomy and SN biopsy and radiation/chemo. Currently on Letrozole and doing well. Skin tag. chest wall and right popliteal area.     Plan    The patient has been asked to return to the office in one year with a bilateral diagnostic mammogram. Patient can return if she desires for skin tag removal.     PCP:  Fulton Reek 04/01/2015, 2:52 PM

## 2015-05-20 ENCOUNTER — Encounter: Payer: Self-pay | Admitting: Internal Medicine

## 2015-05-20 ENCOUNTER — Ambulatory Visit (INDEPENDENT_AMBULATORY_CARE_PROVIDER_SITE_OTHER): Payer: Medicare Other | Admitting: Internal Medicine

## 2015-05-20 VITALS — BP 142/82 | HR 80 | Temp 98.1°F | Ht 62.0 in | Wt 164.4 lb

## 2015-05-20 DIAGNOSIS — N183 Chronic kidney disease, stage 3 unspecified: Secondary | ICD-10-CM

## 2015-05-20 DIAGNOSIS — C50411 Malignant neoplasm of upper-outer quadrant of right female breast: Secondary | ICD-10-CM

## 2015-05-20 DIAGNOSIS — E1122 Type 2 diabetes mellitus with diabetic chronic kidney disease: Secondary | ICD-10-CM

## 2015-05-20 DIAGNOSIS — Z794 Long term (current) use of insulin: Secondary | ICD-10-CM | POA: Diagnosis not present

## 2015-05-20 DIAGNOSIS — T451X5A Adverse effect of antineoplastic and immunosuppressive drugs, initial encounter: Secondary | ICD-10-CM

## 2015-05-20 DIAGNOSIS — G4700 Insomnia, unspecified: Secondary | ICD-10-CM

## 2015-05-20 DIAGNOSIS — G620 Drug-induced polyneuropathy: Secondary | ICD-10-CM

## 2015-05-20 DIAGNOSIS — I10 Essential (primary) hypertension: Secondary | ICD-10-CM

## 2015-05-20 LAB — COMPREHENSIVE METABOLIC PANEL
ALBUMIN: 4.4 g/dL (ref 3.5–5.2)
ALK PHOS: 79 U/L (ref 39–117)
ALT: 13 U/L (ref 0–35)
AST: 10 U/L (ref 0–37)
BILIRUBIN TOTAL: 0.5 mg/dL (ref 0.2–1.2)
BUN: 16 mg/dL (ref 6–23)
CALCIUM: 9.6 mg/dL (ref 8.4–10.5)
CHLORIDE: 101 meq/L (ref 96–112)
CO2: 29 mEq/L (ref 19–32)
CREATININE: 1.16 mg/dL (ref 0.40–1.20)
GFR: 48.51 mL/min — ABNORMAL LOW (ref >60.00)
Glucose, Bld: 206 mg/dL — ABNORMAL HIGH (ref 70–99)
Potassium: 4.4 mEq/L (ref 3.5–5.1)
SODIUM: 135 meq/L (ref 135–145)
TOTAL PROTEIN: 6.1 g/dL (ref 6.0–8.3)

## 2015-05-20 LAB — LIPID PANEL
CHOL/HDL RATIO: 3
Cholesterol: 133 mg/dL (ref 0–200)
HDL: 47.6 mg/dL (ref >39.00)
LDL CALC: 71 mg/dL (ref 0–99)
NONHDL: 85.78
Triglycerides: 72 mg/dL (ref 0.0–149.0)
VLDL: 14.4 mg/dL (ref 0.0–40.0)

## 2015-05-20 LAB — HEMOGLOBIN A1C: HEMOGLOBIN A1C: 8.5 % — AB (ref 4.6–6.5)

## 2015-05-20 NOTE — Progress Notes (Signed)
Pre visit review using our clinic review tool, if applicable. No additional management support is needed unless otherwise documented below in the visit note. 

## 2015-05-20 NOTE — Assessment & Plan Note (Signed)
Discussed some options for OTC meds and prescription meds to help with sleep. Also discussed good sleep hygiene. She prefers to hold off on any changes for now. Follow up prn.

## 2015-05-20 NOTE — Assessment & Plan Note (Signed)
BG generally well controlled. Will check A1c with labs today. She would like to get off Lantus. Discussed possible change to Victoza. However, she would like to wait for now. Will continue Onglyza, Glimepiride and Lantus.

## 2015-05-20 NOTE — Patient Instructions (Signed)
Labs today.   Follow up in 3 months.  

## 2015-05-20 NOTE — Assessment & Plan Note (Signed)
Reviewed notes from Dr. Jamal Collin. Will continue Femara. Reviewed recent mammogram. Follow up mammogram in 1 year.

## 2015-05-20 NOTE — Progress Notes (Signed)
Subjective:    Patient ID: Kelsey Mason, female    DOB: 1940/07/15, 74 y.o.   MRN: [id number]  HPI  74YO female presents for follow up.  Insomnia - Having some early waking. Does not want to start medication for sleep. Denies anxiety. Denies pain.  DM - BG near mid 100s. Few readings in 200s. Compliant with medications.  Breast cancer - Recently seen by breast surgeon. Mammogram stable. Continues on Femara.  Wt Readings from Last 3 Encounters:  05/20/15 164 lb 6 oz (74.56 kg)  04/01/15 177 lb (80.287 kg)  03/25/15 165 lb 4 oz (74.957 kg)   BP Readings from Last 3 Encounters:  05/20/15 142/82  04/01/15 128/68  03/25/15 128/72    Past Medical History  Diagnosis Date  . Diabetes mellitus   . Hypertension   . Breast symptom 2014  . Cancer (HCC)     Bilateral Breast, chemo Dr. Jeb Levering   Family History  Problem Relation Age of Onset  . Alcohol abuse Mother   . Heart disease Mother   . Hypertension Mother   . Alcohol abuse Father   . Heart disease Father   . Hypertension Father   . Heart disease Brother    Past Surgical History  Procedure Laterality Date  . Cholecystectomy  2007  . Abdominal hysterectomy  1976    for pelvic pain  . Tumor removal Right 1980    right foot  . Oophorectomy Right   . Breast surgery Bilateral 2014    bilateral lumpectomy and SN biopsy  . Portacath placement  2014   Social History   Social History  . Marital Status: Widowed    Spouse Name: N/A  . Number of Children: N/A  . Years of Education: N/A   Social History Main Topics  . Smoking status: Former Smoker -- 0.25 packs/day for 1 years    Types: Cigarettes  . Smokeless tobacco: Never Used  . Alcohol Use: No  . Drug Use: No  . Sexual Activity: Not Asked   Other Topics Concern  . None   Social History Narrative   Lives in [place].       Works - [employer]   Diet - regular   Exercise - dance 3x per week    Review of Systems  Constitutional: Negative  for fever, chills, appetite change, fatigue and unexpected weight change.  Eyes: Negative for visual disturbance.  Respiratory: Negative for shortness of breath.   Cardiovascular: Negative for chest pain, palpitations and leg swelling.  Gastrointestinal: Negative for nausea, abdominal pain, diarrhea and constipation.  Musculoskeletal: Negative for myalgias and arthralgias.  Skin: Negative for color change and rash.  Hematological: Negative for adenopathy. Does not bruise/bleed easily.  Psychiatric/Behavioral: Positive for sleep disturbance. Negative for dysphoric mood. The patient is not nervous/anxious.        Objective:    BP 142/82 mmHg  Pulse 80  Temp(Src) 98.1 F (36.7 C) (Oral)  Ht 5\' 2"  (1.575 m)  Wt 164 lb 6 oz (74.56 kg)  BMI 30.06 kg/m2  SpO2 98% Physical Exam  Constitutional: She is oriented to person, place, and time. She appears well-developed and well-nourished. No distress.  HENT:  Head: Normocephalic and atraumatic.  Right Ear: External ear normal.  Left Ear: External ear normal.  Nose: Nose normal.  Mouth/Throat: Oropharynx is clear and moist. No oropharyngeal exudate.  Eyes: Conjunctivae are normal. Pupils are equal, round, and reactive to light. Right eye exhibits no discharge. Left eye  exhibits no discharge. No scleral icterus.  Neck: Normal range of motion. Neck supple. No tracheal deviation present. No thyromegaly present.  Cardiovascular: Normal rate, regular rhythm, normal heart sounds and intact distal pulses.  Exam reveals no gallop and no friction rub.   No murmur heard. Pulmonary/Chest: Effort normal and breath sounds normal. No respiratory distress. She has no wheezes. She has no rales. She exhibits no tenderness.  Musculoskeletal: Normal range of motion. She exhibits no edema or tenderness.  Lymphadenopathy:    She has no cervical adenopathy.  Neurological: She is alert and oriented to person, place, and time. No cranial nerve deficit. She exhibits  normal muscle tone. Coordination normal.  Skin: Skin is warm and dry. No rash noted. She is not diaphoretic. No erythema. No pallor.  Psychiatric: She has a normal mood and affect. Her behavior is normal. Judgment and thought content normal.          Assessment & Plan:   Problem List Items Addressed This Visit      Unprioritized   CKD stage 3 due to type 2 diabetes mellitus ([place])    Renal function with labs today.      Diabetes mellitus type 2, controlled ([place]) - Primary    BG generally well controlled. Will check A1c with labs today. She would like to get off Lantus. Discussed possible change to Victoza. However, she would like to wait for now. Will continue Onglyza, Glimepiride and Lantus.       Relevant Orders   Comprehensive metabolic panel   Hemoglobin A1c   Lipid panel   Hypertension    BP Readings from Last 3 Encounters:  05/20/15 142/82  04/01/15 128/68  03/25/15 128/72   BP slightly elevated today, however generally has been well controlled. Will continue to monitor for now. If persistently >140/90, could increase Amlodipine to 10mg  daily and/or consider repeat trial of ARB.      Insomnia    Discussed some options for OTC meds and prescription meds to help with sleep. Also discussed good sleep hygiene. She prefers to hold off on any changes for now. Follow up prn.      Malignant neoplasm of upper-outer quadrant of female breast right, T1,N0,M0. ER/PR pos, Her 2 neg    Reviewed notes from Dr. Jamal Collin. Will continue Femara. Reviewed recent mammogram. Follow up mammogram in 1 year.      Neuropathy due to chemotherapeutic drug ([place])    Symptoms have improved. Will monitor for now.          Return in about 3 months (around 08/20/2015) for Recheck of Diabetes.

## 2015-05-20 NOTE — Assessment & Plan Note (Signed)
Symptoms have improved. Will monitor for now.

## 2015-05-20 NOTE — Assessment & Plan Note (Signed)
Renal function with labs today.  

## 2015-05-20 NOTE — Assessment & Plan Note (Signed)
BP Readings from Last 3 Encounters:  05/20/15 142/82  04/01/15 128/68  03/25/15 128/72   BP slightly elevated today, however generally has been well controlled. Will continue to monitor for now. If persistently >140/90, could increase Amlodipine to 10mg  daily and/or consider repeat trial of ARB.

## 2015-07-02 ENCOUNTER — Other Ambulatory Visit: Payer: Self-pay

## 2015-07-02 MED ORDER — INSULIN GLARGINE 100 UNIT/ML SOLOSTAR PEN: 15.0000 [IU] | PEN_INJECTOR | Freq: Every day | SUBCUTANEOUS | Status: DC

## 2015-07-17 ENCOUNTER — Other Ambulatory Visit: Payer: Self-pay | Admitting: Oncology

## 2015-08-20 ENCOUNTER — Encounter: Payer: Self-pay | Admitting: Internal Medicine

## 2015-08-20 ENCOUNTER — Ambulatory Visit (INDEPENDENT_AMBULATORY_CARE_PROVIDER_SITE_OTHER): Payer: Medicare Other | Admitting: Internal Medicine

## 2015-08-20 VITALS — BP 166/64 | HR 76 | Temp 97.9°F | Ht 61.0 in | Wt 161.2 lb

## 2015-08-20 DIAGNOSIS — E1122 Type 2 diabetes mellitus with diabetic chronic kidney disease: Secondary | ICD-10-CM

## 2015-08-20 DIAGNOSIS — N183 Chronic kidney disease, stage 3 unspecified: Secondary | ICD-10-CM

## 2015-08-20 DIAGNOSIS — Z794 Long term (current) use of insulin: Secondary | ICD-10-CM

## 2015-08-20 DIAGNOSIS — I10 Essential (primary) hypertension: Secondary | ICD-10-CM | POA: Diagnosis not present

## 2015-08-20 LAB — COMPREHENSIVE METABOLIC PANEL
ALBUMIN: 4.5 g/dL (ref 3.5–5.2)
ALT: 18 U/L (ref 0–35)
AST: 16 U/L (ref 0–37)
Alkaline Phosphatase: 79 U/L (ref 39–117)
BUN: 16 mg/dL (ref 6–23)
CHLORIDE: 100 meq/L (ref 96–112)
CO2: 30 mEq/L (ref 19–32)
CREATININE: 1.06 mg/dL (ref 0.40–1.20)
Calcium: 9.6 mg/dL (ref 8.4–10.5)
GFR: 53.8 mL/min — ABNORMAL LOW (ref >60.00)
Glucose, Bld: 160 mg/dL — ABNORMAL HIGH (ref 70–99)
Potassium: 4.4 mEq/L (ref 3.5–5.1)
SODIUM: 136 meq/L (ref 135–145)
TOTAL PROTEIN: 6.3 g/dL (ref 6.0–8.3)
Total Bilirubin: 0.5 mg/dL (ref 0.2–1.2)

## 2015-08-20 LAB — HEMOGLOBIN A1C: Hgb A1c MFr Bld: 7.5 % — ABNORMAL HIGH (ref 4.6–6.5)

## 2015-08-20 MED ORDER — AMLODIPINE BESYLATE 5 MG PO TABS: 5.0000 mg | ORAL_TABLET | Freq: Every day | ORAL | Status: DC

## 2015-08-20 MED ORDER — SAXAGLIPTIN HCL 5 MG PO TABS: 2.5000 mg | ORAL_TABLET | Freq: Every day | ORAL | Status: DC

## 2015-08-20 MED ORDER — CALCIUM CARB-CHOLECALCIFEROL 600-800 MG-UNIT PO TABS: 600.0000 [IU] | ORAL_TABLET | Freq: Every day | ORAL | Status: DC

## 2015-08-20 NOTE — Progress Notes (Signed)
Pre visit review using our clinic review tool, if applicable. No additional management support is needed unless otherwise documented below in the visit note. 

## 2015-08-20 NOTE — Patient Instructions (Signed)
Labs today

## 2015-08-20 NOTE — Progress Notes (Signed)
Subjective:    Patient ID: Kelsey Mason, female    DOB: 10-02-1940, 75 y.o.   MRN: [id number]  HPI  75YO female presents for follow up.  Having some trouble sleeping. Using Tylenol PM on occasion. Goes to bed 6pm to 2-3am. Wakes at 7-9am.   DM - BG 90s-170s max. Compliant with medication. No low BG<70 and no BG>200.  HTN - Out of Amlodipine 7 months. Does not check BP at home.  Wt Readings from Last 3 Encounters:  08/20/15 161 lb 4 oz (73.143 kg)  05/20/15 164 lb 6 oz (74.56 kg)  04/01/15 177 lb (80.287 kg)   BP Readings from Last 3 Encounters:  08/20/15 166/64  05/20/15 142/82  04/01/15 128/68    Past Medical History  Diagnosis Date  . Diabetes mellitus   . Hypertension   . Breast symptom 2014  . Cancer (HCC)     Bilateral Breast, chemo Dr. Jeb Levering   Family History  Problem Relation Age of Onset  . Alcohol abuse Mother   . Heart disease Mother   . Hypertension Mother   . Alcohol abuse Father   . Heart disease Father   . Hypertension Father   . Heart disease Brother    Past Surgical History  Procedure Laterality Date  . Cholecystectomy  2007  . Abdominal hysterectomy  1976    for pelvic pain  . Tumor removal Right 1980    right foot  . Oophorectomy Right   . Breast surgery Bilateral 2014    bilateral lumpectomy and SN biopsy  . Portacath placement  2014   Social History   Social History  . Marital Status: Widowed    Spouse Name: N/A  . Number of Children: N/A  . Years of Education: N/A   Social History Main Topics  . Smoking status: Former Smoker -- 0.25 packs/day for 1 years    Types: Cigarettes  . Smokeless tobacco: Never Used  . Alcohol Use: No  . Drug Use: No  . Sexual Activity: Not Asked   Other Topics Concern  . None   Social History Narrative   Lives in [place].       Works - [employer]   Diet - regular   Exercise - dance 3x per week    Review of Systems  Constitutional: Negative for fever, chills, appetite  change, fatigue and unexpected weight change.  Eyes: Negative for visual disturbance.  Respiratory: Negative for shortness of breath.   Cardiovascular: Negative for chest pain and leg swelling.  Gastrointestinal: Negative for nausea, vomiting, abdominal pain, diarrhea and constipation.  Musculoskeletal: Negative for myalgias and arthralgias.  Skin: Negative for color change and rash.  Hematological: Negative for adenopathy. Does not bruise/bleed easily.  Psychiatric/Behavioral: Negative for sleep disturbance and dysphoric mood. The patient is not nervous/anxious.        Objective:    BP 166/64 mmHg  Pulse 76  Temp(Src) 97.9 F (36.6 C) (Oral)  Ht 5\' 1"  (1.549 m)  Wt 161 lb 4 oz (73.143 kg)  BMI 30.48 kg/m2  SpO2 100% Physical Exam  Constitutional: She is oriented to person, place, and time. She appears well-developed and well-nourished. No distress.  HENT:  Head: Normocephalic and atraumatic.  Right Ear: External ear normal.  Left Ear: External ear normal.  Nose: Nose normal.  Mouth/Throat: Oropharynx is clear and moist. No oropharyngeal exudate.  Eyes: Conjunctivae are normal. Pupils are equal, round, and reactive to light. Right eye exhibits no discharge. Left  eye exhibits no discharge. No scleral icterus.  Neck: Normal range of motion. Neck supple. No tracheal deviation present. No thyromegaly present.  Cardiovascular: Normal rate, regular rhythm, normal heart sounds and intact distal pulses.  Exam reveals no gallop and no friction rub.   No murmur heard. Pulmonary/Chest: Effort normal and breath sounds normal. No respiratory distress. She has no wheezes. She has no rales. She exhibits no tenderness.  Musculoskeletal: Normal range of motion. She exhibits no edema or tenderness.  Lymphadenopathy:    She has no cervical adenopathy.  Neurological: She is alert and oriented to person, place, and time. No cranial nerve deficit. She exhibits normal muscle tone. Coordination normal.   Skin: Skin is warm and dry. No rash noted. She is not diaphoretic. No erythema. No pallor.  Psychiatric: She has a normal mood and affect. Her behavior is normal. Judgment and thought content normal.          Assessment & Plan:   Problem List Items Addressed This Visit      Unprioritized   CKD stage 3 due to type 2 diabetes mellitus ([place])    Will recheck renal function with labs today.      Relevant Medications   saxagliptin HCl (ONGLYZA) 5 MG TABS tablet   Diabetes mellitus type 2, controlled ([place]) - Primary    BG generally well controlled. Will check A1c with labs. Continue current medications.      Relevant Medications   saxagliptin HCl (ONGLYZA) 5 MG TABS tablet   Other Relevant Orders   Comprehensive metabolic panel   Hemoglobin A1c   Hypertension    BP Readings from Last 3 Encounters:  08/20/15 166/64  05/20/15 142/82  04/01/15 128/68   BP elevated however has been off Amlodipine 7 months. Follow up 3 months and sooner as needed.      Relevant Medications   amLODipine (NORVASC) 5 MG tablet       Return in about 3 months (around 11/17/2015) for Recheck of Diabetes.

## 2015-08-20 NOTE — Assessment & Plan Note (Signed)
BG generally well controlled. Will check A1c with labs. Continue current medications.

## 2015-08-20 NOTE — Assessment & Plan Note (Signed)
BP Readings from Last 3 Encounters:  08/20/15 166/64  05/20/15 142/82  04/01/15 128/68   BP elevated however has been off Amlodipine 7 months. Follow up 3 months and sooner as needed.

## 2015-08-20 NOTE — Assessment & Plan Note (Signed)
Will recheck renal function with labs today.

## 2015-09-05 ENCOUNTER — Encounter: Payer: Self-pay | Admitting: Internal Medicine

## 2015-09-05 ENCOUNTER — Ambulatory Visit (INDEPENDENT_AMBULATORY_CARE_PROVIDER_SITE_OTHER): Payer: Medicare Other | Admitting: Internal Medicine

## 2015-09-05 VITALS — BP 165/69 | HR 91 | Temp 99.0°F | Ht 61.0 in | Wt 162.5 lb

## 2015-09-05 DIAGNOSIS — R509 Fever, unspecified: Secondary | ICD-10-CM | POA: Diagnosis not present

## 2015-09-05 DIAGNOSIS — J209 Acute bronchitis, unspecified: Secondary | ICD-10-CM | POA: Diagnosis not present

## 2015-09-05 LAB — POCT INFLUENZA A/B
INFLUENZA B, POC: NEGATIVE
Influenza A, POC: NEGATIVE

## 2015-09-05 MED ORDER — DOXYCYCLINE HYCLATE 100 MG PO TABS: 100.0000 mg | ORAL_TABLET | Freq: Two times a day (BID) | ORAL | Status: DC

## 2015-09-05 NOTE — Progress Notes (Signed)
Pre visit review using our clinic review tool, if applicable. No additional management support is needed unless otherwise documented below in the visit note. 

## 2015-09-05 NOTE — Patient Instructions (Addendum)
Continue rest, adequate fluids.  Use codeine syrup as needed for cough.  Over the weekend, if symptoms persistent, start Doxycycline.  Follow up if symptoms are not improving.

## 2015-09-05 NOTE — Progress Notes (Signed)
Subjective:    Patient ID: Kelsey Mason, female    DOB: September 12, 1940, 75 y.o.   MRN: [id number]  HPI  75YO female presents for acute visit.  Started coughing on Monday. Coughing worse at night. Took some cough medication with codeine with some improvement. No fever at home. No dyspnea. Hoarse. Scratchy throat. Chronic myalgia.  Wt Readings from Last 3 Encounters:  09/05/15 162 lb 8 oz (73.71 kg)  08/20/15 161 lb 4 oz (73.143 kg)  05/20/15 164 lb 6 oz (74.56 kg)   BP Readings from Last 3 Encounters:  09/05/15 165/69  08/20/15 166/64  05/20/15 142/82    Past Medical History  Diagnosis Date  . Diabetes mellitus   . Hypertension   . Breast symptom 2014  . Cancer (HCC)     Bilateral Breast, chemo Dr. Jeb Levering   Family History  Problem Relation Age of Onset  . Alcohol abuse Mother   . Heart disease Mother   . Hypertension Mother   . Alcohol abuse Father   . Heart disease Father   . Hypertension Father   . Heart disease Brother    Past Surgical History  Procedure Laterality Date  . Cholecystectomy  2007  . Abdominal hysterectomy  1976    for pelvic pain  . Tumor removal Right 1980    right foot  . Oophorectomy Right   . Breast surgery Bilateral 2014    bilateral lumpectomy and SN biopsy  . Portacath placement  2014   Social History   Social History  . Marital Status: Widowed    Spouse Name: N/A  . Number of Children: N/A  . Years of Education: N/A   Social History Main Topics  . Smoking status: Former Smoker -- 0.25 packs/day for 1 years    Types: Cigarettes  . Smokeless tobacco: Never Used  . Alcohol Use: No  . Drug Use: No  . Sexual Activity: Not Asked   Other Topics Concern  . None   Social History Narrative   Lives in [place].       Works - [employer]   Diet - regular   Exercise - dance 3x per week    Review of Systems  Constitutional: Positive for fatigue. Negative for fever, chills and unexpected weight change.  HENT:  Positive for congestion, sore throat and voice change. Negative for ear discharge, ear pain, facial swelling, hearing loss, mouth sores, nosebleeds, postnasal drip, rhinorrhea, sinus pressure, sneezing, tinnitus and trouble swallowing.   Eyes: Negative for pain, discharge, redness and visual disturbance.  Respiratory: Positive for cough. Negative for chest tightness, shortness of breath, wheezing and stridor.   Cardiovascular: Negative for chest pain, palpitations and leg swelling.  Musculoskeletal: Negative for myalgias, arthralgias, neck pain and neck stiffness.  Skin: Negative for color change and rash.  Neurological: Negative for dizziness, weakness, light-headedness and headaches.  Hematological: Negative for adenopathy.       Objective:    BP 165/69 mmHg  Pulse 91  Temp(Src) 99 F (37.2 C) (Oral)  Ht 5\' 1"  (1.549 m)  Wt 162 lb 8 oz (73.71 kg)  BMI 30.72 kg/m2  SpO2 97% Physical Exam  Constitutional: She is oriented to person, place, and time. She appears well-developed and well-nourished. No distress.  HENT:  Head: Normocephalic and atraumatic.  Right Ear: External ear normal.  Left Ear: External ear normal.  Nose: Nose normal.  Mouth/Throat: Oropharynx is clear and moist. No oropharyngeal exudate.  Eyes: Conjunctivae are normal. Pupils are  equal, round, and reactive to light. Right eye exhibits no discharge. Left eye exhibits no discharge. No scleral icterus.  Neck: Normal range of motion. Neck supple. No tracheal deviation present. No thyromegaly present.  Cardiovascular: Normal rate, regular rhythm, normal heart sounds and intact distal pulses.  Exam reveals no gallop and no friction rub.   No murmur heard. Pulmonary/Chest: Effort normal and breath sounds normal. No accessory muscle usage. No respiratory distress. She has no decreased breath sounds. She has no wheezes. She has no rhonchi. She has no rales. She exhibits no tenderness.  Musculoskeletal: Normal range of  motion. She exhibits no edema or tenderness.  Lymphadenopathy:    She has no cervical adenopathy.  Neurological: She is alert and oriented to person, place, and time. No cranial nerve deficit. She exhibits normal muscle tone. Coordination normal.  Skin: Skin is warm and dry. No rash noted. She is not diaphoretic. No erythema. No pallor.  Psychiatric: She has a normal mood and affect. Her behavior is normal. Judgment and thought content normal.          Assessment & Plan:   Problem List Items Addressed This Visit      Unprioritized   Acute bronchitis - Primary    Symptoms and exam most c/w viral infection, however she has increased risk for secondary bronchitis. Will monitor over next 24-48hr. If no improvement, add Doxycycline. Continue prn codeine cough syrup. Follow up next week if no improvement.      Relevant Medications   doxycycline (VIBRA-TABS) 100 MG tablet    Other Visit Diagnoses    Fever, unspecified        Relevant Orders    POCT Influenza A/B (Completed)        Return if symptoms worsen or fail to improve.  Ronette Deter, MD Internal Medicine [facility]

## 2015-09-05 NOTE — Assessment & Plan Note (Signed)
Symptoms and exam most c/w viral infection, however she has increased risk for secondary bronchitis. Will monitor over next 24-48hr. If no improvement, add Doxycycline. Continue prn codeine cough syrup. Follow up next week if no improvement.

## 2015-09-09 ENCOUNTER — Inpatient Hospital Stay (HOSPITAL_BASED_OUTPATIENT_CLINIC_OR_DEPARTMENT_OTHER): Payer: Medicare Other | Admitting: Oncology

## 2015-09-09 ENCOUNTER — Inpatient Hospital Stay: Payer: Medicare Other | Attending: Oncology

## 2015-09-09 ENCOUNTER — Encounter: Payer: Self-pay | Admitting: Oncology

## 2015-09-09 VITALS — BP 166/82 | HR 97 | Temp 97.6°F | Resp 18 | Wt 162.5 lb

## 2015-09-09 DIAGNOSIS — Z79811 Long term (current) use of aromatase inhibitors: Secondary | ICD-10-CM | POA: Insufficient documentation

## 2015-09-09 DIAGNOSIS — Z7982 Long term (current) use of aspirin: Secondary | ICD-10-CM | POA: Insufficient documentation

## 2015-09-09 DIAGNOSIS — Z792 Long term (current) use of antibiotics: Secondary | ICD-10-CM

## 2015-09-09 DIAGNOSIS — C50411 Malignant neoplasm of upper-outer quadrant of right female breast: Secondary | ICD-10-CM

## 2015-09-09 DIAGNOSIS — C50911 Malignant neoplasm of unspecified site of right female breast: Secondary | ICD-10-CM

## 2015-09-09 DIAGNOSIS — J019 Acute sinusitis, unspecified: Secondary | ICD-10-CM

## 2015-09-09 DIAGNOSIS — Z79899 Other long term (current) drug therapy: Secondary | ICD-10-CM | POA: Diagnosis not present

## 2015-09-09 DIAGNOSIS — Z9221 Personal history of antineoplastic chemotherapy: Secondary | ICD-10-CM

## 2015-09-09 DIAGNOSIS — Z794 Long term (current) use of insulin: Secondary | ICD-10-CM | POA: Diagnosis not present

## 2015-09-09 DIAGNOSIS — Z923 Personal history of irradiation: Secondary | ICD-10-CM

## 2015-09-09 DIAGNOSIS — Z170 Estrogen receptor positive status [ER+]: Secondary | ICD-10-CM | POA: Insufficient documentation

## 2015-09-09 DIAGNOSIS — C50912 Malignant neoplasm of unspecified site of left female breast: Secondary | ICD-10-CM | POA: Insufficient documentation

## 2015-09-09 DIAGNOSIS — I10 Essential (primary) hypertension: Secondary | ICD-10-CM | POA: Insufficient documentation

## 2015-09-09 DIAGNOSIS — E119 Type 2 diabetes mellitus without complications: Secondary | ICD-10-CM

## 2015-09-09 DIAGNOSIS — Z87891 Personal history of nicotine dependence: Secondary | ICD-10-CM | POA: Diagnosis not present

## 2015-09-09 LAB — CBC WITH DIFFERENTIAL/PLATELET
BASOS ABS: 0.1 10*3/uL (ref 0–0.1)
BASOS PCT: 1 %
EOS ABS: 0.8 10*3/uL — AB (ref 0–0.7)
EOS PCT: 8 %
HCT: 38.2 % (ref 35.0–47.0)
HEMOGLOBIN: 13.5 g/dL (ref 12.0–16.0)
Lymphocytes Relative: 13 %
Lymphs Abs: 1.3 10*3/uL (ref 1.0–3.6)
MCH: 30 pg (ref 26.0–34.0)
MCHC: 35.4 g/dL (ref 32.0–36.0)
MCV: 84.7 fL (ref 80.0–100.0)
Monocytes Absolute: 0.5 10*3/uL (ref 0.2–0.9)
Monocytes Relative: 5 %
NEUTROS PCT: 73 %
Neutro Abs: 7.3 10*3/uL — ABNORMAL HIGH (ref 1.4–6.5)
PLATELETS: 177 10*3/uL (ref 150–440)
RBC: 4.51 MIL/uL (ref 3.80–5.20)
RDW: 13.2 % (ref 11.5–14.5)
WBC: 9.9 10*3/uL (ref 3.6–11.0)

## 2015-09-09 LAB — COMPREHENSIVE METABOLIC PANEL
ALBUMIN: 4.4 g/dL (ref 3.5–5.0)
ALT: 20 U/L (ref 14–54)
AST: 18 U/L (ref 15–41)
Alkaline Phosphatase: 79 U/L (ref 38–126)
Anion gap: 7 (ref 5–15)
BUN: 20 mg/dL (ref 6–20)
CHLORIDE: 98 mmol/L — AB (ref 101–111)
CO2: 27 mmol/L (ref 22–32)
CREATININE: 0.98 mg/dL (ref 0.44–1.00)
Calcium: 9.9 mg/dL (ref 8.9–10.3)
GFR calc non Af Amer: 55 mL/min — ABNORMAL LOW (ref >60)
GLUCOSE: 183 mg/dL — AB (ref 65–99)
Potassium: 3.7 mmol/L (ref 3.5–5.1)
SODIUM: 132 mmol/L — AB (ref 135–145)
Total Bilirubin: 0.6 mg/dL (ref 0.3–1.2)
Total Protein: 6.9 g/dL (ref 6.5–8.1)

## 2015-09-09 MED ORDER — LETROZOLE 2.5 MG PO TABS: 2.5000 mg | ORAL_TABLET | Freq: Every day | ORAL | Status: DC

## 2015-09-09 NOTE — Progress Notes (Signed)
Patient currently has a viral infection.  PCP prescribed doxycycline.

## 2015-09-09 NOTE — Progress Notes (Signed)
[facility] [phone]) [phone]  Fax:(336) [place] OB: 1941/04/01  MR#: [id number]  MIW#:[id number]  Patient Care Team: Jackolyn Confer, MD as PCP - General (Internal Medicine) Christene Lye, MD as Consulting Physician (General Surgery)  CHIEF COMPLAINT:  Chief Complaint  Patient presents with  . Breast Cancer    Chief Complaint/Diagnosis:   75 year old female with bilateral breast cancer Right breast stage I (T1 A. N0 M0) Left breast T1 C. N0 M0 Both ER/PR positive HER-2/neu negative Oncotype DX score is 28 2.chemotherapy with Cytoxan Adriamycin and Neulasta every 3 weeks for 4 times followed by Taxol once a week 80 mg per meter square   12  times  followed by radiation therapyto the both breast 3.chemotherapy is finished on June 05, 2013.  Total   10 treatment of Taxol was given(progressive anemia neuropathy and fatigue) 4.patient  started bilateral breast radiation(January, 201 5) 5.patient  has finished radiation therapy in March of 2015 6.   Started on letrozole from March, 2015   INTERVAL HISTORY:   74 year old lady with a history of carcinoma of breast.  Left breast patient had done high Oncot ype   Dx score and received chemotherapy. Now on letrozole No bony pain no bony fracture.  Patient is going to get mammogram done next week.  At outside institution.  Here for further follow-up and treatment consideration Patient is here for ongoing evaluation and consideration of treatment for bilateral carcinoma of breast. Patient recently had acute sinus infection being treated by primary care physician with antibiotics No bony pain no bony fracture.  Tolerating letrozole very well without any significant problem REVIEW OF SYSTEMS:   GENERAL:  Feels good.  Active.  No fevers, sweats or weight loss. Patient had acute sinus infection from which patient is gradually recovering  PERFORMANCE STATUS (ECOG):  0 HEENT:  No visual  changes, runny nose, sore throat, mouth sores or tenderness. Lungs: No shortness of breath or cough.  No hemoptysis. Cardiac:  No chest pain, palpitations, orthopnea, or PND. GI:  No nausea, vomiting, diarrhea, constipation, melena or hematochezia. GU:  No urgency, frequency, dysuria, or hematuria. Musculoskeletal:  No back pain.  No joint pain.  No muscle tenderness. Extremities:  No pain or swelling. Skin:  No rashes or skin changes. Neuro:  No headache, numbness or weakness, balance or coordination issues. Endocrine:  No diabetes, thyroid issues, hot flashes or night sweats. Psych:  No mood changes, depression or anxiety. Pain:  No focal pain. Review of systems:  All other systems reviewed and found to be negative. As per HPI. Otherwise, a complete review of systems is negatve.  PAST MEDICAL HISTORY: Past Medical History  Diagnosis Date  . Diabetes mellitus   . Hypertension   . Breast symptom 2014  . Cancer (HCC)     Bilateral Breast, chemo Dr. Jeb Levering    PAST SURGICAL HISTORY: Past Surgical History  Procedure Laterality Date  . Cholecystectomy  2007  . Abdominal hysterectomy  1976    for pelvic pain  . Tumor removal Right 1980    right foot  . Oophorectomy Right   . Breast surgery Bilateral 2014    bilateral lumpectomy and SN biopsy  . Portacath placement  2014    FAMILY HISTORY Family History  Problem Relation Age of Onset  . Alcohol abuse Mother   . Heart disease Mother   . Hypertension Mother   . Alcohol abuse Father   . Heart  disease Father   . Hypertension Father   . Heart disease Brother     ADVANCED DIRECTIVES:  Patient does have advance healthcare directive, Patient   does not desire to make any changes HEALTH MAINTENANCE: Social History  Substance Use Topics  . Smoking status: Former Smoker -- 0.25 packs/day for 1 years    Types: Cigarettes  . Smokeless tobacco: Never Used  . Alcohol Use: No      Allergies  Allergen Reactions  . Advil  [Ibuprofen] Other (See Comments)    Dizziness   . Aleve [Naproxen] Other (See Comments)    Dizziness  . Fluticasone-Salmeterol Other (See Comments)    dizziness  . Lipitor [Atorvastatin] Other (See Comments)    Leg pain, muscle ache  . Penicillins Hives    Current Outpatient Prescriptions  Medication Sig Dispense Refill  . amLODipine (NORVASC) 5 MG tablet Take 1 tablet (5 mg total) by mouth daily. 90 tablet 3  . aspirin 81 MG tablet Take 81 mg by mouth daily.    . Beclomethasone Dipropionate (QNASL) 80 MCG/ACT AERS Place 1 spray into the nose daily. 8.7 g 3  . blood glucose meter kit and supplies KIT Dispense based on patient and insurance preference. Use up to four times daily as directed. One Touch Ultra mini meter (FOR ICD-9 250.00, 250.01). Dx: E11.9 1 each 0  . Calcium Carb-Cholecalciferol (SM CALCIUM/VITAMIN D) 600-800 MG-UNIT TABS Take 600-800 Units by mouth daily. 90 tablet 3  . clonazePAM (KLONOPIN) 0.5 MG tablet Take 1 tablet (0.5 mg total) by mouth 2 (two) times daily as needed for anxiety. 60 tablet 3  . doxycycline (VIBRA-TABS) 100 MG tablet Take 1 tablet (100 mg total) by mouth 2 (two) times daily. 20 tablet 0  . glimepiride (AMARYL) 2 MG tablet TAKE 1 TABLET (2 MG TOTAL) BY MOUTH 2 (TWO) TIMES DAILY. 180 tablet 1  . Insulin Glargine (LANTUS) 100 UNIT/ML Solostar Pen Inject 15 Units into the skin daily at 10 pm. 3 mL 3  . Insulin Pen Needle 32G X 6 MM MISC Use as directed with Lantus pen 100 each 3  . letrozole (FEMARA) 2.5 MG tablet TAKE 1 TABLET BY MOUTH EVERY DAY 90 tablet 1  . ONE TOUCH ULTRA TEST test strip USE TO TEST BLOOD SUGAR UP TO 4 TIMES DAILY 100 each 6  . rosuvastatin (CRESTOR) 10 MG tablet TAKE 1 TABLET (10 MG TOTAL) BY MOUTH DAILY. 90 tablet 3  . saxagliptin HCl (ONGLYZA) 5 MG TABS tablet Take 0.5 tablets (2.5 mg total) by mouth daily. 30 tablet 11   No current facility-administered medications for this visit.    OBJECTIVE:  Filed Vitals:   09/09/15  1457  BP: 166/82  Pulse: 97  Temp: 97.6 F (36.4 C)  Resp: 18     Body mass index is 30.72 kg/(m^2).    ECOG FS:0 - Asymptomatic  PHYSICAL EXAM: GENERAL:  Well developed, well nourished, sitting comfortably in the exam room in no acute distress. MENTAL STATUS:  Alert and oriented to person, place and time.   RESPIRATORY:  Clear to auscultation without rales, wheezes or rhonchi. CARDIOVASCULAR:  Regular rate and rhythm without murmur, rub or gallop. BREAST:  Right breast without masses, skin changes or nipple discharge.  Left breast without masses, skin changes or nipple discharge. ABDOMEN:  Soft, non-tender, with active bowel sounds, and no hepatosplenomegaly.  No masses. BACK:  No CVA tenderness.  No tenderness on percussion of the back or rib cage. SKIN:  No rashes, ulcers or lesions. EXTREMITIES: No edema, no skin discoloration or tenderness.  No palpable cords. LYMPH NODES: No palpable cervical, supraclavicular, axillary or inguinal adenopathy  NEUROLOGICAL: Unremarkable. PSYCH:  Appropriate.  LAB RESULTS:  CBC Latest Ref Rng 09/09/2015 03/11/2015  WBC 3.6 - 11.0 K/uL 9.9 7.0  Hemoglobin 12.0 - 16.0 g/dL 13.5 12.8  Hematocrit 35.0 - 47.0 % 38.2 37.5  Platelets 150 - 440 K/uL 177 187    Appointment on 09/09/2015  Component Date Value Ref Range Status  . WBC 09/09/2015 9.9  3.6 - 11.0 K/uL Final  . RBC 09/09/2015 4.51  3.80 - 5.20 MIL/uL Final  . Hemoglobin 09/09/2015 13.5  12.0 - 16.0 g/dL Final  . HCT 09/09/2015 38.2  35.0 - 47.0 % Final  . MCV 09/09/2015 84.7  80.0 - 100.0 fL Final  . MCH 09/09/2015 30.0  26.0 - 34.0 pg Final  . MCHC 09/09/2015 35.4  32.0 - 36.0 g/dL Final  . RDW 09/09/2015 13.2  11.5 - 14.5 % Final  . Platelets 09/09/2015 177  150 - 440 K/uL Final  . Neutrophils Relative % 09/09/2015 73   Final  . Neutro Abs 09/09/2015 7.3* 1.4 - 6.5 K/uL Final  . Lymphocytes Relative 09/09/2015 13   Final  . Lymphs Abs 09/09/2015 1.3  1.0 - 3.6 K/uL Final  .  Monocytes Relative 09/09/2015 5   Final  . Monocytes Absolute 09/09/2015 0.5  0.2 - 0.9 K/uL Final  . Eosinophils Relative 09/09/2015 8   Final  . Eosinophils Absolute 09/09/2015 0.8* 0 - 0.7 K/uL Final  . Basophils Relative 09/09/2015 1   Final  . Basophils Absolute 09/09/2015 0.1  0 - 0.1 K/uL Final  . Sodium 09/09/2015 132* 135 - 145 mmol/L Final  . Potassium 09/09/2015 3.7  3.5 - 5.1 mmol/L Final  . Chloride 09/09/2015 98* 101 - 111 mmol/L Final  . CO2 09/09/2015 27  22 - 32 mmol/L Final  . Glucose, Bld 09/09/2015 183* 65 - 99 mg/dL Final  . BUN 09/09/2015 20  6 - 20 mg/dL Final  . Creatinine, Ser 09/09/2015 0.98  0.44 - 1.00 mg/dL Final  . Calcium 09/09/2015 9.9  8.9 - 10.3 mg/dL Final  . Total Protein 09/09/2015 6.9  6.5 - 8.1 g/dL Final  . Albumin 09/09/2015 4.4  3.5 - 5.0 g/dL Final  . AST 09/09/2015 18  15 - 41 U/L Final  . ALT 09/09/2015 20  14 - 54 U/L Final  . Alkaline Phosphatase 09/09/2015 79  38 - 126 U/L Final  . Total Bilirubin 09/09/2015 0.6  0.3 - 1.2 mg/dL Final  . GFR calc non Af Amer 09/09/2015 55* >60 mL/min Final  . GFR calc Af Amer 09/09/2015 >60  >60 mL/min Final   Comment: (NOTE) The eGFR has been calculated using the CKD EPI equation. This calculation has not been validated in all clinical situations. eGFR's persistently <60 mL/min signify possible Chronic Kidney Disease.   . Anion gap 09/09/2015 7  5 - 15 Final  Office Visit on 09/05/2015  Component Date Value Ref Range Status  . Influenza A, POC 09/05/2015 Negative  Negative Final  . Influenza B, POC 09/05/2015 Negative  Negative Final       STUDIES: Bilateral diagnostic mammogram has been scheduled next week outside institution ASSESSMENT: All lab data has been reviewed.  Patient has bilateral carcinoma of breast Patient in the left with lumpectomy radiation therapy because of high Oncotype DX score patient  Received chemotherapy followed by  letrozole, calcium and vitamin D. Another mammogram  has been scheduled in 6 months along with bone density study. Because of my planned retirement I informed the patient that in 6 months she will be seen by one of my associate      Forest Gleason, MD   09/09/2015 3:24 PM

## 2015-09-10 ENCOUNTER — Encounter: Payer: Self-pay | Admitting: Radiation Oncology

## 2015-09-10 ENCOUNTER — Ambulatory Visit
Admission: RE | Admit: 2015-09-10 | Discharge: 2015-09-10 | Disposition: A | Discharge status: Home / Self Care | Payer: Medicare Other | Source: Ambulatory Visit | Attending: Radiation Oncology | Admitting: Radiation Oncology

## 2015-09-10 VITALS — BP 187/83 | HR 85 | Temp 98.0°F | Resp 20 | Ht 62.5 in | Wt 162.4 lb

## 2015-09-10 DIAGNOSIS — C50411 Malignant neoplasm of upper-outer quadrant of right female breast: Secondary | ICD-10-CM

## 2015-09-10 NOTE — Progress Notes (Signed)
Radiation Oncology Follow up Note  Name: Kelsey Mason   Date:   09/10/2015 MRN:  [id number] DOB: [birth year]    This 75 y.o. female presents to the clinic today for follow-up for stage I breast cancer ER/PR positive HER-2/neu negative status post wide local excision and adjuvant whole breast radiation. Bilaterally  REFERRING PROVIDER: Jackolyn Confer, MD  HPI: Patient is a 75 year old female now out 2 years status post radiation therapy. 2 bilateral breasts for stage I invasive mammary carcinoma. Patient underwent chemotherapy based on high recurrence risk on Oncotype DX. She's currently on letrozole tolerating that well. Her mammograms have been fine. She specifically denies breast tenderness cough or bone pain.  COMPLICATIONS OF TREATMENT: none  FOLLOW UP COMPLIANCE: keeps appointments   PHYSICAL EXAM:  BP 187/83 mmHg  Pulse 85  Temp(Src) 98 F (36.7 C)  Resp 20  Ht 5' 2.5" (1.588 m)  Wt 162 lb 5.9 oz (73.65 kg)  BMI 29.21 kg/m2 Lungs are clear to A&P cardiac examination essentially unremarkable with regular rate and rhythm. No dominant mass or nodularity is noted in either breast in 2 positions examined. Incision is well-healed. No axillary or supraclavicular adenopathy is appreciated. Cosmetic result is excellent. Well-developed well-nourished patient in NAD. HEENT reveals PERLA, EOMI, discs not visualized.  Oral cavity is clear. No oral mucosal lesions are identified. Neck is clear without evidence of cervical or supraclavicular adenopathy. Lungs are clear to A&P. Cardiac examination is essentially unremarkable with regular rate and rhythm without murmur rub or thrill. Abdomen is benign with no organomegaly or masses noted. Motor sensory and DTR levels are equal and symmetric in the upper and lower extremities. Cranial nerves II through XII are grossly intact. Proprioception is intact. No peripheral adenopathy or edema is identified. No motor or sensory levels are noted.  Crude visual fields are within normal range.  RADIOLOGY RESULTS: Bilateral mammograms are reviewed showing no evidence of disease. Mammograms are BI-RADS 2.  PLAN: At the present time she continues to do well with no evidence of disease. I am please were overall progress. I've asked to see her back in 1 year for follow-up. She continues with follow-up mammograms and on letrozole without side effect. Patient knows to call sooner with any concerns.  I would like to take this opportunity for allowing me to participate in the care of your patient.Armstead Peaks., MD

## 2015-10-13 ENCOUNTER — Ambulatory Visit
Admission: RE | Admit: 2015-10-13 | Discharge: 2015-10-13 | Disposition: A | Discharge status: Home / Self Care | Payer: Medicare Other | Source: Ambulatory Visit | Attending: Unknown Physician Specialty | Admitting: Unknown Physician Specialty

## 2015-10-13 ENCOUNTER — Encounter
Admission: RE | Disposition: A | Discharge status: Home / Self Care | Payer: Self-pay | Source: Ambulatory Visit | Attending: Unknown Physician Specialty

## 2015-10-13 ENCOUNTER — Ambulatory Visit: Payer: Medicare Other | Admitting: Anesthesiology

## 2015-10-13 ENCOUNTER — Encounter: Payer: Self-pay | Admitting: *Deleted

## 2015-10-13 DIAGNOSIS — Z794 Long term (current) use of insulin: Secondary | ICD-10-CM | POA: Insufficient documentation

## 2015-10-13 DIAGNOSIS — K621 Rectal polyp: Secondary | ICD-10-CM | POA: Insufficient documentation

## 2015-10-13 DIAGNOSIS — K219 Gastro-esophageal reflux disease without esophagitis: Secondary | ICD-10-CM | POA: Insufficient documentation

## 2015-10-13 DIAGNOSIS — I10 Essential (primary) hypertension: Secondary | ICD-10-CM | POA: Diagnosis not present

## 2015-10-13 DIAGNOSIS — K5730 Diverticulosis of large intestine without perforation or abscess without bleeding: Secondary | ICD-10-CM | POA: Insufficient documentation

## 2015-10-13 DIAGNOSIS — K648 Other hemorrhoids: Secondary | ICD-10-CM | POA: Diagnosis not present

## 2015-10-13 DIAGNOSIS — Z1211 Encounter for screening for malignant neoplasm of colon: Secondary | ICD-10-CM | POA: Insufficient documentation

## 2015-10-13 DIAGNOSIS — D123 Benign neoplasm of transverse colon: Secondary | ICD-10-CM | POA: Diagnosis not present

## 2015-10-13 DIAGNOSIS — Z79899 Other long term (current) drug therapy: Secondary | ICD-10-CM | POA: Insufficient documentation

## 2015-10-13 DIAGNOSIS — Z87891 Personal history of nicotine dependence: Secondary | ICD-10-CM | POA: Insufficient documentation

## 2015-10-13 DIAGNOSIS — Z7982 Long term (current) use of aspirin: Secondary | ICD-10-CM | POA: Diagnosis not present

## 2015-10-13 DIAGNOSIS — E119 Type 2 diabetes mellitus without complications: Secondary | ICD-10-CM | POA: Insufficient documentation

## 2015-10-13 DIAGNOSIS — Z853 Personal history of malignant neoplasm of breast: Secondary | ICD-10-CM | POA: Diagnosis not present

## 2015-10-13 DIAGNOSIS — Z9221 Personal history of antineoplastic chemotherapy: Secondary | ICD-10-CM | POA: Diagnosis not present

## 2015-10-13 HISTORY — PX: COLONOSCOPY WITH PROPOFOL: SHX5780

## 2015-10-13 HISTORY — DX: Gastro-esophageal reflux disease without esophagitis: K21.9

## 2015-10-13 LAB — GLUCOSE, CAPILLARY: GLUCOSE-CAPILLARY: 190 mg/dL — AB (ref 65–99)

## 2015-10-13 SURGERY — COLONOSCOPY WITH PROPOFOL
Anesthesia: General

## 2015-10-13 MED ORDER — SODIUM CHLORIDE 0.9 % IV SOLN: INTRAVENOUS | Status: DC

## 2015-10-13 MED ORDER — SODIUM CHLORIDE 0.9 % IV SOLN
INTRAVENOUS | Status: DC
  Administered 2015-10-13: 1000 mL via INTRAVENOUS

## 2015-10-13 MED ORDER — EPHEDRINE SULFATE 50 MG/ML IJ SOLN
INTRAMUSCULAR | Status: DC | PRN
  Administered 2015-10-13: 5 mg via INTRAVENOUS

## 2015-10-13 MED ORDER — LIDOCAINE HCL (CARDIAC) 20 MG/ML IV SOLN
INTRAVENOUS | Status: DC | PRN
  Administered 2015-10-13: 30 mg via INTRAVENOUS

## 2015-10-13 MED ORDER — PROPOFOL 10 MG/ML IV BOLUS
INTRAVENOUS | Status: DC | PRN
  Administered 2015-10-13: 50 mg via INTRAVENOUS

## 2015-10-13 MED ORDER — MIDAZOLAM HCL 5 MG/5ML IJ SOLN
INTRAMUSCULAR | Status: DC | PRN
  Administered 2015-10-13: 1 mg via INTRAVENOUS

## 2015-10-13 MED ORDER — SODIUM CHLORIDE 0.9 % IV SOLN
INTRAVENOUS | Status: DC | PRN
  Administered 2015-10-13: 08:00:00 via INTRAVENOUS

## 2015-10-13 MED ORDER — PROPOFOL 500 MG/50ML IV EMUL
INTRAVENOUS | Status: DC | PRN
  Administered 2015-10-13: 140 ug/kg/min via INTRAVENOUS

## 2015-10-13 NOTE — Transfer of Care (Signed)
Immediate Anesthesia Transfer of Care Note  Patient: Kelsey Mason  Procedure(s) Performed: Procedure(s): COLONOSCOPY WITH PROPOFOL (N/A)  Patient Location: PACU and Short Stay  Anesthesia Type:General  Level of Consciousness: sedated  Airway & Oxygen Therapy: Patient Spontanous Breathing and Patient connected to nasal cannula oxygen  Post-op Assessment: Report given to RN and Post -op Vital signs reviewed and stable  Post vital signs: Reviewed and stable  Last Vitals:  Filed Vitals:   10/13/15 0702  BP: 162/51  Pulse: 101  Temp: 36.6 C  Resp: 18    Complications: No apparent anesthesia complications

## 2015-10-13 NOTE — Op Note (Signed)
[facility] Patient Name: Kelsey Mason Procedure Date: 10/13/2015 7:31 AM MRN: [id number] Account #: [id number] Date of Birth: March 20, 1941 Admit Type: Outpatient Age: 75 Room: [facility] ENDO ROOM 1 Gender: Female Note Status: Finalized Procedure:            Colonoscopy Indications:          High risk colon cancer surveillance: Personal history                        of colonic polyps Providers:            Manya Silvas, MD Referring MD:         Eduard Clos. Gilford Rile, MD (Referring MD) Medicines:            Propofol per Anesthesia Complications:        No immediate complications. Procedure:            Pre-Anesthesia Assessment:                       - After reviewing the risks and benefits, the patient                        was deemed in satisfactory condition to undergo the                        procedure.                       After obtaining informed consent, the colonoscope was                        passed under direct vision. Throughout the procedure,                        the patient's blood pressure, pulse, and oxygen                        saturations were monitored continuously. The                        Colonoscope was introduced through the anus and                        advanced to the the cecum, identified by appendiceal                        orifice and ileocecal valve. The colonoscopy was                        performed without difficulty. The patient tolerated the                        procedure well. The quality of the bowel preparation                        was excellent. Findings:      A small polyp was found in the hepatic flexure. The polyp was sessile.       The polyp was removed with a hot snare. Resection and retrieval were       complete.      A diminutive polyp was found  in the recto-sigmoid colon. The polyp was       sessile. The polyp was removed with a jumbo cold forceps. Resection and       retrieval were  complete.      Many small-mouthed diverticula were found in the sigmoid colon.      Internal hemorrhoids were found during endoscopy. The hemorrhoids were       small and Grade I (internal hemorrhoids that do not prolapse).      The exam was otherwise without abnormality. Impression:           - One small polyp at the hepatic flexure, removed with                        a hot snare. Resected and retrieved.                       - One diminutive polyp at the recto-sigmoid colon,                        removed with a jumbo cold forceps. Resected and                        retrieved.                       - Diverticulosis in the sigmoid colon.                       - Internal hemorrhoids.                       - The examination was otherwise normal. Recommendation:       - Await pathology results. Manya Silvas, MD 10/13/2015 8:05:27 AM This report has been signed electronically. Number of Addenda: 0 Note Initiated On: 10/13/2015 7:31 AM Scope Withdrawal Time: 0 hours 8 minutes 23 seconds  Total Procedure Duration: 0 hours 20 minutes 43 seconds       [facility]

## 2015-10-13 NOTE — H&P (Signed)
Primary Care Physician:  Rica Mast, MD Primary Gastroenterologist:  Dr. Vira Agar  Pre-Procedure History & Physical: HPI:  Kelsey Mason is a 75 y.o. female is here for an colonoscopy.   Past Medical History  Diagnosis Date  . Diabetes mellitus   . Hypertension   . Breast symptom 2014  . Cancer (HCC)     Bilateral Breast, chemo Dr. Jeb Levering  . Leakage of biological heart valve graft   . GERD (gastroesophageal reflux disease)     mild    Past Surgical History  Procedure Laterality Date  . Cholecystectomy  2007  . Abdominal hysterectomy  1976    for pelvic pain  . Tumor removal Right 1980    right foot  . Oophorectomy Right   . Breast surgery Bilateral 2014    bilateral lumpectomy and SN biopsy  . Portacath placement  2014    Prior to Admission medications   Medication Sig Start Date End Date Taking? Authorizing Provider  amLODipine (NORVASC) 5 MG tablet Take 1 tablet (5 mg total) by mouth daily. 08/20/15  Yes Jackolyn Confer, MD  aspirin 81 MG tablet Take 81 mg by mouth daily.    Historical Provider, MD  Beclomethasone Dipropionate (QNASL) 80 MCG/ACT AERS Place 1 spray into the nose daily. 10/17/13   Jackolyn Confer, MD  blood glucose meter kit and supplies KIT Dispense based on patient and insurance preference. Use up to four times daily as directed. One Touch Ultra mini meter (FOR ICD-9 250.00, 250.01). Dx: E11.9 09/30/14   Jackolyn Confer, MD  Calcium Carb-Cholecalciferol (SM CALCIUM/VITAMIN D) 600-800 MG-UNIT TABS Take 600-800 Units by mouth daily. 08/20/15   Jackolyn Confer, MD  glimepiride (AMARYL) 2 MG tablet TAKE 1 TABLET (2 MG TOTAL) BY MOUTH 2 (TWO) TIMES DAILY. 03/31/15   Jackolyn Confer, MD  Insulin Glargine (LANTUS) 100 UNIT/ML Solostar Pen Inject 15 Units into the skin daily at 10 pm. 07/02/15   Jackolyn Confer, MD  Insulin Pen Needle 32G X 6 MM MISC Use as directed with Lantus pen 02/19/15   Jackolyn Confer, MD  letrozole [facility])  2.5 MG tablet Take 1 tablet (2.5 mg total) by mouth daily. 09/09/15   Forest Gleason, MD  ONE TOUCH ULTRA TEST test strip USE TO TEST BLOOD SUGAR UP TO 4 TIMES DAILY 01/17/15   Jackolyn Confer, MD  rosuvastatin (CRESTOR) 10 MG tablet TAKE 1 TABLET (10 MG TOTAL) BY MOUTH DAILY. 03/25/15   Jackolyn Confer, MD  saxagliptin HCl (ONGLYZA) 5 MG TABS tablet Take 0.5 tablets (2.5 mg total) by mouth daily. 08/20/15   Jackolyn Confer, MD    Allergies as of 10/03/2015 - Review Complete 09/10/2015  Allergen Reaction Noted  . Advil [ibuprofen] Other (See Comments) 12/29/2011  . Aleve [naproxen] Other (See Comments) 12/29/2011  . Fluticasone-salmeterol Other (See Comments) 03/20/2014  . Lipitor [atorvastatin] Other (See Comments) 12/29/2011  . Penicillins Hives 12/29/2011    Family History  Problem Relation Age of Onset  . Alcohol abuse Mother   . Heart disease Mother   . Hypertension Mother   . Alcohol abuse Father   . Heart disease Father   . Hypertension Father   . Heart disease Brother     Social History   Social History  . Marital Status: Widowed    Spouse Name: N/A  . Number of Children: N/A  . Years of Education: N/A   Occupational History  . Not on file.  Social History Main Topics  . Smoking status: Former Smoker -- 0.25 packs/day for 1 years    Types: Cigarettes  . Smokeless tobacco: Never Used  . Alcohol Use: No  . Drug Use: No  . Sexual Activity: Not on file   Other Topics Concern  . Not on file   Social History Narrative   Lives in [place].       Works - [employer]   Diet - regular   Exercise - dance 3x per week    Review of Systems: See HPI, otherwise negative ROS  Physical Exam: BP 162/51 mmHg  Pulse 101  Temp(Src) 97.8 F (36.6 C) (Tympanic)  Resp 18  Ht 5' 2.5" (1.588 m)  Wt 72.576 kg (160 lb)  BMI 28.78 kg/m2  SpO2 99% General:   Alert,  pleasant and cooperative in NAD Head:  Normocephalic and atraumatic. Neck:  Supple; no masses or  thyromegaly. Lungs:  Clear throughout to auscultation.    Heart:  Regular rate and rhythm. Abdomen:  Soft, nontender and nondistended. Normal bowel sounds, without guarding, and without rebound.   Neurologic:  Alert and  oriented x4;  grossly normal neurologically.  Impression/Plan: Kelsey Mason is here for an colonoscopy to be performed for [facility] colon polyps  Risks, benefits, limitations, and alternatives regarding  colonoscopy have been reviewed with the patient.  Questions have been answered.  All parties agreeable.   Gaylyn Cheers, MD  10/13/2015, 7:24 AM

## 2015-10-13 NOTE — Anesthesia Postprocedure Evaluation (Signed)
Anesthesia Post Note  Patient: Kelsey Mason  Procedure(s) Performed: Procedure(s) (LRB): COLONOSCOPY WITH PROPOFOL (N/A)  Patient location during evaluation: Other Anesthesia Type: General Level of consciousness: awake Pain management: pain level controlled Vital Signs Assessment: post-procedure vital signs reviewed and stable Respiratory status: spontaneous breathing Cardiovascular status: blood pressure returned to baseline Postop Assessment: no headache Anesthetic complications: no    Last Vitals:  Filed Vitals:   10/13/15 0821 10/13/15 0831  BP: 100/67 100/86  Pulse: 86 76  Temp:    Resp: 17 15    Last Pain: There were no vitals filed for this visit.               Sunnie Odden M

## 2015-10-13 NOTE — Anesthesia Preprocedure Evaluation (Signed)
Anesthesia Evaluation  Patient identified by MRN, date of birth, ID band Patient awake    Reviewed: Allergy & Precautions, NPO status , Patient's Chart, lab work & pertinent test results  Airway Mallampati: I  TM Distance: >3 FB Neck ROM: Limited    Dental  (+) Teeth Intact   Pulmonary former smoker,    Pulmonary exam normal        Cardiovascular Exercise Tolerance: Poor hypertension, Pt. on medications Normal cardiovascular exam     Neuro/Psych Peripheral neuropathy since chem.  Neuromuscular disease    GI/Hepatic   Endo/Other  Type 2BG 190.  Renal/GU Renal InsufficiencyRenal disease     Musculoskeletal   Abdominal   Peds  Hematology   Anesthesia Other Findings   Reproductive/Obstetrics                             Anesthesia Physical Anesthesia Plan  ASA: III  Anesthesia Plan: General   Post-op Pain Management:    Induction: Intravenous  Airway Management Planned: Nasal Cannula  Additional Equipment:   Intra-op Plan:   Post-operative Plan:   Informed Consent: I have reviewed the patients History and Physical, chart, labs and discussed the procedure including the risks, benefits and alternatives for the proposed anesthesia with the patient or authorized representative who has indicated his/her understanding and acceptance.     Plan Discussed with: CRNA  Anesthesia Plan Comments:         Anesthesia Quick Evaluation

## 2015-10-14 ENCOUNTER — Encounter: Payer: Self-pay | Admitting: Unknown Physician Specialty

## 2015-10-14 LAB — SURGICAL PATHOLOGY

## 2015-11-08 ENCOUNTER — Other Ambulatory Visit: Payer: Self-pay | Admitting: Internal Medicine

## 2015-11-17 ENCOUNTER — Telehealth: Payer: Self-pay

## 2015-11-17 ENCOUNTER — Ambulatory Visit (INDEPENDENT_AMBULATORY_CARE_PROVIDER_SITE_OTHER): Payer: Medicare Other | Admitting: Internal Medicine

## 2015-11-17 ENCOUNTER — Encounter (INDEPENDENT_AMBULATORY_CARE_PROVIDER_SITE_OTHER): Payer: Self-pay

## 2015-11-17 ENCOUNTER — Encounter: Payer: Self-pay | Admitting: Internal Medicine

## 2015-11-17 VITALS — BP 130/70 | HR 73 | Ht 62.0 in | Wt 162.1 lb

## 2015-11-17 DIAGNOSIS — I10 Essential (primary) hypertension: Secondary | ICD-10-CM | POA: Diagnosis not present

## 2015-11-17 DIAGNOSIS — E1122 Type 2 diabetes mellitus with diabetic chronic kidney disease: Secondary | ICD-10-CM

## 2015-11-17 DIAGNOSIS — C50411 Malignant neoplasm of upper-outer quadrant of right female breast: Secondary | ICD-10-CM | POA: Diagnosis not present

## 2015-11-17 DIAGNOSIS — N183 Chronic kidney disease, stage 3 (moderate): Secondary | ICD-10-CM

## 2015-11-17 DIAGNOSIS — E785 Hyperlipidemia, unspecified: Secondary | ICD-10-CM | POA: Diagnosis not present

## 2015-11-17 DIAGNOSIS — Z794 Long term (current) use of insulin: Secondary | ICD-10-CM

## 2015-11-17 LAB — COMPREHENSIVE METABOLIC PANEL
ALBUMIN: 4.5 g/dL (ref 3.5–5.2)
ALK PHOS: 87 U/L (ref 39–117)
ALT: 16 U/L (ref 0–35)
AST: 13 U/L (ref 0–37)
BILIRUBIN TOTAL: 0.5 mg/dL (ref 0.2–1.2)
BUN: 20 mg/dL (ref 6–23)
CALCIUM: 9.8 mg/dL (ref 8.4–10.5)
CO2: 30 mEq/L (ref 19–32)
Chloride: 101 mEq/L (ref 96–112)
Creatinine, Ser: 1.19 mg/dL (ref 0.40–1.20)
GFR: 47.04 mL/min — AB (ref >60.00)
GLUCOSE: 162 mg/dL — AB (ref 70–99)
POTASSIUM: 4.2 meq/L (ref 3.5–5.1)
Sodium: 137 mEq/L (ref 135–145)
TOTAL PROTEIN: 6.3 g/dL (ref 6.0–8.3)

## 2015-11-17 LAB — LIPID PANEL
CHOLESTEROL: 172 mg/dL (ref 0–200)
HDL: 46.7 mg/dL (ref >39.00)
LDL CALC: 103 mg/dL — AB (ref 0–99)
NonHDL: 125.21
TRIGLYCERIDES: 113 mg/dL (ref 0.0–149.0)
Total CHOL/HDL Ratio: 4
VLDL: 22.6 mg/dL (ref 0.0–40.0)

## 2015-11-17 LAB — HEMOGLOBIN A1C: Hgb A1c MFr Bld: 8.5 % — ABNORMAL HIGH (ref 4.6–6.5)

## 2015-11-17 NOTE — Telephone Encounter (Signed)
-----   Message from Jackolyn Confer, MD sent at 11/17/2015 12:50 PM EDT ----- Labs show that blood sugars are more elevated than previous. This may be due to her being off Onglyza for the last week. We can either have her go back on Onglyza and then recheck in 3 months, or, if she is nervous about Onglyza, then stop the medication and increase dose of insulin. Please let me know.

## 2015-11-17 NOTE — Telephone Encounter (Signed)
Left message for patient to call office regarding lab results.

## 2015-11-17 NOTE — Telephone Encounter (Signed)
Spoke with the patient.  She will restart the Onglyza.  Also  FYI she finally got her mychart access fixed!!

## 2015-11-17 NOTE — Progress Notes (Signed)
Subjective:    Patient ID: Kelsey Mason, female    DOB: Mar 08, 1941, 75 y.o.   MRN: [id number]  HPI  75YO female presents for follow up.  DM - BG have been up and down. One day of BG over 200. Compliant with medication except skipped Onglyza for one week.  Aside from this, feeling well. Some joint and muscle pain that has been chronic while on Letrozole. Not taking anything for this. No CP, palpitations.   Wt Readings from Last 3 Encounters:  11/17/15 162 lb 1.9 oz (73.537 kg)  10/13/15 160 lb (72.576 kg)  09/10/15 162 lb 5.9 oz (73.65 kg)   BP Readings from Last 3 Encounters:  11/17/15 130/70  10/13/15 126/69  09/10/15 187/83    Past Medical History  Diagnosis Date  . Diabetes mellitus   . Hypertension   . Breast symptom 2014  . Cancer (HCC)     Bilateral Breast, chemo Dr. Jeb Levering  . Leakage of biological heart valve graft   . GERD (gastroesophageal reflux disease)     mild   Family History  Problem Relation Age of Onset  . Alcohol abuse Mother   . Heart disease Mother   . Hypertension Mother   . Alcohol abuse Father   . Heart disease Father   . Hypertension Father   . Heart disease Brother    Past Surgical History  Procedure Laterality Date  . Cholecystectomy  2007  . Abdominal hysterectomy  1976    for pelvic pain  . Tumor removal Right 1980    right foot  . Oophorectomy Right   . Breast surgery Bilateral 2014    bilateral lumpectomy and SN biopsy  . Portacath placement  2014  . Colonoscopy with propofol N/A 10/13/2015    Procedure: COLONOSCOPY WITH PROPOFOL;  Surgeon: Manya Silvas, MD;  Location: [facility] ENDOSCOPY;  Service: Endoscopy;  Laterality: N/A;   Social History   Social History  . Marital Status: Widowed    Spouse Name: N/A  . Number of Children: N/A  . Years of Education: N/A   Social History Main Topics  . Smoking status: Former Smoker -- 0.25 packs/day for 1 years    Types: Cigarettes  . Smokeless tobacco: Never Used  .  Alcohol Use: No  . Drug Use: No  . Sexual Activity: Not Asked   Other Topics Concern  . None   Social History Narrative   Lives in [place].       Works - [employer]   Diet - regular   Exercise - dance 3x per week    Review of Systems  Constitutional: Negative for fever, chills, appetite change, fatigue and unexpected weight change.  Eyes: Negative for visual disturbance.  Respiratory: Negative for cough and shortness of breath.   Cardiovascular: Negative for chest pain and leg swelling.  Gastrointestinal: Negative for nausea, vomiting, abdominal pain, diarrhea and constipation.  Musculoskeletal: Positive for myalgias and arthralgias.  Skin: Negative for color change and rash.  Neurological: Negative for weakness.  Hematological: Negative for adenopathy. Does not bruise/bleed easily.  Psychiatric/Behavioral: Negative for suicidal ideas, sleep disturbance and dysphoric mood. The patient is not nervous/anxious.        Objective:    BP 130/70 mmHg  Pulse 73  Ht 5\' 2"  (1.575 m)  Wt 162 lb 1.9 oz (73.537 kg)  BMI 29.64 kg/m2  SpO2 93% Physical Exam  Constitutional: She is oriented to person, place, and time. She appears well-developed and well-nourished.  No distress.  HENT:  Head: Normocephalic and atraumatic.  Right Ear: External ear normal.  Left Ear: External ear normal.  Nose: Nose normal.  Mouth/Throat: Oropharynx is clear and moist. No oropharyngeal exudate.  Eyes: Conjunctivae are normal. Pupils are equal, round, and reactive to light. Right eye exhibits no discharge. Left eye exhibits no discharge. No scleral icterus.  Neck: Normal range of motion. Neck supple. No tracheal deviation present. No thyromegaly present.  Cardiovascular: Normal rate, regular rhythm, normal heart sounds and intact distal pulses.  Exam reveals no gallop and no friction rub.   No murmur heard. Pulmonary/Chest: Effort normal and breath sounds normal. No respiratory distress. She has no  wheezes. She has no rales. She exhibits no tenderness.  Musculoskeletal: Normal range of motion. She exhibits no edema or tenderness.  Lymphadenopathy:    She has no cervical adenopathy.  Neurological: She is alert and oriented to person, place, and time. No cranial nerve deficit. She exhibits normal muscle tone. Coordination normal.  Skin: Skin is warm and dry. No rash noted. She is not diaphoretic. No erythema. No pallor.  Psychiatric: She has a normal mood and affect. Her behavior is normal. Judgment and thought content normal.          Assessment & Plan:   Problem List Items Addressed This Visit      Unprioritized   Diabetes mellitus type 2, controlled ([place]) - Primary    BG variable after recent colonoscopy. Discussed risks and benefits of current medications. Will continue Glimepiride and Onglyza. Will continue Lantus 15units daily. Recheck A1c today.      Relevant Orders   Comprehensive metabolic panel   Hemoglobin A1c   Hyperlipidemia    Will check lipids with labs. Continue Crestor.      Relevant Orders   Lipid panel   Hypertension    BP Readings from Last 3 Encounters:  11/17/15 130/70  10/13/15 126/69  09/10/15 187/83   BP well controlled. Renal function with labs.      Malignant neoplasm of upper-outer quadrant of female breast right, T1,N0,M0. ER/PR pos, Her 2 neg    Reviewed recent rad onc notes. Continue Letrozole.          Return in about 3 months (around 02/17/2016) for Recheck of Diabetes.  Ronette Deter, MD Internal Medicine [facility]

## 2015-11-17 NOTE — Patient Instructions (Signed)
Labs today.   Follow up in 3 months.  

## 2015-11-17 NOTE — Progress Notes (Signed)
Pre visit review using our clinic review tool, if applicable. No additional management support is needed unless otherwise documented below in the visit note. 

## 2015-11-17 NOTE — Telephone Encounter (Addendum)
Patient requested a call [phone]

## 2015-11-17 NOTE — Assessment & Plan Note (Signed)
BP Readings from Last 3 Encounters:  11/17/15 130/70  10/13/15 126/69  09/10/15 187/83   BP well controlled. Renal function with labs.

## 2015-11-17 NOTE — Assessment & Plan Note (Signed)
Reviewed recent rad onc notes. Continue Letrozole.

## 2015-11-17 NOTE — Assessment & Plan Note (Signed)
Will check lipids with labs. Continue Crestor.

## 2015-11-17 NOTE — Assessment & Plan Note (Signed)
BG variable after recent colonoscopy. Discussed risks and benefits of current medications. Will continue Glimepiride and Onglyza. Will continue Lantus 15units daily. Recheck A1c today.

## 2015-11-18 ENCOUNTER — Telehealth: Payer: Self-pay

## 2015-11-18 NOTE — Telephone Encounter (Signed)
-----   Message from Jackolyn Confer, MD sent at 11/17/2015 12:50 PM EDT ----- Labs show that blood sugars are more elevated than previous. This may be due to her being off Onglyza for the last week. We can either have her go back on Onglyza and then recheck in 3 months, or, if she is nervous about Onglyza, then stop the medication and increase dose of insulin. Please let me know.

## 2015-11-18 NOTE — Telephone Encounter (Signed)
Left message for patient to call office regarding labs and recommendations.

## 2015-12-13 ENCOUNTER — Telehealth: Payer: Self-pay

## 2015-12-13 NOTE — Telephone Encounter (Signed)
Patient is on the list for [employer] 2017 and may be a good candidate for an AWV in 2017. Please let me know if/when appt is scheduled.   -actively seeing oncology for breast cancer.

## 2015-12-15 NOTE — Telephone Encounter (Signed)
Thank you.  Will follow as appropriate. 

## 2016-01-09 NOTE — Telephone Encounter (Signed)
Appointment scheduled after follow up with PCP 01/30/16.

## 2016-01-30 ENCOUNTER — Encounter: Payer: Self-pay | Admitting: Internal Medicine

## 2016-01-30 ENCOUNTER — Ambulatory Visit (INDEPENDENT_AMBULATORY_CARE_PROVIDER_SITE_OTHER): Payer: Medicare Other | Admitting: Internal Medicine

## 2016-01-30 VITALS — BP 136/68 | HR 80 | Ht 62.0 in | Wt 165.0 lb

## 2016-01-30 DIAGNOSIS — N183 Chronic kidney disease, stage 3 unspecified: Secondary | ICD-10-CM

## 2016-01-30 DIAGNOSIS — E1122 Type 2 diabetes mellitus with diabetic chronic kidney disease: Secondary | ICD-10-CM

## 2016-01-30 DIAGNOSIS — E785 Hyperlipidemia, unspecified: Secondary | ICD-10-CM | POA: Diagnosis not present

## 2016-01-30 DIAGNOSIS — I5022 Chronic systolic (congestive) heart failure: Secondary | ICD-10-CM | POA: Insufficient documentation

## 2016-01-30 DIAGNOSIS — Z0000 Encounter for general adult medical examination without abnormal findings: Secondary | ICD-10-CM

## 2016-01-30 DIAGNOSIS — I10 Essential (primary) hypertension: Secondary | ICD-10-CM | POA: Diagnosis not present

## 2016-01-30 DIAGNOSIS — Z794 Long term (current) use of insulin: Secondary | ICD-10-CM

## 2016-01-30 MED ORDER — INSULIN GLARGINE 100 UNIT/ML SOLOSTAR PEN: 20.0000 [IU] | PEN_INJECTOR | Freq: Every day | SUBCUTANEOUS | 3 refills | Status: DC

## 2016-01-30 NOTE — Progress Notes (Signed)
Subjective:   Kelsey Mason is a 75 y.o. female who presents for Medicare Annual (Subsequent) preventive examination.  Review of Systems:  No ROS.  Medicare Wellness Visit.  Cardiac Risk Factors include: advanced age (>29mn, >>27women);diabetes mellitus     Objective:     Vitals: BP 136/68 (BP Location: Left Arm, Patient Position: Sitting, Cuff Size: Large)   Pulse 80   Ht '5\' 2"'  (1.575 m)   Wt 165 lb (74.8 kg)   SpO2 98%   BMI 30.18 kg/m   Body mass index is 30.18 kg/m.   Tobacco History  Smoking Status  . Former Smoker  . Packs/day: 0.25  . Years: 1.00  . Types: Cigarettes  Smokeless Tobacco  . Never Used     Counseling given: Not Answered   Past Medical History:  Diagnosis Date  . Breast symptom 2014  . Cancer (HCC)    Bilateral Breast, chemo Dr. CJeb Levering . Diabetes mellitus   . GERD (gastroesophageal reflux disease)    mild  . Hypertension   . Leakage of biological heart valve graft    Past Surgical History:  Procedure Laterality Date  . ABDOMINAL HYSTERECTOMY  1976   for pelvic pain  . BREAST SURGERY Bilateral 2014   bilateral lumpectomy and SN biopsy  . CHOLECYSTECTOMY  2007  . COLONOSCOPY WITH PROPOFOL N/A 10/13/2015   Procedure: COLONOSCOPY WITH PROPOFOL;  Surgeon: RManya Silvas MD;  [facility];  Service: Endoscopy;  Laterality: N/A;  . OOPHORECTOMY Right   . PORTACATH PLACEMENT  2014  . TUMOR REMOVAL Right 1980   right foot   Family History  Problem Relation Age of Onset  . Alcohol abuse Mother   . Heart disease Mother   . Hypertension Mother   . Alcohol abuse Father   . Heart disease Father   . Hypertension Father   . Heart disease Brother    History  Sexual Activity  . Sexual activity: No    Outpatient Encounter Prescriptions as of 01/30/2016  Medication Sig  . valsartan (DIOVAN) 80 MG tablet Take by mouth.  .Marland KitchenamLODipine (NORVASC) 5 MG tablet Take 1 tablet (5 mg total) by mouth daily.  .Marland Kitchenaspirin 81  MG tablet Take 81 mg by mouth daily.  . blood glucose meter kit and supplies KIT Dispense based on patient and insurance preference. Use up to four times daily as directed. One Touch Ultra mini meter (FOR ICD-9 250.00, 250.01). Dx: E11.9  . Calcium Carb-Cholecalciferol (SM CALCIUM/VITAMIN D) 600-800 MG-UNIT TABS Take 600-800 Units by mouth daily.  .Marland Kitchenglimepiride (AMARYL) 2 MG tablet TAKE 1 TABLET (2 MG TOTAL) BY MOUTH 2 (TWO) TIMES DAILY.  .Marland KitchenInsulin Glargine (LANTUS) 100 UNIT/ML Solostar Pen Inject 20 Units into the skin daily at 10 pm.  . Insulin Pen Needle 32G X 6 MM MISC Use as directed with Lantus pen  . letrozole (FEMARA) 2.5 MG tablet Take 1 tablet (2.5 mg total) by mouth daily.  . ONE TOUCH ULTRA TEST test strip USE TO TEST BLOOD SUGAR UP TO 4 TIMES DAILY  . rosuvastatin (CRESTOR) 10 MG tablet TAKE 1 TABLET (10 MG TOTAL) BY MOUTH DAILY.  . saxagliptin HCl (ONGLYZA) 5 MG TABS tablet Take 0.5 tablets (2.5 mg total) by mouth daily.  . [DISCONTINUED] Insulin Glargine (LANTUS) 100 UNIT/ML Solostar Pen Inject 15 Units into the skin daily at 10 pm.   No facility-administered encounter medications on file as of 01/30/2016.     Activities of  Daily Living In your present state of health, do you have any difficulty performing the following activities: 01/30/2016 11/17/2015  Hearing? N N  Vision? N N  Difficulty concentrating or making decisions? N N  Walking or climbing stairs? N N  Dressing or bathing? N N  Doing errands, shopping? N N  Preparing Food and eating ? N -  Using the Toilet? N -  In the past six months, have you accidently leaked urine? N -  Do you have problems with loss of bowel control? N -  Managing your Medications? N -  Managing your Finances? N -  Housekeeping or managing your Housekeeping? N -  Some recent data might be hidden    Patient Care Team: Jackolyn Confer, MD as PCP - General (Internal Medicine) Seeplaputhur Robinette Haines, MD as Consulting Physician (General  Surgery)    Assessment:    This is a routine wellness examination for [place]. The goal of the wellness visit is to assist the patient how to close the gaps in care and create a preventative care plan for the patient.   Taking calcium VIT D as appropriate/Osteoporosis risk reviewed.  Medications reviewed; taking without issues or barriers.  Safety issues reviewed; smoke and carbon monoxide detectors in the home. Firearms locked in a secure area in the home. Wears seatbelts when driving or riding with others. No violence in the home.  No identified risk were noted; The patient was oriented x 3; appropriate in dress and manner and no objective failures at ADL's or IADL's.   Body mass index; discussed the importance of a healthy diet, water intake and exercise. Educational material provided.  Prevnar 13 vaccine deferred, per patient request.  ZOSTAVAX vaccine postponed for follow up with insurance.  Patient Concerns: None at this time. Follow up with PCP as needed.  Exercise Activities and Dietary recommendations Current Exercise Habits: Structured exercise class, Time (Minutes): > 60, Frequency (Times/Week): 3, Weekly Exercise (Minutes/Week): 0, Intensity: Intense  Goals    . Healhty Lifestyle          Maintain exercise regiment of dancing  2-3 days a week for 3 hours. Stay hydrated and drink plenty of water. Low carb foods. Educational material provided.      Fall Risk Fall Risk  01/30/2016 11/17/2015 09/10/2015 03/04/2015 05/15/2014  Falls in the past year? No No No No No   Depression Screen PHQ 2/9 Scores 01/30/2016 11/17/2015 09/10/2015 03/04/2015  PHQ - 2 Score 0 0 0 0     Cognitive Testing MMSE - Mini Mental State Exam 01/30/2016  Orientation to time 5  Orientation to Place 5  Registration 3  Attention/ Calculation 5  Recall 3  Language- name 2 objects 2  Language- repeat 1  Language- follow 3 step command 3  Language- read & follow direction 1  Write a  sentence 1  Copy design 1  Total score 30    Immunization History  Administered Date(s) Administered  . Influenza Whole 03/26/2012, 04/10/2013  . Influenza,inj,Quad PF,36+ Mos 05/15/2014, 03/25/2015   Screening Tests Health Maintenance  Topic Date Due  . ZOSTAVAX  03/13/2001  . DEXA SCAN  03/13/2006  . PNA vac Low Risk Adult (1 of 2 - PCV13) 03/13/2006  . OPHTHALMOLOGY EXAM  01/13/2015  . TETANUS/TDAP  12/28/2020 (Originally 03/13/1960)  . INFLUENZA VACCINE  02/03/2016  . FOOT EXAM  02/14/2016  . URINE MICROALBUMIN  02/14/2016  . HEMOGLOBIN A1C  05/19/2016  . MAMMOGRAM  01/21/2017  . COLONOSCOPY  10/12/2025      Plan:   End of life planning; Advance aging; Advanced directives discussed. Copy of current HCPOA/Living Will on file.  During the course of the visit the patient was educated and counseled about the following appropriate screening and preventive services:   Vaccines to include Pneumoccal, Influenza, Hepatitis B, Td, Zostavax, HCV  Electrocardiogram  Cardiovascular Disease  Colorectal cancer screening  Bone density screening  Diabetes screening  Glaucoma screening  Mammography/PAP  Nutrition counseling   Patient Instructions (the written plan) was given to the patient.   Varney Biles, LPN  08/26/4112

## 2016-01-30 NOTE — Assessment & Plan Note (Signed)
Last ECHO 2015. Scheduled for repeat ECHO with Dr. Nehemiah Massed this month. Symptomatically, doing well. Some fatigue, however no dyspnea with dancing and other activities.

## 2016-01-30 NOTE — Assessment & Plan Note (Signed)
BG elevated. Will increase Lantus to 20units. She will monitor closely and call if BG<70. Consider stopping Glimepiride at follow up. A1c in 02/2016.

## 2016-01-30 NOTE — Assessment & Plan Note (Addendum)
BP Readings from Last 3 Encounters:  01/30/16 136/68  11/17/15 130/70  10/13/15 126/69   BP well controlled. Valsartan started by Cardiology. Will repeat renal function in 02/2016.

## 2016-01-30 NOTE — Assessment & Plan Note (Signed)
Lipids with labs in 02/2016. Continue Rosuvastatin.

## 2016-01-30 NOTE — Progress Notes (Signed)
Subjective:    Patient ID: Kelsey Mason, female    DOB: Feb 13, 1941, 75 y.o.   MRN: [id number]  HPI  75YO female presents for follow up.  Seen by cardiology yesterday. Noted to be more short of breath and fatigued. Scheduled for ECHO. Added Valsartan because of elevated BP. BP was higher yesterday. She denies dyspnea today. Just feels more fatigued ever since chemotherapy.  DM - BG at home up and down. Few over 200. Compliant with Lantus.   Lab Results  Component Value Date   HGBA1C 8.5 (H) 11/17/2015     Wt Readings from Last 3 Encounters:  01/30/16 165 lb (74.8 kg)  11/17/15 162 lb 1.9 oz (73.5 kg)  10/13/15 160 lb (72.6 kg)   BP Readings from Last 3 Encounters:  01/30/16 136/68  11/17/15 130/70  10/13/15 126/69    Past Medical History:  Diagnosis Date  . Breast symptom 2014  . Cancer (HCC)    Bilateral Breast, chemo Dr. Jeb Levering  . Diabetes mellitus   . GERD (gastroesophageal reflux disease)    mild  . Hypertension   . Leakage of biological heart valve graft    Family History  Problem Relation Age of Onset  . Alcohol abuse Mother   . Heart disease Mother   . Hypertension Mother   . Alcohol abuse Father   . Heart disease Father   . Hypertension Father   . Heart disease Brother    Past Surgical History:  Procedure Laterality Date  . ABDOMINAL HYSTERECTOMY  1976   for pelvic pain  . BREAST SURGERY Bilateral 2014   bilateral lumpectomy and SN biopsy  . CHOLECYSTECTOMY  2007  . COLONOSCOPY WITH PROPOFOL N/A 10/13/2015   Procedure: COLONOSCOPY WITH PROPOFOL;  Surgeon: Manya Silvas, MD;  Location: [facility] ENDOSCOPY;  Service: Endoscopy;  Laterality: N/A;  . OOPHORECTOMY Right   . PORTACATH PLACEMENT  2014  . TUMOR REMOVAL Right 1980   right foot   Social History   Social History  . Marital status: Widowed    Spouse name: N/A  . Number of children: N/A  . Years of education: N/A   Social History Main Topics  . Smoking status: Former Smoker     Packs/day: 0.25    Years: 1.00    Types: Cigarettes  . Smokeless tobacco: Never Used  . Alcohol use No  . Drug use: No  . Sexual activity: Not Asked   Other Topics Concern  . None   Social History Narrative   Lives in [place].       Works - [employer]   Diet - regular   Exercise - dance 3x per week    Review of Systems  Constitutional: Positive for fatigue. Negative for appetite change, chills, fever and unexpected weight change.  Eyes: Negative for visual disturbance.  Respiratory: Negative for cough, chest tightness and shortness of breath.   Cardiovascular: Negative for chest pain, palpitations and leg swelling.  Gastrointestinal: Negative for abdominal pain, constipation, diarrhea, nausea and vomiting.  Skin: Negative for color change and rash.  Neurological: Negative for weakness.  Hematological: Negative for adenopathy. Does not bruise/bleed easily.  Psychiatric/Behavioral: Negative for dysphoric mood. The patient is not nervous/anxious.        Objective:    BP 136/68 (BP Location: Left Arm, Patient Position: Sitting, Cuff Size: Large)   Pulse 80   Ht 5\' 2"  (1.575 m)   Wt 165 lb (74.8 kg)   SpO2 98%  BMI 30.18 kg/m  Physical Exam  Constitutional: She is oriented to person, place, and time. She appears well-developed and well-nourished. No distress.  HENT:  Head: Normocephalic and atraumatic.  Right Ear: External ear normal.  Left Ear: External ear normal.  Nose: Nose normal.  Mouth/Throat: Oropharynx is clear and moist. No oropharyngeal exudate.  Eyes: Conjunctivae are normal. Pupils are equal, round, and reactive to light. Right eye exhibits no discharge. Left eye exhibits no discharge. No scleral icterus.  Neck: Normal range of motion. Neck supple. No tracheal deviation present. No thyromegaly present.  Cardiovascular: Normal rate, regular rhythm, normal heart sounds and intact distal pulses.  Exam reveals no gallop and no friction rub.   No  murmur heard. Pulmonary/Chest: Effort normal and breath sounds normal. No accessory muscle usage. No tachypnea. No respiratory distress. She has no decreased breath sounds. She has no wheezes. She has no rhonchi. She has no rales. She exhibits no tenderness.  Musculoskeletal: Normal range of motion. She exhibits no edema or tenderness.  Lymphadenopathy:    She has no cervical adenopathy.  Neurological: She is alert and oriented to person, place, and time. No cranial nerve deficit. She exhibits normal muscle tone. Coordination normal.  Skin: Skin is warm and dry. No rash noted. She is not diaphoretic. No erythema. No pallor.  Psychiatric: She has a normal mood and affect. Her behavior is normal. Judgment and thought content normal.          Assessment & Plan:   Problem List Items Addressed This Visit      Unprioritized   Chronic systolic (congestive) heart failure ([place]) (Chronic)    Last ECHO 2015. Scheduled for repeat ECHO with Dr. Nehemiah Massed this month. Symptomatically, doing well. Some fatigue, however no dyspnea with dancing and other activities.      Relevant Medications   valsartan (DIOVAN) 80 MG tablet   Diabetes mellitus type 2, controlled (HCC) - Primary (Chronic)    BG elevated. Will increase Lantus to 20units. She will monitor closely and call if BG<70. Consider stopping Glimepiride at follow up. A1c in 02/2016.      Relevant Medications   valsartan (DIOVAN) 80 MG tablet   Insulin Glargine (LANTUS) 100 UNIT/ML Solostar Pen   Other Relevant Orders   Comprehensive metabolic panel   Hemoglobin A1c   Lipid panel   Microalbumin / creatinine urine ratio   Hyperlipidemia (Chronic)    Lipids with labs in 02/2016. Continue Rosuvastatin.      Relevant Medications   valsartan (DIOVAN) 80 MG tablet   Hypertension (Chronic)    BP Readings from Last 3 Encounters:  01/30/16 136/68  11/17/15 130/70  10/13/15 126/69   BP well controlled. Valsartan started by Cardiology. Will  repeat renal function in 02/2016.      Relevant Medications   valsartan (DIOVAN) 80 MG tablet    Other Visit Diagnoses   None.      Return in about 4 weeks (around 02/27/2016) for New Patient.  Ronette Deter, MD Internal Medicine [facility] Annual Wellness Visit as completed by Health Coach was reviewed in full.

## 2016-01-30 NOTE — Progress Notes (Signed)
Pre visit review using our clinic review tool, if applicable. No additional management support is needed unless otherwise documented below in the visit note. 

## 2016-01-30 NOTE — Patient Instructions (Addendum)
  Kelsey Mason , Thank you for taking time to come for your Medicare Wellness Visit. I appreciate your ongoing commitment to your health goals. Please review the following plan we discussed and let me know if I can assist you in the future.     This is a list of the screening recommended for you and due dates:  Health Maintenance  Topic Date Due  . Shingles Vaccine  03/13/2001  . DEXA scan (bone density measurement)  03/13/2006  . Pneumonia vaccines (1 of 2 - PCV13) 03/13/2006  . Eye exam for diabetics  01/13/2015  . Tetanus Vaccine  12/28/2020*  . Flu Shot  02/03/2016  . Complete foot exam   02/14/2016  . Urine Protein Check  02/14/2016  . Hemoglobin A1C  05/19/2016  . Mammogram  01/21/2017  . Colon Cancer Screening  10/12/2025  *Topic was postponed. The date shown is not the original due date.      Labs after August 15th, then new patient examination with Dr. Lacinda Axon.  Increase Lantus to 20 units daily.  Follow up in 4 weeks.

## 2016-02-09 ENCOUNTER — Telehealth: Payer: Self-pay

## 2016-02-09 NOTE — Telephone Encounter (Signed)
-----   Message from Lequita Asal, MD sent at 02/09/2016 10:55 AM EDT ----- Regarding: FW: Imaging Order  Please assist in order at new facility.  M  ----- Message ----- From: Wallene Dales Sent: 02/09/2016  10:27 AM To: Lequita Asal, MD, Luella Cook, RN Subject: Imaging Order                                  Good Morning,  Please call pt. Her previous imaging center is no longer providing mammography imaging and she needs a new order to go to a different facility.   Thank you

## 2016-02-09 NOTE — Telephone Encounter (Signed)
Called patient and left her a message to call us back so we can ask her about where she would like to get her mammograms

## 2016-02-11 ENCOUNTER — Other Ambulatory Visit: Payer: Self-pay | Admitting: General Surgery

## 2016-02-11 DIAGNOSIS — C50411 Malignant neoplasm of upper-outer quadrant of right female breast: Secondary | ICD-10-CM

## 2016-02-11 DIAGNOSIS — C50312 Malignant neoplasm of lower-inner quadrant of left female breast: Secondary | ICD-10-CM

## 2016-02-16 ENCOUNTER — Other Ambulatory Visit: Payer: Self-pay | Admitting: *Deleted

## 2016-02-16 ENCOUNTER — Inpatient Hospital Stay
Admission: RE | Admit: 2016-02-16 | Discharge: 2016-02-16 | Disposition: A | Discharge status: Home / Self Care | Payer: Self-pay | Source: Ambulatory Visit | Attending: *Deleted | Admitting: *Deleted

## 2016-02-16 DIAGNOSIS — Z9289 Personal history of other medical treatment: Secondary | ICD-10-CM

## 2016-02-17 ENCOUNTER — Encounter: Payer: Self-pay | Admitting: *Deleted

## 2016-02-23 ENCOUNTER — Telehealth: Payer: Self-pay | Admitting: *Deleted

## 2016-02-23 NOTE — Telephone Encounter (Signed)
Spoke with pt on Friday when she wanted to have bone denisty on same day as her mammogram and I told her I would have to check on it and call her back today. I called her and got voicemail and left mess. She had on 03/11/15 and it was normal and she would not need another one for another year or two.  She can call if she has questions.

## 2016-02-27 ENCOUNTER — Encounter: Payer: Self-pay | Admitting: Family Medicine

## 2016-02-27 ENCOUNTER — Ambulatory Visit (INDEPENDENT_AMBULATORY_CARE_PROVIDER_SITE_OTHER): Payer: Medicare Other | Admitting: Family Medicine

## 2016-02-27 VITALS — BP 138/62 | HR 80 | Temp 97.9°F | Resp 16 | Ht 62.5 in | Wt 165.0 lb

## 2016-02-27 DIAGNOSIS — Z794 Long term (current) use of insulin: Secondary | ICD-10-CM

## 2016-02-27 DIAGNOSIS — N183 Chronic kidney disease, stage 3 unspecified: Secondary | ICD-10-CM

## 2016-02-27 DIAGNOSIS — E785 Hyperlipidemia, unspecified: Secondary | ICD-10-CM | POA: Diagnosis not present

## 2016-02-27 DIAGNOSIS — I10 Essential (primary) hypertension: Secondary | ICD-10-CM | POA: Diagnosis not present

## 2016-02-27 DIAGNOSIS — E1122 Type 2 diabetes mellitus with diabetic chronic kidney disease: Secondary | ICD-10-CM | POA: Insufficient documentation

## 2016-02-27 LAB — LIPID PANEL
Cholesterol: 158 mg/dL (ref 0–200)
HDL: 55.5 mg/dL (ref >39.00)
LDL CALC: 70 mg/dL (ref 0–99)
NONHDL: 102.59
Total CHOL/HDL Ratio: 3
Triglycerides: 161 mg/dL — ABNORMAL HIGH (ref 0.0–149.0)
VLDL: 32.2 mg/dL (ref 0.0–40.0)

## 2016-02-27 LAB — HEMOGLOBIN A1C: Hgb A1c MFr Bld: 8.6 % — ABNORMAL HIGH (ref 4.6–6.5)

## 2016-02-27 NOTE — Patient Instructions (Signed)
Increase your Lantus by 1 unit daily until your fasting sugars are 100-120 (consistently).  Continue your other meds.  Follow up in 3 months.  Take care  Dr. Lacinda Axon

## 2016-02-27 NOTE — Progress Notes (Signed)
Subjective:  Patient ID: Kelsey Mason, female    DOB: 16-Jan-1941  Age: 75 y.o. MRN: [id number]  CC: Follow up  HPI:  75 year old female with a past medical history of DM 2, hypertension, hyperlipidemia, systolic heart failure, bilateral breast cancer on letrozole, CKD 3 presents for follow up.  DM2  Blood sugars readings - Uncontrolled. 200's. Fasting this am was 184.  Hypoglycemia - No.  Medications - Lantus 20 units nightly, Onglyza 5 mg daily, Glipizide 2 mg BID.  Adverse effects - No.  Compliance - Yes. Preventative care - Up to date.  HTN  Stable on Diovan and Norvasc.  HLD  Most recent lipid panel revealed an elevated LDL of 103. Not at goal. Her prior 7 well-controlled.  She is on Crestor 10 mg daily.  We'll discuss this today.  Social Hx   Social History   Social History  . Marital status: Widowed    Spouse name: N/A  . Number of children: N/A  . Years of education: N/A   Social History Main Topics  . Smoking status: Former Smoker    Packs/day: 0.25    Years: 1.00    Types: Cigarettes  . Smokeless tobacco: Never Used  . Alcohol use No  . Drug use: No  . Sexual activity: No   Other Topics Concern  . None   Social History Narrative   Lives in [place].       Works - [employer]   Diet - regular   Exercise - dance 3x per week   Review of Systems  Constitutional: Positive for fatigue.  Musculoskeletal: Positive for myalgias.   Objective:  BP 138/62 (BP Location: Left Arm, Patient Position: Sitting, Cuff Size: Large)   Pulse 80   Temp 97.9 F (36.6 C) (Oral)   Resp 16   Ht 5' 2.5" (1.588 m)   Wt 165 lb (74.8 kg)   BMI 29.70 kg/m   BP/Weight 02/27/2016 01/30/2016 [id number]  Systolic BP 0000000 [id number]  Diastolic BP 62 68 70  Wt. (Lbs) 165 165 162.12  BMI 29.7 30.18 29.64   Physical Exam  Constitutional: She is oriented to person, place, and time. She appears well-developed. No distress.  Cardiovascular: Normal rate and  regular rhythm.   2/6 systolic murmur.  Pulmonary/Chest: Effort normal. She has no wheezes. She has no rales.  Neurological: She is alert and oriented to person, place, and time.  Psychiatric: She has a normal mood and affect.  Vitals reviewed.  Lab Results  Component Value Date   WBC 9.9 09/09/2015   HGB 13.5 09/09/2015   HCT 38.2 09/09/2015   PLT 177 09/09/2015   GLUCOSE 162 (H) 11/17/2015   CHOL 172 11/17/2015   TRIG 113.0 11/17/2015   HDL 46.70 11/17/2015   LDLDIRECT 178.7 10/02/2012   LDLCALC 103 (H) 11/17/2015   ALT 16 11/17/2015   AST 13 11/17/2015   NA 137 11/17/2015   K 4.2 11/17/2015   CL 101 11/17/2015   CREATININE 1.19 11/17/2015   BUN 20 11/17/2015   CO2 30 11/17/2015   TSH 1.71 02/14/2015   HGBA1C 8.5 (H) 11/17/2015   MICROALBUR 14.8 (H) 02/14/2015    Assessment & Plan:   Problem List Items Addressed This Visit    Hypertension (Chronic)    Well controlled. Continue Diovan and Norvasc.       Hyperlipidemia (Chronic)    Uncontrolled as of recent lipid panel. Rechecking lipid panel today. Continue crestor. May need increase.  Relevant Orders   Lipid Profile   Type 2 diabetes mellitus with stage 3 chronic kidney disease, with long-term current use of insulin ([place]) - Primary    Established problem, worsening/remains uncontrolled. Increasing Lantus (titrate by 1 unit daily until fastings are 100-120). Continue Onglyza and Amaryl. A1C today.      Relevant Orders   HgB A1c    Other Visit Diagnoses   None.    Follow-up: Return in about 3 months (around 05/29/2016).  [place]

## 2016-02-27 NOTE — Assessment & Plan Note (Signed)
Uncontrolled as of recent lipid panel. Rechecking lipid panel today. Continue crestor. May need increase.

## 2016-02-27 NOTE — Assessment & Plan Note (Signed)
Well controlled. Continue Diovan and Norvasc.

## 2016-02-27 NOTE — Assessment & Plan Note (Signed)
Established problem, worsening/remains uncontrolled. Increasing Lantus (titrate by 1 unit daily until fastings are 100-120). Continue Onglyza and Amaryl. A1C today.

## 2016-03-02 ENCOUNTER — Telehealth: Payer: Self-pay | Admitting: Internal Medicine

## 2016-03-02 NOTE — Telephone Encounter (Signed)
Reviewed labs with patient, see result note. thanks

## 2016-03-02 NOTE — Telephone Encounter (Signed)
Pt is returning phone call from [place]. She thinks it is about her lab results. Please call her at [phone].

## 2016-03-09 ENCOUNTER — Ambulatory Visit: Payer: Medicare Other | Admitting: Hematology and Oncology

## 2016-03-09 ENCOUNTER — Other Ambulatory Visit: Payer: Medicare Other

## 2016-03-11 ENCOUNTER — Other Ambulatory Visit: Payer: Self-pay | Admitting: *Deleted

## 2016-03-11 DIAGNOSIS — Z853 Personal history of malignant neoplasm of breast: Secondary | ICD-10-CM

## 2016-03-12 ENCOUNTER — Inpatient Hospital Stay (HOSPITAL_BASED_OUTPATIENT_CLINIC_OR_DEPARTMENT_OTHER): Payer: Medicare Other | Admitting: Hematology and Oncology

## 2016-03-12 ENCOUNTER — Other Ambulatory Visit: Payer: Self-pay | Admitting: *Deleted

## 2016-03-12 ENCOUNTER — Other Ambulatory Visit: Payer: Self-pay

## 2016-03-12 ENCOUNTER — Inpatient Hospital Stay: Payer: Medicare Other | Attending: Hematology and Oncology

## 2016-03-12 VITALS — BP 145/72 | HR 81 | Temp 96.8°F | Resp 18 | Wt 166.9 lb

## 2016-03-12 DIAGNOSIS — Z7982 Long term (current) use of aspirin: Secondary | ICD-10-CM | POA: Insufficient documentation

## 2016-03-12 DIAGNOSIS — G620 Drug-induced polyneuropathy: Secondary | ICD-10-CM

## 2016-03-12 DIAGNOSIS — R531 Weakness: Secondary | ICD-10-CM | POA: Insufficient documentation

## 2016-03-12 DIAGNOSIS — D0512 Intraductal carcinoma in situ of left breast: Secondary | ICD-10-CM | POA: Diagnosis not present

## 2016-03-12 DIAGNOSIS — Z9221 Personal history of antineoplastic chemotherapy: Secondary | ICD-10-CM

## 2016-03-12 DIAGNOSIS — Z794 Long term (current) use of insulin: Secondary | ICD-10-CM | POA: Insufficient documentation

## 2016-03-12 DIAGNOSIS — C50911 Malignant neoplasm of unspecified site of right female breast: Secondary | ICD-10-CM | POA: Insufficient documentation

## 2016-03-12 DIAGNOSIS — I10 Essential (primary) hypertension: Secondary | ICD-10-CM | POA: Insufficient documentation

## 2016-03-12 DIAGNOSIS — Z170 Estrogen receptor positive status [ER+]: Secondary | ICD-10-CM

## 2016-03-12 DIAGNOSIS — R5383 Other fatigue: Secondary | ICD-10-CM

## 2016-03-12 DIAGNOSIS — E119 Type 2 diabetes mellitus without complications: Secondary | ICD-10-CM | POA: Diagnosis not present

## 2016-03-12 DIAGNOSIS — Z79899 Other long term (current) drug therapy: Secondary | ICD-10-CM | POA: Diagnosis not present

## 2016-03-12 DIAGNOSIS — Z87891 Personal history of nicotine dependence: Secondary | ICD-10-CM

## 2016-03-12 DIAGNOSIS — K219 Gastro-esophageal reflux disease without esophagitis: Secondary | ICD-10-CM | POA: Diagnosis not present

## 2016-03-12 DIAGNOSIS — D649 Anemia, unspecified: Secondary | ICD-10-CM | POA: Diagnosis not present

## 2016-03-12 DIAGNOSIS — C50411 Malignant neoplasm of upper-outer quadrant of right female breast: Secondary | ICD-10-CM

## 2016-03-12 DIAGNOSIS — M549 Dorsalgia, unspecified: Secondary | ICD-10-CM | POA: Diagnosis not present

## 2016-03-12 DIAGNOSIS — Z923 Personal history of irradiation: Secondary | ICD-10-CM | POA: Insufficient documentation

## 2016-03-12 DIAGNOSIS — Z853 Personal history of malignant neoplasm of breast: Secondary | ICD-10-CM

## 2016-03-12 DIAGNOSIS — Z79811 Long term (current) use of aromatase inhibitors: Secondary | ICD-10-CM | POA: Diagnosis not present

## 2016-03-12 DIAGNOSIS — C50412 Malignant neoplasm of upper-outer quadrant of left female breast: Secondary | ICD-10-CM

## 2016-03-12 DIAGNOSIS — T451X5S Adverse effect of antineoplastic and immunosuppressive drugs, sequela: Secondary | ICD-10-CM | POA: Diagnosis not present

## 2016-03-12 DIAGNOSIS — C50312 Malignant neoplasm of lower-inner quadrant of left female breast: Secondary | ICD-10-CM

## 2016-03-12 LAB — CBC WITH DIFFERENTIAL/PLATELET
Basophils Absolute: 0.1 10*3/uL (ref 0–0.1)
Basophils Relative: 1 %
Eosinophils Absolute: 1.1 10*3/uL — ABNORMAL HIGH (ref 0–0.7)
Eosinophils Relative: 10 %
HCT: 34 % — ABNORMAL LOW (ref 35.0–47.0)
Hemoglobin: 12 g/dL (ref 12.0–16.0)
Lymphocytes Relative: 12 %
Lymphs Abs: 1.3 10*3/uL (ref 1.0–3.6)
MCH: 30.2 pg (ref 26.0–34.0)
MCHC: 35.2 g/dL (ref 32.0–36.0)
MCV: 85.8 fL (ref 80.0–100.0)
Monocytes Absolute: 0.6 10*3/uL (ref 0.2–0.9)
Monocytes Relative: 5 %
Neutro Abs: 7.9 10*3/uL — ABNORMAL HIGH (ref 1.4–6.5)
Neutrophils Relative %: 72 %
Platelets: 189 10*3/uL (ref 150–440)
RBC: 3.96 MIL/uL (ref 3.80–5.20)
RDW: 13.2 % (ref 11.5–14.5)
WBC: 10.9 10*3/uL (ref 3.6–11.0)

## 2016-03-12 LAB — COMPREHENSIVE METABOLIC PANEL
ALT: 14 U/L (ref 14–54)
AST: 17 U/L (ref 15–41)
Albumin: 4.3 g/dL (ref 3.5–5.0)
Alkaline Phosphatase: 84 U/L (ref 38–126)
Anion gap: 8 (ref 5–15)
BUN: 22 mg/dL — ABNORMAL HIGH (ref 6–20)
CO2: 25 mmol/L (ref 22–32)
Calcium: 9 mg/dL (ref 8.9–10.3)
Chloride: 99 mmol/L — ABNORMAL LOW (ref 101–111)
Creatinine, Ser: 1.34 mg/dL — ABNORMAL HIGH (ref 0.44–1.00)
GFR calc Af Amer: 44 mL/min — ABNORMAL LOW (ref >60)
GFR calc non Af Amer: 38 mL/min — ABNORMAL LOW (ref >60)
Glucose, Bld: 333 mg/dL — ABNORMAL HIGH (ref 65–99)
Potassium: 4.2 mmol/L (ref 3.5–5.1)
Sodium: 132 mmol/L — ABNORMAL LOW (ref 135–145)
Total Bilirubin: 0.6 mg/dL (ref 0.3–1.2)
Total Protein: 6.5 g/dL (ref 6.5–8.1)

## 2016-03-12 NOTE — Progress Notes (Signed)
[facility] day:  03/12/2016  Chief Complaint: Kelsey Mason is a 75 y.o. female with bilateral stage IA breast cancer who is seen for reassessment.  HPI: The patient is presented with a left breast lump.  Mammogram and ultrasound in 11/2012 revealed bilateral breast abnormalities.    Left breast biopsy revealed DCIS.  Right breast biopsy revealed invasive carcinoma.    She underwent bilateral lumpectomy and sentinel lymph node biopsy on 11/17/2012 by Dr. Jamal Collin.  Pathology from the right breast revealed a 5 mm grade I invasive carcinoma.  There was no DCIS.  Two sentinel lymph nodes were negative.  Pathologic stage was T1aN0M0.  Tumor was ER + (90%), PR + (20%), and Her2/neu -.  Pathology from the left breast revealed a 1.8 cm grade III invasive ductal carcinoma.  There was lymphovascular invasion.  There was no DCIS.  One  sentinel lymph node was negative.  Pathologic stage was T1cN0M0.  Tumor was ER + (>90%), PR + (1-5%), and Her2/neu -.  Oncotype DX testing on the left breast mass revealed a recurrence score of 28 which corresponded to a 10 year risk of distant recurrence of 19% (CI 15-23%) with tamoxifen alone.  She received chemotherapy consisting of Adriamycin and Cytoxan (AC) very 3 weeks x 4 cycles followed by weekly Taxol x 10 (completed 06/05/2013).  She did not receive 2 cycles of Taxol secondary to progressive fatigue and neuropathy.  She received bilateral breast radiation 07/2013 - 09/2013).  She began letrozole (Femara in 03/20150.  Bilateral mammogram at [facility] on 03/21/2015 revealed stable architectural distortion.  Bone density study on 03/21/2015 was normal with a T score of -0.5 in L1-L4 and -0.8 in the left femur.  The patient was last seen by Dr. Oliva Bustard on 09/09/2015.  At that time, she was doing well.   Symptomatically, she notes a persistent neuropathy described as pins and needles in her fingers and toes.  She still feels  weak. She has pain in her legs that causes her to stop.  Her back hurts with doing the dishes.  Regarding her anemia, she denies any melena, hematochezia, hematuria or vaginal bleeding.  Last colonoscopy was in 2016.  Diet is described as pinto beans, fish and chicken.  Past Medical History:  Diagnosis Date  . Breast symptom 2014  . Cancer (HCC)    Bilateral Breast, chemo Dr. Jeb Levering  . Diabetes mellitus   . GERD (gastroesophageal reflux disease)    mild  . Hypertension   . Leakage of biological heart valve graft     Past Surgical History:  Procedure Laterality Date  . ABDOMINAL HYSTERECTOMY  1976   for pelvic pain  . BREAST SURGERY Bilateral 2014   bilateral lumpectomy and SN biopsy  . CHOLECYSTECTOMY  2007  . COLONOSCOPY WITH PROPOFOL N/A 10/13/2015   Procedure: COLONOSCOPY WITH PROPOFOL;  Surgeon: Manya Silvas, MD;  Location: [facility] ENDOSCOPY;  Service: Endoscopy;  Laterality: N/A;  . OOPHORECTOMY Right   . PORTACATH PLACEMENT  2014  . TUMOR REMOVAL Right 1980   right foot    Family History  Problem Relation Age of Onset  . Alcohol abuse Mother   . Heart disease Mother   . Hypertension Mother   . Alcohol abuse Father   . Heart disease Father   . Hypertension Father   . Heart disease Brother     Social History:  reports that she has quit smoking. Her smoking use included Cigarettes.  She has a 0.25 pack-year smoking history. She has never used smokeless tobacco. She reports that she does not drink alcohol or use drugs.  She lives in [place].  The patient is alone today.  Allergies:  Allergies  Allergen Reactions  . Advil [Ibuprofen] Other (See Comments)    Dizziness   . Aleve [Naproxen] Other (See Comments)    Dizziness  . Fluticasone-Salmeterol Other (See Comments)    dizziness  . Lipitor [Atorvastatin] Other (See Comments)    Leg pain, muscle ache  . Penicillins Hives    Current Medications: Current Outpatient Prescriptions  Medication Sig Dispense  Refill  . amLODipine (NORVASC) 5 MG tablet Take 1 tablet (5 mg total) by mouth daily. 90 tablet 3  . aspirin 81 MG tablet Take 81 mg by mouth daily.    . blood glucose meter kit and supplies KIT Dispense based on patient and insurance preference. Use up to four times daily as directed. One Touch Ultra mini meter (FOR ICD-9 250.00, 250.01). Dx: E11.9 1 each 0  . Calcium Carb-Cholecalciferol (SM CALCIUM/VITAMIN D) 600-800 MG-UNIT TABS Take 600-800 Units by mouth daily. 90 tablet 3  . glimepiride (AMARYL) 2 MG tablet TAKE 1 TABLET (2 MG TOTAL) BY MOUTH 2 (TWO) TIMES DAILY. 180 tablet 1  . Insulin Glargine (LANTUS) 100 UNIT/ML Solostar Pen Inject 20 Units into the skin daily at 10 pm. 3 mL 3  . Insulin Pen Needle 32G X 6 MM MISC Use as directed with Lantus pen 100 each 3  . letrozole (FEMARA) 2.5 MG tablet Take 1 tablet (2.5 mg total) by mouth daily. 90 tablet 3  . ONE TOUCH ULTRA TEST test strip USE TO TEST BLOOD SUGAR UP TO 4 TIMES DAILY 100 each 6  . rosuvastatin (CRESTOR) 10 MG tablet TAKE 1 TABLET (10 MG TOTAL) BY MOUTH DAILY. 90 tablet 3  . saxagliptin HCl (ONGLYZA) 5 MG TABS tablet Take 0.5 tablets (2.5 mg total) by mouth daily. 30 tablet 11  . valsartan (DIOVAN) 80 MG tablet Take by mouth.     No current facility-administered medications for this visit.     Review of Systems:  GENERAL:  Feels weak.  No fevers, sweats or weight loss. PERFORMANCE STATUS (ECOG):  1 HEENT:  No visual changes, runny nose, sore throat, mouth sores or tenderness. Lungs: No shortness of breath or cough.  No hemoptysis. Cardiac:  No chest pain, palpitations, orthopnea, or PND. GI:  No nausea, vomiting, diarrhea, constipation, melena or hematochezia. GU:  No urgency, frequency, dysuria, or hematuria. Musculoskeletal:  Back pain with standing.  No joint pain.  No muscle tenderness. Extremities:  Calf pain with walking (? claudication).  No pain or swelling. Skin:  No rashes or skin changes. Neuro:  Pins and  needle sensation in fingers an toes.  No headache, numbness or weakness, balance or coordination issues. Endocrine:  No diabetes, thyroid issues, hot flashes or night sweats. Psych:  No mood changes, depression or anxiety. Pain:  No focal pain. Review of systems:  All other systems reviewed and found to be negative.  Physical Exam: Blood pressure (!) 145/72, pulse 81, temperature (!) 96.8 F (36 C), temperature source Tympanic, resp. rate 18, weight 166 lb 14.2 oz (75.7 kg). GENERAL:  Well developed, well nourished, sitting comfortably in the exam room in no acute distress. MENTAL STATUS:  Alert and oriented to person, place and time. HEAD:  Short brown hair with slight graying.  Normocephalic, atraumatic, face symmetric, no Cushingoid features. EYES:    Blue eyes.  Pupils equal round and reactive to light and accomodation.  No conjunctivitis or scleral icterus. ENT:  Oropharynx clear without lesion.  Tongue normal. Mucous membranes moist.  RESPIRATORY:  Clear to auscultation without rales, wheezes or rhonchi. CARDIOVASCULAR:  Regular rate and rhythm without murmur, rub or gallop. ABDOMEN:  Soft, non-tender, with active bowel sounds, and no hepatosplenomegaly.  No masses. BREAST:  Right breast with upper outer semicircular incision.  No masses, skin changes or nipple discharge.  Left breast with medial longitudinal incision.  Fibrocystic changes. No masses, skin changes or nipple discharge.  SKIN:  No rashes, ulcers or lesions. EXTREMITIES: No edema, no skin discoloration or tenderness.  No palpable cords. LYMPH NODES: No palpable cervical, supraclavicular, axillary or inguinal adenopathy  NEUROLOGICAL: Unremarkable. PSYCH:  Appropriate.   Orders Only on 03/12/2016  Component Date Value Ref Range Status  . WBC 03/12/2016 10.9  3.6 - 11.0 K/uL Final  . RBC 03/12/2016 3.96  3.80 - 5.20 MIL/uL Final  . Hemoglobin 03/12/2016 12.0  12.0 - 16.0 g/dL Final  . HCT 03/12/2016 34.0* 35.0 - 47.0 %  Final  . MCV 03/12/2016 85.8  80.0 - 100.0 fL Final  . MCH 03/12/2016 30.2  26.0 - 34.0 pg Final  . MCHC 03/12/2016 35.2  32.0 - 36.0 g/dL Final  . RDW 03/12/2016 13.2  11.5 - 14.5 % Final  . Platelets 03/12/2016 189  150 - 440 K/uL Final  . Neutrophils Relative % 03/12/2016 72  % Final  . Neutro Abs 03/12/2016 7.9* 1.4 - 6.5 K/uL Final  . Lymphocytes Relative 03/12/2016 12  % Final  . Lymphs Abs 03/12/2016 1.3  1.0 - 3.6 K/uL Final  . Monocytes Relative 03/12/2016 5  % Final  . Monocytes Absolute 03/12/2016 0.6  0.2 - 0.9 K/uL Final  . Eosinophils Relative 03/12/2016 10  % Final  . Eosinophils Absolute 03/12/2016 1.1* 0 - 0.7 K/uL Final  . Basophils Relative 03/12/2016 1  % Final  . Basophils Absolute 03/12/2016 0.1  0 - 0.1 K/uL Final  . Sodium 03/12/2016 132* 135 - 145 mmol/L Final  . Potassium 03/12/2016 4.2  3.5 - 5.1 mmol/L Final  . Chloride 03/12/2016 99* 101 - 111 mmol/L Final  . CO2 03/12/2016 25  22 - 32 mmol/L Final  . Glucose, Bld 03/12/2016 333* 65 - 99 mg/dL Final  . BUN 03/12/2016 22* 6 - 20 mg/dL Final  . Creatinine, Ser 03/12/2016 1.34* 0.44 - 1.00 mg/dL Final  . Calcium 03/12/2016 9.0  8.9 - 10.3 mg/dL Final  . Total Protein 03/12/2016 6.5  6.5 - 8.1 g/dL Final  . Albumin 03/12/2016 4.3  3.5 - 5.0 g/dL Final  . AST 03/12/2016 17  15 - 41 U/L Final  . ALT 03/12/2016 14  14 - 54 U/L Final  . Alkaline Phosphatase 03/12/2016 84  38 - 126 U/L Final  . Total Bilirubin 03/12/2016 0.6  0.3 - 1.2 mg/dL Final  . GFR calc non Af Amer 03/12/2016 38* >60 mL/min Final  . GFR calc Af Amer 03/12/2016 44* >60 mL/min Final   Comment: (NOTE) The eGFR has been calculated using the CKD EPI equation. This calculation has not been validated in all clinical situations. eGFR's persistently <60 mL/min signify possible Chronic Kidney Disease.   . Anion gap 03/12/2016 8  5 - 15 Final    Assessment:  Kelsey Mason is a 74 y.o. female with bilateral breast cancer s/p lumpectomy  and sentinel lymph node biopsy on   11/17/2012.  Right breast revealed a 5 mm grade I invasive carcinoma.  There was no DCIS.  Two sentinel lymph nodes were negative.  Pathologic stage was T1aN0M0.  Tumor was ER + (90%), PR + (20%), and Her2/neu -.   Left breast revealed a 1.8 cm grade III invasive ductal carcinoma.  There was lymphovascular invasion.  There was no DCIS.  One sentinel lymph node was negative.  Pathologic stage was T1cN0M0.  Tumor was ER + (>90%), PR + (1-5%), and Her2/neu -.  Oncotype DX testing on the left breast mass revealed a recurrence score of 28 which corresponded to a 10 year risk of distant recurrence of 19% (CI 15-23%) with tamoxifen alone.  She received Adriamycin and Cytoxan (AC) x 4 cycles followed by weekly Taxol x 10 (completed 06/05/2013).  She did not receive 2 cycles of Taxol secondary to progressive neuropathy.  She received bilateral breast radiation (07/2013 - 09/2013).  She began letrozole (Femara) in 09/2013.  Bilateral mammogram at [facility] on 03/21/2015 revealed stable architectural distortion.  Bone density study on 03/21/2015 was normal with a T score of -0.5 in L1-L4 and -0.8 in the left femur.  She has a borderline mild normocytic anemia.  Diet appears modest.  She denies any melena, hematochezia, hematuria or vaginal bleeding.  Last colonoscopy was in 2016.    Symptomatically, she has a residual neuropathy.  Exam is unremarkable.  Plan: 1.  Discuss diagnosis staging bilateral breast cancer. Discuss hormonal treatment with Femara.  Discuss 5 versus 10 years of adjuvant therapy. Discuss testing for BCI testing after 4-1/2 years (03/2018) to assess benefit of extended adjuvant hormonal therapy. 2.  Discuss side effects of Femara.  Discuss bone density study every 2 years.  Obtain copy of last bone density-done. 3.  Continue Femara. 4.  Discuss mild anemia. Discuss iron rich foods.  Discuss follow-up blood counts with Dr. Jennifer Walker. 5.  Follow-up  mammogram on 03/22/2016. 6.  RTC in 6 months for MD assessment, labs (CBC with diff, CMP, CA27.29, ferritin, iron studies).   Melissa C Corcoran, MD  03/12/2016, 2:39 PM 

## 2016-03-14 ENCOUNTER — Encounter: Payer: Self-pay | Admitting: Hematology and Oncology

## 2016-03-15 ENCOUNTER — Telehealth: Payer: Self-pay | Admitting: *Deleted

## 2016-03-15 NOTE — Telephone Encounter (Signed)
Called patient to inform her when she was here on Friday, her blood glucose was 333.  She stated she had been having problems with it being elevated and saw her PCP prior to her last appointment here and the PCP had increased her insulin. She will let PCP know.

## 2016-03-22 ENCOUNTER — Ambulatory Visit
Admission: RE | Admit: 2016-03-22 | Discharge: 2016-03-22 | Disposition: A | Discharge status: Home / Self Care | Payer: Medicare Other | Source: Ambulatory Visit | Attending: General Surgery | Admitting: General Surgery

## 2016-03-22 ENCOUNTER — Other Ambulatory Visit: Payer: Self-pay | Admitting: Family Medicine

## 2016-03-22 ENCOUNTER — Other Ambulatory Visit: Payer: Self-pay | Admitting: General Surgery

## 2016-03-22 DIAGNOSIS — C50312 Malignant neoplasm of lower-inner quadrant of left female breast: Secondary | ICD-10-CM | POA: Diagnosis present

## 2016-03-22 DIAGNOSIS — C50411 Malignant neoplasm of upper-outer quadrant of right female breast: Secondary | ICD-10-CM | POA: Insufficient documentation

## 2016-03-22 MED ORDER — INSULIN PEN NEEDLE 32G X 6 MM MISC: 3 refills | Status: DC

## 2016-03-29 ENCOUNTER — Ambulatory Visit (INDEPENDENT_AMBULATORY_CARE_PROVIDER_SITE_OTHER): Payer: Medicare Other | Admitting: General Surgery

## 2016-03-29 ENCOUNTER — Encounter: Payer: Self-pay | Admitting: General Surgery

## 2016-03-29 VITALS — BP 124/68 | HR 74 | Resp 14 | Ht 62.0 in | Wt 168.0 lb

## 2016-03-29 DIAGNOSIS — C50312 Malignant neoplasm of lower-inner quadrant of left female breast: Secondary | ICD-10-CM

## 2016-03-29 DIAGNOSIS — C50411 Malignant neoplasm of upper-outer quadrant of right female breast: Secondary | ICD-10-CM | POA: Diagnosis not present

## 2016-03-29 NOTE — Progress Notes (Signed)
Patient ID: Kelsey Mason, female   DOB: 11/15/1940, 75 y.o.   MRN: [id number]  Chief Complaint  Patient presents with  . Follow-up    HPI Kelsey Mason is a 75 y.o. female who presents for a breast cancer follow up. The most recent mammogram was done on 03/22/16 Patient does perform regular self breast checks and gets regular mammograms done.    I have reviewed the history of present illness with the patient.  HPI  Past Medical History:  Diagnosis Date  . Breast symptom 2014  . Cancer (HCC)    Bilateral Breast, chemo Dr. Jeb Levering  . Diabetes mellitus   . GERD (gastroesophageal reflux disease)    mild  . Hypertension   . Leakage of biological heart valve graft     Past Surgical History:  Procedure Laterality Date  . ABDOMINAL HYSTERECTOMY  1976   for pelvic pain  . BREAST EXCISIONAL BIOPSY Bilateral 11/2012   bilat breast ca chemo and rad  . BREAST SURGERY Bilateral 2014   bilateral lumpectomy and SN biopsy  . CHOLECYSTECTOMY  2007  . COLONOSCOPY WITH PROPOFOL N/A 10/13/2015   Procedure: COLONOSCOPY WITH PROPOFOL;  Surgeon: Manya Silvas, MD;  Location: [facility] ENDOSCOPY;  Service: Endoscopy;  Laterality: N/A;  . OOPHORECTOMY Right   . PORTACATH PLACEMENT  2014  . TUMOR REMOVAL Right 1980   right foot    Family History  Problem Relation Age of Onset  . Alcohol abuse Mother   . Heart disease Mother   . Hypertension Mother   . Alcohol abuse Father   . Heart disease Father   . Hypertension Father   . Heart disease Brother     Social History Social History  Substance Use Topics  . Smoking status: Former Smoker    Packs/day: 0.25    Years: 1.00    Types: Cigarettes  . Smokeless tobacco: Never Used  . Alcohol use No    Allergies  Allergen Reactions  . Advil [Ibuprofen] Other (See Comments)    Dizziness   . Aleve [Naproxen] Other (See Comments)    Dizziness  . Fluticasone-Salmeterol Other (See Comments)    dizziness  . Lipitor  [Atorvastatin] Other (See Comments)    Leg pain, muscle ache  . Penicillins Hives    Current Outpatient Prescriptions  Medication Sig Dispense Refill  . amLODipine (NORVASC) 5 MG tablet Take 1 tablet (5 mg total) by mouth daily. 90 tablet 3  . aspirin 81 MG tablet Take 81 mg by mouth daily.    . blood glucose meter kit and supplies KIT Dispense based on patient and insurance preference. Use up to four times daily as directed. One Touch Ultra mini meter (FOR ICD-9 250.00, 250.01). Dx: E11.9 1 each 0  . Calcium Carb-Cholecalciferol (SM CALCIUM/VITAMIN D) 600-800 MG-UNIT TABS Take 600-800 Units by mouth daily. 90 tablet 3  . glimepiride (AMARYL) 2 MG tablet TAKE 1 TABLET (2 MG TOTAL) BY MOUTH 2 (TWO) TIMES DAILY. 180 tablet 1  . Insulin Glargine (LANTUS) 100 UNIT/ML Solostar Pen Inject 20 Units into the skin daily at 10 pm. 3 mL 3  . Insulin Pen Needle 32G X 6 MM MISC Use as directed with Lantus pen 100 each 3  . letrozole (FEMARA) 2.5 MG tablet Take 1 tablet (2.5 mg total) by mouth daily. 90 tablet 3  . ONE TOUCH ULTRA TEST test strip USE TO TEST BLOOD SUGAR UP TO 4 TIMES DAILY 100 each 6  . rosuvastatin (CRESTOR) 10  MG tablet TAKE 1 TABLET (10 MG TOTAL) BY MOUTH DAILY. 90 tablet 3  . saxagliptin HCl (ONGLYZA) 5 MG TABS tablet Take 0.5 tablets (2.5 mg total) by mouth daily. 30 tablet 11  . valsartan (DIOVAN) 80 MG tablet Take by mouth.     No current facility-administered medications for this visit.     Review of Systems Review of Systems  Constitutional: Negative.   Respiratory: Negative.   Cardiovascular: Negative.     Blood pressure 124/68, pulse 74, resp. rate 14, height _0  (1.575 m), weight 168 lb (76.2 kg).  Physical Exam Physical Exam  Constitutional: She is oriented to person, place, and time. She appears well-developed and well-nourished.  Eyes: Conjunctivae are normal. No scleral icterus.  Neck: Neck supple. No tracheal deviation present.  Cardiovascular: Normal rate  and regular rhythm.   Pulmonary/Chest: Effort normal and breath sounds normal. Right breast exhibits no mass, no nipple discharge, no skin change and no tenderness. Left breast exhibits no mass, no nipple discharge, no skin change and no tenderness.  Sunken right and left nipple, chronic from surgery  Abdominal: Soft. Normal appearance and bowel sounds are normal. There is no hepatomegaly. There is no tenderness.  Lymphadenopathy:    She has no cervical adenopathy.    She has no axillary adenopathy.  Neurological: She is alert and oriented to person, place, and time.  Skin: Skin is warm and dry.    Data Reviewed Mammogram reviewed   Assessment    Stable exam. Bilateral breast CA. Currently on letrazole     Plan    The patient has been asked to return to the office in one year with a bilateral diagnostic mammogram. Continue taking letrozole.     This information has been scribed by Gaspar Cola CMA.   SANKAR,SEEPLAPUTHUR G 03/29/2016, 1:47 PM

## 2016-03-29 NOTE — Patient Instructions (Signed)
The patient has been asked to return to the office in one year with a bilateral diagnostic mammogram. 

## 2016-04-21 ENCOUNTER — Telehealth: Payer: Self-pay | Admitting: Family Medicine

## 2016-04-21 MED ORDER — GLUCOSE BLOOD VI STRP: ORAL_STRIP | 6 refills | Status: AC

## 2016-04-21 NOTE — Telephone Encounter (Signed)
refilled 

## 2016-04-21 NOTE — Telephone Encounter (Signed)
PT called stating that she needs a refill on ONE TOUCH ULTRA TEST test strip. Thank you!  Pharmacy - [id number] - [place], [address]  Call pt @ [phone]

## 2016-05-17 ENCOUNTER — Other Ambulatory Visit: Payer: Self-pay

## 2016-05-17 MED ORDER — GLIMEPIRIDE 2 MG PO TABS: ORAL_TABLET | ORAL | 1 refills | Status: DC

## 2016-06-02 ENCOUNTER — Other Ambulatory Visit: Payer: Self-pay | Admitting: Family Medicine

## 2016-06-02 ENCOUNTER — Ambulatory Visit: Payer: Medicare Other | Admitting: Family Medicine

## 2016-06-02 MED ORDER — ROSUVASTATIN CALCIUM 10 MG PO TABS: ORAL_TABLET | ORAL | 3 refills | Status: DC

## 2016-06-03 ENCOUNTER — Ambulatory Visit (INDEPENDENT_AMBULATORY_CARE_PROVIDER_SITE_OTHER): Payer: Medicare Other | Admitting: Family Medicine

## 2016-06-03 ENCOUNTER — Encounter: Payer: Self-pay | Admitting: Family Medicine

## 2016-06-03 VITALS — BP 167/84 | HR 78 | Temp 98.9°F | Resp 16 | Wt 171.4 lb

## 2016-06-03 DIAGNOSIS — G8929 Other chronic pain: Secondary | ICD-10-CM

## 2016-06-03 DIAGNOSIS — E1122 Type 2 diabetes mellitus with diabetic chronic kidney disease: Secondary | ICD-10-CM

## 2016-06-03 DIAGNOSIS — E785 Hyperlipidemia, unspecified: Secondary | ICD-10-CM

## 2016-06-03 DIAGNOSIS — Z23 Encounter for immunization: Secondary | ICD-10-CM | POA: Diagnosis not present

## 2016-06-03 DIAGNOSIS — I5022 Chronic systolic (congestive) heart failure: Secondary | ICD-10-CM

## 2016-06-03 DIAGNOSIS — M25512 Pain in left shoulder: Secondary | ICD-10-CM | POA: Insufficient documentation

## 2016-06-03 DIAGNOSIS — I10 Essential (primary) hypertension: Secondary | ICD-10-CM

## 2016-06-03 DIAGNOSIS — N183 Chronic kidney disease, stage 3 unspecified: Secondary | ICD-10-CM

## 2016-06-03 DIAGNOSIS — Z794 Long term (current) use of insulin: Secondary | ICD-10-CM | POA: Diagnosis not present

## 2016-06-03 LAB — HEMOGLOBIN A1C: Hgb A1c MFr Bld: 7.9 % — ABNORMAL HIGH (ref 4.6–6.5)

## 2016-06-03 MED ORDER — TRAMADOL HCL 50 MG PO TABS: 50.0000 mg | ORAL_TABLET | Freq: Three times a day (TID) | ORAL | 0 refills | Status: DC | PRN

## 2016-06-03 NOTE — Patient Instructions (Signed)
Continue your meds. Be sure to take your BP meds.  Take the tramadol as needed for your pain. Consider PT.  Follow up in 3 months.  Take care  Dr. Lacinda Axon

## 2016-06-03 NOTE — Assessment & Plan Note (Signed)
Improving. A1C today. Continue current insulin regimen, Onglyza, and Amaryl.

## 2016-06-03 NOTE — Assessment & Plan Note (Signed)
Does not appear overloaded today. I am sending her to [facility] (she is not on standard of care and is okay with switch).

## 2016-06-03 NOTE — Assessment & Plan Note (Signed)
BP elevated today. This is secondary to the fact that she's not taken her medication. Advised her to take her medication as prescribed. Will continue to follow closely.

## 2016-06-03 NOTE — Progress Notes (Signed)
Subjective:  Patient ID: Kelsey Mason, female    DOB: 25-Feb-1941  Age: 75 y.o. MRN: [id number]  CC: Follow up   HPI:  75 year old female with DM 2 with combinations, hypertension, hyperlipidemia, CKD, systolic heart failure presents for follow-up. She also complains of left shoulder pain.  HTN  Not at goal.  He has previously been well controlled.  She has not taken her medication today.  She is currently on Diovan and amlodipine.  HLD  At goal on Crestor.  DM-2  Blood sugar readings improving. She states that her fasting blood sugar this morning was 106.  She has increased her insulin per my recommendations. She is currently on Lantus 26 units daily, Onglyza, and Amaryl.  Systolic CHF  Patient has known systolic heart failure.  She's followed by cardiology.  She is not currently on a beta blocker. No diuretic at this time either.  I am not sure why she is not on standard of care in regards to ACEI/ARB + Beta blocker.  She reports that she's recently had some increasing lower extremity edema.  No reports of increasing shortness of breath.  Left shoulder pain  Patient reports that she's had this issue for the past 7-8 months.  No injury.  Moderate to severe.  Exacerbated by certain movements.  No known relieving factors.  No medications or interventions tried.   Social Hx   Social History   Social History  . Marital status: Widowed    Spouse name: N/A  . Number of children: N/A  . Years of education: N/A   Social History Main Topics  . Smoking status: Former Smoker    Packs/day: 0.25    Years: 1.00    Types: Cigarettes  . Smokeless tobacco: Never Used  . Alcohol use No  . Drug use: No  . Sexual activity: No   Other Topics Concern  . None   Social History Narrative   Lives in [place].       Works - [employer]   Diet - regular   Exercise - dance 3x per week    Review of Systems  Constitutional: Negative.     Cardiovascular: Positive for leg swelling.  Musculoskeletal:       Shoulder pain.   Objective:  BP (!) 167/84 (BP Location: Left Arm, Patient Position: Sitting, Cuff Size: Normal)   Pulse 78   Temp 98.9 F (37.2 C) (Oral)   Resp 16   Wt 171 lb 6 oz (77.7 kg)   SpO2 98%   BMI 31.34 kg/m   BP/Weight 06/03/2016 03/20/3845 12/07/9933  Systolic BP 701 779 390  Diastolic BP 84 68 72  Wt. (Lbs) 171.38 168 166.89  BMI 31.34 30.73 30.04    Physical Exam  Constitutional: She is oriented to person, place, and time. She appears well-developed. No distress.  Cardiovascular: Normal rate and regular rhythm.   2/6 systolic murmur.   Pulmonary/Chest: Effort normal and breath sounds normal.  Musculoskeletal:  Shoulder: Inspection reveals no abnormalities, atrophy or asymmetry. Palpation is normal with no tenderness over AC joint or bicipital groove. ROM decreased in flexion. Rotator cuff strength normal throughout. + Hawkins test.   Neurological: She is alert and oriented to person, place, and time.  Psychiatric: She has a normal mood and affect.  Vitals reviewed.  Lab Results  Component Value Date   WBC 10.9 03/12/2016   HGB 12.0 03/12/2016   HCT 34.0 (L) 03/12/2016   PLT 189 03/12/2016   GLUCOSE  333 (H) 03/12/2016   CHOL 158 02/27/2016   TRIG 161.0 (H) 02/27/2016   HDL 55.50 02/27/2016   LDLDIRECT 178.7 10/02/2012   LDLCALC 70 02/27/2016   ALT 14 03/12/2016   AST 17 03/12/2016   NA 132 (L) 03/12/2016   K 4.2 03/12/2016   CL 99 (L) 03/12/2016   CREATININE 1.34 (H) 03/12/2016   BUN 22 (H) 03/12/2016   CO2 25 03/12/2016   TSH 1.71 02/14/2015   HGBA1C 7.9 (H) 06/03/2016   MICROALBUR 14.8 (H) 02/14/2015    Assessment & Plan:   Problem List Items Addressed This Visit    Type 2 diabetes mellitus with stage 3 chronic kidney disease, with long-term current use of insulin ([place]) - Primary    Improving. A1C today. Continue current insulin regimen, Onglyza, and Amaryl.       Relevant Orders   Hemoglobin A1c (Completed)   Left shoulder pain    New problem. Treating with tramadol.      Hypertension (Chronic)    BP elevated today. This is secondary to the fact that she's not taken her medication. Advised her to take her medication as prescribed. Will continue to follow closely.      Hyperlipidemia (Chronic)    Well controlled on Crestor.      Chronic systolic congestive heart failure (HCC) (Chronic)    Does not appear overloaded today. I am sending her to [facility] (she is not on standard of care and is okay with switch).       Other Visit Diagnoses    Encounter for immunization       Relevant Orders   Flu vaccine HIGH DOSE PF (Completed)     Meds ordered this encounter  Medications  . traMADol (ULTRAM) 50 MG tablet    Sig: Take 1 tablet (50 mg total) by mouth every 8 (eight) hours as needed.    Dispense:  30 tablet    Refill:  0    Follow-up: 3 months.  [place]

## 2016-06-03 NOTE — Assessment & Plan Note (Signed)
New problem. Treating with tramadol.

## 2016-06-03 NOTE — Assessment & Plan Note (Signed)
Well controlled on Crestor.

## 2016-06-04 ENCOUNTER — Other Ambulatory Visit: Payer: Self-pay | Admitting: Family Medicine

## 2016-06-04 DIAGNOSIS — I5022 Chronic systolic (congestive) heart failure: Secondary | ICD-10-CM

## 2016-06-09 ENCOUNTER — Ambulatory Visit (INDEPENDENT_AMBULATORY_CARE_PROVIDER_SITE_OTHER): Payer: Medicare Other | Admitting: Internal Medicine

## 2016-06-09 ENCOUNTER — Encounter: Payer: Self-pay | Admitting: Internal Medicine

## 2016-06-09 VITALS — BP 150/80 | HR 88 | Ht 62.5 in | Wt 170.2 lb

## 2016-06-09 DIAGNOSIS — I255 Ischemic cardiomyopathy: Secondary | ICD-10-CM | POA: Diagnosis not present

## 2016-06-09 DIAGNOSIS — I340 Nonrheumatic mitral (valve) insufficiency: Secondary | ICD-10-CM

## 2016-06-09 DIAGNOSIS — M79605 Pain in left leg: Secondary | ICD-10-CM

## 2016-06-09 DIAGNOSIS — M79604 Pain in right leg: Secondary | ICD-10-CM | POA: Diagnosis not present

## 2016-06-09 DIAGNOSIS — I10 Essential (primary) hypertension: Secondary | ICD-10-CM

## 2016-06-09 DIAGNOSIS — I5022 Chronic systolic (congestive) heart failure: Secondary | ICD-10-CM | POA: Diagnosis not present

## 2016-06-09 MED ORDER — CARVEDILOL 3.125 MG PO TABS: 3.1250 mg | ORAL_TABLET | Freq: Two times a day (BID) | ORAL | 3 refills | Status: DC

## 2016-06-09 NOTE — Patient Instructions (Addendum)
Medication Instructions:  Your physician has recommended you make the following change in your medication:  START taking coreg 3.125mg  twice daily   Labwork: none  Testing/Procedures: Your physician has requested that you have a lower or upper extremity arterial duplex. This test is an ultrasound of the arteries in the legs or arms. It looks at arterial blood flow in the legs and arms. Allow one hour for Lower and Upper Arterial scans. There are no restrictions or special instructions  Your physician has requested that you have an ankle brachial index (ABI). During this test an ultrasound and blood pressure cuff are used to evaluate the arteries that supply the arms and legs with blood. Allow thirty minutes for this exam. There are no restrictions or special instructions.  Your physician has requested that you have a lexiscan myoview. For further information please visit [email or web address]. Please follow instruction sheet, as given.  [place]  Your caregiver has ordered a Stress Test with nuclear imaging. The purpose of this test is to evaluate the blood supply to your heart muscle. This procedure is referred to as a "Non-Invasive Stress Test." This is because other than having an IV started in your vein, nothing is inserted or "invades" your body. Cardiac stress tests are done to find areas of poor blood flow to the heart by determining the extent of coronary artery disease (CAD). Some patients exercise on a treadmill, which naturally increases the blood flow to your heart, while others who are  unable to walk on a treadmill due to physical limitations have a pharmacologic/chemical stress agent called Lexiscan . This medicine will mimic walking on a treadmill by temporarily increasing your coronary blood flow.   Please note: these test may take anywhere between 2-4 hours to complete  PLEASE REPORT TO [facility] AT THE FIRST DESK WILL DIRECT YOU WHERE TO  GO  Date of Procedure:_Wed, Dec 13______  Arrival Time for Procedure:___7:15am___  Instructions regarding medication:   _xx___ : Hold diabetes medication morning of procedure  _xx___:  Hold coreg night before procedure and morning of procedure  _xx___:  Hold other medications as follows: Take only 1/2 dose (10 units) of evening insulin the night before your test.  PLEASE NOTIFY THE OFFICE AT LEAST 24 HOURS IN ADVANCE IF YOU ARE UNABLE TO KEEP YOUR APPOINTMENT.  [phone] AND  PLEASE NOTIFY NUCLEAR MEDICINE AT [facility] AT LEAST 24 HOURS IN ADVANCE IF YOU ARE UNABLE TO KEEP YOUR APPOINTMENT. [phone]  How to prepare for your Myoview test:  1. Do not eat or drink after midnight 2. No caffeine for 24 hours prior to test 3. No smoking 24 hours prior to test. 4. Your medication may be taken with water.  If your doctor stopped a medication because of this test, do not take that medication. 5. Ladies, please do not wear dresses.  Skirts or pants are appropriate. Please wear a short sleeve shirt. 6. No perfume, cologne or lotion. 7. Wear comfortable walking shoes. No heels!            Follow-Up: Your physician recommends that you schedule a follow-up appointment in: 6 weeks with Dr. Saunders Revel.   Any Other Special Instructions Will Be Listed Below (If Applicable).     If you need a refill on your cardiac medications before your next appointment, please call your pharmacy.  Cardiac Nuclear Scanning A cardiac nuclear scan is used to check your heart for problems, such as the following:  A  portion of the heart is not getting enough blood.  Part of the heart muscle has died, which happens with a heart attack.  The heart wall is not working normally.  In this test, a radioactive dye (tracer) is injected into your bloodstream. After the tracer has traveled to your heart, a scanning device is used to measure how much of the tracer is absorbed by or distributed to various areas of  your heart. LET [facility] CARE PROVIDER KNOW ABOUT:  Any allergies you have.  All medicines you are taking, including vitamins, herbs, eye drops, creams, and over-the-counter medicines.  Previous problems you or members of your family have had with the use of anesthetics.  Any blood disorders you have.  Previous surgeries you have had.  Medical conditions you have.  RISKS AND COMPLICATIONS Generally, this is a safe procedure. However, as with any procedure, problems can occur. Possible problems include:   Serious chest pain.  Rapid heartbeat.  Sensation of warmth in your chest. This usually passes quickly. BEFORE THE PROCEDURE Ask your health care provider about changing or stopping your regular medicines. PROCEDURE This procedure is usually done at a hospital and takes 2-4 hours.  An IV tube is inserted into one of your veins.  Your health care provider will inject a small amount of radioactive tracer through the tube.  You will then wait for 20-40 minutes while the tracer travels through your bloodstream.  You will lie down on an exam table so images of your heart can be taken. Images will be taken for about 15-20 minutes.  You will exercise on a treadmill or stationary bike. While you exercise, your heart activity will be monitored with an electrocardiogram (ECG), and your blood pressure will be checked.  If you are unable to exercise, you may be given a medicine to make your heart beat faster.  When blood flow to your heart has peaked, tracer will again be injected through the IV tube.  After 20-40 minutes, you will get back on the exam table and have more images taken of your heart.  When the procedure is over, your IV tube will be removed. AFTER THE PROCEDURE  You will likely be able to leave shortly after the test. Unless your health care provider tells you otherwise, you may return to your normal schedule, including diet, activities, and medicines.  Make  sure you find out how and when you will get your test results. This information is not intended to replace advice given to you by your health care provider. Make sure you discuss any questions you have with your health care provider. Document Released: 07/16/2004 Document Revised: 06/26/2013 Document Reviewed: 05/30/2013 Elsevier Interactive Patient Education  2017 [employer].

## 2016-06-09 NOTE — Progress Notes (Signed)
New Outpatient Visit Date: 06/09/2016  Referring Provider: Coral Spikes, DO [address], [place] [address]  Chief Complaint: Management of chronic systolic heart failure and mitral regurgitation  HPI:  [name] is a 75 y.o. year-old female with history of chronic systolic heart failure reportedly due to non-ischemic cardiomyopathy, mitral regurgitation, hypertension, hyperlipidemia, type 2 diabetes mellitus, , and breast cancer who has been referred by Dr. Lacinda Axon for management of chronic systolic heart failure. Patient reports history of heart disease for several years, and believes it began after treatment for her breast cancer. She has been followed by Dr. Nehemiah Massed for several years, having seen him most recently in August. Since that time, she has felt relatively well up until the last few weeks. She noticed lower extremity swelling for 2 days after Thanksgiving that has since resolved. She has also experienced a 6 pound increase in her weight over the last few weeks despite not having any significant dietary changes or increased salt intake. She has mild exertional dyspnea that has been stable but denies shortness of breath at rest as well as orthopnea and PND. She also denies chest pain.. She notes occasional "skipped beats" without associated symptoms like chest pain or lightheadedness. She has experienced brief episodes of lightheadedness for several years on a very infrequent basis, typically happening 1 or 2 times per year.  The patient has limited mobility owing to back and leg pain that has been present for several years. She notes a remote back injury but states that her symptoms have become worse over the last few years. She notes pain in both calves when walking as well as pain in her back and thighs, particularly when bending over. Prior testing includes MUGA and echocardiogram (see details below), as well as a stress test around 2007 that the patient believes was  normal (records not available for review). The patient drinks 1 to 2 caffeinated sodas and one cup of coffee per day.  --------------------------------------------------------------------------------------------------  Cardiovascular History & Procedures: Cardiovascular Problems:  Chronic systolic heart failure  Mitral regurgitation  Risk Factors:  Hypertension, hyperlipidemia, type 2 diabetes mellitus, and age > 46  Cath/PCI:  None  CV Surgery:  None  EP Procedures and Devices:  None  Non-Invasive Evaluation(s):  TTE (02/23/16, [facility]): Mildly dilated left ventricle with mild to moderate LV dysfunction (EF 35-45%). Grade 1 diastolic dysfunction. Moderate mitral regurgitation. Mild left atrial enlargement. Normal RV function.  TTE (01/01/14, [facility]): Moderate to severe LV dysfunction (EF 30%) with mild LVH and mild left ventricular dilation. Mitral annular calcification with moderate MR and moderate TR noted. Normal right ventricular contraction.  MUGA (12/25/12): Normal contraction and wall motion. EF 63%.  Recent CV Pertinent Labs: Lab Results  Component Value Date   CHOL 158 02/27/2016   HDL 55.50 02/27/2016   LDLCALC 70 02/27/2016   LDLDIRECT 178.7 10/02/2012   TRIG 161.0 (H) 02/27/2016   CHOLHDL 3 02/27/2016   K 4.2 03/12/2016   K 4.7 01/07/2014   BUN 22 (H) 03/12/2016   BUN 19 (H) 01/07/2014   CREATININE 1.34 (H) 03/12/2016   CREATININE 1.24 01/07/2014    --------------------------------------------------------------------------------------------------  Past Medical History:  Diagnosis Date  . Breast symptom 2014  . Cancer (HCC)    Bilateral Breast, chemo Dr. Jeb Levering  . Diabetes mellitus   . GERD (gastroesophageal reflux disease)    mild  . Hypertension   . Leakage of biological heart valve graft     Past Surgical  History:  Procedure Laterality Date  . ABDOMINAL HYSTERECTOMY  1976   for pelvic pain  . BREAST EXCISIONAL BIOPSY  Bilateral 11/2012   bilat breast ca chemo and rad  . BREAST SURGERY Bilateral 2014   bilateral lumpectomy and SN biopsy  . CHOLECYSTECTOMY  2007  . COLONOSCOPY WITH PROPOFOL N/A 10/13/2015   Procedure: COLONOSCOPY WITH PROPOFOL;  Surgeon: Manya Silvas, MD;  Location: [facility] ENDOSCOPY;  Service: Endoscopy;  Laterality: N/A;  . OOPHORECTOMY Right   . PORTACATH PLACEMENT  2014  . TUMOR REMOVAL Right 1980   right foot    Outpatient Encounter Prescriptions as of 06/09/2016  Medication Sig  . amLODipine (NORVASC) 5 MG tablet Take 1 tablet (5 mg total) by mouth daily.  [name] aspirin 81 MG tablet Take 81 mg by mouth daily.  . blood glucose meter kit and supplies KIT Dispense based on patient and insurance preference. Use up to four times daily as directed. One Touch Ultra mini meter (FOR ICD-9 250.00, 250.01). Dx: E11.9  . Calcium Carb-Cholecalciferol (SM CALCIUM/VITAMIN D) 600-800 MG-UNIT TABS Take 600-800 Units by mouth daily.  [name] glimepiride (AMARYL) 2 MG tablet TAKE 1 TABLET (2 MG TOTAL) BY MOUTH 2 (TWO) TIMES DAILY.  [name] glucose blood (ONE TOUCH ULTRA TEST) test strip USE TO TEST BLOOD SUGAR UP TO 4 TIMES DAILY  . Insulin Glargine (LANTUS) 100 UNIT/ML Solostar Pen Inject 20 Units into the skin daily at 10 pm.  . Insulin Pen Needle 32G X 6 MM MISC Use as directed with Lantus pen  . letrozole (FEMARA) 2.5 MG tablet Take 1 tablet (2.5 mg total) by mouth daily.  . rosuvastatin (CRESTOR) 10 MG tablet TAKE 1 TABLET (10 MG TOTAL) BY MOUTH DAILY.  . saxagliptin HCl (ONGLYZA) 5 MG TABS tablet Take 0.5 tablets (2.5 mg total) by mouth daily.  . traMADol (ULTRAM) 50 MG tablet Take 1 tablet (50 mg total) by mouth every 8 (eight) hours as needed.  . valsartan (DIOVAN) 80 MG tablet Take by mouth.   No facility-administered encounter medications on file as of 06/09/2016.     Allergies: Advil [ibuprofen]; Aleve [naproxen]; Fluticasone-salmeterol; Lipitor [atorvastatin]; and Penicillins  Social History    Social History  . Marital status: Widowed    Spouse name: N/A  . Number of children: N/A  . Years of education: N/A   Occupational History  . Not on file.   Social History Main Topics  . Smoking status: Former Smoker    Packs/day: 0.25    Years: 1.00    Types: Cigarettes    Quit date: 1963  . Smokeless tobacco: Never Used  . Alcohol use No  . Drug use: No  . Sexual activity: No   Other Topics Concern  . Not on file   Social History Narrative   Lives in [place].       Works - [employer]   Diet - regular   Exercise - dance 3x per week    Family History  Problem Relation Age of Onset  . Alcohol abuse Mother   . Heart disease Mother   . Hypertension Mother   . Alcohol abuse Father   . Heart disease Father   . Hypertension Father   . Heart disease Brother     Review of Systems: A 12-system review of systems was performed and was negative except as noted in the HPI.  --------------------------------------------------------------------------------------------------  Physical Exam: BP (!) 150/80 (BP Location: Right Arm, Patient Position: Sitting, Cuff Size: Normal)  Pulse 88   Ht 5' 2.5" (1.588 m)   Wt 170 lb 4 oz (77.2 kg)   BMI 30.64 kg/m   General:  Overweight woman seated comfortably in the exam room. HEENT: No conjunctival pallor or scleral icterus.  Moist mucous membranes.  OP clear. Neck: Supple without lymphadenopathy, thyromegaly, JVD, or HJR.  No carotid bruit. Lungs: Normal work of breathing.  Clear to auscultation bilaterally without wheezes or crackles. Heart: Regular rate and rhythm 2/6 systolic murmur at the left lower sternal border. No rubs or gallops. Non-displaced PMI. Abd: Bowel sounds present.  Soft, NT/ND without hepatosplenomegaly Ext: Trace ankle and foot edema bilaterally.  Radial pulses are 2+. Pedal pulses are 1+ bilaterally. Skin: warm and dry without rash Neuro: CNIII-XII intact.  Strength and fine-touch sensation intact  in upper and lower extremities bilaterally. Psych: Normal mood and affect.  EKG:  Normal sinus rhythm with borderline LVH and incomplete left bundle branch block.  Lab Results  Component Value Date   WBC 10.9 03/12/2016   HGB 12.0 03/12/2016   HCT 34.0 (L) 03/12/2016   MCV 85.8 03/12/2016   PLT 189 03/12/2016    Lab Results  Component Value Date   NA 132 (L) 03/12/2016   K 4.2 03/12/2016   CL 99 (L) 03/12/2016   CO2 25 03/12/2016   BUN 22 (H) 03/12/2016   CREATININE 1.34 (H) 03/12/2016   GLUCOSE 333 (H) 03/12/2016   ALT 14 03/12/2016    Lab Results  Component Value Date   CHOL 158 02/27/2016   HDL 55.50 02/27/2016   LDLCALC 70 02/27/2016   LDLDIRECT 178.7 10/02/2012   TRIG 161.0 (H) 02/27/2016   CHOLHDL 3 02/27/2016    --------------------------------------------------------------------------------------------------  ASSESSMENT AND PLAN: Cardiomyopathy with moderately reduced systolic LV function The patient has a history of reduced systolic function since at least 2015. She reports a stress test many years ago, though it does not appear that she has undergone ischemia evaluation since the diagnosis of her systolic dysfunction. She has minimal foot edema today but otherwise appears euvolemic with probable NYHA class II heart failure symptoms (assessment somewhat limited by decreased mobility from back and leg pain). Her only heart failure medication at this time is valsartan. We have discussed further evaluation and treatment options and have agreed to perform a pharmacologic myocardial perfusion stress test to evaluate for ischemia. We have also agreed to initiate carvedilol 3.125 mg twice a day, which we will plan to uptitrate as tolerated. We discussed low-sodium diet; I have encouraged the patient to monitor her weight and swelling. We will not start a standing diuretic today.  Mitral regurgitation Ms. Escajeda was noted to have moderate MR on echocardiogram earlier this  year by Dr. Nehemiah Massed. As above, she appears to be well compensated today. We will add carvedilol, as above.  Leg pain I suspect this is most likely musculoskeletal in nature. Slowly diminished pedal pulses are present raising the possibility for PVD. We have therefore agreed to proceed with bilateral lower extremity arterial Doppler and ABIs for further assessment. Most recent LDL was quite good at 70 in 02/2016.  Hypertension Blood pressure is suboptimally controlled today. We will add carvedilol and continue with current doses of amlodipine and valsartan. The patient was counseled regarding a low-sodium diet.  Follow-up: Return to clinic in 6 weeks.  Nelva Bush, MD 06/09/2016 10:15 AM

## 2016-06-10 ENCOUNTER — Ambulatory Visit: Payer: Medicare Other

## 2016-06-10 ENCOUNTER — Other Ambulatory Visit: Payer: Self-pay | Admitting: Internal Medicine

## 2016-06-10 DIAGNOSIS — M79604 Pain in right leg: Secondary | ICD-10-CM | POA: Diagnosis not present

## 2016-06-10 DIAGNOSIS — M79605 Pain in left leg: Secondary | ICD-10-CM | POA: Diagnosis not present

## 2016-06-10 DIAGNOSIS — I739 Peripheral vascular disease, unspecified: Secondary | ICD-10-CM | POA: Diagnosis not present

## 2016-06-10 DIAGNOSIS — I340 Nonrheumatic mitral (valve) insufficiency: Secondary | ICD-10-CM | POA: Insufficient documentation

## 2016-06-15 ENCOUNTER — Telehealth: Payer: Self-pay | Admitting: *Deleted

## 2016-06-15 NOTE — Telephone Encounter (Signed)
S/w with patient.  Reviewed arrival time for tomorrow 06/16/16 at 0715am to [facility] for nuclear stress test. Reviewed pre-procedural instructions as given on AVS from OV 06/09/16. She verbalized understanding.

## 2016-06-16 ENCOUNTER — Ambulatory Visit
Admission: RE | Admit: 2016-06-16 | Discharge: 2016-06-16 | Disposition: A | Discharge status: Home / Self Care | Payer: Medicare Other | Source: Ambulatory Visit | Attending: Internal Medicine | Admitting: Internal Medicine

## 2016-06-16 DIAGNOSIS — I255 Ischemic cardiomyopathy: Secondary | ICD-10-CM | POA: Insufficient documentation

## 2016-06-16 LAB — NM MYOCAR MULTI W/SPECT W/WALL MOTION / EF
CHL CUP STRESS STAGE 1 HR: 71 {beats}/min
CHL CUP STRESS STAGE 3 SPEED: 0 mph
CHL CUP STRESS STAGE 4 GRADE: 0 %
CHL CUP STRESS STAGE 4 HR: 100 {beats}/min
CHL CUP STRESS STAGE 4 SPEED: 0 mph
CHL CUP STRESS STAGE 5 DBP: 59 mmHg
CSEPPHR: 93 {beats}/min
CSEPPMHR: 64 %
Estimated workload: 1 METS
LV dias vol: 66 mL (ref 46–106)
LV sys vol: 32 mL
Percent HR: 68 %
Rest HR: 71 {beats}/min
Stage 1 Grade: 0 %
Stage 1 Speed: 0 mph
Stage 2 Grade: 0 %
Stage 2 HR: 71 {beats}/min
Stage 2 Speed: 0 mph
Stage 3 Grade: 0 %
Stage 3 HR: 93 {beats}/min
Stage 5 Grade: 0 %
Stage 5 HR: 96 {beats}/min
Stage 5 SBP: 157 mmHg
Stage 5 Speed: 0 mph
TID: 0.9

## 2016-06-16 MED ORDER — TECHNETIUM TC 99M TETROFOSMIN IV KIT
32.4400 | PACK | Freq: Once | INTRAVENOUS | Status: AC | PRN
  Administered 2016-06-16: 32.44 via INTRAVENOUS

## 2016-06-16 MED ORDER — REGADENOSON 0.4 MG/5ML IV SOLN
0.4000 mg | Freq: Once | INTRAVENOUS | Status: AC
  Administered 2016-06-16: 0.4 mg via INTRAVENOUS
  Filled 2016-06-16: qty 5

## 2016-06-16 MED ORDER — TECHNETIUM TC 99M TETROFOSMIN IV KIT
13.0000 | PACK | Freq: Once | INTRAVENOUS | Status: AC | PRN
  Administered 2016-06-16: 14.3 via INTRAVENOUS

## 2016-06-17 ENCOUNTER — Telehealth: Payer: Self-pay | Admitting: Internal Medicine

## 2016-06-17 NOTE — Telephone Encounter (Signed)
I spoke with the patient regarding the results of her stress test yesterday. There is no evidence of significant ischemia or scar. LVEF was low normal to mildly reduced and better compared to previous echoes. The patient reports that she feels well and is tolerating low-dose carvedilol. We will not make any medication changes at this time. The patient will follow-up as previously planned in January, 2018.  Nelva Bush, MD [facility] Pager: [phone]

## 2016-06-26 ENCOUNTER — Telehealth: Payer: Self-pay

## 2016-06-26 NOTE — Telephone Encounter (Signed)
Pt last eye exam that we have note from done in 2014 at [facility].   To call on Tuesday.

## 2016-06-30 NOTE — Telephone Encounter (Signed)
Received eye exam notes from [facility].   Results abstracted and sent to scan.   Forwarded to PCP as Juluis Rainier

## 2016-06-30 NOTE — Telephone Encounter (Signed)
Called patient and she has had an eye exam in 2017. She is going to call back when she gets home and get the exact date off the calender. Thanks.

## 2016-06-30 NOTE — Telephone Encounter (Signed)
[place] and left a message with Medical Record requesting records between 2016-2017 of pt eye exam.

## 2016-07-13 NOTE — Progress Notes (Signed)
Follow-up Outpatient Visit Date: 07/14/2016  Chief Complaint: Follow-up chronic systolic heart failure and mitral regurgitation  HPI:  Kelsey Mason is a 76 y.o. year-old female with history of  chronic systolic heart failure reportedly due to non-ischemic cardiomyopathy, mitral regurgitation, hypertension, hyperlipidemia, type 2 diabetes mellitus, and breast cancer, who presents for follow-up of heart failure and MR, as well as leg pain.  I last saw the patient on 06/09/16. Since that time, the patient has done well without chest pain or shortness of breath. She is not exercising regularly but does dancing (country [place]) several days a week. She does not have any limitations. She continues to have low back and leg pain that is unchanged.  At her last visit, we initiated carvedilol 3.125 mg twice a day given her history of cardiomyopathy with elevated blood pressure. The patient has been tolerating this well. She is not checking her blood pressures routinely at home.  --------------------------------------------------------------------------------------------------  Cardiovascular History & Procedures: Cardiovascular Problems:  Chronic systolic heart failure  Mitral regurgitation  Risk Factors:  HTN, HLD, DM2, and age > 58  Cath/PCI:  None  CV Surgery:  None  EP Procedures and Devices:  None  Non-Invasive Evaluation(s):  Pharmacologic myocardial perfusion stress test (06/16/16): Low risk study with moderate in size, moderate in severity, fixed defect involving the septum most likely related to LBBB. No evidence of ischemia. LVEF 50% by automated calculation and likely higher based on visual estimation.  ABIs (06/10/16): ABIs: Right not obtainable, left 1.4. TBIs right 1.1, left not obtainable. Bilateral great toe PPG's are normal. Bilateral common femoral, popliteal, peroneal, anterior tibial, and posterior tibial artery waveforms are brisk and triphasic.  TTE (02/23/16,  [facility]): Mildly dilated left ventricle with mild to moderate LV dysfunction (EF 35-45%). Grade 1 diastolic dysfunction. Moderate mitral regurgitation. Mild left atrial enlargement. Normal RV function.  TTE (01/01/14, [facility]): Moderate to severe LV dysfunction (EF 30%) with mild LVH and mild left ventricular dilation. Mitral annular calcification with moderate MR and moderate TR noted. Normal right ventricular contraction.  MUGA (12/25/12): Normal contraction and wall motion. EF 63%.  Recent CV Pertinent Labs: Lab Results  Component Value Date   CHOL 158 02/27/2016   HDL 55.50 02/27/2016   LDLCALC 70 02/27/2016   LDLDIRECT 178.7 10/02/2012   TRIG 161.0 (H) 02/27/2016   CHOLHDL 3 02/27/2016   K 4.2 03/12/2016   K 4.7 01/07/2014   BUN 22 (H) 03/12/2016   BUN 19 (H) 01/07/2014   CREATININE 1.34 (H) 03/12/2016   CREATININE 1.24 01/07/2014     Past medical and surgical history were reviewed and updated in EPIC.   Outpatient Encounter Prescriptions as of 07/14/2016  Medication Sig  . amLODipine (NORVASC) 5 MG tablet Take 1 tablet (5 mg total) by mouth daily.  [name] aspirin 81 MG tablet Take 81 mg by mouth daily.  . blood glucose meter kit and supplies KIT Dispense based on patient and insurance preference. Use up to four times daily as directed. One Touch Ultra mini meter (FOR ICD-9 250.00, 250.01). Dx: E11.9  . Calcium Carb-Cholecalciferol (SM CALCIUM/VITAMIN D) 600-800 MG-UNIT TABS Take 600-800 Units by mouth daily.  . carvedilol (COREG) 3.125 MG tablet Take 1 tablet (3.125 mg total) by mouth 2 (two) times daily.  [name] glimepiride (AMARYL) 2 MG tablet TAKE 1 TABLET (2 MG TOTAL) BY MOUTH 2 (TWO) TIMES DAILY.  [name] glucose blood (ONE TOUCH ULTRA TEST) test strip USE TO TEST BLOOD SUGAR UP TO 4 TIMES  DAILY  . Insulin Glargine (LANTUS) 100 UNIT/ML Solostar Pen Inject 20 Units into the skin daily at 10 pm.  . Insulin Pen Needle 32G X 6 MM MISC Use as directed with Lantus pen  .  letrozole (FEMARA) 2.5 MG tablet Take 1 tablet (2.5 mg total) by mouth daily.  . rosuvastatin (CRESTOR) 10 MG tablet TAKE 1 TABLET (10 MG TOTAL) BY MOUTH DAILY.  . saxagliptin HCl (ONGLYZA) 5 MG TABS tablet Take 0.5 tablets (2.5 mg total) by mouth daily.  . traMADol (ULTRAM) 50 MG tablet Take 1 tablet (50 mg total) by mouth every 8 (eight) hours as needed.  . valsartan (DIOVAN) 80 MG tablet Take 80 mg by mouth daily.    No facility-administered encounter medications on file as of 07/14/2016.     Allergies: Advil [ibuprofen]; Aleve [naproxen]; Fluticasone-salmeterol; Lipitor [atorvastatin]; and Penicillins  Social History   Social History  . Marital status: Widowed    Spouse name: N/A  . Number of children: N/A  . Years of education: N/A   Occupational History  . Not on file.   Social History Main Topics  . Smoking status: Former Smoker    Packs/day: 0.25    Years: 1.00    Types: Cigarettes    Quit date: 1963  . Smokeless tobacco: Never Used  . Alcohol use No  . Drug use: No  . Sexual activity: No   Other Topics Concern  . Not on file   Social History Narrative   Lives in [place].       Works - [employer]   Diet - regular   Exercise - dance 3x per week    Family History  Problem Relation Age of Onset  . Alcohol abuse Mother   . Heart disease Mother   . Hypertension Mother   . Alcohol abuse Father   . Heart disease Father   . Hypertension Father   . Heart disease Brother     Review of Systems: A 12-system review of systems was performed and was negative except as noted in the HPI.  --------------------------------------------------------------------------------------------------  Physical Exam: BP (!) 167/87 (BP Location: Left Arm, Patient Position: Sitting, Cuff Size: Normal)   Pulse 80   Ht 5' 2.5" (1.588 m)   Wt 169 lb 8 oz (76.9 kg)   BMI 30.51 kg/m   General:  Overweight woman, seated comfortably in the exam room. HEENT: No conjunctival  pallor or scleral icterus.  Moist mucous membranes.  OP clear. Neck: Supple without lymphadenopathy, thyromegaly, JVD, or HJR. Lungs: Normal work of breathing.  Clear to auscultation bilaterally without wheezes or crackles. Heart: Regular rate and rhythm with 1/6 holosystolic murmur at the left lower sternal border; no rubs or gallops..  Non-displaced PMI. Abd: Bowel sounds present.  Soft, NT/ND without hepatosplenomegaly Ext: No lower extremity edema.  2+ radial and dorsalis pedis pulses bilaterally. 1+ posterior tibial pulses bilaterally Skin: warm and dry without rash  Lab Results  Component Value Date   WBC 10.9 03/12/2016   HGB 12.0 03/12/2016   HCT 34.0 (L) 03/12/2016   MCV 85.8 03/12/2016   PLT 189 03/12/2016    Lab Results  Component Value Date   NA 132 (L) 03/12/2016   K 4.2 03/12/2016   CL 99 (L) 03/12/2016   CO2 25 03/12/2016   BUN 22 (H) 03/12/2016   CREATININE 1.34 (H) 03/12/2016   GLUCOSE 333 (H) 03/12/2016   ALT 14 03/12/2016    Lab Results  Component Value Date  CHOL 158 02/27/2016   HDL 55.50 02/27/2016   LDLCALC 70 02/27/2016   LDLDIRECT 178.7 10/02/2012   TRIG 161.0 (H) 02/27/2016   CHOLHDL 3 02/27/2016    --------------------------------------------------------------------------------------------------  ASSESSMENT AND PLAN: Nonischemic cardiomyopathy with chronic systolic heart failure The patient appears euvolemic and well compensated on exam with NYHA class II heart failure symptoms. Recent myocardial perfusion stress test showed no evidence of ischemia. Fixed septal defect most likely is related to her borderline left bundle branch block. LVEF was calculated at 50%, improved from prior echocardiogram reports. Given her uncontrolled blood pressure today, we have agreed to increase carvedilol to 6.25 mg twice a day. We will continue with valsartan and plan to repeat an echocardiogram when the patient returns to see me in about 6 months to reassess her  LV function and mixed valvular disease.  Mitral regurgitation As noted above, the patient was previously found to have moderate mitral regurgitation by echo. She appears euvolemic and well compensated today. We will continue to work on improved blood pressure control. We will repeat an echocardiogram in about 6 months.  Leg pain This is most likely muscular skeletal in nature; symptoms are not consistent with claudication. Recent vascular studies show evidence of arterial sclerosis but brisk waveforms in the inflow, outflow, and runoff vessels, making obstructive PVD unlikely. The patient should continue follow-up with her PCP for management of musculoskeletal issues.  Hypertension Blood pressure is quite elevated today. She attributes this to having not slept well last night. We have agreed to increase carvedilol to 6.25 mg twice a day. We will continue the remainder of her medications.  Follow-up: Return to clinic in 6 months.  Nelva Bush, MD 07/14/2016 10:50 AM

## 2016-07-14 ENCOUNTER — Ambulatory Visit (INDEPENDENT_AMBULATORY_CARE_PROVIDER_SITE_OTHER): Payer: Medicare Other | Admitting: Internal Medicine

## 2016-07-14 ENCOUNTER — Encounter: Payer: Self-pay | Admitting: Internal Medicine

## 2016-07-14 VITALS — BP 167/87 | HR 80 | Ht 62.5 in | Wt 169.5 lb

## 2016-07-14 DIAGNOSIS — M79604 Pain in right leg: Secondary | ICD-10-CM

## 2016-07-14 DIAGNOSIS — I340 Nonrheumatic mitral (valve) insufficiency: Secondary | ICD-10-CM | POA: Diagnosis not present

## 2016-07-14 DIAGNOSIS — I5022 Chronic systolic (congestive) heart failure: Secondary | ICD-10-CM

## 2016-07-14 DIAGNOSIS — I428 Other cardiomyopathies: Secondary | ICD-10-CM | POA: Diagnosis not present

## 2016-07-14 DIAGNOSIS — M79605 Pain in left leg: Secondary | ICD-10-CM

## 2016-07-14 DIAGNOSIS — I10 Essential (primary) hypertension: Secondary | ICD-10-CM

## 2016-07-14 MED ORDER — CARVEDILOL 6.25 MG PO TABS: 6.2500 mg | ORAL_TABLET | Freq: Two times a day (BID) | ORAL | 3 refills | Status: DC

## 2016-07-14 NOTE — Patient Instructions (Addendum)
Medication Instructions:  Your physician has recommended you make the following change in your medication:  1- INCREASE Carvedilol to 6.25mg  by mouth twice a day.   Labwork: - None ordered.   Testing/Procedures: Your physician has requested that you have an echocardiogram IN 6 MONTHS PRIOR TO APPT WITH DR END. Echocardiography is a painless test that uses sound waves to create images of your heart. It provides your doctor with information about the size and shape of your heart and how well your heart's chambers and valves are working. This procedure takes approximately one hour. There are no restrictions for this procedure.    Follow-Up: Your physician wants you to follow-up in: 6 MONTHS WITH DR END AFTER HAVING THE ECHO. You will receive a reminder letter in the mail two months in advance. If you don't receive a letter, please call our office to schedule the follow-up appointment.   If you need a refill on your cardiac medications before your next appointment, please call your pharmacy.  Echocardiogram An echocardiogram, or echocardiography, uses sound waves (ultrasound) to produce an image of your heart. The echocardiogram is simple, painless, obtained within a short period of time, and offers valuable information to your health care provider. The images from an echocardiogram can provide information such as:  Evidence of coronary artery disease (CAD).  Heart size.  Heart muscle function.  Heart valve function.  Aneurysm detection.  Evidence of a past heart attack.  Fluid buildup around the heart.  Heart muscle thickening.  Assess heart valve function. Tell a health care provider about:  Any allergies you have.  All medicines you are taking, including vitamins, herbs, eye drops, creams, and over-the-counter medicines.  Any problems you or family members have had with anesthetic medicines.  Any blood disorders you have.  Any surgeries you have had.  Any medical  conditions you have.  Whether you are pregnant or may be pregnant. What happens before the procedure? No special preparation is needed. Eat and drink normally. What happens during the procedure?  In order to produce an image of your heart, gel will be applied to your chest and a wand-like tool (transducer) will be moved over your chest. The gel will help transmit the sound waves from the transducer. The sound waves will harmlessly bounce off your heart to allow the heart images to be captured in real-time motion. These images will then be recorded.  You may need an IV to receive a medicine that improves the quality of the pictures. What happens after the procedure? You may return to your normal schedule including diet, activities, and medicines, unless your health care provider tells you otherwise. This information is not intended to replace advice given to you by your health care provider. Make sure you discuss any questions you have with your health care provider. Document Released: 06/18/2000 Document Revised: 02/07/2016 Document Reviewed: 02/26/2013 Elsevier Interactive Patient Education  2017 [employer].

## 2016-08-06 ENCOUNTER — Encounter: Payer: Self-pay | Admitting: Family Medicine

## 2016-09-08 ENCOUNTER — Encounter: Payer: Self-pay | Admitting: Radiation Oncology

## 2016-09-08 ENCOUNTER — Ambulatory Visit
Admission: RE | Admit: 2016-09-08 | Discharge: 2016-09-08 | Disposition: A | Discharge status: Home / Self Care | Payer: Medicare Other | Source: Ambulatory Visit | Attending: Radiation Oncology | Admitting: Radiation Oncology

## 2016-09-08 VITALS — BP 176/71 | HR 72 | Temp 98.3°F | Resp 20 | Wt 171.5 lb

## 2016-09-08 DIAGNOSIS — C50411 Malignant neoplasm of upper-outer quadrant of right female breast: Secondary | ICD-10-CM | POA: Diagnosis not present

## 2016-09-08 DIAGNOSIS — Z923 Personal history of irradiation: Secondary | ICD-10-CM | POA: Insufficient documentation

## 2016-09-08 DIAGNOSIS — Z170 Estrogen receptor positive status [ER+]: Secondary | ICD-10-CM | POA: Diagnosis not present

## 2016-09-08 DIAGNOSIS — Z79811 Long term (current) use of aromatase inhibitors: Secondary | ICD-10-CM | POA: Insufficient documentation

## 2016-09-08 DIAGNOSIS — C50312 Malignant neoplasm of lower-inner quadrant of left female breast: Secondary | ICD-10-CM | POA: Insufficient documentation

## 2016-09-08 NOTE — Progress Notes (Signed)
Radiation Oncology Follow up Note  Name: Kelsey Mason   Date:   09/08/2016 MRN:  [id number] DOB: [birth year]    This 76 y.o. female presents to the clinic today for follow-up for bilateral breast cancer.  REFERRING PROVIDER: Jackolyn Confer, MD  HPI: Patient is now out 3 years have been treated for bilateral breast cancer. She is seen today in routine follow-up and is doing well she specifically denies breast tenderness cough or bone pain.. She had bilateral diagnostic mammograms back in September 2017 both BI-RADS 2 benign. She's currently on Femara tolerating that well without side effect. She states she feels some firmness in her left axilla.  COMPLICATIONS OF TREATMENT: none  FOLLOW UP COMPLIANCE: keeps appointments   PHYSICAL EXAM:  BP (!) 176/71   Pulse 72   Temp 98.3 F (36.8 C)   Resp 20   Wt 171 lb 8.3 oz (77.8 kg)   BMI 30.87 kg/m  Lungs are clear to A&P cardiac examination essentially unremarkable with regular rate and rhythm. No dominant mass or nodularity is noted in either breast in 2 positions examined. Incision is well-healed. No axillary or supraclavicular adenopathy is appreciated. Cosmetic result is excellent. Area of concern her left axilla detect scar tissue no evidence of malignancy. Well-developed well-nourished patient in NAD. HEENT reveals PERLA, EOMI, discs not visualized.  Oral cavity is clear. No oral mucosal lesions are identified. Neck is clear without evidence of cervical or supraclavicular adenopathy. Lungs are clear to A&P. Cardiac examination is essentially unremarkable with regular rate and rhythm without murmur rub or thrill. Abdomen is benign with no organomegaly or masses noted. Motor sensory and DTR levels are equal and symmetric in the upper and lower extremities. Cranial nerves II through XII are grossly intact. Proprioception is intact. No peripheral adenopathy or edema is identified. No motor or sensory levels are noted. Crude visual  fields are within normal range.   RADIOLOGY RESULTS: Bilateral mammography is reviewed and compatible with the above-stated findings  PLAN: Present time patient is doing well with no evidence of disease. She continues on Femara without side effect. She is oriented been scheduled for follow-up mammograms. I've asked to see her back in 1 year for follow-up. She knows to call with any concerns.  I would like to take this opportunity to thank you for allowing me to participate in the care of your patient.Armstead Peaks., MD

## 2016-09-10 ENCOUNTER — Encounter: Payer: Self-pay | Admitting: Hematology and Oncology

## 2016-09-10 ENCOUNTER — Inpatient Hospital Stay: Payer: Medicare Other | Attending: Hematology and Oncology

## 2016-09-10 ENCOUNTER — Inpatient Hospital Stay (HOSPITAL_BASED_OUTPATIENT_CLINIC_OR_DEPARTMENT_OTHER): Payer: Medicare Other | Admitting: Hematology and Oncology

## 2016-09-10 VITALS — BP 138/81 | HR 83 | Temp 99.2°F | Resp 18 | Wt 171.5 lb

## 2016-09-10 DIAGNOSIS — E119 Type 2 diabetes mellitus without complications: Secondary | ICD-10-CM | POA: Diagnosis not present

## 2016-09-10 DIAGNOSIS — K219 Gastro-esophageal reflux disease without esophagitis: Secondary | ICD-10-CM | POA: Insufficient documentation

## 2016-09-10 DIAGNOSIS — D649 Anemia, unspecified: Secondary | ICD-10-CM

## 2016-09-10 DIAGNOSIS — Z794 Long term (current) use of insulin: Secondary | ICD-10-CM | POA: Diagnosis not present

## 2016-09-10 DIAGNOSIS — L298 Other pruritus: Secondary | ICD-10-CM | POA: Insufficient documentation

## 2016-09-10 DIAGNOSIS — I10 Essential (primary) hypertension: Secondary | ICD-10-CM

## 2016-09-10 DIAGNOSIS — M549 Dorsalgia, unspecified: Secondary | ICD-10-CM

## 2016-09-10 DIAGNOSIS — Z79811 Long term (current) use of aromatase inhibitors: Secondary | ICD-10-CM

## 2016-09-10 DIAGNOSIS — Z880 Allergy status to penicillin: Secondary | ICD-10-CM | POA: Diagnosis not present

## 2016-09-10 DIAGNOSIS — R5383 Other fatigue: Secondary | ICD-10-CM

## 2016-09-10 DIAGNOSIS — Z7982 Long term (current) use of aspirin: Secondary | ICD-10-CM | POA: Insufficient documentation

## 2016-09-10 DIAGNOSIS — Z79899 Other long term (current) drug therapy: Secondary | ICD-10-CM | POA: Insufficient documentation

## 2016-09-10 DIAGNOSIS — Z886 Allergy status to analgesic agent status: Secondary | ICD-10-CM | POA: Diagnosis not present

## 2016-09-10 DIAGNOSIS — C50312 Malignant neoplasm of lower-inner quadrant of left female breast: Secondary | ICD-10-CM

## 2016-09-10 DIAGNOSIS — C50912 Malignant neoplasm of unspecified site of left female breast: Secondary | ICD-10-CM

## 2016-09-10 DIAGNOSIS — Z87891 Personal history of nicotine dependence: Secondary | ICD-10-CM

## 2016-09-10 DIAGNOSIS — G629 Polyneuropathy, unspecified: Secondary | ICD-10-CM

## 2016-09-10 DIAGNOSIS — Z170 Estrogen receptor positive status [ER+]: Secondary | ICD-10-CM | POA: Diagnosis not present

## 2016-09-10 DIAGNOSIS — C50411 Malignant neoplasm of upper-outer quadrant of right female breast: Secondary | ICD-10-CM

## 2016-09-10 DIAGNOSIS — Z923 Personal history of irradiation: Secondary | ICD-10-CM | POA: Diagnosis not present

## 2016-09-10 DIAGNOSIS — C50911 Malignant neoplasm of unspecified site of right female breast: Secondary | ICD-10-CM

## 2016-09-10 DIAGNOSIS — K5909 Other constipation: Secondary | ICD-10-CM

## 2016-09-10 DIAGNOSIS — Z9221 Personal history of antineoplastic chemotherapy: Secondary | ICD-10-CM | POA: Insufficient documentation

## 2016-09-10 LAB — CBC WITH DIFFERENTIAL/PLATELET
Basophils Absolute: 0.1 10*3/uL (ref 0–0.1)
Basophils Relative: 1 %
Eosinophils Absolute: 1.1 10*3/uL — ABNORMAL HIGH (ref 0–0.7)
Eosinophils Relative: 10 %
HCT: 35.3 % (ref 35.0–47.0)
Hemoglobin: 12.3 g/dL (ref 12.0–16.0)
Lymphocytes Relative: 10 %
Lymphs Abs: 1.1 10*3/uL (ref 1.0–3.6)
MCH: 30.3 pg (ref 26.0–34.0)
MCHC: 34.7 g/dL (ref 32.0–36.0)
MCV: 87.2 fL (ref 80.0–100.0)
Monocytes Absolute: 0.5 10*3/uL (ref 0.2–0.9)
Monocytes Relative: 5 %
Neutro Abs: 8.6 10*3/uL — ABNORMAL HIGH (ref 1.4–6.5)
Neutrophils Relative %: 76 %
Platelets: 196 10*3/uL (ref 150–440)
RBC: 4.05 MIL/uL (ref 3.80–5.20)
RDW: 13.3 % (ref 11.5–14.5)
WBC: 11.3 10*3/uL — ABNORMAL HIGH (ref 3.6–11.0)

## 2016-09-10 LAB — COMPREHENSIVE METABOLIC PANEL
ALT: 19 U/L (ref 14–54)
AST: 17 U/L (ref 15–41)
Albumin: 4.3 g/dL (ref 3.5–5.0)
Alkaline Phosphatase: 93 U/L (ref 38–126)
Anion gap: 6 (ref 5–15)
BUN: 22 mg/dL — ABNORMAL HIGH (ref 6–20)
CO2: 28 mmol/L (ref 22–32)
Calcium: 9.3 mg/dL (ref 8.9–10.3)
Chloride: 99 mmol/L — ABNORMAL LOW (ref 101–111)
Creatinine, Ser: 1.37 mg/dL — ABNORMAL HIGH (ref 0.44–1.00)
GFR calc Af Amer: 43 mL/min — ABNORMAL LOW (ref >60)
GFR calc non Af Amer: 37 mL/min — ABNORMAL LOW (ref >60)
Glucose, Bld: 344 mg/dL — ABNORMAL HIGH (ref 65–99)
Potassium: 4 mmol/L (ref 3.5–5.1)
Sodium: 133 mmol/L — ABNORMAL LOW (ref 135–145)
Total Bilirubin: 0.6 mg/dL (ref 0.3–1.2)
Total Protein: 6.8 g/dL (ref 6.5–8.1)

## 2016-09-10 LAB — IRON AND TIBC
Iron: 54 ug/dL (ref 28–170)
Saturation Ratios: 18 % (ref 10.4–31.8)
TIBC: 297 ug/dL (ref 250–450)
UIBC: 243 ug/dL

## 2016-09-10 LAB — FERRITIN: Ferritin: 156 ng/mL (ref 11–307)

## 2016-09-10 NOTE — Progress Notes (Signed)
[facility] day:  09/10/2016  Chief Complaint: Kelsey Mason is a 76 y.o. female with bilateral stage IA breast cancer who is seen for 6 month assessment.  HPI: The patient was last seen in the medical oncology clinic on 03/12/2016.  At that time, she was seen for initial assessment.  She had a residual neuropathy.  Exam was unremarkable.  She had a mild normocytic anemia.  Hematocrit was 34.0.  Bilateral diagnostic mammogram on 03/22/2016 revealed no evidence of recurrent disease.  During the interim, she has done well.  She denies any breast concerns.  She has some chronic tingling in her calves (2 years).  She has left axillary itching.   Past Medical History:  Diagnosis Date  . Breast symptom 2014  . Cancer (HCC)    Bilateral Breast, chemo Dr. Jeb Levering  . Diabetes mellitus   . GERD (gastroesophageal reflux disease)    mild  . Hypertension   . Leakage of biological heart valve graft     Past Surgical History:  Procedure Laterality Date  . ABDOMINAL HYSTERECTOMY  1976   for pelvic pain  . BREAST EXCISIONAL BIOPSY Bilateral 11/2012   bilat breast ca chemo and rad  . BREAST SURGERY Bilateral 2014   bilateral lumpectomy and SN biopsy  . CHOLECYSTECTOMY  2007  . COLONOSCOPY WITH PROPOFOL N/A 10/13/2015   Procedure: COLONOSCOPY WITH PROPOFOL;  Surgeon: Manya Silvas, MD;  Location: [facility] ENDOSCOPY;  Service: Endoscopy;  Laterality: N/A;  . OOPHORECTOMY Right   . PORTACATH PLACEMENT  2014  . TUMOR REMOVAL Right 1980   right foot    Family History  Problem Relation Age of Onset  . Alcohol abuse Mother   . Heart disease Mother   . Hypertension Mother   . Alcohol abuse Father   . Heart disease Father   . Hypertension Father   . Heart disease Brother     Social History:  reports that she quit smoking about 55 years ago. Her smoking use included Cigarettes. She has a 0.25 pack-year smoking history. She has never used  smokeless tobacco. She reports that she does not drink alcohol or use drugs.  She likes to do line dancing.  She lives in [place].  The patient is alone today.  Allergies:  Allergies  Allergen Reactions  . Advil [Ibuprofen] Other (See Comments)    Dizziness   . Aleve [Naproxen] Other (See Comments)    Dizziness  . Fluticasone-Salmeterol Other (See Comments)    dizziness  . Lipitor [Atorvastatin] Other (See Comments)    Leg pain, muscle ache  . Penicillins Hives    Current Medications: Current Outpatient Prescriptions  Medication Sig Dispense Refill  . amLODipine (NORVASC) 5 MG tablet Take 1 tablet (5 mg total) by mouth daily. 90 tablet 3  . aspirin 81 MG tablet Take 81 mg by mouth daily.    . blood glucose meter kit and supplies KIT Dispense based on patient and insurance preference. Use up to four times daily as directed. One Touch Ultra mini meter (FOR ICD-9 250.00, 250.01). Dx: E11.9 1 each 0  . Calcium Carb-Cholecalciferol (SM CALCIUM/VITAMIN D) 600-800 MG-UNIT TABS Take 600-800 Units by mouth daily. 90 tablet 3  . carvedilol (COREG) 6.25 MG tablet Take 1 tablet (6.25 mg total) by mouth 2 (two) times daily. 180 tablet 3  . glimepiride (AMARYL) 2 MG tablet TAKE 1 TABLET (2 MG TOTAL) BY MOUTH 2 (TWO) TIMES DAILY. 180 tablet 1  .  glucose blood (ONE TOUCH ULTRA TEST) test strip USE TO TEST BLOOD SUGAR UP TO 4 TIMES DAILY 100 each 6  . Insulin Glargine (LANTUS) 100 UNIT/ML Solostar Pen Inject 20 Units into the skin daily at 10 pm. 3 mL 3  . Insulin Pen Needle 32G X 6 MM MISC Use as directed with Lantus pen 100 each 3  . letrozole (FEMARA) 2.5 MG tablet Take 1 tablet (2.5 mg total) by mouth daily. 90 tablet 3  . rosuvastatin (CRESTOR) 10 MG tablet TAKE 1 TABLET (10 MG TOTAL) BY MOUTH DAILY. 90 tablet 3  . saxagliptin HCl (ONGLYZA) 5 MG TABS tablet Take 0.5 tablets (2.5 mg total) by mouth daily. 30 tablet 11  . valsartan (DIOVAN) 80 MG tablet Take 80 mg by mouth daily.     . traMADol  (ULTRAM) 50 MG tablet Take 1 tablet (50 mg total) by mouth every 8 (eight) hours as needed. (Patient not taking: Reported on 09/10/2016) 30 tablet 0   No current facility-administered medications for this visit.     Review of Systems:  GENERAL:  Feels fatigued.  No fevers or sweats.  Weight up 5 pounds. PERFORMANCE STATUS (ECOG):  1 HEENT:  No visual changes, runny nose, sore throat, mouth sores or tenderness. Lungs: Cough with seasonal allergies and sinus drainage. No shortness of breath.  No hemoptysis. Cardiac:  No chest pain, palpitations, or orthopnea. GI:  Chronic constipation. No nausea, vomiting, diarrhea, melena or hematochezia. GU:  No urgency, frequency, dysuria, or hematuria. Musculoskeletal:  Back pain with standing.  No joint pain.  No muscle tenderness. Extremities:  Calf "tightness" with movement/dancing (? claudication).  No pain or swelling. Skin:  Left axilla pruritus. No rashes or skin changes. Neuro:  Pins and needles sensation in fingers and toes.  No headache, numbness or weakness, balance or coordination issues. Endocrine:  No diabetes, thyroid issues, hot flashes or night sweats. Psych:  No mood changes, depression or anxiety. Pain:  No focal pain. Review of systems:  All other systems reviewed and found to be negative.  Physical Exam: Blood pressure 138/81, pulse 83, temperature 99.2 F (37.3 C), temperature source Tympanic, resp. rate 18, weight 171 lb 8.3 oz (77.8 kg). GENERAL:  Well developed, well nourished, woman sitting comfortably in the exam room in no acute distress. MENTAL STATUS:  Alert and oriented to person, place and time. HEAD:  Wearing a black cap.  Short Rakan Soffer hair with slight graying.  Normocephalic, atraumatic, face symmetric, no Cushingoid features. EYES:  Blue eyes.  Pupils equal round and reactive to light and accomodation.  No conjunctivitis or scleral icterus. ENT:  Oropharynx clear without lesion.  Tongue normal. Mucous membranes moist.   RESPIRATORY:  Clear to auscultation without rales, wheezes or rhonchi. CARDIOVASCULAR:  Regular rate and rhythm without murmur, rub or gallop. ABDOMEN:  Soft, non-tender, with active bowel sounds, and no hepatosplenomegaly.  No masses. BREAST:  Right breast with upper outer semicircular incision.  No masses, skin changes or nipple discharge.  Left breast with medial longitudinal incision.  Fibrocystic changes inferiorly. No masses, skin changes or nipple discharge.  SKIN:  No rashes, ulcers or lesions. EXTREMITIES: No edema, no skin discoloration or tenderness.  No palpable cords. LYMPH NODES: No palpable cervical, supraclavicular, axillary or inguinal adenopathy  NEUROLOGICAL: Unremarkable. PSYCH:  Appropriate.   Appointment on 09/10/2016  Component Date Value Ref Range Status  . WBC 09/10/2016 11.3* 3.6 - 11.0 K/uL Final  . RBC 09/10/2016 4.05  3.80 - 5.20 MIL/uL  Final  . Hemoglobin 09/10/2016 12.3  12.0 - 16.0 g/dL Final  . HCT 09/10/2016 35.3  35.0 - 47.0 % Final  . MCV 09/10/2016 87.2  80.0 - 100.0 fL Final  . MCH 09/10/2016 30.3  26.0 - 34.0 pg Final  . MCHC 09/10/2016 34.7  32.0 - 36.0 g/dL Final  . RDW 09/10/2016 13.3  11.5 - 14.5 % Final  . Platelets 09/10/2016 196  150 - 440 K/uL Final  . Neutrophils Relative % 09/10/2016 76  % Final  . Neutro Abs 09/10/2016 8.6* 1.4 - 6.5 K/uL Final  . Lymphocytes Relative 09/10/2016 10  % Final  . Lymphs Abs 09/10/2016 1.1  1.0 - 3.6 K/uL Final  . Monocytes Relative 09/10/2016 5  % Final  . Monocytes Absolute 09/10/2016 0.5  0.2 - 0.9 K/uL Final  . Eosinophils Relative 09/10/2016 10  % Final  . Eosinophils Absolute 09/10/2016 1.1* 0 - 0.7 K/uL Final  . Basophils Relative 09/10/2016 1  % Final  . Basophils Absolute 09/10/2016 0.1  0 - 0.1 K/uL Final  . Sodium 09/10/2016 133* 135 - 145 mmol/L Final  . Potassium 09/10/2016 4.0  3.5 - 5.1 mmol/L Final  . Chloride 09/10/2016 99* 101 - 111 mmol/L Final  . CO2 09/10/2016 28  22 - 32 mmol/L  Final  . Glucose, Bld 09/10/2016 344* 65 - 99 mg/dL Final  . BUN 09/10/2016 22* 6 - 20 mg/dL Final  . Creatinine, Ser 09/10/2016 1.37* 0.44 - 1.00 mg/dL Final  . Calcium 09/10/2016 9.3  8.9 - 10.3 mg/dL Final  . Total Protein 09/10/2016 6.8  6.5 - 8.1 g/dL Final  . Albumin 09/10/2016 4.3  3.5 - 5.0 g/dL Final  . AST 09/10/2016 17  15 - 41 U/L Final  . ALT 09/10/2016 19  14 - 54 U/L Final  . Alkaline Phosphatase 09/10/2016 93  38 - 126 U/L Final  . Total Bilirubin 09/10/2016 0.6  0.3 - 1.2 mg/dL Final  . GFR calc non Af Amer 09/10/2016 37* >60 mL/min Final  . GFR calc Af Amer 09/10/2016 43* >60 mL/min Final   Comment: (NOTE) The eGFR has been calculated using the CKD EPI equation. This calculation has not been validated in all clinical situations. eGFR's persistently <60 mL/min signify possible Chronic Kidney Disease.   . Anion gap 09/10/2016 6  5 - 15 Final  . Ferritin 09/10/2016 156  11 - 307 ng/mL Final  . Iron 09/10/2016 54  28 - 170 ug/dL Final  . TIBC 09/10/2016 297  250 - 450 ug/dL Final  . Saturation Ratios 09/10/2016 18  10.4 - 31.8 % Final  . UIBC 09/10/2016 243  ug/dL Final    Assessment:  Kelsey Mason is a 76 y.o. female with bilateral breast cancer s/p lumpectomy and sentinel lymph node biopsy on 11/17/2012.  Right breast revealed a 5 mm grade I invasive carcinoma.  There was no DCIS.  Two sentinel lymph nodes were negative.  Pathologic stage was T1aN0M0.  Tumor was ER + (90%), PR + (20%), and Her2/neu -.   Left breast revealed a 1.8 cm grade III invasive ductal carcinoma.  There was lymphovascular invasion.  There was no DCIS.  One sentinel lymph node was negative.  Pathologic stage was T1cN0M0.  Tumor was ER + (>90%), PR + (1-5%), and Her2/neu -.  Oncotype DX testing on the left breast mass revealed a recurrence score of 28 which corresponded to a 10 year risk of distant recurrence of 19% (CI 15-23%)  with tamoxifen alone.  She received Adriamycin and  Cytoxan (AC) x 4 cycles followed by weekly Taxol x 10 (completed 06/05/2013).  She did not receive 2 cycles of Taxol secondary to progressive neuropathy.  She received bilateral breast radiation (07/2013 - 09/2013).  She began letrozole (Femara) in 09/2013.  Bilateral diagnostic mammogram on 03/22/2016 revealed  no evidence of recurrent disease.  CA27.29 has been followed:  24.4 on 09/10/2016.  Bone density study on 03/21/2015 was normal with a T score of -0.5 in L1-L4 and -0.8 in the left femur.  She has a borderline mild normocytic anemia.  Diet appears modest.  She denies any melena, hematochezia, hematuria or vaginal bleeding. Colonoscopy on 10/13/2015 was negative.    Symptomatically, she has a residual neuropathy.  Exam is stable.  Plan: 1.  Labs today: CBC with diff, CMP, CA27.29, ferritin, iron studies. 2.  Review interval mammogram. 3.  Discuss testing for BCI testing after 4-1/2 years (03/2018) to assess benefit of extended adjuvant hormonal therapy. 4.  Continue Femara. 5.  Schedule mammogram on 03/22/2017. 6.  Schedule bone density scan 03/22/2017. 7.  RTC in 6 months for MD assessment and labs (CBC with diff, CMP, CA27.29).  I saw and evaluated the patient, participating in the key portions of the service and reviewing pertinent diagnostic studies and records.  I reviewed the nurse practitioner's note and agree with the findings and the plan.  The assessment and plan were discussed with the patient.  A few questions were asked by the patient and answered.   Lucendia Herrlich, NP   Lequita Asal, MD  09/10/2016, 3:42 PM

## 2016-09-10 NOTE — Progress Notes (Signed)
Patient states she feels tight in her upper abdomen all the time.  States she eats too much because she is always hungry.  States she has leg tightness.  Cannot work in her flowers because when she squats down she cannot get up.  States she cannot walk any distance at all.  Patient dances a lot and states she is having problems with her legs when she dances.  Further states her fingers sometime draw downward toward her palms.  Having upper left arm pain.  Cannot extend it but so far.

## 2016-09-11 LAB — CANCER ANTIGEN 27.29: CA 27.29: 24.4 U/mL (ref 0.0–38.6)

## 2016-09-13 ENCOUNTER — Other Ambulatory Visit: Payer: Self-pay | Admitting: *Deleted

## 2016-09-13 DIAGNOSIS — C50419 Malignant neoplasm of upper-outer quadrant of unspecified female breast: Secondary | ICD-10-CM

## 2016-09-15 ENCOUNTER — Other Ambulatory Visit: Payer: Self-pay | Admitting: Internal Medicine

## 2016-09-17 ENCOUNTER — Other Ambulatory Visit: Payer: Self-pay | Admitting: Family Medicine

## 2016-09-17 NOTE — Telephone Encounter (Signed)
Refilled: 08/20/15 by Dr.Walker Last OV: 06/03/16 Last Labs: 06/03/16 Future OV: none Please advise?

## 2016-09-20 MED ORDER — SAXAGLIPTIN HCL 5 MG PO TABS: 2.5000 mg | ORAL_TABLET | Freq: Every day | ORAL | 11 refills | Status: DC

## 2016-09-23 ENCOUNTER — Other Ambulatory Visit: Payer: Self-pay | Admitting: Family Medicine

## 2016-09-23 MED ORDER — INSULIN GLARGINE 100 UNIT/ML SOLOSTAR PEN: 20.0000 [IU] | PEN_INJECTOR | Freq: Every day | SUBCUTANEOUS | 0 refills | Status: DC

## 2016-10-08 ENCOUNTER — Other Ambulatory Visit: Payer: Self-pay | Admitting: Family Medicine

## 2016-10-08 MED ORDER — AMLODIPINE BESYLATE 5 MG PO TABS: 5.0000 mg | ORAL_TABLET | Freq: Every day | ORAL | 3 refills | Status: DC

## 2016-10-26 ENCOUNTER — Other Ambulatory Visit: Payer: Self-pay | Admitting: Family Medicine

## 2016-11-09 ENCOUNTER — Other Ambulatory Visit: Payer: Self-pay | Admitting: Oncology

## 2016-11-09 DIAGNOSIS — C50411 Malignant neoplasm of upper-outer quadrant of right female breast: Secondary | ICD-10-CM

## 2016-11-16 ENCOUNTER — Ambulatory Visit (INDEPENDENT_AMBULATORY_CARE_PROVIDER_SITE_OTHER): Payer: Medicare Other | Admitting: Family Medicine

## 2016-11-16 ENCOUNTER — Encounter: Payer: Self-pay | Admitting: Family Medicine

## 2016-11-16 DIAGNOSIS — R1013 Epigastric pain: Secondary | ICD-10-CM | POA: Diagnosis not present

## 2016-11-16 DIAGNOSIS — G8929 Other chronic pain: Secondary | ICD-10-CM | POA: Diagnosis not present

## 2016-11-16 DIAGNOSIS — M25512 Pain in left shoulder: Secondary | ICD-10-CM

## 2016-11-16 MED ORDER — DICLOFENAC SODIUM 1 % TD GEL: 2.0000 g | Freq: Four times a day (QID) | TRANSDERMAL | 0 refills | Status: DC | PRN

## 2016-11-16 MED ORDER — PANTOPRAZOLE SODIUM 40 MG PO TBEC: 40.0000 mg | DELAYED_RELEASE_TABLET | Freq: Every day | ORAL | 3 refills | Status: DC

## 2016-11-16 NOTE — Patient Instructions (Signed)
Protonix as prescribed.  Try the voltaren.  Follow up in the next 1-3 months (especially regarding your Diabetes).  Take care  Dr. Lacinda Axon

## 2016-11-17 ENCOUNTER — Other Ambulatory Visit: Payer: Self-pay | Admitting: Family Medicine

## 2016-11-17 ENCOUNTER — Telehealth: Payer: Self-pay | Admitting: Family Medicine

## 2016-11-17 ENCOUNTER — Encounter: Payer: Self-pay | Admitting: Family Medicine

## 2016-11-17 DIAGNOSIS — R1013 Epigastric pain: Secondary | ICD-10-CM | POA: Insufficient documentation

## 2016-11-17 NOTE — Progress Notes (Signed)
Subjective:  Patient ID: Kelsey Mason, female    DOB: June 22, 1941  Age: 76 y.o. MRN: [id number]  CC: Abdominal pain, Arm/shoulder pain  HPI:  76 year old female with DM-2, CKD, Chronic systolic CHF Presents with the above complaint.  Abdominal pain  Patient reports epigastric pain.  She states it is been going on for the past 5 months.  Associated bloating and belching.  No nausea or vomiting.  Worse after eating.  No reports of heartburn.  No hematochezia or melena.  No known relieving factors.  Left arm/shoulder pain  This is a chronic problem.  Worsening.  Pain is located laterally at the deltoid region.  Decreased range of motion particularly in flexion.   She has taken tramadol without significant improvement.  No other associated symptoms.  No other complaints at this time.  Social Hx   Social History   Social History  . Marital status: Widowed    Spouse name: N/A  . Number of children: N/A  . Years of education: N/A   Social History Main Topics  . Smoking status: Former Smoker    Packs/day: 0.25    Years: 1.00    Types: Cigarettes    Quit date: 1963  . Smokeless tobacco: Never Used  . Alcohol use No  . Drug use: No  . Sexual activity: No   Other Topics Concern  . None   Social History Narrative   Lives in [place].       Works - [employer]   Diet - regular   Exercise - dance 3x per week    Review of Systems  Gastrointestinal: Positive for abdominal pain.  Musculoskeletal:       Shoulder/arm pain.   Objective:  BP 134/74   Pulse 89   Temp 98.2 F (36.8 C) (Oral)   Wt 171 lb 8 oz (77.8 kg)   SpO2 96%   BMI 30.87 kg/m   BP/Weight 11/16/2016 03/13/3569 07/11/7937  Systolic BP 030 092 330  Diastolic BP 74 81 71  Wt. (Lbs) 171.5 171.52 171.52  BMI 30.87 30.87 30.87    Physical Exam  Constitutional: She is oriented to person, place, and time. She appears well-developed. No distress.  Cardiovascular: Normal  rate and regular rhythm.   Pulmonary/Chest: Effort normal and breath sounds normal. She has no wheezes. She has no rales.  Abdominal: Soft.  Tender to palpation in epigastric region. Nondistended.  Musculoskeletal:  Shoulder: Inspection reveals no abnormalities, atrophy or asymmetry. Palpation is normal with no tenderness over AC joint or bicipital groove. ROM decreased in flexion. Rotator cuff strength 4/5 supraspinatus/infraspinatus/teres minor.  + Hawkins.   Neurological: She is alert and oriented to person, place, and time.  Psychiatric: She has a normal mood and affect.  Vitals reviewed.   Lab Results  Component Value Date   WBC 11.3 (H) 09/10/2016   HGB 12.3 09/10/2016   HCT 35.3 09/10/2016   PLT 196 09/10/2016   GLUCOSE 344 (H) 09/10/2016   CHOL 158 02/27/2016   TRIG 161.0 (H) 02/27/2016   HDL 55.50 02/27/2016   LDLDIRECT 178.7 10/02/2012   LDLCALC 70 02/27/2016   ALT 19 09/10/2016   AST 17 09/10/2016   NA 133 (L) 09/10/2016   K 4.0 09/10/2016   CL 99 (L) 09/10/2016   CREATININE 1.37 (H) 09/10/2016   BUN 22 (H) 09/10/2016   CO2 28 09/10/2016   TSH 1.71 02/14/2015   HGBA1C 7.9 (H) 06/03/2016   MICROALBUR 14.8 (H) 02/14/2015  Assessment & Plan:   Problem List Items Addressed This Visit    Left shoulder pain    Worsening. Appears to be coming from the rotator cuff. Trial of Voltaren gel as patient declined steroid injection and has CKD which precludes the use of oral anti-inflammatories.      Epigastric pain    New problem. Suspect gastritis versus peptic ulcer disease. Treating with PPI. If fails to improve, will need EGD.         Meds ordered this encounter  Medications  . diclofenac sodium (VOLTAREN) 1 % GEL    Sig: Apply 2 g topically 4 (four) times daily as needed.    Dispense:  100 g    Refill:  0  . pantoprazole (PROTONIX) 40 MG tablet    Sig: Take 1 tablet (40 mg total) by mouth daily.    Dispense:  30 tablet    Refill:  3      Follow-up: 1-3 months  [place] DO [facility]

## 2016-11-17 NOTE — Telephone Encounter (Signed)
PA was completed for Diclofenac sodium.

## 2016-11-17 NOTE — Assessment & Plan Note (Signed)
New problem. Suspect gastritis versus peptic ulcer disease. Treating with PPI. If fails to improve, will need EGD.

## 2016-11-17 NOTE — Assessment & Plan Note (Signed)
Worsening. Appears to be coming from the rotator cuff. Trial of Voltaren gel as patient declined steroid injection and has CKD which precludes the use of oral anti-inflammatories.

## 2016-12-08 ENCOUNTER — Other Ambulatory Visit: Payer: Self-pay | Admitting: Family Medicine

## 2016-12-14 ENCOUNTER — Other Ambulatory Visit: Payer: Self-pay | Admitting: Family Medicine

## 2016-12-15 ENCOUNTER — Other Ambulatory Visit: Payer: Self-pay

## 2016-12-15 MED ORDER — PANTOPRAZOLE SODIUM 40 MG PO TBEC: 40.0000 mg | DELAYED_RELEASE_TABLET | Freq: Every day | ORAL | 1 refills | Status: DC

## 2016-12-20 ENCOUNTER — Ambulatory Visit (INDEPENDENT_AMBULATORY_CARE_PROVIDER_SITE_OTHER): Payer: Medicare Other | Admitting: Family Medicine

## 2016-12-20 ENCOUNTER — Encounter: Payer: Self-pay | Admitting: Family Medicine

## 2016-12-20 VITALS — BP 138/64 | HR 71 | Temp 98.6°F | Resp 12 | Ht 63.0 in | Wt 173.2 lb

## 2016-12-20 DIAGNOSIS — E785 Hyperlipidemia, unspecified: Secondary | ICD-10-CM | POA: Diagnosis not present

## 2016-12-20 DIAGNOSIS — Z794 Long term (current) use of insulin: Secondary | ICD-10-CM

## 2016-12-20 DIAGNOSIS — N183 Chronic kidney disease, stage 3 unspecified: Secondary | ICD-10-CM

## 2016-12-20 DIAGNOSIS — E1122 Type 2 diabetes mellitus with diabetic chronic kidney disease: Secondary | ICD-10-CM | POA: Diagnosis not present

## 2016-12-20 DIAGNOSIS — I10 Essential (primary) hypertension: Secondary | ICD-10-CM

## 2016-12-20 LAB — LIPID PANEL
CHOLESTEROL: 149 mg/dL (ref 0–200)
HDL: 44.8 mg/dL (ref >39.00)
LDL CALC: 83 mg/dL (ref 0–99)
NONHDL: 104.38
Total CHOL/HDL Ratio: 3
Triglycerides: 105 mg/dL (ref 0.0–149.0)
VLDL: 21 mg/dL (ref 0.0–40.0)

## 2016-12-20 LAB — COMPREHENSIVE METABOLIC PANEL
ALK PHOS: 95 U/L (ref 39–117)
ALT: 17 U/L (ref 0–35)
AST: 13 U/L (ref 0–37)
Albumin: 4.4 g/dL (ref 3.5–5.2)
BUN: 15 mg/dL (ref 6–23)
CHLORIDE: 101 meq/L (ref 96–112)
CO2: 29 meq/L (ref 19–32)
Calcium: 9.7 mg/dL (ref 8.4–10.5)
Creatinine, Ser: 1.11 mg/dL (ref 0.40–1.20)
GFR: 50.82 mL/min — ABNORMAL LOW (ref >60.00)
GLUCOSE: 149 mg/dL — AB (ref 70–99)
POTASSIUM: 4.7 meq/L (ref 3.5–5.1)
SODIUM: 135 meq/L (ref 135–145)
TOTAL PROTEIN: 6.4 g/dL (ref 6.0–8.3)
Total Bilirubin: 0.5 mg/dL (ref 0.2–1.2)

## 2016-12-20 LAB — HEMOGLOBIN A1C: Hgb A1c MFr Bld: 9.3 % — ABNORMAL HIGH (ref 4.6–6.5)

## 2016-12-20 NOTE — Patient Instructions (Signed)
We will call with the lab results and with the referral.  Follow up in 6 months.  Take care  Dr. Lacinda Axon

## 2016-12-20 NOTE — Progress Notes (Signed)
Pre-visit discussion using our clinic review tool. No additional management support is needed unless otherwise documented below in the visit note.  

## 2016-12-20 NOTE — Assessment & Plan Note (Signed)
Uncontrolled. Labs today. Wants to see Endo. Placing referral. Continue Lantus, Amaryl, Onglyza.

## 2016-12-20 NOTE — Progress Notes (Signed)
Subjective:  Patient ID: Kelsey Mason, female    DOB: 05-Dec-1940  Age: 76 y.o. MRN: [id number]  CC: Follow up  HPI:  76 year old female with DM 2, CKD stage III, history of breast cancer, hypertension, hyperlipidemia, systolic CHF presents for follow-up.  DM-2  Uncontrolled.   Currently on Lantus 36 units, Onglyza, Amaryl.  Endorses compliance.   Fastings - 140-167.  Wants to see Endo.  HTN  Stable. Fairly well controlled on Diovan, Coreg, Norvasc.  HLD  Stable on Crestor. LDL 70.    Social Hx   Social History   Social History  . Marital status: Widowed    Spouse name: N/A  . Number of children: N/A  . Years of education: N/A   Social History Main Topics  . Smoking status: Former Smoker    Packs/day: 0.25    Years: 1.00    Types: Cigarettes    Quit date: 1963  . Smokeless tobacco: Never Used  . Alcohol use No  . Drug use: No  . Sexual activity: No   Other Topics Concern  . None   Social History Narrative   Lives in [place].       Works - [employer]   Diet - regular   Exercise - dance 3x per week    Review of Systems  Respiratory: Negative.   Cardiovascular: Negative.    Objective:  BP 138/64 (BP Location: Left Arm, Patient Position: Sitting, Cuff Size: Normal)   Pulse 71   Temp 98.6 F (37 C) (Oral)   Resp 12   Ht _0  (1.6 m)   Wt 173 lb 3.2 oz (78.6 kg)   SpO2 98%   BMI 30.68 kg/m   BP/Weight 12/20/2016 09/17/1759 6/0/7371  Systolic BP 062 694 854  Diastolic BP 64 74 81  Wt. (Lbs) 173.2 171.5 171.52  BMI 30.68 30.87 30.87   Physical Exam  Constitutional: She is oriented to person, place, and time. She appears well-developed. No distress.  Cardiovascular: Normal rate and regular rhythm.   Pulmonary/Chest: Effort normal and breath sounds normal. She has no wheezes. She has no rales.  Neurological: She is alert and oriented to person, place, and time.  Psychiatric: She has a normal mood and affect.  Vitals  reviewed.   Lab Results  Component Value Date   WBC 11.3 (H) 09/10/2016   HGB 12.3 09/10/2016   HCT 35.3 09/10/2016   PLT 196 09/10/2016   GLUCOSE 344 (H) 09/10/2016   CHOL 158 02/27/2016   TRIG 161.0 (H) 02/27/2016   HDL 55.50 02/27/2016   LDLDIRECT 178.7 10/02/2012   LDLCALC 70 02/27/2016   ALT 19 09/10/2016   AST 17 09/10/2016   NA 133 (L) 09/10/2016   K 4.0 09/10/2016   CL 99 (L) 09/10/2016   CREATININE 1.37 (H) 09/10/2016   BUN 22 (H) 09/10/2016   CO2 28 09/10/2016   TSH 1.71 02/14/2015   HGBA1C 7.9 (H) 06/03/2016   MICROALBUR 14.8 (H) 02/14/2015    Assessment & Plan:   Problem List Items Addressed This Visit    Type 2 diabetes mellitus with stage 3 chronic kidney disease, with long-term current use of insulin ([place]) - Primary    Uncontrolled. Labs today. Wants to see Endo. Placing referral. Continue Lantus, Amaryl, Onglyza.      Relevant Orders   Comp Met (CMET)   Hemoglobin A1c   Lipid panel   Ambulatory referral to Endocrinology   Hypertension (Chronic)    Stable. Continue  amlodipine, carvedilol, and Diovan.      Hyperlipidemia (Chronic)    Stable/at goal. Continue Crestor.        Follow-up: 6 months.  [place]

## 2016-12-20 NOTE — Assessment & Plan Note (Signed)
Stable/at goal. Continue Crestor.

## 2016-12-20 NOTE — Assessment & Plan Note (Signed)
Stable. Continue amlodipine, carvedilol, and Diovan.

## 2016-12-22 ENCOUNTER — Other Ambulatory Visit: Payer: Self-pay | Admitting: Family Medicine

## 2017-01-03 ENCOUNTER — Other Ambulatory Visit: Payer: Self-pay | Admitting: Family Medicine

## 2017-01-14 ENCOUNTER — Ambulatory Visit (INDEPENDENT_AMBULATORY_CARE_PROVIDER_SITE_OTHER): Payer: Medicare Other

## 2017-01-14 ENCOUNTER — Other Ambulatory Visit: Payer: Self-pay

## 2017-01-14 DIAGNOSIS — I5022 Chronic systolic (congestive) heart failure: Secondary | ICD-10-CM

## 2017-01-25 ENCOUNTER — Ambulatory Visit (INDEPENDENT_AMBULATORY_CARE_PROVIDER_SITE_OTHER): Payer: Medicare Other | Admitting: Internal Medicine

## 2017-01-25 ENCOUNTER — Encounter: Payer: Self-pay | Admitting: Internal Medicine

## 2017-01-25 VITALS — BP 138/62 | HR 82 | Ht 62.5 in | Wt 173.2 lb

## 2017-01-25 DIAGNOSIS — I5032 Chronic diastolic (congestive) heart failure: Secondary | ICD-10-CM | POA: Diagnosis not present

## 2017-01-25 DIAGNOSIS — I340 Nonrheumatic mitral (valve) insufficiency: Secondary | ICD-10-CM | POA: Diagnosis not present

## 2017-01-25 DIAGNOSIS — M79604 Pain in right leg: Secondary | ICD-10-CM | POA: Diagnosis not present

## 2017-01-25 DIAGNOSIS — Z79899 Other long term (current) drug therapy: Secondary | ICD-10-CM | POA: Diagnosis not present

## 2017-01-25 DIAGNOSIS — I428 Other cardiomyopathies: Secondary | ICD-10-CM

## 2017-01-25 DIAGNOSIS — I10 Essential (primary) hypertension: Secondary | ICD-10-CM

## 2017-01-25 DIAGNOSIS — M79605 Pain in left leg: Secondary | ICD-10-CM | POA: Diagnosis not present

## 2017-01-25 MED ORDER — LOSARTAN POTASSIUM 50 MG PO TABS: 50.0000 mg | ORAL_TABLET | Freq: Every day | ORAL | 3 refills | Status: DC

## 2017-01-25 NOTE — Patient Instructions (Signed)
Medication Instructions:  Your physician has recommended you make the following change in your medication:  1- STOP taking Rosuvastatin.  - Call our office in about 1 month to let us know if you're still having leg discomfort or not.  2- STOP taking Valsartan. 3- START taking Losartan 50 mg (1 tablet) by mouth once a day.   Labwork: Your physician recommends that you return for lab work in: 2 WEEKS (BMP) ON THE SAME DAY AS BLOOD PRESSURE CHECK HERE IN THE OFFICE.   Testing/Procedures: none  Follow-Up: Your physician recommends that you schedule a follow-up appointment in: 2 WEEKS FOR BP CHECK AND LABS (BMP).  Your physician recommends that you schedule a follow-up appointment in: 3 MONTHS WITH DR END.   If you need a refill on your cardiac medications before your next appointment, please call your pharmacy.

## 2017-01-25 NOTE — Progress Notes (Signed)
Follow-up Outpatient Visit Date: 01/25/2017  Chief Complaint: Leg pain  HPI:  Kelsey Mason is a 76 y.o. year-old female with history of  chronic systolic heart failure due to non-ischemic cardiomyopathy, mitral regurgitation, hypertension, hyperlipidemia, type 2 diabetes mellitus, and breast cancer, who presents for follow-up of heart failure and mitral regurgitation. I last saw her on 07/14/16, at which time she was doing well. Today, her only complaints are bilateral leg and chronic back pain. The leg pain primarily involves the calves and is described as a "tightness" that occurs when she does [place] line dancing. She denies leg pain otherwise but notes occasional ankle swelling, especially after having eaten popcorn. She denies chest pain, shortness of breath, palpitations, and lightheadedness. At times, she feels a little off balance, though. She has been compliant with her medications.  --------------------------------------------------------------------------------------------------  Cardiovascular History & Procedures: Cardiovascular Problems:  Non-ischemic cardiomyopathy with normalization of LVEF  Mitral regurgitation  Risk Factors:  HTN, HLD, DM2, and age > 60  Cath/PCI:  None  CV Surgery:  None  EP Procedures and Devices:  None  Non-Invasive Evaluation(s):  TTE (01/14/17): Normal LV size with LVEF 50-55%. Anteroseptal hypokinesis is noted, as well as grade 1 diastolic dysfunction. Mild to moderate MR. Normal RV size and function. Mild pulmonary hypertension.  Pharmacologic myocardial perfusion stress test (06/16/16): Low risk study with moderate in size, moderate in severity, fixed defect involving the septum most likely related to LBBB. No evidence of ischemia. LVEF 50% by automated calculation and likely higher based on visual estimation.  ABIs (06/10/16): ABIs: Right not obtainable, left 1.4. TBIs right 1.1, left not obtainable. Bilateral great toe PPG's  are normal. Bilateral common femoral, popliteal, peroneal, anterior tibial, and posterior tibial artery waveforms are brisk and triphasic.  TTE (02/23/16, [facility]): Mildly dilated left ventricle with mild to moderate LV dysfunction (EF 35-45%). Grade 1 diastolic dysfunction. Moderate mitral regurgitation. Mild left atrial enlargement. Normal RV function.  TTE (01/01/14, [facility]): Moderate to severe LV dysfunction (EF 30%) with mild LVH and mild left ventricular dilation. Mitral annular calcification with moderate MR and moderate TR noted. Normal right ventricular contraction.  MUGA (12/25/12): Normal contraction and wall motion. EF 63%.  Recent CV Pertinent Labs: Lab Results  Component Value Date   CHOL 149 12/20/2016   HDL 44.80 12/20/2016   LDLCALC 83 12/20/2016   LDLDIRECT 178.7 10/02/2012   TRIG 105.0 12/20/2016   CHOLHDL 3 12/20/2016   K 4.7 12/20/2016   K 4.7 01/07/2014   BUN 15 12/20/2016   BUN 19 (H) 01/07/2014   CREATININE 1.11 12/20/2016   CREATININE 1.24 01/07/2014     Past medical and surgical history were reviewed and updated in EPIC.   Outpatient Encounter Prescriptions as of 01/25/2017  Medication Sig  . amLODipine (NORVASC) 5 MG tablet Take 1 tablet (5 mg total) by mouth daily.  [name] aspirin 81 MG tablet Take 81 mg by mouth daily.  . blood glucose meter kit and supplies KIT Dispense based on patient and insurance preference. Use up to four times daily as directed. One Touch Ultra mini meter (FOR ICD-9 250.00, 250.01). Dx: E11.9  . Calcium Carb-Cholecalciferol (SM CALCIUM/VITAMIN D) 600-800 MG-UNIT TABS Take 600-800 Units by mouth daily.  . carvedilol (COREG) 6.25 MG tablet Take 1 tablet (6.25 mg total) by mouth 2 (two) times daily.  [name] glimepiride (AMARYL) 2 MG tablet TAKE 1 TABLET (2 MG TOTAL) BY MOUTH 2 (TWO) TIMES DAILY.  [name] glucose blood (ONE TOUCH  ULTRA TEST) test strip USE TO TEST BLOOD SUGAR UP TO 4 TIMES DAILY  . Insulin Pen Needle 32G X 6 MM MISC  Use as directed with Lantus pen  . LANTUS SOLOSTAR 100 UNIT/ML Solostar Pen INJECT 20 UNITS INTO THE SKIN DAILY AT 10 PM.  . letrozole (FEMARA) 2.5 MG tablet TAKE 1 TABLET (2.5 MG TOTAL) BY MOUTH DAILY.  . pantoprazole (PROTONIX) 40 MG tablet Take 1 tablet (40 mg total) by mouth daily.  . rosuvastatin (CRESTOR) 10 MG tablet TAKE 1 TABLET (10 MG TOTAL) BY MOUTH DAILY.  . saxagliptin HCl (ONGLYZA) 5 MG TABS tablet Take 0.5 tablets (2.5 mg total) by mouth daily.  . valsartan (DIOVAN) 80 MG tablet Take 80 mg by mouth daily.    No facility-administered encounter medications on file as of 01/25/2017.     Allergies: Advil [ibuprofen]; Aleve [naproxen]; Fluticasone-salmeterol; Lipitor [atorvastatin]; and Penicillins  Social History   Social History  . Marital status: Widowed    Spouse name: N/A  . Number of children: N/A  . Years of education: N/A   Occupational History  . Not on file.   Social History Main Topics  . Smoking status: Former Smoker    Packs/day: 0.25    Years: 1.00    Types: Cigarettes    Quit date: 1963  . Smokeless tobacco: Never Used  . Alcohol use No  . Drug use: No  . Sexual activity: No   Other Topics Concern  . Not on file   Social History Narrative   Lives in [place].       Works - [employer]   Diet - regular   Exercise - dance 3x per week    Family History  Problem Relation Age of Onset  . Alcohol abuse Mother   . Heart disease Mother   . Hypertension Mother   . Alcohol abuse Father   . Heart disease Father   . Hypertension Father   . Heart disease Brother     Review of Systems: A 12-system ROS was performed and was negative, except as noted in the HPI.  --------------------------------------------------------------------------------------------------  Physical Exam: BP 138/62 (BP Location: Left Arm, Patient Position: Sitting, Cuff Size: Normal)   Pulse 82   Ht 5' 2.5" (1.588 m)   Wt 173 lb 4 oz (78.6 kg)   BMI 31.18 kg/m    General:  Obese woman, seated comfortably in the exam room. HEENT: No conjunctival pallor or scleral icterus.  Moist mucous membranes.  OP clear. Neck: Supple without lymphadenopathy, thyromegaly, JVD, or HJR.  No carotid bruit. Lungs: Normal work of breathing.  Clear to auscultation bilaterally without wheezes or crackles. Heart: Regular rate and rhythm 2/6 holosystolic murmur loudest at the LLSB. No rubs or gallops.  Non-displaced PMI. Abd: Bowel sounds present.  Soft, NT/ND without hepatosplenomegaly Ext: Trace pretibial edema bilaterally.  Radial, PT, and DP pulses are 2+ bilaterally. Skin: Warm and dry without rash.   Lab Results  Component Value Date   WBC 11.3 (H) 09/10/2016   HGB 12.3 09/10/2016   HCT 35.3 09/10/2016   MCV 87.2 09/10/2016   PLT 196 09/10/2016    Lab Results  Component Value Date   NA 135 12/20/2016   K 4.7 12/20/2016   CL 101 12/20/2016   CO2 29 12/20/2016   BUN 15 12/20/2016   CREATININE 1.11 12/20/2016   GLUCOSE 149 (H) 12/20/2016   ALT 17 12/20/2016    Lab Results  Component Value Date  CHOL 149 12/20/2016   HDL 44.80 12/20/2016   LDLCALC 83 12/20/2016   LDLDIRECT 178.7 10/02/2012   TRIG 105.0 12/20/2016   CHOLHDL 3 12/20/2016    --------------------------------------------------------------------------------------------------  ASSESSMENT AND PLAN: Chronic diastolic heart failure and non-ischemic cardiomyopathy Kelsey Mason appears euvolemic with the exception of trace ankle edema. Her symptoms are stable (NYHA class I-II). Recent echo shows low-normal LVEF. I encouraged Kelsey Mason to avoid salt. Due to recall on valsartan and borderline elevated blood pressure, we will switch valsartan to losartan 50 mg daily and recheck her BP and a BMP in ~2 weeks.  Mitral regurgitation Mild to moderate MR noted on recent echo. Continue with medical therapy, including afterload reduction and maintenance of euvolemia.  Leg pain Symptoms occur only  when dancing. Pedal pulses are palpable on exam today and ABIs last year were normal. We have agreed to a statin holiday to see if this helps with her pain. I asked Kelsey Mason to call our office in ~1 month to report on her pain after holding rosuvastatin.  Hypertension As above, BP is borderline elevated today. We will switch valsartan 80 mg daily to losartan 50 mg daily No other medication changes today.  Follow-up: Return to clinic in 3 months.  Nelva Bush, MD 01/25/2017 3:06 PM

## 2017-01-27 ENCOUNTER — Other Ambulatory Visit: Payer: Self-pay

## 2017-01-27 DIAGNOSIS — C50411 Malignant neoplasm of upper-outer quadrant of right female breast: Secondary | ICD-10-CM

## 2017-01-27 DIAGNOSIS — Z170 Estrogen receptor positive status [ER+]: Principal | ICD-10-CM

## 2017-01-27 DIAGNOSIS — C50312 Malignant neoplasm of lower-inner quadrant of left female breast: Secondary | ICD-10-CM

## 2017-02-08 ENCOUNTER — Ambulatory Visit (INDEPENDENT_AMBULATORY_CARE_PROVIDER_SITE_OTHER): Payer: Medicare Other | Admitting: *Deleted

## 2017-02-08 ENCOUNTER — Other Ambulatory Visit: Payer: Medicare Other

## 2017-02-08 VITALS — BP 164/73 | HR 71 | Resp 18 | Ht 62.5 in | Wt 176.5 lb

## 2017-02-08 DIAGNOSIS — I10 Essential (primary) hypertension: Secondary | ICD-10-CM | POA: Diagnosis not present

## 2017-02-08 DIAGNOSIS — Z79899 Other long term (current) drug therapy: Secondary | ICD-10-CM

## 2017-02-08 DIAGNOSIS — I5032 Chronic diastolic (congestive) heart failure: Secondary | ICD-10-CM

## 2017-02-08 NOTE — Patient Instructions (Addendum)
We will call you with further recommendations related to your blood pressure check.  Your physician has requested that you regularly monitor and record your blood pressure readings at home. Please use the same machine at the same time of day to check your readings and record them to bring to your follow-up visit. Please keep a log of your blood pressure readings. If blood pressure consistently remains greater than 140/80 please give our office a call.    It was a pleasure seeing you today here in the office. Please do not hesitate to give [place] a call back if you have any further questions. Archer, BSN

## 2017-02-08 NOTE — Progress Notes (Signed)
1.) Reason for visit: Blood pressure check  2.) Name of MD requesting visit: Dr. Saunders Revel  3.) H&P: Diastolic heart failure, non-ischemic cardiomyopathy, Mitral regurgitation, Hypertension  4.) ROS related to problem: Patient switched from valsartan to losartan.  5.) Assessment and plan per MD: Message routed to Dr. Saunders Revel for review and recommendations.   BP   Left arm 170/76  Right arm 166/80

## 2017-02-09 ENCOUNTER — Other Ambulatory Visit: Payer: Self-pay | Admitting: *Deleted

## 2017-02-09 DIAGNOSIS — I10 Essential (primary) hypertension: Secondary | ICD-10-CM

## 2017-02-09 DIAGNOSIS — Z79899 Other long term (current) drug therapy: Secondary | ICD-10-CM

## 2017-02-09 LAB — BASIC METABOLIC PANEL
BUN / CREAT RATIO: 12 (ref 12–28)
BUN: 12 mg/dL (ref 8–27)
CALCIUM: 9.5 mg/dL (ref 8.7–10.3)
CHLORIDE: 98 mmol/L (ref 96–106)
CO2: 21 mmol/L (ref 20–29)
Creatinine, Ser: 0.97 mg/dL (ref 0.57–1.00)
GFR calc Af Amer: 66 mL/min/{1.73_m2} (ref >59)
GFR calc non Af Amer: 57 mL/min/{1.73_m2} — ABNORMAL LOW (ref >59)
GLUCOSE: 206 mg/dL — AB (ref 65–99)
Potassium: 4.6 mmol/L (ref 3.5–5.2)
Sodium: 139 mmol/L (ref 134–144)

## 2017-02-09 MED ORDER — LOSARTAN POTASSIUM 50 MG PO TABS: 100.0000 mg | ORAL_TABLET | Freq: Every day | ORAL | 3 refills | Status: DC

## 2017-02-28 ENCOUNTER — Telehealth: Payer: Self-pay

## 2017-02-28 ENCOUNTER — Ambulatory Visit (INDEPENDENT_AMBULATORY_CARE_PROVIDER_SITE_OTHER): Payer: Medicare Other

## 2017-02-28 ENCOUNTER — Other Ambulatory Visit (INDEPENDENT_AMBULATORY_CARE_PROVIDER_SITE_OTHER): Payer: Medicare Other

## 2017-02-28 ENCOUNTER — Other Ambulatory Visit: Payer: Self-pay

## 2017-02-28 VITALS — BP 170/80 | HR 95 | Resp 16 | Ht 62.0 in | Wt 175.8 lb

## 2017-02-28 DIAGNOSIS — I10 Essential (primary) hypertension: Secondary | ICD-10-CM

## 2017-02-28 DIAGNOSIS — I5022 Chronic systolic (congestive) heart failure: Secondary | ICD-10-CM

## 2017-02-28 DIAGNOSIS — Z79899 Other long term (current) drug therapy: Secondary | ICD-10-CM

## 2017-02-28 MED ORDER — HYDROCHLOROTHIAZIDE 25 MG PO TABS: 25.0000 mg | ORAL_TABLET | Freq: Every day | ORAL | 3 refills | Status: DC

## 2017-02-28 NOTE — Progress Notes (Signed)
Blood pressure remains suboptimally controlled today. Please have Ms. Keleher start HCTZ 25 mg daily and some back in 1 month for BMP and BP check. Thanks.  Nelva Bush, MD [facility] Pager: [phone]

## 2017-02-28 NOTE — Telephone Encounter (Addendum)
-----   Message from Nelva Bush, MD sent at 02/28/2017 11:48 AM EDT -----  Blood pressure remains suboptimally controlled today. Please have Ms. Stahnke start HCTZ 25 mg daily and some back in 1 month for BMP and BP check. Thanks. ----- Message ----- From: Georgiana Shore, RN Sent: 02/28/2017   8:57 AM To: Nelva Bush, MD  Left message on machine for patient to contact the office.

## 2017-02-28 NOTE — Telephone Encounter (Signed)
Reviewed MD recommendations w/pt who is agreeable w/plan. Pt placed on hold for scheduling. No one available. Will route to scheduling for BMET and nurse visit for BP check in one month.

## 2017-02-28 NOTE — Telephone Encounter (Signed)
Patient returning call .    [phone]

## 2017-02-28 NOTE — Patient Instructions (Signed)
1.) Reason for visit: BP check  2.) Name of MD requesting visit: Dr. Saunders Revel  3). H&P: Pt w/hx of HTN. Valsartan switched to losartan 50mg  qd at July 24 OV. BP still elevated at 8/6 OV.  It appears that losartan was increased to 100mg  qd on 02/09/17.    4). ROS related to problem: Pt BP continues to be elevated today. Left arm: 170/80 Right arm: 172/84. She was running late for her appt today; did not have time to eat breakfast. She takes her morning meds with food therefore, she had not taken them when we checked her BP.  She did not realize she was suppose to take losartan 100mg  qd and has been only taking 50mg  qd.  She is also unsure if she has been taking coreg once or twice a day.  I reviewed medication list with patient. She will check her pill box once she goes home.    5.) Assessment and plan per MD: I offered pt crackers so she could take her BP medications now but she declined, stating she would get some breakfast after leaving here. Encouraged her to eat now as she is diabetic and so she would take her BP meds as pressure is too high.  Pt verbalized understanding. She understands we will call her back with further MD recommendations.   Pt brought list of home BP checks. Scanned into her chart for MD review.

## 2017-03-01 LAB — BASIC METABOLIC PANEL
BUN/Creatinine Ratio: 14 (ref 12–28)
BUN: 15 mg/dL (ref 8–27)
CALCIUM: 9.7 mg/dL (ref 8.7–10.3)
CHLORIDE: 97 mmol/L (ref 96–106)
CO2: 27 mmol/L (ref 20–29)
Creatinine, Ser: 1.08 mg/dL — ABNORMAL HIGH (ref 0.57–1.00)
GFR calc non Af Amer: 50 mL/min/{1.73_m2} — ABNORMAL LOW (ref >59)
GFR, EST AFRICAN AMERICAN: 58 mL/min/{1.73_m2} — AB (ref >59)
GLUCOSE: 203 mg/dL — AB (ref 65–99)
POTASSIUM: 4.4 mmol/L (ref 3.5–5.2)
Sodium: 137 mmol/L (ref 134–144)

## 2017-03-01 NOTE — Telephone Encounter (Signed)
Patient called back and was scheduled for BP check and lab work in 1 month.  Results from lab work from yesterday also given and she verbalized understanding.

## 2017-03-14 ENCOUNTER — Inpatient Hospital Stay (HOSPITAL_BASED_OUTPATIENT_CLINIC_OR_DEPARTMENT_OTHER): Payer: Medicare Other | Admitting: Hematology and Oncology

## 2017-03-14 ENCOUNTER — Encounter: Payer: Self-pay | Admitting: Hematology and Oncology

## 2017-03-14 ENCOUNTER — Other Ambulatory Visit: Payer: Self-pay | Admitting: *Deleted

## 2017-03-14 ENCOUNTER — Inpatient Hospital Stay: Payer: Medicare Other | Attending: Hematology and Oncology

## 2017-03-14 VITALS — BP 153/75 | HR 87 | Temp 98.1°F | Resp 18 | Wt 174.0 lb

## 2017-03-14 DIAGNOSIS — G4700 Insomnia, unspecified: Secondary | ICD-10-CM | POA: Diagnosis not present

## 2017-03-14 DIAGNOSIS — Z794 Long term (current) use of insulin: Secondary | ICD-10-CM | POA: Insufficient documentation

## 2017-03-14 DIAGNOSIS — K219 Gastro-esophageal reflux disease without esophagitis: Secondary | ICD-10-CM

## 2017-03-14 DIAGNOSIS — Z170 Estrogen receptor positive status [ER+]: Secondary | ICD-10-CM | POA: Diagnosis not present

## 2017-03-14 DIAGNOSIS — R590 Localized enlarged lymph nodes: Secondary | ICD-10-CM

## 2017-03-14 DIAGNOSIS — E119 Type 2 diabetes mellitus without complications: Secondary | ICD-10-CM | POA: Diagnosis not present

## 2017-03-14 DIAGNOSIS — D649 Anemia, unspecified: Secondary | ICD-10-CM | POA: Diagnosis not present

## 2017-03-14 DIAGNOSIS — K5909 Other constipation: Secondary | ICD-10-CM

## 2017-03-14 DIAGNOSIS — M545 Low back pain: Secondary | ICD-10-CM | POA: Diagnosis not present

## 2017-03-14 DIAGNOSIS — C50911 Malignant neoplasm of unspecified site of right female breast: Secondary | ICD-10-CM | POA: Insufficient documentation

## 2017-03-14 DIAGNOSIS — G620 Drug-induced polyneuropathy: Secondary | ICD-10-CM

## 2017-03-14 DIAGNOSIS — R5383 Other fatigue: Secondary | ICD-10-CM

## 2017-03-14 DIAGNOSIS — Z79899 Other long term (current) drug therapy: Secondary | ICD-10-CM

## 2017-03-14 DIAGNOSIS — Z9221 Personal history of antineoplastic chemotherapy: Secondary | ICD-10-CM

## 2017-03-14 DIAGNOSIS — Z79811 Long term (current) use of aromatase inhibitors: Secondary | ICD-10-CM | POA: Diagnosis not present

## 2017-03-14 DIAGNOSIS — C50912 Malignant neoplasm of unspecified site of left female breast: Secondary | ICD-10-CM

## 2017-03-14 DIAGNOSIS — Z886 Allergy status to analgesic agent status: Secondary | ICD-10-CM | POA: Diagnosis not present

## 2017-03-14 DIAGNOSIS — E1165 Type 2 diabetes mellitus with hyperglycemia: Secondary | ICD-10-CM | POA: Diagnosis not present

## 2017-03-14 DIAGNOSIS — I10 Essential (primary) hypertension: Secondary | ICD-10-CM | POA: Insufficient documentation

## 2017-03-14 DIAGNOSIS — Z7982 Long term (current) use of aspirin: Secondary | ICD-10-CM | POA: Diagnosis not present

## 2017-03-14 DIAGNOSIS — Z853 Personal history of malignant neoplasm of breast: Secondary | ICD-10-CM

## 2017-03-14 DIAGNOSIS — Z87891 Personal history of nicotine dependence: Secondary | ICD-10-CM

## 2017-03-14 DIAGNOSIS — R599 Enlarged lymph nodes, unspecified: Secondary | ICD-10-CM

## 2017-03-14 DIAGNOSIS — C50419 Malignant neoplasm of upper-outer quadrant of unspecified female breast: Secondary | ICD-10-CM

## 2017-03-14 DIAGNOSIS — Z880 Allergy status to penicillin: Secondary | ICD-10-CM | POA: Insufficient documentation

## 2017-03-14 DIAGNOSIS — H9190 Unspecified hearing loss, unspecified ear: Secondary | ICD-10-CM | POA: Diagnosis not present

## 2017-03-14 DIAGNOSIS — R591 Generalized enlarged lymph nodes: Secondary | ICD-10-CM

## 2017-03-14 DIAGNOSIS — T451X5A Adverse effect of antineoplastic and immunosuppressive drugs, initial encounter: Secondary | ICD-10-CM

## 2017-03-14 DIAGNOSIS — Z923 Personal history of irradiation: Secondary | ICD-10-CM | POA: Diagnosis not present

## 2017-03-14 DIAGNOSIS — C50312 Malignant neoplasm of lower-inner quadrant of left female breast: Secondary | ICD-10-CM

## 2017-03-14 LAB — CBC WITH DIFFERENTIAL/PLATELET
Basophils Absolute: 0.1 10*3/uL (ref 0–0.1)
Basophils Relative: 1 %
Eosinophils Absolute: 1 10*3/uL — ABNORMAL HIGH (ref 0–0.7)
Eosinophils Relative: 10 %
HCT: 37.4 % (ref 35.0–47.0)
Hemoglobin: 13.1 g/dL (ref 12.0–16.0)
Lymphocytes Relative: 14 %
Lymphs Abs: 1.4 10*3/uL (ref 1.0–3.6)
MCH: 30 pg (ref 26.0–34.0)
MCHC: 35 g/dL (ref 32.0–36.0)
MCV: 85.7 fL (ref 80.0–100.0)
Monocytes Absolute: 0.5 10*3/uL (ref 0.2–0.9)
Monocytes Relative: 6 %
Neutro Abs: 7 10*3/uL — ABNORMAL HIGH (ref 1.4–6.5)
Neutrophils Relative %: 69 %
Platelets: 233 10*3/uL (ref 150–440)
RBC: 4.37 MIL/uL (ref 3.80–5.20)
RDW: 13.4 % (ref 11.5–14.5)
WBC: 9.9 10*3/uL (ref 3.6–11.0)

## 2017-03-14 LAB — COMPREHENSIVE METABOLIC PANEL
ALT: 21 U/L (ref 14–54)
AST: 19 U/L (ref 15–41)
Albumin: 4.4 g/dL (ref 3.5–5.0)
Alkaline Phosphatase: 111 U/L (ref 38–126)
Anion gap: 11 (ref 5–15)
BUN: 24 mg/dL — ABNORMAL HIGH (ref 6–20)
CO2: 27 mmol/L (ref 22–32)
Calcium: 9.4 mg/dL (ref 8.9–10.3)
Chloride: 92 mmol/L — ABNORMAL LOW (ref 101–111)
Creatinine, Ser: 1.43 mg/dL — ABNORMAL HIGH (ref 0.44–1.00)
GFR calc Af Amer: 40 mL/min — ABNORMAL LOW (ref >60)
GFR calc non Af Amer: 35 mL/min — ABNORMAL LOW (ref >60)
Glucose, Bld: 342 mg/dL — ABNORMAL HIGH (ref 65–99)
Potassium: 3.8 mmol/L (ref 3.5–5.1)
Sodium: 130 mmol/L — ABNORMAL LOW (ref 135–145)
Total Bilirubin: 0.6 mg/dL (ref 0.3–1.2)
Total Protein: 6.8 g/dL (ref 6.5–8.1)

## 2017-03-14 NOTE — Progress Notes (Signed)
Patient has noticed a know on the left side of her neck for the past 2.5 weeks and seems that it may be enlarging.

## 2017-03-14 NOTE — Progress Notes (Signed)
[facility] day:  03/14/2017  Chief Complaint: Kelsey Mason is a 76 y.o. female with bilateral stage IA breast cancer who is seen for 6 month assessment.  HPI: The patient was last seen in the medical oncology clinic on 09/10/2016.  At that time, she was doing well.  She denied any breast concerns.  She had some chronic tingling in her calves (2 years) and left axillary itching.  Exam was unremarkable.  CBC, CMP, and CA27.29 were normal.  Creatinine was 1.37.  Ferritin and iron studies were normal.  During the interim, patient doing "alright". Patient with fatigue and lower back pain. She is having difficulties with her sleep. Previously reporting tingling in her legs and axillary itching has resolved. Patient denies fever, sweats, weight loss, and areas of unexplained bruising. Patient has multiple medical appointments scheduled this week. Patient with pain to her LEFT side of her neck; describes it as a "knot". Patient continues on Femara with no perceived side effects.    Past Medical History:  Diagnosis Date  . Breast symptom 2014  . Cancer (HCC)    Bilateral Breast, chemo Dr. Jeb Levering  . Diabetes mellitus   . GERD (gastroesophageal reflux disease)    mild  . Hypertension     Past Surgical History:  Procedure Laterality Date  . ABDOMINAL HYSTERECTOMY  1976   for pelvic pain  . BREAST EXCISIONAL BIOPSY Bilateral 11/2012   bilat breast ca chemo and rad  . BREAST SURGERY Bilateral 2014   bilateral lumpectomy and SN biopsy  . CHOLECYSTECTOMY  2007  . COLONOSCOPY WITH PROPOFOL N/A 10/13/2015   Procedure: COLONOSCOPY WITH PROPOFOL;  Surgeon: Manya Silvas, MD;  Location: [facility] ENDOSCOPY;  Service: Endoscopy;  Laterality: N/A;  . OOPHORECTOMY Right   . PORTACATH PLACEMENT  2014  . TUMOR REMOVAL Right 1980   right foot    Family History  Problem Relation Age of Onset  . Alcohol abuse Mother   . Heart disease Mother   .  Hypertension Mother   . Alcohol abuse Father   . Heart disease Father   . Hypertension Father   . Heart disease Brother     Social History:  reports that she quit smoking about 55 years ago. Her smoking use included Cigarettes. She has a 0.25 pack-year smoking history. She has never used smokeless tobacco. She reports that she does not drink alcohol or use drugs.  She likes to do line dancing.  She lives in [place].  The patient is alone today.  Allergies:  Allergies  Allergen Reactions  . Advil [Ibuprofen] Other (See Comments)    Dizziness   . Aleve [Naproxen] Other (See Comments)    Dizziness  . Fluticasone-Salmeterol Other (See Comments)    dizziness  . Lipitor [Atorvastatin] Other (See Comments)    Leg pain, muscle ache  . Penicillins Hives    Current Medications: Current Outpatient Prescriptions  Medication Sig Dispense Refill  . amLODipine (NORVASC) 5 MG tablet Take 1 tablet (5 mg total) by mouth daily. 90 tablet 3  . aspirin 81 MG tablet Take 81 mg by mouth daily.    . blood glucose meter kit and supplies KIT Dispense based on patient and insurance preference. Use up to four times daily as directed. One Touch Ultra mini meter (FOR ICD-9 250.00, 250.01). Dx: E11.9 1 each 0  . Calcium Carb-Cholecalciferol (SM CALCIUM/VITAMIN D) 600-800 MG-UNIT TABS Take 600-800 Units by mouth daily. 90 tablet 3  .  carvedilol (COREG) 6.25 MG tablet Take 1 tablet (6.25 mg total) by mouth 2 (two) times daily. 180 tablet 3  . glimepiride (AMARYL) 2 MG tablet TAKE 1 TABLET (2 MG TOTAL) BY MOUTH 2 (TWO) TIMES DAILY. 180 tablet 1  . glucose blood (ONE TOUCH ULTRA TEST) test strip USE TO TEST BLOOD SUGAR UP TO 4 TIMES DAILY 100 each 6  . hydrochlorothiazide (HYDRODIURIL) 25 MG tablet Take 1 tablet (25 mg total) by mouth daily. 90 tablet 3  . Insulin Pen Needle 32G X 6 MM MISC Use as directed with Lantus pen 100 each 3  . LANTUS SOLOSTAR 100 UNIT/ML Solostar Pen INJECT 20 UNITS INTO THE SKIN DAILY  AT 10 PM. 6 pen 0  . letrozole (FEMARA) 2.5 MG tablet TAKE 1 TABLET (2.5 MG TOTAL) BY MOUTH DAILY. 90 tablet 2  . losartan (COZAAR) 50 MG tablet Take 2 tablets (100 mg total) by mouth daily. 90 tablet 3  . pantoprazole (PROTONIX) 40 MG tablet Take 1 tablet (40 mg total) by mouth daily. 90 tablet 1  . saxagliptin HCl (ONGLYZA) 5 MG TABS tablet Take 0.5 tablets (2.5 mg total) by mouth daily. 30 tablet 11   No current facility-administered medications for this visit.     Review of Systems:  GENERAL:  Feels "alright".  Tired.  No fevers or sweats.  Weight down 1 pound. PERFORMANCE STATUS (ECOG):  1 HEENT:  No visual changes, runny nose, sore throat, mouth sores or tenderness. Lungs: No shortness of breath.  No cough.  No hemoptysis. Cardiac:  No chest pain, palpitations, or orthopnea. GI:  Chronic constipation. No nausea, vomiting, diarrhea, melena or hematochezia. GU:  No urgency, frequency, dysuria, or hematuria. Musculoskeletal:  Back pain with standing.  No joint pain.  No muscle tenderness. Extremities:  Calf "tightness" with movement/dancing (? claudication).  No pain or swelling. Skin:  No rashes or skin changes. Neuro:  Pins and needles sensation in fingers and toes.  No headache, numbness or weakness, balance or coordination issues. Endocrine:  No diabetes, thyroid issues, hot flashes or night sweats. Psych:  No mood changes, depression or anxiety. Pain:  No focal pain. Review of systems:  All other systems reviewed and found to be negative.  Physical Exam: Blood pressure (!) 153/75, pulse 87, temperature 98.1 F (36.7 C), temperature source Tympanic, resp. rate 18, weight 174 lb (78.9 kg). GENERAL:  Well developed, well nourished, woman sitting comfortably in the exam room in no acute distress. MENTAL STATUS:  Alert and oriented to person, place and time. HEAD:  Wearing a black cap.  Short brown hair with slight graying.  Normocephalic, atraumatic, face symmetric, no Cushingoid  features. EYES:  Blue eyes.  Pupils equal round and reactive to light and accomodation.  No conjunctivitis or scleral icterus. ENT:  Oropharynx clear without lesion.  Tongue normal. Mucous membranes moist.  RESPIRATORY:  Clear to auscultation without rales, wheezes or rhonchi. CARDIOVASCULAR:  Regular rate and rhythm without murmur, rub or gallop. ABDOMEN:  Soft, non-tender, with active bowel sounds, and no hepatosplenomegaly.  No masses. BREAST:  Right breast with upper outer semicircular incision.  No masses, skin changes or nipple discharge.  Left breast with medial longitudinal incision.  Fibrocystic changes inferiorly. No masses, skin changes or nipple discharge.  SKIN:  No rashes, ulcers or lesions. EXTREMITIES: No edema, no skin discoloration or tenderness.  No palpable cords. LYMPH NODES: Tender node to LEFT side of neck. No other palpable cervical, supraclavicular, axillary or inguinal adenopathy  NEUROLOGICAL: Unremarkable. PSYCH:  Appropriate.   Appointment on 03/14/2017  Component Date Value Ref Range Status  . WBC 03/14/2017 9.9  3.6 - 11.0 K/uL Final  . RBC 03/14/2017 4.37  3.80 - 5.20 MIL/uL Final  . Hemoglobin 03/14/2017 13.1  12.0 - 16.0 g/dL Final  . HCT 03/14/2017 37.4  35.0 - 47.0 % Final  . MCV 03/14/2017 85.7  80.0 - 100.0 fL Final  . MCH 03/14/2017 30.0  26.0 - 34.0 pg Final  . MCHC 03/14/2017 35.0  32.0 - 36.0 g/dL Final  . RDW 03/14/2017 13.4  11.5 - 14.5 % Final  . Platelets 03/14/2017 233  150 - 440 K/uL Final  . Neutrophils Relative % 03/14/2017 69  % Final  . Neutro Abs 03/14/2017 7.0* 1.4 - 6.5 K/uL Final  . Lymphocytes Relative 03/14/2017 14  % Final  . Lymphs Abs 03/14/2017 1.4  1.0 - 3.6 K/uL Final  . Monocytes Relative 03/14/2017 6  % Final  . Monocytes Absolute 03/14/2017 0.5  0.2 - 0.9 K/uL Final  . Eosinophils Relative 03/14/2017 10  % Final  . Eosinophils Absolute 03/14/2017 1.0* 0 - 0.7 K/uL Final  . Basophils Relative 03/14/2017 1  % Final  .  Basophils Absolute 03/14/2017 0.1  0 - 0.1 K/uL Final  . Sodium 03/14/2017 130* 135 - 145 mmol/L Final  . Potassium 03/14/2017 3.8  3.5 - 5.1 mmol/L Final  . Chloride 03/14/2017 92* 101 - 111 mmol/L Final  . CO2 03/14/2017 27  22 - 32 mmol/L Final  . Glucose, Bld 03/14/2017 342* 65 - 99 mg/dL Final  . BUN 03/14/2017 24* 6 - 20 mg/dL Final  . Creatinine, Ser 03/14/2017 1.43* 0.44 - 1.00 mg/dL Final  . Calcium 03/14/2017 9.4  8.9 - 10.3 mg/dL Final  . Total Protein 03/14/2017 6.8  6.5 - 8.1 g/dL Final  . Albumin 03/14/2017 4.4  3.5 - 5.0 g/dL Final  . AST 03/14/2017 19  15 - 41 U/L Final  . ALT 03/14/2017 21  14 - 54 U/L Final  . Alkaline Phosphatase 03/14/2017 111  38 - 126 U/L Final  . Total Bilirubin 03/14/2017 0.6  0.3 - 1.2 mg/dL Final  . GFR calc non Af Amer 03/14/2017 35* >60 mL/min Final  . GFR calc Af Amer 03/14/2017 40* >60 mL/min Final   Comment: (NOTE) The eGFR has been calculated using the CKD EPI equation. This calculation has not been validated in all clinical situations. eGFR's persistently <60 mL/min signify possible Chronic Kidney Disease.   . Anion gap 03/14/2017 11  5 - 15 Final    Assessment:  Kelsey Mason is a 76 y.o. female with bilateral breast cancer s/p lumpectomy and sentinel lymph node biopsy on 11/17/2012.  Right breast revealed a 5 mm grade I invasive carcinoma.  There was no DCIS.  Two sentinel lymph nodes were negative.  Pathologic stage was T1aN0M0.  Tumor was ER + (90%), PR + (20%), and Her2/neu -.   Left breast revealed a 1.8 cm grade III invasive ductal carcinoma.  There was lymphovascular invasion.  There was no DCIS.  One sentinel lymph node was negative.  Pathologic stage was T1cN0M0.  Tumor was ER + (>90%), PR + (1-5%), and Her2/neu -.  Oncotype DX testing on the left breast mass revealed a recurrence score of 28 which corresponded to a 10 year risk of distant recurrence of 19% (CI 15-23%) with tamoxifen alone.  She received  Adriamycin and Cytoxan (AC) x 4 cycles followed  by weekly Taxol x 10 (completed 06/05/2013).  She did not receive 2 cycles of Taxol secondary to progressive neuropathy.  She received bilateral breast radiation (07/2013 - 09/2013).  She began letrozole (Femara) in 09/2013.  Bilateral diagnostic mammogram on 03/22/2016 revealed  no evidence of recurrent disease.  CA27.29 has been followed:  24.4 on 09/10/2016 and 28.1 on 03/14/2017.  Bone density study on 03/21/2015 was normal with a T score of -0.5 in L1-L4 and -0.8 in the left femur.  She has a history of a borderline mild normocytic anemia.  Diet appears modest.  She denies any melena, hematochezia, hematuria or vaginal bleeding. Colonoscopy on 10/13/2015 was negative.    Symptomatically, she is feeling well except for sleep difficulties.  Exam reveals a palpable left neck node.  Hemoglobin is 13.1.  Creatinine is 1.43. Blood sugar elevated at 342.  Plan: 1.  Labs today: CBC with diff, CMP, CA27.29. 2.  Discuss testing for BCI testing after 4-1/2 years (03/2018) to assess benefit of extended adjuvant hormonal therapy. 3.  Continue Femara. 4.  Mammogram scheduled for 03/22/2017. 5.  Bone density scheduled for 03/22/2017. 6.  Soft tissue neck ultrasound to assess LEFT neck adenopathy. 7.  RTC in 1 week for MD assessment and to review ultrasound results.  6.  RTC in 6 months for MD assessment and labs (CBC with diff, CMP, CA27.29).   Honor Loh, NP  03/14/2017, 4:39 PM   I saw and evaluated the patient, participating in the key portions of the service and reviewing pertinent diagnostic studies and records.  I reviewed the nurse practitioner's note and agree with the findings and the plan.  The assessment and plan were discussed with the patient.   A few questions were asked by the patient and answered.   Lequita Asal, MD 03/14/2017

## 2017-03-15 ENCOUNTER — Telehealth: Payer: Self-pay | Admitting: Hematology and Oncology

## 2017-03-15 LAB — CANCER ANTIGEN 27.29: CA 27.29: 28.1 U/mL (ref 0.0–38.6)

## 2017-03-15 NOTE — Telephone Encounter (Signed)
Left VM on both numbers for [place] scheduled for tomorrow. Instructed to call back for additional appt information.

## 2017-03-16 ENCOUNTER — Ambulatory Visit
Admission: RE | Admit: 2017-03-16 | Discharge: 2017-03-16 | Disposition: A | Discharge status: Home / Self Care | Payer: Medicare Other | Source: Ambulatory Visit | Attending: Urgent Care | Admitting: Urgent Care

## 2017-03-16 DIAGNOSIS — R599 Enlarged lymph nodes, unspecified: Secondary | ICD-10-CM

## 2017-03-16 DIAGNOSIS — R591 Generalized enlarged lymph nodes: Secondary | ICD-10-CM | POA: Insufficient documentation

## 2017-03-22 ENCOUNTER — Inpatient Hospital Stay (HOSPITAL_BASED_OUTPATIENT_CLINIC_OR_DEPARTMENT_OTHER): Payer: Medicare Other | Admitting: Hematology and Oncology

## 2017-03-22 VITALS — BP 144/74 | HR 81 | Temp 99.0°F | Wt 174.4 lb

## 2017-03-22 DIAGNOSIS — H9190 Unspecified hearing loss, unspecified ear: Secondary | ICD-10-CM

## 2017-03-22 DIAGNOSIS — C50911 Malignant neoplasm of unspecified site of right female breast: Secondary | ICD-10-CM

## 2017-03-22 DIAGNOSIS — Z170 Estrogen receptor positive status [ER+]: Secondary | ICD-10-CM | POA: Diagnosis not present

## 2017-03-22 DIAGNOSIS — Z9221 Personal history of antineoplastic chemotherapy: Secondary | ICD-10-CM | POA: Diagnosis not present

## 2017-03-22 DIAGNOSIS — R5383 Other fatigue: Secondary | ICD-10-CM | POA: Diagnosis not present

## 2017-03-22 DIAGNOSIS — C50912 Malignant neoplasm of unspecified site of left female breast: Secondary | ICD-10-CM

## 2017-03-22 DIAGNOSIS — Z79811 Long term (current) use of aromatase inhibitors: Secondary | ICD-10-CM

## 2017-03-22 DIAGNOSIS — Z880 Allergy status to penicillin: Secondary | ICD-10-CM

## 2017-03-22 DIAGNOSIS — Z7982 Long term (current) use of aspirin: Secondary | ICD-10-CM

## 2017-03-22 DIAGNOSIS — Z923 Personal history of irradiation: Secondary | ICD-10-CM

## 2017-03-22 DIAGNOSIS — G4700 Insomnia, unspecified: Secondary | ICD-10-CM

## 2017-03-22 DIAGNOSIS — Z7984 Long term (current) use of oral hypoglycemic drugs: Secondary | ICD-10-CM

## 2017-03-22 DIAGNOSIS — R590 Localized enlarged lymph nodes: Secondary | ICD-10-CM | POA: Diagnosis not present

## 2017-03-22 DIAGNOSIS — C50419 Malignant neoplasm of upper-outer quadrant of unspecified female breast: Secondary | ICD-10-CM

## 2017-03-22 DIAGNOSIS — Z87891 Personal history of nicotine dependence: Secondary | ICD-10-CM

## 2017-03-22 DIAGNOSIS — K219 Gastro-esophageal reflux disease without esophagitis: Secondary | ICD-10-CM

## 2017-03-22 DIAGNOSIS — C50312 Malignant neoplasm of lower-inner quadrant of left female breast: Secondary | ICD-10-CM

## 2017-03-22 DIAGNOSIS — Z79899 Other long term (current) drug therapy: Secondary | ICD-10-CM

## 2017-03-22 DIAGNOSIS — Z886 Allergy status to analgesic agent status: Secondary | ICD-10-CM

## 2017-03-22 DIAGNOSIS — D649 Anemia, unspecified: Secondary | ICD-10-CM

## 2017-03-22 NOTE — Progress Notes (Signed)
[facility] day:  03/22/2017  Chief Complaint: Kelsey Mason is a 76 y.o. female with bilateral stage IA breast cancer who is seen for review of interval ultrasound.  HPI: The patient was last seen in the medical oncology clinic on 03/14/2017.  At that time, she was feeling well except for sleep difficulties.  She was tolerating Femara.  Exam revealed a palpable left neck node.  CBC, CMP and CA27.29 were normal except for a creatinine of 1.43 (baseline 0.97- 1.08).  Ultrasound on 03/16/2017 revealed no fluid collection or soft tissue lesion.  She is scheduled for mammogram and bone density study next week.  Symptomatically, she complains of decreased hearing. She used "ear wax removers" and since then, she has not been able to hear as well.  She is to follow up with her PCP on Thursday. Patient has no other complaints of today. She is feeling "pretty good".    Past Medical History:  Diagnosis Date  . Breast symptom 2014  . Cancer (HCC)    Bilateral Breast, chemo Dr. Jeb Levering  . Diabetes mellitus   . GERD (gastroesophageal reflux disease)    mild  . Hypertension     Past Surgical History:  Procedure Laterality Date  . ABDOMINAL HYSTERECTOMY  1976   for pelvic pain  . BREAST EXCISIONAL BIOPSY Bilateral 11/2012   bilat breast ca chemo and rad  . BREAST SURGERY Bilateral 2014   bilateral lumpectomy and SN biopsy  . CHOLECYSTECTOMY  2007  . COLONOSCOPY WITH PROPOFOL N/A 10/13/2015   Procedure: COLONOSCOPY WITH PROPOFOL;  Surgeon: Manya Silvas, MD;  Location: [facility] ENDOSCOPY;  Service: Endoscopy;  Laterality: N/A;  . OOPHORECTOMY Right   . PORTACATH PLACEMENT  2014  . TUMOR REMOVAL Right 1980   right foot    Family History  Problem Relation Age of Onset  . Alcohol abuse Mother   . Heart disease Mother   . Hypertension Mother   . Alcohol abuse Father   . Heart disease Father   . Hypertension Father   . Heart disease  Brother     Social History:  reports that she quit smoking about 55 years ago. Her smoking use included Cigarettes. She has a 0.25 pack-year smoking history. She has never used smokeless tobacco. She reports that she does not drink alcohol or use drugs.  She likes to do line dancing.  She lives in [place].  The patient is alone today.  Allergies:  Allergies  Allergen Reactions  . Advil [Ibuprofen] Other (See Comments)    Dizziness   . Aleve [Naproxen] Other (See Comments)    Dizziness  . Fluticasone-Salmeterol Other (See Comments)    dizziness  . Lipitor [Atorvastatin] Other (See Comments)    Leg pain, muscle ache  . Penicillins Hives    Current Medications: Current Outpatient Prescriptions  Medication Sig Dispense Refill  . amLODipine (NORVASC) 5 MG tablet Take 1 tablet (5 mg total) by mouth daily. 90 tablet 3  . aspirin 81 MG tablet Take 81 mg by mouth daily.    . blood glucose meter kit and supplies KIT Dispense based on patient and insurance preference. Use up to four times daily as directed. One Touch Ultra mini meter (FOR ICD-9 250.00, 250.01). Dx: E11.9 1 each 0  . Calcium Carb-Cholecalciferol (SM CALCIUM/VITAMIN D) 600-800 MG-UNIT TABS Take 600-800 Units by mouth daily. 90 tablet 3  . carvedilol (COREG) 6.25 MG tablet Take 1 tablet (6.25 mg total)  by mouth 2 (two) times daily. 180 tablet 3  . glimepiride (AMARYL) 2 MG tablet TAKE 1 TABLET (2 MG TOTAL) BY MOUTH 2 (TWO) TIMES DAILY. 180 tablet 1  . glucose blood (ONE TOUCH ULTRA TEST) test strip USE TO TEST BLOOD SUGAR UP TO 4 TIMES DAILY 100 each 6  . hydrochlorothiazide (HYDRODIURIL) 25 MG tablet Take 1 tablet (25 mg total) by mouth daily. 90 tablet 3  . Insulin Pen Needle 32G X 6 MM MISC Use as directed with Lantus pen 100 each 3  . LANTUS SOLOSTAR 100 UNIT/ML Solostar Pen INJECT 20 UNITS INTO THE SKIN DAILY AT 10 PM. 6 pen 0  . letrozole (FEMARA) 2.5 MG tablet TAKE 1 TABLET (2.5 MG TOTAL) BY MOUTH DAILY. 90 tablet 2  .  losartan (COZAAR) 50 MG tablet Take 2 tablets (100 mg total) by mouth daily. 90 tablet 3  . pantoprazole (PROTONIX) 40 MG tablet Take 1 tablet (40 mg total) by mouth daily. 90 tablet 1  . saxagliptin HCl (ONGLYZA) 5 MG TABS tablet Take 0.5 tablets (2.5 mg total) by mouth daily. 30 tablet 11   No current facility-administered medications for this visit.     Review of Systems:  GENERAL:  Feels "pretty good".  No fevers or sweats.  Weight stable. PERFORMANCE STATUS (ECOG):  1 HEENT:  Cerumen impaction. No visual changes, runny nose, sore throat, mouth sores or tenderness. Lungs: No shortness of breath.  No cough.  No hemoptysis. Cardiac:  No chest pain, palpitations, or orthopnea. GI:  Chronic constipation. No nausea, vomiting, diarrhea, melena or hematochezia. GU:  No urgency, frequency, dysuria, or hematuria. Musculoskeletal:  Back pain with standing.  No joint pain.  No muscle tenderness. Extremities:  No pain or swelling. Skin:  No rashes or skin changes. Neuro:  Pins and needles sensation in fingers and toes.  No headache, numbness or weakness, balance or coordination issues. Endocrine:  No diabetes, thyroid issues, hot flashes or night sweats. Psych:  No mood changes, depression or anxiety. Pain:  No focal pain. Review of systems:  All other systems reviewed and found to be negative.  Physical Exam: Blood pressure (!) 144/74, pulse 81, temperature 99 F (37.2 C), temperature source Tympanic, weight 174 lb 7 oz (79.1 kg). GENERAL:  Well developed, well nourished, woman sitting comfortably in the exam room in no acute distress. MENTAL STATUS:  Alert and oriented to person, place and time. HEAD:  Short brown hair with slight graying.  Normocephalic, atraumatic, face symmetric, no Cushingoid features. EYES:  Blue eyes.  No conjunctivitis or scleral icterus. NEUROLOGICAL: Unremarkable. PSYCH:  Appropriate.   No visits with results within 3 Day(s) from this visit.  Latest known visit  with results is:  Appointment on 03/14/2017  Component Date Value Ref Range Status  . WBC 03/14/2017 9.9  3.6 - 11.0 K/uL Final  . RBC 03/14/2017 4.37  3.80 - 5.20 MIL/uL Final  . Hemoglobin 03/14/2017 13.1  12.0 - 16.0 g/dL Final  . HCT 03/14/2017 37.4  35.0 - 47.0 % Final  . MCV 03/14/2017 85.7  80.0 - 100.0 fL Final  . MCH 03/14/2017 30.0  26.0 - 34.0 pg Final  . MCHC 03/14/2017 35.0  32.0 - 36.0 g/dL Final  . RDW 03/14/2017 13.4  11.5 - 14.5 % Final  . Platelets 03/14/2017 233  150 - 440 K/uL Final  . Neutrophils Relative % 03/14/2017 69  % Final  . Neutro Abs 03/14/2017 7.0* 1.4 - 6.5 K/uL Final  .  Lymphocytes Relative 03/14/2017 14  % Final  . Lymphs Abs 03/14/2017 1.4  1.0 - 3.6 K/uL Final  . Monocytes Relative 03/14/2017 6  % Final  . Monocytes Absolute 03/14/2017 0.5  0.2 - 0.9 K/uL Final  . Eosinophils Relative 03/14/2017 10  % Final  . Eosinophils Absolute 03/14/2017 1.0* 0 - 0.7 K/uL Final  . Basophils Relative 03/14/2017 1  % Final  . Basophils Absolute 03/14/2017 0.1  0 - 0.1 K/uL Final  . Sodium 03/14/2017 130* 135 - 145 mmol/L Final  . Potassium 03/14/2017 3.8  3.5 - 5.1 mmol/L Final  . Chloride 03/14/2017 92* 101 - 111 mmol/L Final  . CO2 03/14/2017 27  22 - 32 mmol/L Final  . Glucose, Bld 03/14/2017 342* 65 - 99 mg/dL Final  . BUN 03/14/2017 24* 6 - 20 mg/dL Final  . Creatinine, Ser 03/14/2017 1.43* 0.44 - 1.00 mg/dL Final  . Calcium 03/14/2017 9.4  8.9 - 10.3 mg/dL Final  . Total Protein 03/14/2017 6.8  6.5 - 8.1 g/dL Final  . Albumin 03/14/2017 4.4  3.5 - 5.0 g/dL Final  . AST 03/14/2017 19  15 - 41 U/L Final  . ALT 03/14/2017 21  14 - 54 U/L Final  . Alkaline Phosphatase 03/14/2017 111  38 - 126 U/L Final  . Total Bilirubin 03/14/2017 0.6  0.3 - 1.2 mg/dL Final  . GFR calc non Af Amer 03/14/2017 35* >60 mL/min Final  . GFR calc Af Amer 03/14/2017 40* >60 mL/min Final   Comment: (NOTE) The eGFR has been calculated using the CKD EPI equation. This  calculation has not been validated in all clinical situations. eGFR's persistently <60 mL/min signify possible Chronic Kidney Disease.   . Anion gap 03/14/2017 11  5 - 15 Final  . CA 27.29 03/14/2017 28.1  0.0 - 38.6 U/mL Final   Comment: (NOTE) [employer]/ACS methodology Performed At: [facility] [address] [place], [place] [id number] Lindon Romp MD YS:[id number]     Assessment:  Kelsey Mason is a 76 y.o. female with bilateral breast cancer s/p lumpectomy and sentinel lymph node biopsy on 11/17/2012.  Right breast revealed a 5 mm grade I invasive carcinoma.  There was no DCIS.  Two sentinel lymph nodes were negative.  Pathologic stage was T1aN0M0.  Tumor was ER + (90%), PR + (20%), and Her2/neu -.   Left breast revealed a 1.8 cm grade III invasive ductal carcinoma.  There was lymphovascular invasion.  There was no DCIS.  One sentinel lymph node was negative.  Pathologic stage was T1cN0M0.  Tumor was ER + (>90%), PR + (1-5%), and Her2/neu -.  Oncotype DX testing on the left breast mass revealed a recurrence score of 28 which corresponded to a 10 year risk of distant recurrence of 19% (CI 15-23%) with tamoxifen alone.  She received Adriamycin and Cytoxan (AC) x 4 cycles followed by weekly Taxol x 10 (completed 06/05/2013).  She did not receive 2 cycles of Taxol secondary to progressive neuropathy.  She received bilateral breast radiation (07/2013 - 09/2013).  She began letrozole (Femara) in 09/2013.  Bilateral diagnostic mammogram on 03/22/2016 revealed  no evidence of recurrent disease.  CA27.29 has been followed:  24.4 on 09/10/2016 and 28.1 on 03/14/2017.  Bone density study on 03/21/2015 was normal with a T score of -0.5 in L1-L4 and -0.8 in the left femur.  She has a history of a borderline mild normocytic anemia.  Diet appears modest.  She denies any melena,  hematochezia, hematuria or vaginal bleeding. Colonoscopy on 10/13/2015 was negative.    Soft  tissue neck ultrasound on 03/16/2017 revealed no fluid collection or soft tissue lesion.  Symptomatically, she is feeling pretty good.  Exam stable.   Plan: 1.  Review ultrasound results- adenopathy to LEFT neck felt to be reactive. No treatment required. 2.  Follow-up bone density and mammogram on 03/31/2017. 3.  Continue Femara. 4.  RTC on 09/12/2017 for MD assessment and labs (CBC with diff, CMP, CA27.29).   Honor Loh, NP  03/22/2017, 12:16 PM   I saw and evaluated the patient, participating in the key portions of the service and reviewing pertinent diagnostic studies and records.  I reviewed the nurse practitioner's note and agree with the findings and the plan.  The assessment and plan were discussed with the patient.  A few questions were asked by the patient and answered.   Lequita Asal, MD 03/22/2017, 12:16 PM

## 2017-03-22 NOTE — Progress Notes (Signed)
Patient states she used an ear wax kit over the weekend to clean her ears.  She was unable to get the water out and states she is having difficulty hearing.  She is going to schedule an ENT visit.  Otherwise, no complaints.

## 2017-03-24 ENCOUNTER — Ambulatory Visit (INDEPENDENT_AMBULATORY_CARE_PROVIDER_SITE_OTHER): Payer: Medicare Other | Admitting: Family Medicine

## 2017-03-24 ENCOUNTER — Encounter: Payer: Self-pay | Admitting: Family Medicine

## 2017-03-24 DIAGNOSIS — Z23 Encounter for immunization: Secondary | ICD-10-CM

## 2017-03-24 DIAGNOSIS — H6120 Impacted cerumen, unspecified ear: Secondary | ICD-10-CM | POA: Insufficient documentation

## 2017-03-24 DIAGNOSIS — I10 Essential (primary) hypertension: Secondary | ICD-10-CM | POA: Diagnosis not present

## 2017-03-24 DIAGNOSIS — H6123 Impacted cerumen, bilateral: Secondary | ICD-10-CM | POA: Diagnosis not present

## 2017-03-24 NOTE — Assessment & Plan Note (Signed)
New problem. Successful irrigation today.

## 2017-03-24 NOTE — Assessment & Plan Note (Signed)
At goal per home readings. Continue Norvasc, carvedilol, HCTZ, losartan.

## 2017-03-24 NOTE — Patient Instructions (Signed)
Continue your medications.  Be sure to see Endo.  Take care  Follow up in 3-6 months.  Take care  Dr. Lacinda Axon

## 2017-03-24 NOTE — Progress Notes (Signed)
Patient ID: Kelsey Mason, female   DOB: [birth year], 76 y.o.   MRN: [id number]  Bilateral irrigation performed.  Left ear irrigation done without dfficulty .  Patient tolerated procedure well wax came out. Right ear irrigated and wax flushed out, however patient became dizzy.  Patient remained in the room for a few minutes and she felt fine.

## 2017-03-24 NOTE — Progress Notes (Addendum)
Subjective:  Patient ID: Kelsey Mason, female    DOB: 09-Feb-1941  Age: 76 y.o. MRN: [id number]  CC: Hearing loss  HPI:  76 year old female presents with the above complaint.  This visit was scheduled as a follow-up visit. This is not correct. She presents with an acute issue.  Patient reports since Saturday she has had hearing loss in both ears. Moderate in severity. She states that she has been told that she has a lot of wax in the past. She used an over-the-counter earwax kit without resolution. No drainage from the ear. No fevers or chills. No known exacerbating factors. No other associated symptoms. No other complaints or concerns at this time.  Additionally, patient's blood pressure is elevated today. She endorses compliance with her home blood pressure medications. She states that her blood pressure is at goal at home. Typically in the 130s/80's.  Social Hx   Social History   Social History  . Marital status: Widowed    Spouse name: N/A  . Number of children: N/A  . Years of education: N/A   Social History Main Topics  . Smoking status: Former Smoker    Packs/day: 0.25    Years: 1.00    Types: Cigarettes    Quit date: 1963  . Smokeless tobacco: Never Used  . Alcohol use No  . Drug use: No  . Sexual activity: No   Other Topics Concern  . None   Social History Narrative   Lives in [place].       Works - [employer]   Diet - regular   Exercise - dance 3x per week    Review of Systems  Constitutional: Negative.   HENT: Positive for hearing loss. Negative for ear discharge and ear pain.    Objective:  BP (!) 160/86 (BP Location: Left Arm, Patient Position: Sitting, Cuff Size: Normal)   Pulse 83   Temp 98.5 F (36.9 C) (Oral)   Wt 175 lb (79.4 kg)   SpO2 98%   BMI 32.01 kg/m   BP/Weight 03/24/2017 03/22/2017 2/91/9166  Systolic BP 060 045 997  Diastolic BP 86 74 75  Wt. (Lbs) 175 174.44 174  BMI 32.01 31.91 31.83   Physical Exam    Constitutional: She is oriented to person, place, and time. She appears well-developed. No distress.  HENT:  Head: Normocephalic and atraumatic.  TM's obscured by cerumen bilaterally.  Cardiovascular: Normal rate and regular rhythm.   Murmur heard. Pulmonary/Chest: Effort normal. She has no wheezes. She has no rales.  Neurological: She is alert and oriented to person, place, and time.  Psychiatric: She has a normal mood and affect.  Vitals reviewed.  Lab Results  Component Value Date   WBC 9.9 03/14/2017   HGB 13.1 03/14/2017   HCT 37.4 03/14/2017   PLT 233 03/14/2017   GLUCOSE 342 (H) 03/14/2017   CHOL 149 12/20/2016   TRIG 105.0 12/20/2016   HDL 44.80 12/20/2016   LDLDIRECT 178.7 10/02/2012   LDLCALC 83 12/20/2016   ALT 21 03/14/2017   AST 19 03/14/2017   NA 130 (L) 03/14/2017   K 3.8 03/14/2017   CL 92 (L) 03/14/2017   CREATININE 1.43 (H) 03/14/2017   BUN 24 (H) 03/14/2017   CO2 27 03/14/2017   TSH 1.71 02/14/2015   HGBA1C 9.3 (H) 12/20/2016   MICROALBUR 14.8 (H) 02/14/2015    Assessment & Plan:   Problem List Items Addressed This Visit    Cerumen impaction  New problem. Successful irrigation today.      Hypertension (Chronic)    At goal per home readings. Continue Norvasc, carvedilol, HCTZ, losartan.       Other Visit Diagnoses    Encounter for immunization       Relevant Orders   Flu vaccine HIGH DOSE PF (Completed)     Follow-up: Follow up with new PCP in 3-6 months  [place] DO [facility]

## 2017-03-27 ENCOUNTER — Encounter: Payer: Self-pay | Admitting: Hematology and Oncology

## 2017-03-31 ENCOUNTER — Ambulatory Visit
Admission: RE | Admit: 2017-03-31 | Discharge: 2017-03-31 | Disposition: A | Discharge status: Home / Self Care | Payer: Medicare Other | Source: Ambulatory Visit | Attending: General Surgery | Admitting: General Surgery

## 2017-03-31 DIAGNOSIS — Z853 Personal history of malignant neoplasm of breast: Secondary | ICD-10-CM | POA: Diagnosis not present

## 2017-03-31 DIAGNOSIS — C50411 Malignant neoplasm of upper-outer quadrant of right female breast: Secondary | ICD-10-CM | POA: Diagnosis present

## 2017-03-31 DIAGNOSIS — C50312 Malignant neoplasm of lower-inner quadrant of left female breast: Secondary | ICD-10-CM

## 2017-03-31 DIAGNOSIS — Z170 Estrogen receptor positive status [ER+]: Secondary | ICD-10-CM | POA: Diagnosis present

## 2017-03-31 HISTORY — DX: Personal history of irradiation: Z92.3

## 2017-03-31 HISTORY — DX: Personal history of antineoplastic chemotherapy: Z92.21

## 2017-04-01 ENCOUNTER — Ambulatory Visit (INDEPENDENT_AMBULATORY_CARE_PROVIDER_SITE_OTHER): Payer: Medicare Other | Admitting: *Deleted

## 2017-04-01 ENCOUNTER — Other Ambulatory Visit: Payer: Medicare Other

## 2017-04-01 VITALS — BP 141/65 | HR 74 | Ht 62.5 in | Wt 172.0 lb

## 2017-04-01 DIAGNOSIS — I10 Essential (primary) hypertension: Secondary | ICD-10-CM | POA: Diagnosis not present

## 2017-04-01 NOTE — Progress Notes (Signed)
1.) Reason for visit: BP Check, repeat BMP  2.) Name of MD requesting visit: Dr. Saunders Revel  3.) H&P: hypertension  4.) ROS related to problem: Patient here for BP check and lab work. Patient recently had lab work on 03/28/17 at [facility] (in [place]). Lab work was not done today. Patient's BP today 141/65, HR 72. No complaint of chest pain, shortness of breath, dizziness or swelling. Patient is taking BP medications as written.   5.) Assessment and plan per MD: Patient advised to continue current medications at this time and will await for further advice

## 2017-04-05 ENCOUNTER — Other Ambulatory Visit: Payer: Self-pay | Admitting: Family Medicine

## 2017-04-05 NOTE — Progress Notes (Signed)
It looks like Ms. Simenson's creatinine has increased slightly again (1.6 on labs at [facility] last week). Please have her increase her fluid intake. I also suggest that she have a renal artery Doppler to exclude renal artery stenosis, given refractory hypertension and renal insufficiency. Thanks.

## 2017-04-06 ENCOUNTER — Telehealth: Payer: Self-pay | Admitting: *Deleted

## 2017-04-06 DIAGNOSIS — N289 Disorder of kidney and ureter, unspecified: Secondary | ICD-10-CM

## 2017-04-06 DIAGNOSIS — I10 Essential (primary) hypertension: Secondary | ICD-10-CM

## 2017-04-06 NOTE — Telephone Encounter (Signed)
Results called to pt. Pt verbalized understanding of results and plan of care. Renal doppler ordered entered and patient transferred to scheduler.

## 2017-04-06 NOTE — Telephone Encounter (Signed)
Nelva Bush, MD at 04/01/2017 8:10 AM   Status: Signed    It looks like Ms. Rape's creatinine has increased slightly again (1.6 on labs at [facility] last week). Please have her increase her fluid intake. I also suggest that she have a renal artery Doppler to exclude renal artery stenosis, given refractory hypertension and renal insufficiency. Thanks.

## 2017-04-06 NOTE — Telephone Encounter (Signed)
-----   Message from Nelva Bush, MD sent at 04/05/2017  8:14 PM EDT -----   ----- Message ----- From: Vanessa Ralphs, RN Sent: 04/01/2017   8:27 AM To: Nelva Bush, MD  Patient had BMP at [facility] on 03/28/17. It is in [place]. I hope it is ok I did not have them draw lab work today since she just had it.  Thanks, [employer]

## 2017-04-07 ENCOUNTER — Ambulatory Visit (INDEPENDENT_AMBULATORY_CARE_PROVIDER_SITE_OTHER): Payer: Medicare Other | Admitting: General Surgery

## 2017-04-07 ENCOUNTER — Encounter: Payer: Self-pay | Admitting: General Surgery

## 2017-04-07 VITALS — BP 142/62 | HR 70 | Resp 12 | Ht 62.5 in | Wt 172.0 lb

## 2017-04-07 DIAGNOSIS — Z170 Estrogen receptor positive status [ER+]: Secondary | ICD-10-CM

## 2017-04-07 DIAGNOSIS — C50411 Malignant neoplasm of upper-outer quadrant of right female breast: Secondary | ICD-10-CM

## 2017-04-07 DIAGNOSIS — C50212 Malignant neoplasm of upper-inner quadrant of left female breast: Secondary | ICD-10-CM

## 2017-04-07 NOTE — Progress Notes (Signed)
Patient ID: Kelsey Mason, female   DOB: 1940-11-02, 76 y.o.   MRN: [id number]  Chief Complaint  Patient presents with  . Follow-up     Kelsey Mason is a 76 y.o. female.  who presents for her follow up breast cancer and a breast evaluation. The most recent mammogram was done on 03-31-17.  Patient does perform regular self breast checks and gets regular mammograms done.   No new breast issues. Pt is being followed by oncology and is scheduled for a bone density study.  History of bilateral breast cancer on Letrozole.   HPI  Past Medical History:  Diagnosis Date  . Breast symptom 2014  . Cancer [facility]) 2014   Bilateral Breast, chemo Dr. Jeb Levering  . Diabetes mellitus   . GERD (gastroesophageal reflux disease)    mild  . Hypertension   . Personal history of chemotherapy   . Personal history of radiation therapy     Past Surgical History:  Procedure Laterality Date  . ABDOMINAL HYSTERECTOMY  1976   for pelvic pain  . BREAST EXCISIONAL BIOPSY Bilateral 11/2012   bilat breast ca chemo and rad  . BREAST SURGERY Bilateral 2014   bilateral lumpectomy and SN biopsy  . CHOLECYSTECTOMY  2007  . COLONOSCOPY WITH PROPOFOL N/A 10/13/2015   Procedure: COLONOSCOPY WITH PROPOFOL;  Surgeon: Manya Silvas, MD;  Location: [facility] ENDOSCOPY;  Service: Endoscopy;  Laterality: N/A;  . OOPHORECTOMY Right   . PORTACATH PLACEMENT  2014  . TUMOR REMOVAL Right 1980   right foot    Family History  Problem Relation Age of Onset  . Alcohol abuse Mother   . Heart disease Mother   . Hypertension Mother   . Breast cancer Mother 59  . Alcohol abuse Father   . Heart disease Father   . Hypertension Father   . Heart disease Brother     Social History Social History  Substance Use Topics  . Smoking status: Former Smoker    Packs/day: 0.25    Years: 1.00    Types: Cigarettes    Quit date: 1963  . Smokeless tobacco: Never Used  . Alcohol use No    Allergies  Allergen Reactions   . Advil [Ibuprofen] Other (See Comments)    Dizziness   . Aleve [Naproxen] Other (See Comments)    Dizziness  . Fluticasone-Salmeterol Other (See Comments)    dizziness  . Lipitor [Atorvastatin] Other (See Comments)    Leg pain, muscle ache  . Penicillins Hives    Current Outpatient Prescriptions  Medication Sig Dispense Refill  . amLODipine (NORVASC) 5 MG tablet Take 1 tablet (5 mg total) by mouth daily. 90 tablet 3  . aspirin 81 MG tablet Take 81 mg by mouth daily.    . blood glucose meter kit and supplies KIT Dispense based on patient and insurance preference. Use up to four times daily as directed. One Touch Ultra mini meter (FOR ICD-9 250.00, 250.01). Dx: E11.9 1 each 0  . Calcium Carb-Cholecalciferol (SM CALCIUM/VITAMIN D) 600-800 MG-UNIT TABS Take 600-800 Units by mouth daily. 90 tablet 3  . carvedilol (COREG) 6.25 MG tablet Take 1 tablet (6.25 mg total) by mouth 2 (two) times daily. 180 tablet 3  . glimepiride (AMARYL) 2 MG tablet TAKE 1 TABLET (2 MG TOTAL) BY MOUTH 2 (TWO) TIMES DAILY. 180 tablet 1  . glucose blood (ONE TOUCH ULTRA TEST) test strip USE TO TEST BLOOD SUGAR UP TO 4 TIMES DAILY 100 each 6  .  hydrochlorothiazide (HYDRODIURIL) 25 MG tablet Take 1 tablet (25 mg total) by mouth daily. 90 tablet 3  . Insulin Pen Needle 32G X 6 MM MISC Use as directed with Lantus pen 100 each 3  . LANTUS SOLOSTAR 100 UNIT/ML Solostar Pen INJECT 20 UNITS INTO THE SKIN DAILY AT 10 PM. 18 pen 0  . letrozole (FEMARA) 2.5 MG tablet TAKE 1 TABLET (2.5 MG TOTAL) BY MOUTH DAILY. 90 tablet 2  . losartan (COZAAR) 50 MG tablet Take 2 tablets (100 mg total) by mouth daily. 90 tablet 3  . pantoprazole (PROTONIX) 40 MG tablet Take 1 tablet (40 mg total) by mouth daily. 90 tablet 1   No current facility-administered medications for this visit.     Review of Systems Review of Systems  Constitutional: Negative.   Respiratory: Negative.   Cardiovascular: Negative.     Blood pressure (!) 142/62,  pulse 70, resp. rate 12, height 5' 2.5" (1.588 m), weight 172 lb (78 kg).  Physical Exam Physical Exam  Constitutional: She is oriented to person, place, and time. She appears well-developed and well-nourished.  HENT:  Mouth/Throat: Oropharynx is clear and moist.  Eyes: Conjunctivae are normal. No scleral icterus.  Neck: Neck supple.  Cardiovascular: Normal rate, regular rhythm and normal heart sounds.   Pulmonary/Chest: Effort normal and breath sounds normal. No respiratory distress. Right breast exhibits no inverted nipple, no mass, no nipple discharge, no skin change and no tenderness. Left breast exhibits no inverted nipple, no mass, no nipple discharge, no skin change and no tenderness.  Bilateral nipples are slightly retracted - chronic.   Abdominal: Soft. Bowel sounds are normal. There is no tenderness.  Lymphadenopathy:    She has no cervical adenopathy.    She has no axillary adenopathy.  Neurological: She is alert and oriented to person, place, and time.  Skin: Skin is warm and dry.  Psychiatric: Her behavior is normal.    Data Reviewed Prior notes and recent mammogram reviewed  Assessment    Bilateral breast cancer s/p lumpectomy and chemo/radiation (medial inner quadrant left breast, upper outer quadrant right breast) - mammogram on 03/31/17 is stable with no signs of malignancy. Tolerating Letrozole well.     Plan    Continue to follow up with Dr. Mike Gip for bone density exam, annual breast exams, and bilateral diagnostic mammogram. Continue Letrozole with plans to order a breast cancer index next year.  Return to clinic if needed.     HPI, Physical Exam, Assessment and Plan have been scribed under the direction and in the presence of Mckinley Jewel, MD Karie Fetch, RN   I have completed the exam and reviewed the above documentation for accuracy and completeness.  I agree with the above.  Haematologist has been used and any errors in dictation or transcription  are unintentional.  Cynthia Cogle G. Jamal Collin, M.D., F.A.C.S.   Junie Panning G 04/07/2017, 10:24 AM

## 2017-04-07 NOTE — Patient Instructions (Signed)
Continue to follow up with Dr. Mike Gip for annual breast exams and bilateral diagnostic mammogram. Continue Letrozole. Return to clinic if needed.

## 2017-04-08 ENCOUNTER — Ambulatory Visit (INDEPENDENT_AMBULATORY_CARE_PROVIDER_SITE_OTHER): Payer: Medicare Other

## 2017-04-08 VITALS — BP 122/62 | HR 65 | Temp 98.3°F | Resp 14 | Ht 62.5 in | Wt 172.0 lb

## 2017-04-08 DIAGNOSIS — Z0000 Encounter for general adult medical examination without abnormal findings: Secondary | ICD-10-CM | POA: Diagnosis not present

## 2017-04-08 DIAGNOSIS — Z1331 Encounter for screening for depression: Secondary | ICD-10-CM

## 2017-04-08 NOTE — Progress Notes (Signed)
Subjective:   Kelsey Mason is a 76 y.o. female who presents for Medicare Annual (Subsequent) preventive examination.  Review of Systems:  No ROS.  Medicare Wellness Visit. Additional risk factors are reflected in the social history.  Cardiac Risk Factors include: advanced age (>34mn, >>10women);hypertension;diabetes mellitus     Objective:     Vitals: BP 122/62 (BP Location: Left Arm, Patient Position: Sitting, Cuff Size: Normal)   Pulse 65   Temp 98.3 F (36.8 C) (Oral)   Resp 14   Ht 5' 2.5" (1.588 m)   Wt 172 lb (78 kg)   SpO2 95%   BMI 30.96 kg/m   Body mass index is 30.96 kg/m.   Tobacco History  Smoking Status  . Former Smoker  . Packs/day: 0.25  . Years: 1.00  . Types: Cigarettes  . Quit date: 1963  Smokeless Tobacco  . Never Used     Counseling given: Not Answered   Past Medical History:  Diagnosis Date  . Breast symptom 2014  . Cancer ([facility] 2014   Bilateral Breast, chemo Dr. CJeb Levering . Diabetes mellitus   . GERD (gastroesophageal reflux disease)    mild  . Hypertension   . Personal history of chemotherapy   . Personal history of radiation therapy    Past Surgical History:  Procedure Laterality Date  . ABDOMINAL HYSTERECTOMY  1976   for pelvic pain  . BREAST EXCISIONAL BIOPSY Bilateral 11/2012   bilat breast ca chemo and rad  . BREAST SURGERY Bilateral 2014   bilateral lumpectomy and SN biopsy  . CHOLECYSTECTOMY  2007  . COLONOSCOPY WITH PROPOFOL N/A 10/13/2015   Procedure: COLONOSCOPY WITH PROPOFOL;  Surgeon: RManya Silvas MD;  [facility];  Service: Endoscopy;  Laterality: N/A;  . OOPHORECTOMY Right   . PORTACATH PLACEMENT  2014  . TUMOR REMOVAL Right 1980   right foot   Family History  Problem Relation Age of Onset  . Alcohol abuse Mother   . Heart disease Mother   . Hypertension Mother   . Breast cancer Mother 626 . Alcohol abuse Father   . Heart disease Father   . Hypertension Father   . Heart  disease Brother    History  Sexual Activity  . Sexual activity: No    Outpatient Encounter Prescriptions as of 04/08/2017  Medication Sig  . amLODipine (NORVASC) 5 MG tablet Take 1 tablet (5 mg total) by mouth daily.  .Marland Kitchenaspirin 81 MG tablet Take 81 mg by mouth daily.  . blood glucose meter kit and supplies KIT Dispense based on patient and insurance preference. Use up to four times daily as directed. One Touch Ultra mini meter (FOR ICD-9 250.00, 250.01). Dx: E11.9  . Calcium Carb-Cholecalciferol (SM CALCIUM/VITAMIN D) 600-800 MG-UNIT TABS Take 600-800 Units by mouth daily.  . carvedilol (COREG) 6.25 MG tablet Take 1 tablet (6.25 mg total) by mouth 2 (two) times daily.  .Marland Kitchenglimepiride (AMARYL) 2 MG tablet TAKE 1 TABLET (2 MG TOTAL) BY MOUTH 2 (TWO) TIMES DAILY.  .Marland Kitchenglucose blood (ONE TOUCH ULTRA TEST) test strip USE TO TEST BLOOD SUGAR UP TO 4 TIMES DAILY  . hydrochlorothiazide (HYDRODIURIL) 25 MG tablet Take 1 tablet (25 mg total) by mouth daily.  . Insulin Pen Needle 32G X 6 MM MISC Use as directed with Lantus pen  . LANTUS SOLOSTAR 100 UNIT/ML Solostar Pen INJECT 20 UNITS INTO THE SKIN DAILY AT 10 PM.  . letrozole (FEMARA) 2.5 MG tablet TAKE  1 TABLET (2.5 MG TOTAL) BY MOUTH DAILY.  [name] losartan (COZAAR) 50 MG tablet Take 2 tablets (100 mg total) by mouth daily.  . pantoprazole (PROTONIX) 40 MG tablet Take 1 tablet (40 mg total) by mouth daily.   No facility-administered encounter medications on file as of 04/08/2017.     Activities of Daily Living In your present state of health, do you have any difficulty performing the following activities: 04/08/2017  Hearing? N  Vision? N  Difficulty concentrating or making decisions? Y  Comment Difficulty focusing at times since chemo therapy and radiation  Walking or climbing stairs? N  Dressing or bathing? N  Doing errands, shopping? N  Preparing Food and eating ? N  Using the Toilet? N  In the past six months, have you accidently leaked urine?  N  Do you have problems with loss of bowel control? N  Managing your Medications? N  Managing your Finances? N  Housekeeping or managing your Housekeeping? N  Some recent data might be hidden    Patient Care Team: Leone Haven, MD as PCP - General (Family Medicine) Christene Lye, MD as Consulting Physician (General Surgery)    Assessment:    This is a routine wellness examination for [place]. The goal of the wellness visit is to assist the patient how to close the gaps in care and create a preventative care plan for the patient.   The roster of all physicians providing medical care to patient is listed in the Snapshot section of the chart.  Taking calcium VIT D as appropriate/Osteoporosis risk reviewed.    Safety issues reviewed; Smoke and carbon monoxide detectors in the home. No firearms in the home.  Wears seatbelts when driving or riding with others. Patient does wear sunscreen or protective clothing when in direct sunlight. No violence in the home.  Depression- PHQ 2 &9 complete.  No signs/symptoms or verbal communication regarding little pleasure in doing things, feeling down, depressed or hopeless. No changes in sleeping, energy, eating, concentrating.  No thoughts of self harm or harm towards others.  Time spent on this topic is 8 minutes.   No failures at ADL's or IADL's.    BMI- discussed the importance of a healthy diet, water intake and the benefits of aerobic exercise. Educational material provided.   24 hour diet recall: Breakfast: 2 bacon slices, 2 scrambled eggs Lunch: jello Dinner: hotdog, onion rings Snack: jello Daily fluid intake: 1 cups of caffeine, 4 cups of water  Dental- dental works  Eye- Visual acuity not assessed per patient preference since they have regular follow up with the ophthalmologist.    Sleep patterns- Sleeps 5-6 hours at night.   TDAP vaccine deferred per patient preference.  Follow up with insurance.  Educational  material provided.  Patient Concerns: None at this time. Follow up with PCP as needed.  Exercise Activities and Dietary recommendations Current Exercise Habits: Home exercise routine, Time (Minutes): 45, Frequency (Times/Week): 2, Weekly Exercise (Minutes/Week): 90, Intensity: Intense  Goals    . Maintain Healthy Lifestyle          Maintain exercise regimen of dancing 2 days, increase time as tolerated. Stay hydrated and drink plenty of water. Low carb foods. Educational material provided.      Fall Risk Fall Risk  04/08/2017 09/08/2016 01/30/2016 11/17/2015 09/10/2015  Falls in the past year? Yes No No No No  Number falls in past yr: 1 - - - -  Injury with Fall? No - - - -  Depression Screen PHQ 2/9 Scores 04/08/2017 09/08/2016 01/30/2016 11/17/2015  PHQ - 2 Score 0 0 0 0  PHQ- 9 Score 0 - - -     Cognitive Function MMSE - Mini Mental State Exam 04/08/2017 01/30/2016  Orientation to time 3 5  Orientation to time comments difficulty reading the face of a clock correctly -  Orientation to Place 5 5  Registration 3 3  Attention/ Calculation 3 5  Attention/Calculation-comments Difficulty performing simple calculations -  Recall 3 3  Language- name 2 objects 2 2  Language- repeat 1 1  Language- follow 3 step command 3 3  Language- read & follow direction 1 1  Write a sentence 1 1  Copy design 1 1  Total score 26 30        Immunization History  Administered Date(s) Administered  . Influenza Whole 03/26/2012, 04/10/2013  . Influenza, High Dose Seasonal PF 06/03/2016, 03/24/2017  . Influenza,inj,Quad PF,6+ Mos 05/15/2014, 03/25/2015   Screening Tests Health Maintenance  Topic Date Due  . TETANUS/TDAP  12/28/2020 (Originally 03/13/1960)  . HEMOGLOBIN A1C  06/21/2017  . FOOT EXAM  12/20/2017  . OPHTHALMOLOGY EXAM  03/30/2018  . INFLUENZA VACCINE  Completed  . DEXA SCAN  Addressed  . PNA vac Low Risk Adult  Excluded      Plan:    End of life planning; Advance aging;  Advanced directives discussed. Copy of current HCPOA/Living Will requested.    I have personally reviewed and noted the following in the patient's chart:   . Medical and social history . Use of alcohol, tobacco or illicit drugs  . Current medications and supplements . Functional ability and status . Nutritional status . Physical activity . Advanced directives . List of other physicians . Hospitalizations, surgeries, and ER visits in previous 12 months . Vitals . Screenings to include cognitive, depression, and falls . Referrals and appointments  In addition, I have reviewed and discussed with patient certain preventive protocols, quality metrics, and best practice recommendations. A written personalized care plan for preventive services as well as general preventive health recommendations were provided to patient.     Varney Biles, LPN  [id number]

## 2017-04-08 NOTE — Patient Instructions (Addendum)
  Ms. Felling , Thank you for taking time to come for your Medicare Wellness Visit. I appreciate your ongoing commitment to your health goals. Please review the following plan we discussed and let me know if I can assist you in the future.   Follow up with Dr. Caryl Bis as needed.    Bring a copy of your [place] and/or Living Will to be scanned into chart.  Have a great day!  These are the goals we discussed: Goals    . Maintain Healthy Lifestyle          Maintain exercise regimen of dancing 2 days, increase time as tolerated. Stay hydrated and drink plenty of water. Low carb foods. Educational material provided.       This is a list of the screening recommended for you and due dates:  Health Maintenance  Topic Date Due  . Tetanus Vaccine  12/28/2020*  . Hemoglobin A1C  06/21/2017  . Complete foot exam   12/20/2017  . Eye exam for diabetics  03/30/2018  . Flu Shot  Completed  . DEXA scan (bone density measurement)  Addressed  . Pneumonia vaccines  Excluded  *Topic was postponed. The date shown is not the original due date.

## 2017-04-20 ENCOUNTER — Ambulatory Visit (INDEPENDENT_AMBULATORY_CARE_PROVIDER_SITE_OTHER): Payer: Medicare Other

## 2017-04-20 DIAGNOSIS — I10 Essential (primary) hypertension: Secondary | ICD-10-CM

## 2017-04-20 DIAGNOSIS — N289 Disorder of kidney and ureter, unspecified: Secondary | ICD-10-CM | POA: Diagnosis not present

## 2017-04-27 ENCOUNTER — Encounter: Payer: Self-pay | Admitting: Internal Medicine

## 2017-04-27 ENCOUNTER — Other Ambulatory Visit: Payer: Self-pay | Admitting: Family Medicine

## 2017-04-27 ENCOUNTER — Ambulatory Visit (INDEPENDENT_AMBULATORY_CARE_PROVIDER_SITE_OTHER): Payer: Medicare Other | Admitting: Internal Medicine

## 2017-04-27 VITALS — BP 140/62 | HR 79 | Ht 62.5 in | Wt 174.5 lb

## 2017-04-27 DIAGNOSIS — I10 Essential (primary) hypertension: Secondary | ICD-10-CM

## 2017-04-27 DIAGNOSIS — I701 Atherosclerosis of renal artery: Secondary | ICD-10-CM | POA: Insufficient documentation

## 2017-04-27 DIAGNOSIS — I5032 Chronic diastolic (congestive) heart failure: Secondary | ICD-10-CM

## 2017-04-27 DIAGNOSIS — E7800 Pure hypercholesterolemia, unspecified: Secondary | ICD-10-CM | POA: Insufficient documentation

## 2017-04-27 NOTE — Patient Instructions (Signed)
Medication Instructions:  Your physician has recommended you make the following change in your medication:  1- STOP Hydrochlorothiazide.   Labwork: Your physician recommends that you return for lab work in: [place] AS BP CHECK IN THE OFFICE.   Testing/Procedures: none  Follow-Up: Your physician recommends that you schedule a follow-up appointment in: 1 MONTH FOR BP CHECK AND LAB WORK.  Your physician recommends that you schedule a follow-up appointment in: 4 MONTHS WITH DR END.   If you need a refill on your cardiac medications before your next appointment, please call your pharmacy.

## 2017-04-27 NOTE — Progress Notes (Signed)
Follow-up Outpatient Visit Date: 04/27/2017  Primary Care Provider: Leone Haven, MD [address] [place] [address]  Chief Complaint: Follow-up diastolic heart failure  HPI:  Kelsey Mason is a 76 y.o. year-old female with history of nonischemic cardiomyopathy with normalization of LVEF, mitral regurgitation, hypertension, hyperlipidemia, type 2 diabetes mellitus, and breast cancer, who presents for follow-up of diastolic heart failure and mitral regurgitation.  I last saw Ms. Qualls in July, at which time her only complaints were of bilateral leg and low back pain.  She was not having any chest pain or shortness of breath.  We agreed to a statin holiday to see if this improved her leg pain.  We have also been struggling with her blood pressure.  Today, Ms. Bevel reports that she is doing fairly well.  Her leg discomfort has improved with a statin holiday.  She continues to have some low back pain.  She denies chest pain, shortness of breath, and edema.  She notes a little bit of lightheadedness at times but has not fallen.  She has not had any palpitations.  Over the last few weeks, her home blood pressures have been 110-120/50-60.  At her most recent endocrinology appointment, referral to nephrology was recommended.  However, Ms. Trafton wished to defer this until after completion of her renal artery Doppler.  This revealed mild to moderate (less than 60%) stenosis of the right renal artery.  No significant left renal artery disease was identified.  --------------------------------------------------------------------------------------------------  Cardiovascular History & Procedures: Cardiovascular Problems:  Non-ischemic cardiomyopathy with normalization of LVEF  Mitral regurgitation  Risk Factors:  HTN, HLD, DM2, and age > 101  Cath/PCI:  None  CV Surgery:  None  EP Procedures and Devices:  None  Non-Invasive Evaluation(s):  Renal artery  Doppler (04/20/17): Mild to moderate (less than 60%) stenosis of the right renal artery.  No significant left renal artery disease.  TTE (01/14/17): Normal LV size with LVEF 50-55%. Anteroseptal hypokinesis is noted, as well as grade 1 diastolic dysfunction. Mild to moderate MR. Normal RV size and function. Mild pulmonary hypertension.  Pharmacologic myocardial perfusion stress test (06/16/16): Low risk study with moderate in size, moderate in severity, fixed defect involving the septum most likely related to LBBB. No evidence of ischemia. LVEF 50% by automated calculation and likely higher based on visual estimation.  ABIs (06/10/16): ABIs: Right not obtainable, left 1.4. TBIs right 1.1, left not obtainable. Bilateral great toe PPG's are normal. Bilateral common femoral, popliteal, peroneal, anterior tibial, and posterior tibial artery waveforms are brisk and triphasic.  TTE (02/23/16, [facility]): Mildly dilated left ventricle with mild to moderate LV dysfunction (EF 35-45%). Grade 1 diastolic dysfunction. Moderate mitral regurgitation. Mild left atrial enlargement. Normal RV function.  TTE (01/01/14, [facility]): Moderate to severe LV dysfunction (EF 30%) with mild LVH and mild left ventricular dilation. Mitral annular calcification with moderate MR and moderate TR noted. Normal right ventricular contraction.  MUGA (12/25/12): Normal contraction and wall motion. EF 63%.  Recent CV Pertinent Labs: Lab Results  Component Value Date   CHOL 149 12/20/2016   HDL 44.80 12/20/2016   LDLCALC 83 12/20/2016   LDLDIRECT 178.7 10/02/2012   TRIG 105.0 12/20/2016   CHOLHDL 3 12/20/2016   K 3.8 03/14/2017   K 4.7 01/07/2014   BUN 24 (H) 03/14/2017   BUN 15 02/28/2017   BUN 19 (H) 01/07/2014   CREATININE 1.43 (H) 03/14/2017   CREATININE 1.24 01/07/2014    Past  medical and surgical history were reviewed and updated in EPIC.  Current Meds  Medication Sig  . amLODipine (NORVASC) 5 MG tablet  Take 1 tablet (5 mg total) by mouth daily.  [name] aspirin 81 MG tablet Take 81 mg by mouth daily.  . blood glucose meter kit and supplies KIT Dispense based on patient and insurance preference. Use up to four times daily as directed. One Touch Ultra mini meter (FOR ICD-9 250.00, 250.01). Dx: E11.9  . Calcium Carb-Cholecalciferol (SM CALCIUM/VITAMIN D) 600-800 MG-UNIT TABS Take 600-800 Units by mouth daily.  . carvedilol (COREG) 6.25 MG tablet Take 1 tablet (6.25 mg total) by mouth 2 (two) times daily.  [name] glimepiride (AMARYL) 2 MG tablet TAKE 1 TABLET (2 MG TOTAL) BY MOUTH 2 (TWO) TIMES DAILY.  [name] glucose blood (ONE TOUCH ULTRA TEST) test strip USE TO TEST BLOOD SUGAR UP TO 4 TIMES DAILY  . hydrochlorothiazide (HYDRODIURIL) 25 MG tablet Take 1 tablet (25 mg total) by mouth daily.  . Insulin Pen Needle 32G X 6 MM MISC Use as directed with Lantus pen  . LANTUS SOLOSTAR 100 UNIT/ML Solostar Pen INJECT 20 UNITS INTO THE SKIN DAILY AT 10 PM.  . letrozole (FEMARA) 2.5 MG tablet TAKE 1 TABLET (2.5 MG TOTAL) BY MOUTH DAILY.  [name] losartan (COZAAR) 50 MG tablet Take 2 tablets (100 mg total) by mouth daily.  . pantoprazole (PROTONIX) 40 MG tablet Take 1 tablet (40 mg total) by mouth daily.    Allergies: Advil [ibuprofen]; Aleve [naproxen]; Fluticasone-salmeterol; Lipitor [atorvastatin]; Naproxen sodium; and Penicillins  Social History   Social History  . Marital status: Widowed    Spouse name: N/A  . Number of children: N/A  . Years of education: N/A   Occupational History  . Not on file.   Social History Main Topics  . Smoking status: Former Smoker    Packs/day: 0.25    Years: 1.00    Types: Cigarettes    Quit date: 1963  . Smokeless tobacco: Never Used  . Alcohol use No  . Drug use: No  . Sexual activity: No   Other Topics Concern  . Not on file   Social History Narrative   Lives in [place].       Works - [employer]   Diet - regular   Exercise - dance 3x per week    Family  History  Problem Relation Age of Onset  . Alcohol abuse Mother   . Heart disease Mother   . Hypertension Mother   . Breast cancer Mother 68  . Alcohol abuse Father   . Heart disease Father   . Hypertension Father   . Heart disease Brother     Review of Systems: A 12-system review of systems was performed and was negative except as noted in the HPI.  --------------------------------------------------------------------------------------------------  Physical Exam: BP 140/62 (BP Location: Left Arm, Patient Position: Sitting, Cuff Size: Normal)   Pulse 79   Ht 5' 2.5" (1.588 m)   Wt 174 lb 8 oz (79.2 kg)   BMI 31.41 kg/m   General: Obese woman, seated comfortably in the exam room. HEENT: No conjunctival pallor or scleral icterus. Moist mucous membranes.  OP clear. Neck: Supple without lymphadenopathy, thyromegaly, JVD, or HJR.  Lungs: Normal work of breathing. Clear to auscultation bilaterally without wheezes or crackles. Heart: Regular rate and rhythm without murmurs, rubs, or gallops. Non-displaced PMI. Abd: Bowel sounds present. Soft, NT/ND without hepatosplenomegaly Ext: Trace pretibial edema bilaterally. Skin: Warm and  dry.  EKG: Normal sinus rhythm with left bundle branch block.  Lab Results  Component Value Date   WBC 9.9 03/14/2017   HGB 13.1 03/14/2017   HCT 37.4 03/14/2017   MCV 85.7 03/14/2017   PLT 233 03/14/2017    Lab Results  Component Value Date   NA 130 (L) 03/14/2017   K 3.8 03/14/2017   CL 92 (L) 03/14/2017   CO2 27 03/14/2017   BUN 24 (H) 03/14/2017   CREATININE 1.43 (H) 03/14/2017   GLUCOSE 342 (H) 03/14/2017   ALT 21 03/14/2017    Lab Results  Component Value Date   CHOL 149 12/20/2016   HDL 44.80 12/20/2016   LDLCALC 83 12/20/2016   LDLDIRECT 178.7 10/02/2012   TRIG 105.0 12/20/2016   CHOLHDL 3 12/20/2016    --------------------------------------------------------------------------------------------------  ASSESSMENT AND  PLAN: Chronic diastolic heart failure Ms. Fahringer has trace ankle edema but otherwise appears euvolemic.  Some of this may reflect venous insufficiency.  Her weight is relatively stable.  She also denies heart failure symptoms, including shortness of breath and orthopnea.  We have agreed to discontinue HCTZ given worsening renal insufficiency and improved blood pressure control.  No further intervention at this time.  Hypertension Blood pressure today is mildly elevated.  However, Ms. Schou reports better readings at home.  Given some intermittent lightheadedness as well and worsening renal function, I have advised Ms. Myung to stop taking HCTZ and to increase her fluid intake.  We will repeat a basic metabolic panel in about a month.  If her creatinine has not improved (most recently 1.6 with Dr. Gabriel Carina), we will refer her to nephrology.  Renal artery stenosis Mild to moderate narrowing (less than 60%) noted in the right renal artery.  Continue medical therapy.  Hyperlipidemia Most recent LDL in June was 83, the patient is now off her statin due to myalgias.  Given improvement in leg pain, we trial of every other day rosuvastatin could be considered at follow-up (can also be started by Drs. Sonnenberg or Solum).  Follow-up: Return to clinic in 1 month for labs, 4 months for follow-up with me.  Nelva Bush, MD 04/27/2017 10:24 AM

## 2017-05-09 ENCOUNTER — Ambulatory Visit: Payer: Medicare Other | Admitting: Family

## 2017-05-09 VITALS — BP 142/58 | HR 85 | Temp 98.2°F | Ht 62.5 in | Wt 176.0 lb

## 2017-05-09 DIAGNOSIS — I10 Essential (primary) hypertension: Secondary | ICD-10-CM

## 2017-05-09 DIAGNOSIS — E1122 Type 2 diabetes mellitus with diabetic chronic kidney disease: Secondary | ICD-10-CM | POA: Diagnosis not present

## 2017-05-09 DIAGNOSIS — N183 Chronic kidney disease, stage 3 (moderate): Secondary | ICD-10-CM

## 2017-05-09 NOTE — Progress Notes (Signed)
Pre visit review using our clinic review tool, if applicable. No additional management support is needed unless otherwise documented below in the visit note. 

## 2017-05-09 NOTE — Patient Instructions (Addendum)
Labs today  Monitor blood pressure,  Goal is less than 130/80; if persistently higher, please make sooner follow up appointment so we can recheck you blood pressure and manage medications  Follow up with [facility]

## 2017-05-09 NOTE — Progress Notes (Signed)
Subjective:    Patient ID: Kelsey Mason, female    DOB: 11-27-1940, 76 y.o.   MRN: [id number]  CC: Kelsey Mason is a 76 y.o. female who presents today for follow up.   HPI: No complaints today. Here today to discuss decreasing kidney function. Feeling well. Denies any problems urinating, dysuria, frequency,fever.  HTN- at home 111-130/ 50 on average. Compliant with medications.  Denies exertional chest pain or pressure, numbness or tingling radiating to left arm or jaw, palpitations, dizziness, frequent headaches, changes in vision, or shortness of breath.     Patient recently seen by cardiology 10/24 for cardiomyopathy, hypertension, hyperlipidemia, diabetes. Per cardiology note, in  recent in the endocrine appointment, referral to nephrology was recommended. Renal artery Doppler showed mild to moderate stenosis of right renal artery ( unable to see this in epic).   Crt increased from 1.08 to 1.43  H/o breast cancer 2014- s/p chemo and radiation; Dr Thurman Coyer  Dr Gabriel Carina- 03/2017    HISTORY:  Past Medical History:  Diagnosis Date  . Breast symptom 2014  . Cancer [facility]) 2014   Bilateral Breast, chemo Dr. Jeb Levering  . Diabetes mellitus   . GERD (gastroesophageal reflux disease)    mild  . Hypertension   . Personal history of chemotherapy   . Personal history of radiation therapy    Past Surgical History:  Procedure Laterality Date  . ABDOMINAL HYSTERECTOMY  1976   for pelvic pain  . BREAST EXCISIONAL BIOPSY Bilateral 11/2012   bilat breast ca chemo and rad  . BREAST SURGERY Bilateral 2014   bilateral lumpectomy and SN biopsy  . CHOLECYSTECTOMY  2007  . OOPHORECTOMY Right   . PORTACATH PLACEMENT  2014  . TUMOR REMOVAL Right 1980   right foot   Family History  Problem Relation Age of Onset  . Alcohol abuse Mother   . Heart disease Mother   . Hypertension Mother   . Breast cancer Mother 45  . Alcohol abuse Father   . Heart disease Father   .  Hypertension Father   . Heart disease Brother     Allergies: Advil [ibuprofen]; Aleve [naproxen]; Fluticasone-salmeterol; Lipitor [atorvastatin]; Naproxen sodium; and Penicillins Current Outpatient Medications on File Prior to Visit  Medication Sig Dispense Refill  . amLODipine (NORVASC) 5 MG tablet Take 1 tablet (5 mg total) by mouth daily. 90 tablet 3  . aspirin 81 MG tablet Take 81 mg by mouth daily.    . blood glucose meter kit and supplies KIT Dispense based on patient and insurance preference. Use up to four times daily as directed. One Touch Ultra mini meter (FOR ICD-9 250.00, 250.01). Dx: E11.9 1 each 0  . Calcium Carb-Cholecalciferol (SM CALCIUM/VITAMIN D) 600-800 MG-UNIT TABS Take 600-800 Units by mouth daily. 90 tablet 3  . carvedilol (COREG) 6.25 MG tablet Take 1 tablet (6.25 mg total) by mouth 2 (two) times daily. 180 tablet 3  . glimepiride (AMARYL) 2 MG tablet TAKE 1 TABLET (2 MG TOTAL) BY MOUTH 2 (TWO) TIMES DAILY. 180 tablet 1  . glucose blood (ONE TOUCH ULTRA TEST) test strip USE TO TEST BLOOD SUGAR UP TO 4 TIMES DAILY 100 each 6  . LANTUS SOLOSTAR 100 UNIT/ML Solostar Pen INJECT 20 UNITS INTO THE SKIN DAILY AT 10 PM. 18 pen 0  . letrozole (FEMARA) 2.5 MG tablet TAKE 1 TABLET (2.5 MG TOTAL) BY MOUTH DAILY. 90 tablet 2  . losartan (COZAAR) 50 MG tablet Take 2 tablets (100 mg total)  by mouth daily. 90 tablet 3  . NOVOFINE 32G X 6 MM MISC USE AS DIRECTED WITH LANTUS PEN 100 each 2  . pantoprazole (PROTONIX) 40 MG tablet Take 1 tablet (40 mg total) by mouth daily. 90 tablet 1   No current facility-administered medications on file prior to visit.     Social History   Tobacco Use  . Smoking status: Former Smoker    Packs/day: 0.25    Years: 1.00    Pack years: 0.25    Types: Cigarettes    Last attempt to quit: 1963    Years since quitting: 55.8  . Smokeless tobacco: Never Used  Substance Use Topics  . Alcohol use: No    Alcohol/week: 0.0 oz  . Drug use: No     Review of Systems  Constitutional: Negative for chills and fever.  Respiratory: Negative for cough.   Cardiovascular: Negative for chest pain and palpitations.  Gastrointestinal: Negative for nausea and vomiting.  Genitourinary: Negative for difficulty urinating and dysuria.      Objective:    BP (!) 142/58   Pulse 85   Temp 98.2 F (36.8 C) (Oral)   Ht 5' 2.5" (1.588 m)   Wt 176 lb (79.8 kg)   SpO2 97%   BMI 31.68 kg/m  BP Readings from Last 3 Encounters:  05/09/17 (!) 142/58  04/27/17 140/62  04/08/17 122/62   Wt Readings from Last 3 Encounters:  05/09/17 176 lb (79.8 kg)  04/27/17 174 lb 8 oz (79.2 kg)  04/08/17 172 lb (78 kg)    Physical Exam  Constitutional: She appears well-developed and well-nourished.  Eyes: Conjunctivae are normal.  Cardiovascular: Normal rate, regular rhythm, normal heart sounds and normal pulses.  Pulmonary/Chest: Effort normal and breath sounds normal. She has no wheezes. She has no rhonchi. She has no rales.  Neurological: She is alert.  Skin: Skin is warm and dry.  Psychiatric: She has a normal mood and affect. Her speech is normal and behavior is normal. Thought content normal.  Vitals reviewed.      Assessment & Plan:   Problem List Items Addressed This Visit      Cardiovascular and Mediastinum   Hypertension (Chronic)    Elevated today however patient reports better control at home. She has strong understanding of blood pressure, she will monitor blood pressure over the next couple days and let us know if  persistly elevated as her regimen may need to be titrated.        Endocrine   CKD stage 3 due to type 2 diabetes mellitus ([place]) - Primary    Pending renal studies. Referral to nephrology for further consult.      Relevant Orders   Ambulatory referral to Nephrology   Comprehensive metabolic panel       I am having Revonda Standard maintain her aspirin, blood glucose meter kit and supplies, Calcium  Carb-Cholecalciferol, glucose blood, carvedilol, amLODipine, letrozole, glimepiride, pantoprazole, losartan, LANTUS SOLOSTAR, and NOVOFINE.   No orders of the defined types were placed in this encounter.   Return precautions given.   Risks, benefits, and alternatives of the medications and treatment plan prescribed today were discussed, and patient expressed understanding.   Education regarding symptom management and diagnosis given to patient on AVS.  Continue to follow with Leone Haven, MD for routine health maintenance.   Zollie Beckers and I agreed with plan.   Mable Paris, FNP

## 2017-05-10 LAB — COMPREHENSIVE METABOLIC PANEL
ALT: 15 U/L (ref 0–35)
AST: 16 U/L (ref 0–37)
Albumin: 4.1 g/dL (ref 3.5–5.2)
Alkaline Phosphatase: 76 U/L (ref 39–117)
BUN: 15 mg/dL (ref 6–23)
CALCIUM: 9.6 mg/dL (ref 8.4–10.5)
CHLORIDE: 99 meq/L (ref 96–112)
CO2: 28 meq/L (ref 19–32)
Creatinine, Ser: 1.26 mg/dL — ABNORMAL HIGH (ref 0.40–1.20)
GFR: 43.86 mL/min — AB (ref >60.00)
GLUCOSE: 124 mg/dL — AB (ref 70–99)
POTASSIUM: 4.6 meq/L (ref 3.5–5.1)
Sodium: 135 mEq/L (ref 135–145)
Total Bilirubin: 0.7 mg/dL (ref 0.2–1.2)
Total Protein: 6.3 g/dL (ref 6.0–8.3)

## 2017-05-10 NOTE — Assessment & Plan Note (Signed)
Elevated today however patient reports better control at home. She has strong understanding of blood pressure, she will monitor blood pressure over the next couple days and let us know if  persistly elevated as her regimen may need to be titrated.

## 2017-05-10 NOTE — Assessment & Plan Note (Signed)
Pending renal studies. Referral to nephrology for further consult.

## 2017-05-27 ENCOUNTER — Other Ambulatory Visit: Payer: Self-pay | Admitting: Hematology and Oncology

## 2017-05-27 DIAGNOSIS — C50411 Malignant neoplasm of upper-outer quadrant of right female breast: Secondary | ICD-10-CM

## 2017-05-30 ENCOUNTER — Ambulatory Visit (INDEPENDENT_AMBULATORY_CARE_PROVIDER_SITE_OTHER): Payer: Medicare Other

## 2017-05-30 ENCOUNTER — Other Ambulatory Visit (INDEPENDENT_AMBULATORY_CARE_PROVIDER_SITE_OTHER): Payer: Medicare Other

## 2017-05-30 VITALS — BP 140/72 | HR 76 | Resp 16 | Wt 175.4 lb

## 2017-05-30 DIAGNOSIS — I38 Endocarditis, valve unspecified: Secondary | ICD-10-CM | POA: Diagnosis not present

## 2017-05-30 DIAGNOSIS — I10 Essential (primary) hypertension: Secondary | ICD-10-CM

## 2017-05-30 DIAGNOSIS — I5032 Chronic diastolic (congestive) heart failure: Secondary | ICD-10-CM

## 2017-05-30 DIAGNOSIS — I5022 Chronic systolic (congestive) heart failure: Secondary | ICD-10-CM | POA: Diagnosis not present

## 2017-05-30 NOTE — Patient Instructions (Signed)
1.) Reason for visit: BP check  2.) Name of MD requesting visit: Dr. Harrell Gave End  3.) H&P: CHF, HTN, renal artery stenosis  4.) ROS related to problem: Pt was instructed to stop taking HCTZ at 10/24 OV due to worsening renal insufficiency and improved BP. She reports feeling well; confirmed she is taking all medications as prescribed. Since d/c'ing HCTZ, she has had slight left ankle and foot edema at 7pm each evening; resolves overnight. Pt has had "issues" with this foot for 17 years after falling off a wall. She has never been seen for sx.   5.) Assessment and plan per MD: BP controlled today at 140/76; HR 76. She will continue medications as prescribed, monitor pressures at home and notify us if SBP >140 consistently. Advised to make appt w/a foot and ankle center to address foot concerns. Routed to Dr. Saunders Revel.

## 2017-05-31 LAB — BASIC METABOLIC PANEL
BUN / CREAT RATIO: 15 (ref 12–28)
BUN: 19 mg/dL (ref 8–27)
CALCIUM: 9.2 mg/dL (ref 8.7–10.3)
CO2: 23 mmol/L (ref 20–29)
Chloride: 100 mmol/L (ref 96–106)
Creatinine, Ser: 1.27 mg/dL — ABNORMAL HIGH (ref 0.57–1.00)
GFR, EST AFRICAN AMERICAN: 47 mL/min/{1.73_m2} — AB (ref >59)
GFR, EST NON AFRICAN AMERICAN: 41 mL/min/{1.73_m2} — AB (ref >59)
Glucose: 117 mg/dL — ABNORMAL HIGH (ref 65–99)
Potassium: 4.6 mmol/L (ref 3.5–5.2)
Sodium: 139 mmol/L (ref 134–144)

## 2017-06-20 ENCOUNTER — Other Ambulatory Visit: Payer: Self-pay

## 2017-06-20 NOTE — Telephone Encounter (Signed)
Last Ov with Mable Paris 05/09/17 last filled by Dr.Cook 12/14/16 180 1rf

## 2017-06-21 ENCOUNTER — Other Ambulatory Visit: Payer: Self-pay

## 2017-06-21 MED ORDER — GLIMEPIRIDE 2 MG PO TABS: ORAL_TABLET | ORAL | 1 refills | Status: DC

## 2017-06-21 NOTE — Telephone Encounter (Signed)
Last OV 05/09/17 with Mable Paris last filled by [place] 12/14/16 180 1rf

## 2017-07-26 ENCOUNTER — Ambulatory Visit: Payer: Medicare Other | Admitting: Family Medicine

## 2017-07-26 ENCOUNTER — Encounter: Payer: Self-pay | Admitting: Family Medicine

## 2017-07-26 ENCOUNTER — Other Ambulatory Visit: Payer: Self-pay

## 2017-07-26 DIAGNOSIS — N183 Chronic kidney disease, stage 3 (moderate): Secondary | ICD-10-CM | POA: Diagnosis not present

## 2017-07-26 DIAGNOSIS — J069 Acute upper respiratory infection, unspecified: Secondary | ICD-10-CM

## 2017-07-26 DIAGNOSIS — Z794 Long term (current) use of insulin: Secondary | ICD-10-CM | POA: Diagnosis not present

## 2017-07-26 DIAGNOSIS — I10 Essential (primary) hypertension: Secondary | ICD-10-CM | POA: Diagnosis not present

## 2017-07-26 DIAGNOSIS — E1122 Type 2 diabetes mellitus with diabetic chronic kidney disease: Secondary | ICD-10-CM | POA: Diagnosis not present

## 2017-07-26 DIAGNOSIS — Z853 Personal history of malignant neoplasm of breast: Secondary | ICD-10-CM

## 2017-07-26 NOTE — Progress Notes (Signed)
[name], MD Phone: [phone]  Kelsey Mason is a 77 y.o. female who presents today for follow-up.  Hypertension: Typically 112-160/50-70.  Taking losartan, carvedilol, Lasix, and amlodipine.  No chest pain or shortness of breath.  Does occasionally feel lightheaded on rising as well as at times when she is seated.  Diabetes: Typically ranges between 80 and 134.  Taking Lantus 46 units.  Also, pride.  No polyuria or polydipsia.  Occasional hypoglycemia down into the 70s.  She will drink orange juice and this will improve.  She continues to follow with oncology for breast cancer.  She continues on letrozole.  She notes no issues currently.  For the last 3 weeks she has felt slightly sick.  She felt weak initially and did not want to get out of bed.  The next week she developed a little bit of nausea and then also some cough and congestion.  She notes some sneezing.  Some postnasal drip and sore throat.  Clear mucus out of her nose.  Clear productive cough.  No fevers.  She reports she has been improving.  She is tried Robitussin as well as VapoRub.  Social History   Tobacco Use  Smoking Status Former Smoker  . Packs/day: 0.25  . Years: 1.00  . Pack years: 0.25  . Types: Cigarettes  . Last attempt to quit: 1963  . Years since quitting: 56.0  Smokeless Tobacco Never Used     ROS see history of present illness  Objective  Physical Exam Vitals:   07/26/17 1456  BP: 140/70  Pulse: 76  Temp: 98.7 F (37.1 C)  SpO2: 98%   Lang blood pressure 166/86 pulse 81 Sitting blood pressure 142/70 pulse 81 Standing blood pressure 132/80 pulse 89  BP Readings from Last 3 Encounters:  07/26/17 140/70  05/30/17 140/72  05/09/17 (!) 142/58   Wt Readings from Last 3 Encounters:  07/26/17 172 lb 3.2 oz (78.1 kg)  05/30/17 175 lb 6.4 oz (79.6 kg)  05/09/17 176 lb (79.8 kg)    Physical Exam  Constitutional: No distress.  HENT:  Head: Normocephalic and atraumatic.    Mouth/Throat: Oropharynx is clear and moist.  Normal TMs  Eyes: Conjunctivae are normal. Pupils are equal, round, and reactive to light.  Neck: Neck supple.  Cardiovascular: Normal rate, regular rhythm and normal heart sounds.  Pulmonary/Chest: Effort normal and breath sounds normal.  Musculoskeletal: She exhibits no edema.  Lymphadenopathy:    She has no cervical adenopathy.  Neurological: She is alert. Gait normal.  Skin: Skin is warm and dry. She is not diaphoretic.     Assessment/Plan: Please see individual problem list.  Hypertension Patient is orthostatic.  Notes some low blood pressures at home.  Some lightheadedness as well.  We will have her discontinue her amlodipine.  She will monitor daily.  She will return in 1 week for BP check.  She will contact us if her blood pressures become elevated at home.  Type 2 diabetes mellitus with stage 3 chronic kidney disease, with long-term current use of insulin ([place]) Most recent A1c well controlled.  She will continue her current regimen.  History of breast cancer She will continue to follow with oncology.  URI (upper respiratory infection) Symptoms most consistent with viral illness.  No focal findings to indicate bacterial illness.  She has been improving.  She will continue her current regimen and monitor.  If worsening she will let us know.   No orders of the defined types were  placed in this encounter.   No orders of the defined types were placed in this encounter.    [name], MD [place]

## 2017-07-26 NOTE — Assessment & Plan Note (Signed)
She will continue to follow with oncology. 

## 2017-07-26 NOTE — Assessment & Plan Note (Signed)
Patient is orthostatic.  Notes some low blood pressures at home.  Some lightheadedness as well.  We will have her discontinue her amlodipine.  She will monitor daily.  She will return in 1 week for BP check.  She will contact us if her blood pressures become elevated at home.

## 2017-07-26 NOTE — Patient Instructions (Signed)
Nice to see you. Please discontinue the amlodipine.  Please monitor your blood pressure.  If it starts to rise please let us know.  We will have you return in 1 week for blood pressure check. Please monitor your congestion and cough.  If it does not continue to improve please let us know.

## 2017-07-26 NOTE — Assessment & Plan Note (Signed)
Symptoms most consistent with viral illness.  No focal findings to indicate bacterial illness.  She has been improving.  She will continue her current regimen and monitor.  If worsening she will let us know.

## 2017-07-26 NOTE — Assessment & Plan Note (Signed)
Most recent A1c well controlled.  She will continue her current regimen.

## 2017-08-02 ENCOUNTER — Ambulatory Visit (INDEPENDENT_AMBULATORY_CARE_PROVIDER_SITE_OTHER): Payer: Medicare Other | Admitting: *Deleted

## 2017-08-02 VITALS — BP 140/82 | HR 76 | Resp 18

## 2017-08-02 DIAGNOSIS — I10 Essential (primary) hypertension: Secondary | ICD-10-CM | POA: Diagnosis not present

## 2017-08-02 NOTE — Progress Notes (Signed)
Patient in for 1 week BP check after D/C amlodipine BP in left arm 140/88 pulse 76, patient also brought in home wrist cuff reading was 135/74 with home cuff.

## 2017-08-05 NOTE — Progress Notes (Signed)
Patient's blood pressure is stable off of amlodipine.  She should continue to monitor at home and if it starts to go up she should let us know.

## 2017-08-12 NOTE — Progress Notes (Signed)
Patient notified

## 2017-08-30 NOTE — Progress Notes (Signed)
Follow-up Outpatient Visit Date: 08/31/2017  Primary Care Provider: Leone Haven, MD [address] [place] [address]  Chief Complaint: Leg pain  HPI:  Ms. Weitman is a 77 y.o. year-old female with history of nonischemic cardiomyopathy with normalization of LVEF, mitral regurgitation, hypertension, hyperlipidemia, type 2 diabetes mellitus, mild-moderate renal artery stenosis, and breast cancer, who presents for follow-up of chronic diastolic heart failure and mitral regurgitation. I last saw Ms. Vorhees in late October, 2018, at which time she was doing fairly well. She noted improved leg pain with a statin holiday. Due to worsening renal function, we decided to stop HCTZ. Follow-up labs in November showed improved creatinine.  Today, Ms. Penza reports feeling well.  Her only complaint is of continued intermittent low back and leg discomfort.  This is most noticeable when she is dancing, which she does 2-3 times a week.  She will sometimes need to stop and sit down for the pain to resolve.  She does not have any rest pain or wounds on her feet.  She denies chest pain, shortness of breath, palpitations, lightheadedness, and orthopnea.  She notes occasional mild leg edema that resolves with her as needed furosemide.  She is currently taking this every other day, on average.  Ms. Lahmann was seen by nephrology earlier this week.  No medication changes or additional testing were pursued.  She reports follow-up with them late next month.  No further dizziness after HCTZ and amlodipine were stopped over the last few months.  --------------------------------------------------------------------------------------------------  Cardiovascular History & Procedures: Cardiovascular Problems:  Non-ischemic cardiomyopathy with normalization of LVEF  Mitral regurgitation  Risk Factors:  HTN, HLD, DM2, and age > 24  Cath/PCI:  None  CV Surgery:  None  EP Procedures and  Devices:  None  Non-Invasive Evaluation(s):  Renal artery Doppler (04/20/17): Mild to moderate (less than 60%) stenosis of the right renal artery.  No significant left renal artery disease.  TTE (01/14/17): Normal LV size with LVEF 50-55%. Anteroseptal hypokinesis is noted, as well as grade 1 diastolic dysfunction. Mild to moderate MR. Normal RV size and function. Mild pulmonary hypertension.  Pharmacologic myocardial perfusion stress test (06/16/16): Low risk study with moderate in size, moderate in severity, fixed defect involving the septum most likely related to LBBB. No evidence of ischemia. LVEF 50% by automated calculation and likely higher based on visual estimation.  ABIs (06/10/16): ABIs: Right not obtainable, left 1.4. TBIs right 1.1, left not obtainable. Bilateral great toe PPG's are normal. Bilateral common femoral, popliteal, peroneal, anterior tibial, and posterior tibial artery waveforms are brisk and triphasic.  TTE (02/23/16, [facility]): Mildly dilated left ventricle with mild to moderate LV dysfunction (EF 35-45%). Grade 1 diastolic dysfunction. Moderate mitral regurgitation. Mild left atrial enlargement. Normal RV function.  TTE (01/01/14, [facility]): Moderate to severe LV dysfunction (EF 30%) with mild LVH and mild left ventricular dilation. Mitral annular calcification with moderate MR and moderate TR noted. Normal right ventricular contraction.  MUGA (12/25/12): Normal contraction and wall motion. EF 63%.  Recent CV Pertinent Labs: Lab Results  Component Value Date   CHOL 149 12/20/2016   HDL 44.80 12/20/2016   LDLCALC 83 12/20/2016   LDLDIRECT 178.7 10/02/2012   TRIG 105.0 12/20/2016   CHOLHDL 3 12/20/2016   K 4.6 05/30/2017   K 4.7 01/07/2014   BUN 19 05/30/2017   BUN 19 (H) 01/07/2014   CREATININE 1.27 (H) 05/30/2017   CREATININE 1.24 01/07/2014    Past medical  and surgical history were reviewed and updated in EPIC.  Current Meds  Medication  Sig  . aspirin 81 MG tablet Take 81 mg by mouth daily.  . blood glucose meter kit and supplies KIT Dispense based on patient and insurance preference. Use up to four times daily as directed. One Touch Ultra mini meter (FOR ICD-9 250.00, 250.01). Dx: E11.9  . carvedilol (COREG) 6.25 MG tablet Take 1 tablet (6.25 mg total) by mouth 2 (two) times daily.  . furosemide (LASIX) 20 MG tablet Take 20 mg by mouth daily as needed.  [name] glimepiride (AMARYL) 2 MG tablet TAKE 1 TABLET (2 MG TOTAL) BY MOUTH 2 (TWO) TIMES DAILY.  [name] glucose blood (ONE TOUCH ULTRA TEST) test strip USE TO TEST BLOOD SUGAR UP TO 4 TIMES DAILY  . LANTUS SOLOSTAR 100 UNIT/ML Solostar Pen INJECT 20 UNITS INTO THE SKIN DAILY AT 10 PM. (Patient taking differently: Inject 46 Units into the skin daily at 10 pm. )  . letrozole (FEMARA) 2.5 MG tablet TAKE 1 TABLET BY MOUTH EVERY DAY  . NOVOFINE 32G X 6 MM MISC USE AS DIRECTED WITH LANTUS PEN    Allergies: Advil [ibuprofen]; Aleve [naproxen]; Fluticasone-salmeterol; Lipitor [atorvastatin]; Naproxen sodium; and Penicillins  Social History   Socioeconomic History  . Marital status: Widowed    Spouse name: Not on file  . Number of children: Not on file  . Years of education: Not on file  . Highest education level: Not on file  Social Needs  . Financial resource strain: Not on file  . Food insecurity - worry: Not on file  . Food insecurity - inability: Not on file  . Transportation needs - medical: Not on file  . Transportation needs - non-medical: Not on file  Occupational History  . Not on file  Tobacco Use  . Smoking status: Former Smoker    Packs/day: 0.25    Years: 1.00    Pack years: 0.25    Types: Cigarettes    Last attempt to quit: 1963    Years since quitting: 56.1  . Smokeless tobacco: Never Used  Substance and Sexual Activity  . Alcohol use: No    Alcohol/week: 0.0 oz  . Drug use: No  . Sexual activity: No  Other Topics Concern  . Not on file  Social History  Narrative   Lives in [place].       Works - [employer]   Diet - regular   Exercise - dance 3x per week    Family History  Problem Relation Age of Onset  . Alcohol abuse Mother   . Heart disease Mother   . Hypertension Mother   . Breast cancer Mother 70  . Alcohol abuse Father   . Heart disease Father   . Hypertension Father   . Heart disease Brother     Review of Systems: Ms. Parslow reports sharp pain in the epigastrium when bending over to pull up her stockings.  She also feels like it takes her breath away for a second.  Otherwise, a 12-system review of systems was performed and was negative except as noted in the HPI.  --------------------------------------------------------------------------------------------------  Physical Exam: BP (!) 156/68 (BP Location: Left Arm, Patient Position: Sitting, Cuff Size: Normal)   Pulse 85   Ht 5' 2.5" (1.588 m)   Wt 174 lb (78.9 kg)   BMI 31.32 kg/m   General: Obese woman, seated comfortably in the exam room. HEENT: No conjunctival pallor or scleral icterus. Moist  mucous membranes.  OP clear. Neck: Supple without lymphadenopathy, thyromegaly, JVD, or HJR. Lungs: Normal work of breathing. Clear to auscultation bilaterally without wheezes or crackles. Heart: Regular rate and rhythm with 2/6 holosystolic murmur loudest at the left lower sternal border.  No rubs or gallops.  Nondisplaced PMI. Abd: Bowel sounds present. Soft, NT/ND without hepatosplenomegaly Ext: No lower extremity edema.  2+ radial and 1+ pedal pulses bilaterally. Skin: Warm and dry without rash.  EKG: Normal sinus rhythm with left axis deviation and left bundle branch block.  No significant change since 04/27/17.  Lab Results  Component Value Date   WBC 9.9 03/14/2017   HGB 13.1 03/14/2017   HCT 37.4 03/14/2017   MCV 85.7 03/14/2017   PLT 233 03/14/2017    Lab Results  Component Value Date   NA 139 05/30/2017   K 4.6 05/30/2017   CL 100 05/30/2017     CO2 23 05/30/2017   BUN 19 05/30/2017   CREATININE 1.27 (H) 05/30/2017   GLUCOSE 117 (H) 05/30/2017   ALT 15 05/09/2017    Lab Results  Component Value Date   CHOL 149 12/20/2016   HDL 44.80 12/20/2016   LDLCALC 83 12/20/2016   LDLDIRECT 178.7 10/02/2012   TRIG 105.0 12/20/2016   CHOLHDL 3 12/20/2016    --------------------------------------------------------------------------------------------------  ASSESSMENT AND PLAN: Chronic diastolic heart failure Ms. Beaubien appears euvolemic and well compensated on exam today.  Weight is fairly stable.  She is currently asymptomatic.  Due to elevated blood pressure, we have agreed to increase carvedilol to 12.5 mg twice daily.  I will continue her current dose of losartan 100 mg daily.  Hypertension Blood pressure is suboptimally controlled today.  Of note, HCTZ and amlodipine have been stopped over the last several months due to dizziness.  We discussed the importance of blood pressure control, given Ms. Scalzo is diabetes, cardiomyopathy with valvular heart disease, and chronic kidney disease.  We have agreed to increase carvedilol to 12.5 mg twice daily.  Hyperlipidemia Most recent LDL in June was 83.  However, Ms. Merlin has been off statin therapy for several months due to myalgias.  It turns out that her leg discomfort has not improved significantly with a statin holiday.  We have agreed to restart rosuvastatin 5 mg daily.  We will obtain a lipid panel and ALT in about 3 months.  Chronic kidney disease stage III Ms. Locatelli is now following with nephrology.  Carvedilol will be increased, as above, for improved blood pressure control.  Renal artery Doppler last year showed mild to moderate right renal artery stenosis, for which we will continue medical therapy.  Leg pain This could be due to a number of factors, including vascular disease and low back problems (pseudoclaudication).  Pulses are mildly diminished on exam today, though  vascular studies in 2017 did not show evidence of significant arterial obstruction in either lower extremity.  We have agreed to defer additional testing at this time, as CT or conventional angiography would necessitate iodinated contrast and potentially worsen her renal function.  Follow-up: Return to clinic in 6 months.  Nelva Bush, MD 08/31/2017 8:11 AM

## 2017-08-31 ENCOUNTER — Encounter: Payer: Self-pay | Admitting: Internal Medicine

## 2017-08-31 ENCOUNTER — Ambulatory Visit: Payer: Medicare Other | Admitting: Internal Medicine

## 2017-08-31 VITALS — BP 156/68 | HR 85 | Ht 62.5 in | Wt 174.0 lb

## 2017-08-31 DIAGNOSIS — I5032 Chronic diastolic (congestive) heart failure: Secondary | ICD-10-CM

## 2017-08-31 DIAGNOSIS — I10 Essential (primary) hypertension: Secondary | ICD-10-CM | POA: Diagnosis not present

## 2017-08-31 DIAGNOSIS — M79605 Pain in left leg: Secondary | ICD-10-CM

## 2017-08-31 DIAGNOSIS — N183 Chronic kidney disease, stage 3 unspecified: Secondary | ICD-10-CM

## 2017-08-31 DIAGNOSIS — Z79899 Other long term (current) drug therapy: Secondary | ICD-10-CM

## 2017-08-31 DIAGNOSIS — M79604 Pain in right leg: Secondary | ICD-10-CM

## 2017-08-31 DIAGNOSIS — E785 Hyperlipidemia, unspecified: Secondary | ICD-10-CM

## 2017-08-31 MED ORDER — CARVEDILOL 12.5 MG PO TABS: 12.5000 mg | ORAL_TABLET | Freq: Two times a day (BID) | ORAL | 3 refills | Status: DC

## 2017-08-31 MED ORDER — ROSUVASTATIN CALCIUM 5 MG PO TABS: 5.0000 mg | ORAL_TABLET | Freq: Every day | ORAL | 3 refills | Status: DC

## 2017-08-31 NOTE — Patient Instructions (Addendum)
Medication Instructions:  Your physician has recommended you make the following change in your medication:  1- INCREASE Carvedilol to 12.5 mg by mouth two times a day. 2- START Rosuvastatin 5 mg (1 tablet) by mouth once a day in the evening.   Labwork: Your physician recommends that you return for lab work in: [place]. - Please go to the [facility]. You will check in at the front desk to the right as you walk into the atrium. Valet Parking is offered if needed. - You will need to be FASTING. - ON OR AROUND MAY 27TH.   Testing/Procedures: none  Follow-Up: Your physician wants you to follow-up in: 6 MONTHS WITH DR END. You will receive a reminder letter in the mail two months in advance. If you don't receive a letter, please call our office to schedule the follow-up appointment.  If you need a refill on your cardiac medications before your next appointment, please call your pharmacy.

## 2017-09-07 ENCOUNTER — Other Ambulatory Visit: Payer: Self-pay

## 2017-09-07 ENCOUNTER — Encounter: Payer: Self-pay | Admitting: Radiation Oncology

## 2017-09-07 ENCOUNTER — Ambulatory Visit
Admission: RE | Admit: 2017-09-07 | Discharge: 2017-09-07 | Disposition: A | Discharge status: Home / Self Care | Payer: Medicare Other | Source: Ambulatory Visit | Attending: Radiation Oncology | Admitting: Radiation Oncology

## 2017-09-07 VITALS — BP 156/70 | Temp 97.5°F | Resp 20 | Wt 176.4 lb

## 2017-09-07 DIAGNOSIS — Z170 Estrogen receptor positive status [ER+]: Secondary | ICD-10-CM | POA: Diagnosis not present

## 2017-09-07 DIAGNOSIS — C50312 Malignant neoplasm of lower-inner quadrant of left female breast: Secondary | ICD-10-CM | POA: Insufficient documentation

## 2017-09-07 DIAGNOSIS — Z923 Personal history of irradiation: Secondary | ICD-10-CM | POA: Insufficient documentation

## 2017-09-07 DIAGNOSIS — C50911 Malignant neoplasm of unspecified site of right female breast: Secondary | ICD-10-CM | POA: Insufficient documentation

## 2017-09-07 DIAGNOSIS — C50411 Malignant neoplasm of upper-outer quadrant of right female breast: Secondary | ICD-10-CM

## 2017-09-07 DIAGNOSIS — Z79811 Long term (current) use of aromatase inhibitors: Secondary | ICD-10-CM | POA: Diagnosis not present

## 2017-09-07 NOTE — Progress Notes (Signed)
Radiation Oncology Follow up Note  Name: Kelsey Mason   Date:   09/07/2017 MRN:  [id number] DOB: 02-03-1941    This 77 y.o. female presents to the clinic today for for your follow-up status post bilateral breast radiation.[name]  REFERRING PROVIDER: Coral Spikes, DO  HPI: Patient is a 77 year old female now 4 years out having completed bilateral breast radiation. She is seen today in. Routine follow-up and is doing well. She's currently on Femara tolerate that well without side effect. She specifically denies breast tenderness cough or bone pain. She had bilateral mammograms performed back in September 2018 which I have reviewed were BI-RADS 2 benign. She has follow-up mammograms ordered for next September.  COMPLICATIONS OF TREATMENT: none  FOLLOW UP COMPLIANCE: keeps appointments   PHYSICAL EXAM:  BP (!) 156/70   Temp (!) 97.5 F (36.4 C)   Resp 20   Wt 176 lb 5.9 oz (80 kg)   BMI 31.74 kg/m  Lungs are clear to A&P cardiac examination essentially unremarkable with regular rate and rhythm. No dominant mass or nodularity is noted in either breast in 2 positions examined. Incision is well-healed. No axillary or supraclavicular adenopathy is appreciated. Cosmetic result is excellent. Well-developed well-nourished patient in NAD. HEENT reveals PERLA, EOMI, discs not visualized.  Oral cavity is clear. No oral mucosal lesions are identified. Neck is clear without evidence of cervical or supraclavicular adenopathy. Lungs are clear to A&P. Cardiac examination is essentially unremarkable with regular rate and rhythm without murmur rub or thrill. Abdomen is benign with no organomegaly or masses noted. Motor sensory and DTR levels are equal and symmetric in the upper and lower extremities. Cranial nerves II through XII are grossly intact. Proprioception is intact. No peripheral adenopathy or edema is identified. No motor or sensory levels are noted. Crude visual fields are within normal  range.  RADIOLOGY RESULTS: Mammograms are reviewed and compatible with the above-stated findings  PLAN: At the present time patient is doing well with no evidence of disease in either breast now out 4 years. I've scheduled follow-up appointment in 1 year and then will discontinue follow-up care. She continues close follow-up care with surgeon and medical oncology. Patient is to call with any concerns. She continues on Femara now without side effect.  I would like to take this opportunity to thank you for allowing me to participate in the care of your patient.Noreene Filbert, MD

## 2017-09-12 ENCOUNTER — Encounter: Payer: Self-pay | Admitting: Hematology and Oncology

## 2017-09-12 ENCOUNTER — Inpatient Hospital Stay: Payer: Medicare Other | Admitting: Hematology and Oncology

## 2017-09-12 ENCOUNTER — Other Ambulatory Visit: Payer: Self-pay

## 2017-09-12 ENCOUNTER — Inpatient Hospital Stay: Payer: Medicare Other | Attending: Hematology and Oncology

## 2017-09-12 VITALS — BP 148/80 | HR 79 | Temp 98.3°F | Wt 176.0 lb

## 2017-09-12 DIAGNOSIS — Z87891 Personal history of nicotine dependence: Secondary | ICD-10-CM | POA: Diagnosis not present

## 2017-09-12 DIAGNOSIS — Z9221 Personal history of antineoplastic chemotherapy: Secondary | ICD-10-CM | POA: Insufficient documentation

## 2017-09-12 DIAGNOSIS — K921 Melena: Secondary | ICD-10-CM

## 2017-09-12 DIAGNOSIS — Z79899 Other long term (current) drug therapy: Secondary | ICD-10-CM

## 2017-09-12 DIAGNOSIS — E119 Type 2 diabetes mellitus without complications: Secondary | ICD-10-CM | POA: Insufficient documentation

## 2017-09-12 DIAGNOSIS — Z886 Allergy status to analgesic agent status: Secondary | ICD-10-CM

## 2017-09-12 DIAGNOSIS — C50911 Malignant neoplasm of unspecified site of right female breast: Secondary | ICD-10-CM | POA: Insufficient documentation

## 2017-09-12 DIAGNOSIS — C50912 Malignant neoplasm of unspecified site of left female breast: Secondary | ICD-10-CM | POA: Diagnosis not present

## 2017-09-12 DIAGNOSIS — Z880 Allergy status to penicillin: Secondary | ICD-10-CM | POA: Diagnosis not present

## 2017-09-12 DIAGNOSIS — M549 Dorsalgia, unspecified: Secondary | ICD-10-CM

## 2017-09-12 DIAGNOSIS — C50312 Malignant neoplasm of lower-inner quadrant of left female breast: Secondary | ICD-10-CM

## 2017-09-12 DIAGNOSIS — Z170 Estrogen receptor positive status [ER+]: Secondary | ICD-10-CM

## 2017-09-12 DIAGNOSIS — Z794 Long term (current) use of insulin: Secondary | ICD-10-CM

## 2017-09-12 DIAGNOSIS — G8929 Other chronic pain: Secondary | ICD-10-CM | POA: Diagnosis not present

## 2017-09-12 DIAGNOSIS — G629 Polyneuropathy, unspecified: Secondary | ICD-10-CM | POA: Insufficient documentation

## 2017-09-12 DIAGNOSIS — Z853 Personal history of malignant neoplasm of breast: Secondary | ICD-10-CM

## 2017-09-12 DIAGNOSIS — R232 Flushing: Secondary | ICD-10-CM

## 2017-09-12 DIAGNOSIS — Z923 Personal history of irradiation: Secondary | ICD-10-CM

## 2017-09-12 DIAGNOSIS — Z79811 Long term (current) use of aromatase inhibitors: Secondary | ICD-10-CM

## 2017-09-12 DIAGNOSIS — I10 Essential (primary) hypertension: Secondary | ICD-10-CM

## 2017-09-12 DIAGNOSIS — Z7189 Other specified counseling: Secondary | ICD-10-CM | POA: Insufficient documentation

## 2017-09-12 DIAGNOSIS — Z7982 Long term (current) use of aspirin: Secondary | ICD-10-CM | POA: Diagnosis not present

## 2017-09-12 DIAGNOSIS — D649 Anemia, unspecified: Secondary | ICD-10-CM | POA: Insufficient documentation

## 2017-09-12 DIAGNOSIS — K219 Gastro-esophageal reflux disease without esophagitis: Secondary | ICD-10-CM | POA: Diagnosis not present

## 2017-09-12 DIAGNOSIS — Z13820 Encounter for screening for osteoporosis: Secondary | ICD-10-CM

## 2017-09-12 LAB — IRON AND TIBC
Iron: 40 ug/dL (ref 28–170)
Saturation Ratios: 15 % (ref 10.4–31.8)
TIBC: 275 ug/dL (ref 250–450)
UIBC: 235 ug/dL

## 2017-09-12 LAB — FERRITIN: Ferritin: 130 ng/mL (ref 11–307)

## 2017-09-12 LAB — CBC WITH DIFFERENTIAL/PLATELET
Basophils Absolute: 0.1 10*3/uL (ref 0–0.1)
Basophils Relative: 1 %
Eosinophils Absolute: 0.8 10*3/uL — ABNORMAL HIGH (ref 0–0.7)
Eosinophils Relative: 9 %
HCT: 32.7 % — ABNORMAL LOW (ref 35.0–47.0)
Hemoglobin: 11.1 g/dL — ABNORMAL LOW (ref 12.0–16.0)
Lymphocytes Relative: 14 %
Lymphs Abs: 1.3 10*3/uL (ref 1.0–3.6)
MCH: 29.9 pg (ref 26.0–34.0)
MCHC: 34.1 g/dL (ref 32.0–36.0)
MCV: 87.7 fL (ref 80.0–100.0)
Monocytes Absolute: 0.5 10*3/uL (ref 0.2–0.9)
Monocytes Relative: 6 %
Neutro Abs: 6.6 10*3/uL — ABNORMAL HIGH (ref 1.4–6.5)
Neutrophils Relative %: 70 %
Platelets: 196 10*3/uL (ref 150–440)
RBC: 3.72 MIL/uL — ABNORMAL LOW (ref 3.80–5.20)
RDW: 13.8 % (ref 11.5–14.5)
WBC: 9.3 10*3/uL (ref 3.6–11.0)

## 2017-09-12 LAB — COMPREHENSIVE METABOLIC PANEL
ALT: 14 U/L (ref 14–54)
AST: 15 U/L (ref 15–41)
Albumin: 3.9 g/dL (ref 3.5–5.0)
Alkaline Phosphatase: 87 U/L (ref 38–126)
Anion gap: 8 (ref 5–15)
BUN: 16 mg/dL (ref 6–20)
CO2: 26 mmol/L (ref 22–32)
Calcium: 8.8 mg/dL — ABNORMAL LOW (ref 8.9–10.3)
Chloride: 102 mmol/L (ref 101–111)
Creatinine, Ser: 1.14 mg/dL — ABNORMAL HIGH (ref 0.44–1.00)
GFR calc Af Amer: 53 mL/min — ABNORMAL LOW (ref >60)
GFR calc non Af Amer: 46 mL/min — ABNORMAL LOW (ref >60)
Glucose, Bld: 158 mg/dL — ABNORMAL HIGH (ref 65–99)
Potassium: 4.5 mmol/L (ref 3.5–5.1)
Sodium: 136 mmol/L (ref 135–145)
Total Bilirubin: 0.6 mg/dL (ref 0.3–1.2)
Total Protein: 6.2 g/dL — ABNORMAL LOW (ref 6.5–8.1)

## 2017-09-12 LAB — FOLATE: FOLATE: 13.6 ng/mL (ref >5.9)

## 2017-09-12 LAB — RETICULOCYTES
RBC.: 3.86 MIL/uL (ref 3.80–5.20)
RETIC COUNT ABSOLUTE: 73.3 10*3/uL (ref 19.0–183.0)
Retic Ct Pct: 1.9 % (ref 0.4–3.1)

## 2017-09-12 NOTE — Progress Notes (Addendum)
[facility] [facility] day:  09/12/2017  Chief Complaint: Kelsey Mason is a 77 y.o. female with bilateral stage IA breast cancer who is seen for 6 month assessment on Femara.  HPI: The patient was last seen in the medical oncology clinic on 03/22/2017.  At that time, she felt pretty good.  Exam was stable  Bilateral diagnostic mammogram on 03/31/2017 revealed no evidence of malignancy.  She did not have her bone density study.  During the interim, patient is doing well. She has no acute concerns. Patient complains of chronic back pain. She has intermittent neuropathy in her hands. Patient have infrequent hot flashes. She denies any breast concerns. She denies B symptoms and recent infections.   She is eating well. Patient's weight is up 2 pounds. She denies pain in the clinic today.  She performs monthly self breast examinations as recommended. Patient continues on her Femara as prescribed with no perceived side effects.   She denies any hematochezia, hematuria or vaginal bleeding.  She describes one episode of black stool.  She east meat 1-2x/week.  Last colonoscopy was on 10/13/2015 by Dr. Vira Agar.  There was one small polyp in the hepatic flexure, a diminutive polyp in the recto-sigmoid, and diverticulosis.   Past Medical History:  Diagnosis Date  . Breast symptom 2014  . Cancer [facility]) 2014   Bilateral Breast, chemo Dr. Jeb Levering  . Diabetes mellitus   . GERD (gastroesophageal reflux disease)    mild  . Hypertension   . Personal history of chemotherapy   . Personal history of radiation therapy     Past Surgical History:  Procedure Laterality Date  . ABDOMINAL HYSTERECTOMY  1976   for pelvic pain  . BREAST EXCISIONAL BIOPSY Bilateral 11/2012   bilat breast ca chemo and rad  . BREAST SURGERY Bilateral 2014   bilateral lumpectomy and SN biopsy  . CHOLECYSTECTOMY  2007  . COLONOSCOPY WITH PROPOFOL N/A 10/13/2015   Procedure: COLONOSCOPY  WITH PROPOFOL;  Surgeon: Manya Silvas, MD;  Location: [facility] END[facility]COPY;  Service: Endoscopy;  Laterality: N/A;  . OOPHORECTOMY Right   . PORTACATH PLACEMENT  2014  . TUMOR REMOVAL Right 1980   right foot    Family History  Problem Relation Age of Onset  . Alcohol abuse Mother   . Heart disease Mother   . Hypertension Mother   . Breast cancer Mother 51  . Alcohol abuse Father   . Heart disease Father   . Hypertension Father   . Heart disease Brother     Social History:  reports that she quit smoking about 56 years ago. Her smoking use included cigarettes. She has a 0.25 pack-year smoking history. she has never used smokeless tobacco. She reports that she does not drink alcohol or use drugs.  She likes to do line dancing.  She lives in [place].  The patient is alone today.  Allergies:  Allergies  Allergen Reactions  . Advil [Ibuprofen] Other (See Comments)    Dizziness   . Aleve [Naproxen] Other (See Comments)    Dizziness  . Fluticasone-Salmeterol Other (See Comments)    dizziness  . Lipitor [Atorvastatin] Other (See Comments)    Legs weak.  Leg pain, muscle ache  . Naproxen Sodium Other (See Comments)  . Penicillins Hives and Other (See Comments)    Current Medications: Current Outpatient Medications  Medication Sig Dispense Refill  . aspirin 81 MG tablet Take 81 mg by mouth daily.    [name]  blood glucose meter kit and supplies KIT Dispense based on patient and insurance preference. Use up to four times daily as directed. One Touch Ultra mini meter (FOR ICD-9 250.00, 250.01). Dx: E11.9 1 each 0  . carvedilol (COREG) 12.5 MG tablet Take 1 tablet (12.5 mg total) by mouth 2 (two) times daily. 180 tablet 3  . furosemide (LASIX) 20 MG tablet Take 20 mg by mouth daily as needed.  3  . glimepiride (AMARYL) 2 MG tablet TAKE 1 TABLET (2 MG TOTAL) BY MOUTH 2 (TWO) TIMES DAILY. 180 tablet 1  . glucose blood (ONE TOUCH ULTRA TEST) test strip USE TO TEST BLOOD SUGAR UP TO 4 TIMES  DAILY 100 each 6  . LANTUS SOLOSTAR 100 UNIT/ML Solostar Pen INJECT 20 UNITS INTO THE SKIN DAILY AT 10 PM. (Patient taking differently: Inject 46 Units into the skin daily at 10 pm. ) 18 pen 0  . letrozole (FEMARA) 2.5 MG tablet TAKE 1 TABLET BY MOUTH EVERY DAY 90 tablet 2  . losartan (COZAAR) 50 MG tablet Take 2 tablets (100 mg total) by mouth daily. 90 tablet 3  . NOVOFINE 32G X 6 MM MISC USE AS DIRECTED WITH LANTUS PEN 100 each 2  . rosuvastatin (CRESTOR) 5 MG tablet Take 1 tablet (5 mg total) by mouth daily. 90 tablet 3   No current facility-administered medications for this visit.     Review of Systems:  GENERAL:  Feels "alright".  No fevers or sweats.  Weight up 2 pounds.  PERFORMANCE STATUS (ECOG):  1 HEENT: No visual changes, runny nose, sore throat, mouth sores or tenderness. Lungs: No shortness of breath.  No cough.  No hemoptysis. Cardiac:  No chest pain, palpitations, or orthopnea. GI:  Chronic constipation. No nausea, vomiting, diarrhea, melena or hematochezia. GU:  No urgency, frequency, dysuria, or hematuria. Musculoskeletal:  Back pain with standing.  No joint pain.  No muscle tenderness. Extremities:  No pain or swelling. Skin:  No rashes or skin changes. Neuro:  Rare neuropathic symptoms.  No headache, numbness or weakness, balance or coordination issues. Endocrine:  No diabetes, thyroid issues, hot flashes or night sweats. Psych:  No mood changes, depression or anxiety. Pain:  No focal pain. Review of systems:  All other systems reviewed and found to be negative.  Physical Exam: Blood pressure (!) 148/80, pulse 79, temperature 98.3 F (36.8 C), temperature source Tympanic, weight 176 lb (79.8 kg). GENERAL:  Well developed, well nourished, woman sitting comfortably in the exam room in no acute distress. MENTAL STATUS:  Alert and oriented to person, place and time. HEAD:  Wearing a black cap.  Normocephalic, atraumatic, face symmetric, no Cushingoid features. EYES:   Blue eyes.  Pupils equal round and reactive to light and accomodation.  No conjunctivitis or scleral icterus. ENT:  Oropharynx clear without lesion.  Tongue normal. Mucous membranes moist.  RESPIRATORY:  Clear to auscultation without rales, wheezes or rhonchi. CARDIOVASCULAR:  Regular rate and rhythm without murmur, rub or gallop. ABDOMEN:  Soft, non-tender, with active bowel sounds, and no hepatosplenomegaly.  No masses. BREAST:  Right breast with upper outer semicircular incision.  Fibrocystic changes laterally.  No masses, skin changes or nipple discharge.  Left breast with medial longitudinal incision.  Scarring at 9 o'clock and edema inferiorly.  No masses, skin changes or nipple discharge.  SKIN:  No rashes, ulcers or lesions. EXTREMITIES: No edema, no skin discoloration or tenderness.  No palpable cords. LYMPH NODES: No other palpable cervical, supraclavicular, axillary or  inguinal adenopathy  NEUROLOGICAL: Unremarkable. PSYCH:  Appropriate.   Appointment on 09/12/2017  Component Date Value Ref Range Status  . Sodium 09/12/2017 136  135 - 145 mmol/L Final  . Potassium 09/12/2017 4.5  3.5 - 5.1 mmol/L Final  . Chloride 09/12/2017 102  101 - 111 mmol/L Final  . CO2 09/12/2017 26  22 - 32 mmol/L Final  . Glucose, Bld 09/12/2017 158* 65 - 99 mg/dL Final  . BUN 09/12/2017 16  6 - 20 mg/dL Final  . Creatinine, Ser 09/12/2017 1.14* 0.44 - 1.00 mg/dL Final  . Calcium 09/12/2017 8.8* 8.9 - 10.3 mg/dL Final  . Total Protein 09/12/2017 6.2* 6.5 - 8.1 g/dL Final  . Albumin 09/12/2017 3.9  3.5 - 5.0 g/dL Final  . AST 09/12/2017 15  15 - 41 U/L Final  . ALT 09/12/2017 14  14 - 54 U/L Final  . Alkaline Phosphatase 09/12/2017 87  38 - 126 U/L Final  . Total Bilirubin 09/12/2017 0.6  0.3 - 1.2 mg/dL Final  . GFR calc non Af Amer 09/12/2017 46* >60 mL/min Final  . GFR calc Af Amer 09/12/2017 53* >60 mL/min Final   Comment: (NOTE) The eGFR has been calculated using the CKD EPI equation. This  calculation has not been validated in all clinical situations. eGFR's persistently <60 mL/min signify possible Chronic Kidney Disease.   Georgiann Hahn gap 09/12/2017 8  5 - 15 Final   Performed at [facility], [address] [place], [place] [address]  . WBC 09/12/2017 9.3  3.6 - 11.0 K/uL Final  . RBC 09/12/2017 3.72* 3.80 - 5.20 MIL/uL Final  . Hemoglobin 09/12/2017 11.1* 12.0 - 16.0 g/dL Final  . HCT 09/12/2017 32.7* 35.0 - 47.0 % Final  . MCV 09/12/2017 87.7  80.0 - 100.0 fL Final  . MCH 09/12/2017 29.9  26.0 - 34.0 pg Final  . MCHC 09/12/2017 34.1  32.0 - 36.0 g/dL Final  . RDW 09/12/2017 13.8  11.5 - 14.5 % Final  . Platelets 09/12/2017 196  150 - 440 K/uL Final  . Neutrophils Relative % 09/12/2017 70  % Final  . Neutro Abs 09/12/2017 6.6* 1.4 - 6.5 K/uL Final  . Lymphocytes Relative 09/12/2017 14  % Final  . Lymphs Abs 09/12/2017 1.3  1.0 - 3.6 K/uL Final  . Monocytes Relative 09/12/2017 6  % Final  . Monocytes Absolute 09/12/2017 0.5  0.2 - 0.9 K/uL Final  . Eosinophils Relative 09/12/2017 9  % Final  . Eosinophils Absolute 09/12/2017 0.8* 0 - 0.7 K/uL Final  . Basophils Relative 09/12/2017 1  % Final  . Basophils Absolute 09/12/2017 0.1  0 - 0.1 K/uL Final   Performed at [facility], [address] [place], [place] [address]    Assessment:  Kelsey Mason is a 77 y.o. female with bilateral breast cancer s/p lumpectomy and sentinel lymph node biopsy on 11/17/2012.  Right breast revealed a 5 mm grade I invasive carcinoma.  There was no DCIS.  Two sentinel lymph nodes were negative.  Pathologic stage was T1aN0M0.  Tumor was ER + (90%), PR + (20%), and Her2/neu -.   Left breast revealed a 1.8 cm grade III invasive ductal carcinoma.  There was lymphovascular invasion.  There was no DCIS.  One sentinel lymph node was negative.  Pathologic stage was T1cN0M0.  Tumor was ER + (>90%), PR + (1-5%), and Her2/neu -.  Oncotype DX testing on the left breast mass  revealed a recurrence score of  28 which corresponded to a 10 year risk of distant recurrence of 19% (CI 15-23%) with tamoxifen alone.  She received Adriamycin and Cytoxan (AC) x 4 cycles followed by weekly Taxol x 10 (completed 06/05/2013).  She did not receive 2 cycles of Taxol secondary to progressive neuropathy.  She received bilateral breast radiation (07/2013 - 09/2013).  She began letrozole (Femara) in 09/2013.  Bilateral diagnostic mammogram on 03/22/2016 revealed  no evidence of recurrent disease.  Bilateral diagnostic mammogram on 03/31/2017 revealed no evidence of malignancy.  CA27.29 has been followed:  24.4 on 09/10/2016, 28.1 on 03/14/2017, and 24.9 on 09/12/2017.  Bone density study on 03/21/2015 was normal with a T score of -0.5 in L1-L4 and -0.8 in the left femur.  She has a history of a borderline mild normocytic anemia.  Diet appears modest.  She denies any melena, hematochezia, hematuria or vaginal bleeding. Colonoscopy on 10/13/2015 was negative.    Soft tissue neck ultrasound on 03/16/2017 revealed no fluid collection or soft tissue lesion.  She has a new mild normocytic anemia.  She denies any hematochezia, hematuria or vaginal bleeding.  She describes one episode of black stool.  Diet is modest.  Colonoscopy on 10/13/2015 revealed one small polyp in the hepatic flexure, a diminutive polyp in the recto-sigmoid, and diverticulosis.  Symptomatically, she is feeling pretty good.  She denies acute concerns. Exam is stable.  She has new anemia.  Plan: 1.  Labs today:  CBC with diff, CMP, CA27.29, ferritin, iron studies, retic, folate 2.  Review interval mammogram- no evidence of disease. 3.  Reschedule bone density study. Last done in 2016. 4.  Continue Femara as previously prescribed.  5.  Schedule mammogram 03/31/2018. 6.  Discuss BCI testing to assess the benefit of extended (5 versus 10 years) adjuvant hormonal therapy. 7.  Discuss anemia and preliminary work-up.   Unclear significance of isolated black stool.  Follow-up CBC with Dr. Caryl Bis. 8.  RTC in 6 months for MD assessment and labs (CBC with diff, CMP, CA27.29, B12).   Honor Loh, NP  09/12/2017, 3:33 PM   I saw and evaluated the patient, participating in the key portions of the service and reviewing pertinent diagnostic studies and records.  I reviewed the nurse practitioner's note and agree with the findings and the plan.  The assessment and plan were discussed with the patient.  A few questions were asked by the patient and answered.   Lequita Asal, MD 09/12/2017, 3:33 PM

## 2017-09-13 LAB — CA 27.29 (SERIAL MONITOR): CA 27.29: 24.9 U/mL (ref 0.0–38.6)

## 2017-09-15 ENCOUNTER — Telehealth: Payer: Self-pay | Admitting: Family Medicine

## 2017-09-15 ENCOUNTER — Telehealth: Payer: Self-pay | Admitting: *Deleted

## 2017-09-15 DIAGNOSIS — D649 Anemia, unspecified: Secondary | ICD-10-CM

## 2017-09-15 NOTE — Telephone Encounter (Signed)
-----   Message from Karen Kitchens, NP sent at 09/15/2017 11:49 AM EDT ----- She was given the information the other day. I am assuming that she is calling to advise whether or not she is going to proceed.   Gaspar Bidding  ----- Message ----- From: Shirlean Kelly, RN Sent: 09/15/2017  11:43 AM To: Karen Kitchens, NP  Did we actually do the test or just give her information about it? ----- Message ----- From: Karen Kitchens, NP Sent: 09/15/2017  11:10 AM To: Shirlean Kelly, RN  Please follow up with her regarding BCI testing.   Gaspar Bidding  ----- Message ----- From: Manus Rudd, RN Sent: 09/15/2017  11:02 AM To: Karen Kitchens, NP  Patient would like you to call regarding a test that has been ordered. Thanks

## 2017-09-15 NOTE — Telephone Encounter (Signed)
Please let the patient know that I got a message from her oncologist regarding lab work that revealed a slight anemia.  Please see if we can get her in in the next 1-2 weeks to recheck her lab work.  Order has been placed.  If she has further dark stools or blood in her stool she needs to be evaluated.  Thanks.

## 2017-09-15 NOTE — Telephone Encounter (Signed)
Patient notified and scheduled 

## 2017-09-15 NOTE — Telephone Encounter (Signed)
-----   Message from Lequita Asal, MD sent at 09/14/2017 12:46 AM EDT ----- Regarding: Patient has a new mild normocytic anemia  Randall Hiss,  Preliminary labs sent.  She described one episode of black stool.  Note forwarded.  I suggested follow-up CBC with you.  Melissa

## 2017-09-15 NOTE — Telephone Encounter (Signed)
Patient called to inquire of side effects of Femara.  Reviewed side effects with her.  She also called her insurance company regarding BCI testing.  She was told her copay would be $100.  She would like to proceed with testing.

## 2017-09-19 NOTE — Telephone Encounter (Signed)
I have completed the BCI testing requisition form. I will plan on sending it today (09/19/2017).

## 2017-09-21 ENCOUNTER — Ambulatory Visit: Payer: Medicare Other | Admitting: Family Medicine

## 2017-09-22 ENCOUNTER — Other Ambulatory Visit (INDEPENDENT_AMBULATORY_CARE_PROVIDER_SITE_OTHER): Payer: Medicare Other

## 2017-09-22 DIAGNOSIS — D649 Anemia, unspecified: Secondary | ICD-10-CM | POA: Diagnosis not present

## 2017-09-22 LAB — CBC
HCT: 34.5 % — ABNORMAL LOW (ref 36.0–46.0)
Hemoglobin: 11.8 g/dL — ABNORMAL LOW (ref 12.0–15.0)
MCHC: 34.1 g/dL (ref 30.0–36.0)
MCV: 87.6 fl (ref 78.0–100.0)
PLATELETS: 224 10*3/uL (ref 150.0–400.0)
RBC: 3.94 Mil/uL (ref 3.87–5.11)
RDW: 13.9 % (ref 11.5–15.5)
WBC: 11.8 10*3/uL — ABNORMAL HIGH (ref 4.0–10.5)

## 2017-10-10 ENCOUNTER — Other Ambulatory Visit: Payer: Self-pay | Admitting: Internal Medicine

## 2017-10-18 ENCOUNTER — Ambulatory Visit
Admission: RE | Admit: 2017-10-18 | Discharge: 2017-10-18 | Disposition: A | Discharge status: Home / Self Care | Payer: Medicare Other | Source: Ambulatory Visit | Attending: Urgent Care | Admitting: Urgent Care

## 2017-10-18 DIAGNOSIS — Z170 Estrogen receptor positive status [ER+]: Secondary | ICD-10-CM | POA: Insufficient documentation

## 2017-10-18 DIAGNOSIS — M85852 Other specified disorders of bone density and structure, left thigh: Secondary | ICD-10-CM | POA: Diagnosis not present

## 2017-10-18 DIAGNOSIS — C50312 Malignant neoplasm of lower-inner quadrant of left female breast: Secondary | ICD-10-CM | POA: Insufficient documentation

## 2017-10-18 DIAGNOSIS — Z13820 Encounter for screening for osteoporosis: Secondary | ICD-10-CM | POA: Insufficient documentation

## 2017-10-24 ENCOUNTER — Encounter: Payer: Self-pay | Admitting: Family Medicine

## 2017-10-24 ENCOUNTER — Ambulatory Visit: Payer: Medicare Other | Admitting: Family Medicine

## 2017-10-24 ENCOUNTER — Other Ambulatory Visit: Payer: Self-pay

## 2017-10-24 VITALS — BP 150/86 | HR 78 | Temp 98.3°F | Wt 176.0 lb

## 2017-10-24 DIAGNOSIS — I10 Essential (primary) hypertension: Secondary | ICD-10-CM | POA: Diagnosis not present

## 2017-10-24 DIAGNOSIS — R1013 Epigastric pain: Secondary | ICD-10-CM

## 2017-10-24 DIAGNOSIS — M25562 Pain in left knee: Secondary | ICD-10-CM | POA: Diagnosis not present

## 2017-10-24 DIAGNOSIS — R229 Localized swelling, mass and lump, unspecified: Secondary | ICD-10-CM

## 2017-10-24 LAB — CBC WITH DIFFERENTIAL/PLATELET
BASOS PCT: 1 % (ref 0.0–3.0)
Basophils Absolute: 0.1 10*3/uL (ref 0.0–0.1)
Eosinophils Absolute: 1 10*3/uL — ABNORMAL HIGH (ref 0.0–0.7)
Eosinophils Relative: 9 % — ABNORMAL HIGH (ref 0.0–5.0)
HEMATOCRIT: 35.4 % — AB (ref 36.0–46.0)
HEMOGLOBIN: 12 g/dL (ref 12.0–15.0)
LYMPHS PCT: 13.3 % (ref 12.0–46.0)
Lymphs Abs: 1.5 10*3/uL (ref 0.7–4.0)
MCHC: 33.9 g/dL (ref 30.0–36.0)
MCV: 86.9 fl (ref 78.0–100.0)
MONO ABS: 0.6 10*3/uL (ref 0.1–1.0)
Monocytes Relative: 5.4 % (ref 3.0–12.0)
Neutro Abs: 7.9 10*3/uL — ABNORMAL HIGH (ref 1.4–7.7)
Neutrophils Relative %: 71.3 % (ref 43.0–77.0)
Platelets: 213 10*3/uL (ref 150.0–400.0)
RBC: 4.08 Mil/uL (ref 3.87–5.11)
RDW: 14.1 % (ref 11.5–15.5)
WBC: 11.1 10*3/uL — AB (ref 4.0–10.5)

## 2017-10-24 LAB — COMPREHENSIVE METABOLIC PANEL
ALT: 13 U/L (ref 0–35)
AST: 13 U/L (ref 0–37)
Albumin: 4.2 g/dL (ref 3.5–5.2)
Alkaline Phosphatase: 71 U/L (ref 39–117)
BUN: 14 mg/dL (ref 6–23)
CALCIUM: 9.3 mg/dL (ref 8.4–10.5)
CO2: 27 mEq/L (ref 19–32)
CREATININE: 1.17 mg/dL (ref 0.40–1.20)
Chloride: 100 mEq/L (ref 96–112)
GFR: 47.72 mL/min — ABNORMAL LOW (ref >60.00)
Glucose, Bld: 132 mg/dL — ABNORMAL HIGH (ref 70–99)
Potassium: 4.3 mEq/L (ref 3.5–5.1)
SODIUM: 136 meq/L (ref 135–145)
Total Bilirubin: 0.5 mg/dL (ref 0.2–1.2)
Total Protein: 6.3 g/dL (ref 6.0–8.3)

## 2017-10-24 LAB — LIPASE: Lipase: 9 U/L — ABNORMAL LOW (ref 11.0–59.0)

## 2017-10-24 MED ORDER — OMEPRAZOLE 20 MG PO CPDR: 20.0000 mg | DELAYED_RELEASE_CAPSULE | Freq: Every day | ORAL | 0 refills | Status: DC

## 2017-10-24 NOTE — Patient Instructions (Addendum)
Nice to see you. Please do the exercises for your knee.  If it is not improving please let us know. We will check lab work for your stomach and start you on omeprazole.  Please try to take this 30 minutes before you eat breakfast. If you have worsening stomach symptoms please be reevaluated.   Knee Exercises Ask your health care provider which exercises are safe for you. Do exercises exactly as told by your health care provider and adjust them as directed. It is normal to feel mild stretching, pulling, tightness, or discomfort as you do these exercises, but you should stop right away if you feel sudden pain or your pain gets worse.Do not begin these exercises until told by your health care provider. STRETCHING AND RANGE OF MOTION EXERCISES These exercises warm up your muscles and joints and improve the movement and flexibility of your knee. These exercises also help to relieve pain, numbness, and tingling. Exercise A: Knee Extension, Prone 1. Lie on your abdomen on a bed. 2. Place your left / right knee just beyond the edge of the surface so your knee is not on the bed. You can put a towel under your left / right thigh just above your knee for comfort. 3. Relax your leg muscles and allow gravity to straighten your knee. You should feel a stretch behind your left / right knee. 4. Hold this position for __________ seconds. 5. Scoot up so your knee is supported between repetitions. Repeat __________ times. Complete this stretch __________ times a day. Exercise B: Knee Flexion, Active  1. Lie on your back with both knees straight. If this causes back discomfort, bend your left / right knee so your foot is flat on the floor. 2. Slowly slide your left / right heel back toward your buttocks until you feel a gentle stretch in the front of your knee or thigh. 3. Hold this position for __________ seconds. 4. Slowly slide your left / right heel back to the starting position. Repeat __________ times.  Complete this exercise __________ times a day. Exercise C: Quadriceps, Prone  1. Lie on your abdomen on a firm surface, such as a bed or padded floor. 2. Bend your left / right knee and hold your ankle. If you cannot reach your ankle or pant leg, loop a belt around your foot and grab the belt instead. 3. Gently pull your heel toward your buttocks. Your knee should not slide out to the side. You should feel a stretch in the front of your thigh and knee. 4. Hold this position for __________ seconds. Repeat __________ times. Complete this stretch __________ times a day. Exercise D: Hamstring, Supine 1. Lie on your back. 2. Loop a belt or towel over the ball of your left / right foot. The ball of your foot is on the walking surface, right under your toes. 3. Straighten your left / right knee and slowly pull on the belt to raise your leg until you feel a gentle stretch behind your knee. ? Do not let your left / right knee bend while you do this. ? Keep your other leg flat on the floor. 4. Hold this position for __________ seconds. Repeat __________ times. Complete this stretch __________ times a day. STRENGTHENING EXERCISES These exercises build strength and endurance in your knee. Endurance is the ability to use your muscles for a long time, even after they get tired. Exercise E: Quadriceps, Isometric  1. Lie on your back with your left / right  leg extended and your other knee bent. Put a rolled towel or small pillow under your knee if told by your health care provider. 2. Slowly tense the muscles in the front of your left / right thigh. You should see your kneecap slide up toward your hip or see increased dimpling just above the knee. This motion will push the back of the knee toward the floor. 3. For __________ seconds, keep the muscle as tight as you can without increasing your pain. 4. Relax the muscles slowly and completely. Repeat __________ times. Complete this exercise __________ times a  day. Exercise F: Straight Leg Raises - Quadriceps 1. Lie on your back with your left / right leg extended and your other knee bent. 2. Tense the muscles in the front of your left / right thigh. You should see your kneecap slide up or see increased dimpling just above the knee. Your thigh may even shake a bit. 3. Keep these muscles tight as you raise your leg 4-6 inches (10-15 cm) off the floor. Do not let your knee bend. 4. Hold this position for __________ seconds. 5. Keep these muscles tense as you lower your leg. 6. Relax your muscles slowly and completely after each repetition. Repeat __________ times. Complete this exercise __________ times a day. Exercise G: Hamstring, Isometric 1. Lie on your back on a firm surface. 2. Bend your left / right knee approximately __________ degrees. 3. Dig your left / right heel into the surface as if you are trying to pull it toward your buttocks. Tighten the muscles in the back of your thighs to dig as hard as you can without increasing any pain. 4. Hold this position for __________ seconds. 5. Release the tension gradually and allow your muscles to relax completely for __________ seconds after each repetition. Repeat __________ times. Complete this exercise __________ times a day. Exercise H: Hamstring Curls  If told by your health care provider, do this exercise while wearing ankle weights. Begin with __________ weights. Then increase the weight by 1 lb (0.5 kg) increments. Do not wear ankle weights that are more than __________. 1. Lie on your abdomen with your legs straight. 2. Bend your left / right knee as far as you can without feeling pain. Keep your hips flat against the floor. 3. Hold this position for __________ seconds. 4. Slowly lower your leg to the starting position.  Repeat __________ times. Complete this exercise __________ times a day. Exercise I: Squats (Quadriceps) 1. Stand in front of a table, with your feet and knees pointing  straight ahead. You may rest your hands on the table for balance but not for support. 2. Slowly bend your knees and lower your hips like you are going to sit in a chair. ? Keep your weight over your heels, not over your toes. ? Keep your lower legs upright so they are parallel with the table legs. ? Do not let your hips go lower than your knees. ? Do not bend lower than told by your health care provider. ? If your knee pain increases, do not bend as low. 3. Hold the squat position for __________ seconds. 4. Slowly push with your legs to return to standing. Do not use your hands to pull yourself to standing. Repeat __________ times. Complete this exercise __________ times a day. Exercise J: Wall Slides (Quadriceps)  1. Lean your back against a smooth wall or door while you walk your feet out 18-24 inches (46-61 cm) from it. 2. Place  your feet hip-width apart. 3. Slowly slide down the wall or door until your knees bend __________ degrees. Keep your knees over your heels, not over your toes. Keep your knees in line with your hips. 4. Hold for __________ seconds. Repeat __________ times. Complete this exercise __________ times a day. Exercise K: Straight Leg Raises - Hip Abductors 1. Lie on your side with your left / right leg in the top position. Lie so your head, shoulder, knee, and hip line up. You may bend your bottom knee to help you keep your balance. 2. Roll your hips slightly forward so your hips are stacked directly over each other and your left / right knee is facing forward. 3. Leading with your heel, lift your top leg 4-6 inches (10-15 cm). You should feel the muscles in your outer hip lifting. ? Do not let your foot drift forward. ? Do not let your knee roll toward the ceiling. 4. Hold this position for __________ seconds. 5. Slowly return your leg to the starting position. 6. Let your muscles relax completely after each repetition. Repeat __________ times. Complete this exercise  __________ times a day. Exercise L: Straight Leg Raises - Hip Extensors 1. Lie on your abdomen on a firm surface. You can put a pillow under your hips if that is more comfortable. 2. Tense the muscles in your buttocks and lift your left / right leg about 4-6 inches (10-15 cm). Keep your knee straight as you lift your leg. 3. Hold this position for __________ seconds. 4. Slowly lower your leg to the starting position. 5. Let your leg relax completely after each repetition. Repeat __________ times. Complete this exercise __________ times a day. This information is not intended to replace advice given to you by your health care provider. Make sure you discuss any questions you have with your health care provider. Document Released: 05/05/2005 Document Revised: 03/15/2016 Document Reviewed: 04/27/2015 Elsevier Interactive Patient Education  2018 [employer].

## 2017-10-24 NOTE — Progress Notes (Signed)
[name], MD Phone: [phone]  Kelsey Mason is a 77 y.o. female who presents today for f/u.  HYPERTENSION  Disease Monitoring  Home BP Monitoring [id number] systolic Chest pain- no    Dyspnea- no Medications  Compliance-  Taking losartan, lasix, coreg.  Edema- no  Left knee pain: Patient notes starting about 3 weeks ago her left knee started to bother her along the medial joint line.  Started after she was dancing.  No specific injury.  Feels like there is a knot there.  Toothache sensation in her knee all the time.  Does not give out on her.  Does not lock up.  Rare popping.  Tylenol is not terribly beneficial.  Heating pad made it worse.  She notes for the last year she has had some epigastric discomfort.  Feels like she gets bloated after eating at times.  Notes it will get tender and tight at times.  She has a sore spot in her left upper quadrant as well.  She has a bowel movement about every 4 days.  I one this morning.  Wear loose stools.  No nausea or vomiting.  No fevers.  No dysuria.  She is status post cholecystectomy and oophorectomy on the right.   Social History   Tobacco Use  Smoking Status Former Smoker  . Packs/day: 0.25  . Years: 1.00  . Pack years: 0.25  . Types: Cigarettes  . Last attempt to quit: 1963  . Years since quitting: 56.3  Smokeless Tobacco Never Used     ROS see history of present illness  Objective  Physical Exam Vitals:   10/24/17 1054  BP: (!) 150/86  Pulse: 78  Temp: 98.3 F (36.8 C)  SpO2: 98%    BP Readings from Last 3 Encounters:  10/24/17 (!) 150/86  09/12/17 (!) 148/80  09/07/17 (!) 156/70   Wt Readings from Last 3 Encounters:  10/24/17 176 lb (79.8 kg)  09/12/17 176 lb (79.8 kg)  09/07/17 176 lb 5.9 oz (80 kg)    Physical Exam  Constitutional: No distress.  Cardiovascular: Normal rate, regular rhythm and normal heart sounds.  Pulmonary/Chest: Effort normal and breath sounds normal.  Abdominal: Soft.  Bowel sounds are normal. She exhibits no distension. There is tenderness (Slight tenderness in the epigastric area). There is no rebound and no guarding.  Left upper quadrant subcutaneous nodule noted that is slightly tender  Musculoskeletal: She exhibits no edema.  Left knee with slight soft tissue prominence over the medial joint line, no apparent effusion, slightly tender in this area, no erythema or warmth, no ligamentous laxity, negative McMurray's, right knee with no swelling, warmth, tenderness, or erythema, no ligamentous laxity, negative McMurray's  Neurological: She is alert.  Skin: Skin is warm and dry. She is not diaphoretic.     Assessment/Plan: Please see individual problem list.  Left knee pain Possible meniscal issue versus osteoarthritis.  Discussed potential for imaging versus referral to sports medicine for possible ultrasound and injection.  She deferred these things and opted to monitor.  Offered physical therapy though she deferred that as well.  She will complete exercises at home.  If not improving she will let us know.  Hypertension Appears well-controlled at home.  She will continue to monitor at home and if it elevates further let us know.  Continue current regimen.  Epigastric abdominal pain Chronic issue.  Suspect gastritis or peptic ulcer disease.  Will check lab work to evaluate for other potential causes.  Placed on  Prilosec for 14 days.  Consider GI evaluation for EGD if this does not improve.  Subcutaneous nodule Noted in left upper abdomen.  Less than a centimeter in size.  Discussed ultrasound versus CT imaging.  Opted to defer at this time until lab work returns.   Orders Placed This Encounter  Procedures  . Comp Met (CMET)  . Lipase  . CBC w/Diff    Meds ordered this encounter  Medications  . omeprazole (PRILOSEC) 20 MG capsule    Sig: Take 1 capsule (20 mg total) by mouth daily.    Dispense:  14 capsule    Refill:  0     [name],  MD [place]

## 2017-10-26 DIAGNOSIS — R229 Localized swelling, mass and lump, unspecified: Secondary | ICD-10-CM | POA: Insufficient documentation

## 2017-10-26 DIAGNOSIS — M25562 Pain in left knee: Secondary | ICD-10-CM | POA: Insufficient documentation

## 2017-10-26 NOTE — Assessment & Plan Note (Signed)
Possible meniscal issue versus osteoarthritis.  Discussed potential for imaging versus referral to sports medicine for possible ultrasound and injection.  She deferred these things and opted to monitor.  Offered physical therapy though she deferred that as well.  She will complete exercises at home.  If not improving she will let us know.

## 2017-10-26 NOTE — Assessment & Plan Note (Signed)
Appears well-controlled at home.  She will continue to monitor at home and if it elevates further let us know.  Continue current regimen.

## 2017-10-26 NOTE — Assessment & Plan Note (Signed)
Noted in left upper abdomen.  Less than a centimeter in size.  Discussed ultrasound versus CT imaging.  Opted to defer at this time until lab work returns.

## 2017-10-26 NOTE — Assessment & Plan Note (Addendum)
Chronic issue.  Suspect gastritis or peptic ulcer disease.  Will check lab work to evaluate for other potential causes.  Placed on Prilosec for 14 days.  Consider GI evaluation for EGD if this does not improve.

## 2017-10-27 ENCOUNTER — Other Ambulatory Visit: Payer: Self-pay | Admitting: Family Medicine

## 2017-10-27 DIAGNOSIS — R229 Localized swelling, mass and lump, unspecified: Secondary | ICD-10-CM

## 2017-11-07 ENCOUNTER — Ambulatory Visit
Admission: RE | Admit: 2017-11-07 | Discharge: 2017-11-07 | Disposition: A | Discharge status: Home / Self Care | Payer: Medicare Other | Source: Ambulatory Visit | Attending: Family Medicine | Admitting: Family Medicine

## 2017-11-07 ENCOUNTER — Other Ambulatory Visit: Payer: Self-pay | Admitting: Family Medicine

## 2017-11-07 ENCOUNTER — Telehealth: Payer: Self-pay | Admitting: Family Medicine

## 2017-11-07 DIAGNOSIS — R229 Localized swelling, mass and lump, unspecified: Secondary | ICD-10-CM | POA: Insufficient documentation

## 2017-11-07 NOTE — Telephone Encounter (Signed)
Copied from [place]. Topic: Quick Communication - See Telephone Encounter >> Nov 07, 2017  2:58 PM Robina Ade, Helene Kelp D wrote: CRM for notification. See Telephone encounter for: 11/07/17. Almyra Free from Radiology called and wanted to let Dr. Caryl Bis that she changed the Ultrasound order to [facility] due to the patient showing where the pain was but they still looked at the places that provider wrote down. If he can please sign the new order and send back and if there are any questions to call her back [phone].

## 2017-11-07 NOTE — Telephone Encounter (Signed)
Please advise 

## 2017-11-07 NOTE — Telephone Encounter (Signed)
Order signed. I will await the result. Thanks.

## 2017-11-09 ENCOUNTER — Telehealth: Payer: Self-pay

## 2017-11-09 ENCOUNTER — Encounter: Payer: Self-pay | Admitting: General Surgery

## 2017-11-09 DIAGNOSIS — R229 Localized swelling, mass and lump, unspecified: Secondary | ICD-10-CM

## 2017-11-09 NOTE — Telephone Encounter (Signed)
Copied from [place] [id number]. Topic: General - Other >> Nov 09, 2017  9:43 AM Yvette Rack wrote: Reason for CRM: Pt called in requesting Dr. Caryl Bis nurse to go ahead and schedule an appt with the surgeon. Pt states she would like it scheduled with the surgeon on [place]. Pt states she is available any day except 11/28/17. Pt would like a call back once the appt is scheduled.

## 2017-11-09 NOTE — Telephone Encounter (Signed)
Please advise 

## 2017-11-09 NOTE — Addendum Note (Signed)
Addended by: Caryl Bis Lauryn Lizardi G on: 11/09/2017 12:41 PM   Modules accepted: Orders

## 2017-11-09 NOTE — Telephone Encounter (Signed)
Referral placed. I will forward to [facility] as well to make her aware.

## 2017-11-23 ENCOUNTER — Encounter: Payer: Self-pay | Admitting: Hematology and Oncology

## 2017-11-25 ENCOUNTER — Other Ambulatory Visit
Admission: RE | Admit: 2017-11-25 | Discharge: 2017-11-25 | Disposition: A | Discharge status: Home / Self Care | Payer: Medicare Other | Source: Ambulatory Visit | Attending: Internal Medicine | Admitting: Internal Medicine

## 2017-11-25 DIAGNOSIS — C50312 Malignant neoplasm of lower-inner quadrant of left female breast: Secondary | ICD-10-CM

## 2017-11-25 DIAGNOSIS — E785 Hyperlipidemia, unspecified: Secondary | ICD-10-CM | POA: Insufficient documentation

## 2017-11-25 DIAGNOSIS — Z170 Estrogen receptor positive status [ER+]: Secondary | ICD-10-CM

## 2017-11-25 DIAGNOSIS — Z79899 Other long term (current) drug therapy: Secondary | ICD-10-CM | POA: Insufficient documentation

## 2017-11-25 DIAGNOSIS — I10 Essential (primary) hypertension: Secondary | ICD-10-CM | POA: Insufficient documentation

## 2017-11-25 LAB — LIPID PANEL
Cholesterol: 134 mg/dL (ref 0–200)
HDL: 45 mg/dL (ref >40)
LDL CALC: 70 mg/dL (ref 0–99)
TRIGLYCERIDES: 96 mg/dL (ref <150)
Total CHOL/HDL Ratio: 3 RATIO
VLDL: 19 mg/dL (ref 0–40)

## 2017-11-25 LAB — ALT: ALT: 14 U/L (ref 14–54)

## 2017-12-15 ENCOUNTER — Encounter: Payer: Self-pay | Admitting: General Surgery

## 2017-12-15 ENCOUNTER — Ambulatory Visit: Payer: Medicare Other | Admitting: General Surgery

## 2017-12-15 VITALS — BP 152/74 | HR 80 | Resp 14 | Ht 62.5 in | Wt 176.0 lb

## 2017-12-15 DIAGNOSIS — D171 Benign lipomatous neoplasm of skin and subcutaneous tissue of trunk: Secondary | ICD-10-CM

## 2017-12-15 NOTE — Progress Notes (Signed)
Patient ID: Kelsey Mason, female   DOB: 05-29-1941, 77 y.o.   MRN: [id number]  Chief Complaint  Patient presents with  . Other    HPI Kelsey Mason is a 77 y.o. female.  Here for evaluation of a left abdominal mass referred by Dr Caryl Bis. She states she found it in December and it has gotten larger. She states that she has one on her right side as well that she found 2 years ago which has gotten larger size of ping pong ball. She also has one right neck that has been there for a year or two. She states the areas are tender, occasional sharp pains and when she stretches the pain goes away. Pain occurs about every 1-2 weeks. Abdominal ultrasound was 11-07-17.  HPI  Past Medical History:  Diagnosis Date  . Breast symptom 2014  . Cancer [facility]) 2014   Bilateral Breast, chemo Dr. Jeb Levering  . Diabetes mellitus   . GERD (gastroesophageal reflux disease)    mild  . Hypertension   . Personal history of chemotherapy   . Personal history of radiation therapy     Past Surgical History:  Procedure Laterality Date  . ABDOMINAL HYSTERECTOMY  1976   for pelvic pain  . BREAST EXCISIONAL BIOPSY Bilateral 11/2012   bilat breast ca chemo and rad  . BREAST SURGERY Bilateral 2014   bilateral lumpectomy and SN biopsy  . CHOLECYSTECTOMY  2007  . COLONOSCOPY WITH PROPOFOL N/A 10/13/2015   Procedure: COLONOSCOPY WITH PROPOFOL;  Surgeon: Manya Silvas, MD;  Location: [facility] ENDOSCOPY;  Service: Endoscopy;  Laterality: N/A;  . OOPHORECTOMY Right   . PORTACATH PLACEMENT  2014  . TUMOR REMOVAL Right 1980   right foot    Family History  Problem Relation Age of Onset  . Alcohol abuse Mother   . Heart disease Mother   . Hypertension Mother   . Breast cancer Mother 46  . Alcohol abuse Father   . Heart disease Father   . Hypertension Father   . Heart disease Brother     Social History Social History   Tobacco Use  . Smoking status: Former Smoker    Packs/day: 0.25    Years:  1.00    Pack years: 0.25    Types: Cigarettes    Last attempt to quit: 1963    Years since quitting: 56.4  . Smokeless tobacco: Never Used  Substance Use Topics  . Alcohol use: No    Alcohol/week: 0.0 oz  . Drug use: No    Allergies  Allergen Reactions  . Advil [Ibuprofen] Other (See Comments)    Dizziness   . Aleve [Naproxen] Other (See Comments)    Dizziness  . Fluticasone-Salmeterol Other (See Comments)    dizziness  . Lipitor [Atorvastatin] Other (See Comments)    Legs weak.  Leg pain, muscle ache  . Naproxen Sodium Other (See Comments)  . Penicillins Hives and Other (See Comments)    Current Outpatient Medications  Medication Sig Dispense Refill  . aspirin 81 MG tablet Take 81 mg by mouth daily.    . blood glucose meter kit and supplies KIT Dispense based on patient and insurance preference. Use up to four times daily as directed. One Touch Ultra mini meter (FOR ICD-9 250.00, 250.01). Dx: E11.9 1 each 0  . furosemide (LASIX) 20 MG tablet Take 20 mg by mouth daily as needed.  3  . glimepiride (AMARYL) 2 MG tablet TAKE 1 TABLET (2 MG TOTAL) BY MOUTH  2 (TWO) TIMES DAILY. 180 tablet 1  . glucose blood (ONE TOUCH ULTRA TEST) test strip USE TO TEST BLOOD SUGAR UP TO 4 TIMES DAILY 100 each 6  . LANTUS SOLOSTAR 100 UNIT/ML Solostar Pen INJECT 20 UNITS INTO THE SKIN DAILY AT 10 PM. (Patient taking differently: Inject 40 Units into the skin daily at 10 pm. ) 18 pen 0  . letrozole (FEMARA) 2.5 MG tablet TAKE 1 TABLET BY MOUTH EVERY DAY 90 tablet 2  . losartan (COZAAR) 50 MG tablet Take 2 tablets (100 mg total) by mouth daily. 90 tablet 3  . NOVOFINE 32G X 6 MM MISC USE AS DIRECTED WITH LANTUS PEN 100 each 2  . omeprazole (PRILOSEC) 20 MG capsule Take 1 capsule (20 mg total) by mouth daily. 14 capsule 0  . TRULICITY 1.5 HD/8.9BO SOPN Inject 1.5 mg as directed once a week.  11  . carvedilol (COREG) 12.5 MG tablet Take 1 tablet (12.5 mg total) by mouth 2 (two) times daily. 180 tablet  3  . rosuvastatin (CRESTOR) 5 MG tablet Take 1 tablet (5 mg total) by mouth daily. 90 tablet 3   No current facility-administered medications for this visit.     Review of Systems Review of Systems  Constitutional: Negative.   Respiratory: Negative.   Cardiovascular: Negative.   Gastrointestinal: Negative for constipation, diarrhea and nausea.    Blood pressure (!) 152/74, pulse 80, resp. rate 14, height 5' 2.5" (1.588 m), weight 176 lb (79.8 kg), SpO2 99 %.  Physical Exam Physical Exam  Constitutional: She is oriented to person, place, and time. She appears well-developed and well-nourished.  HENT:  Mouth/Throat: Oropharynx is clear and moist. No oropharyngeal exudate.  Eyes: Conjunctivae are normal. No scleral icterus.  Neck: Neck supple.  Cardiovascular: Normal rate, regular rhythm and normal heart sounds.  Pulmonary/Chest: Effort normal and breath sounds normal.    Abdominal:  1 cm nodule left anterior axillary line, 3 finger breathes above costal margin, with tenderness. Thickening right upper abdomen.   Lymphadenopathy:    She has no cervical adenopathy.  Neurological: She is alert and oriented to person, place, and time.  Skin: Skin is warm and dry.  Psychiatric: Her behavior is normal.    Data Reviewed Nov 07, 2017 soft tissue ultrasound reviewed.  No abnormality appreciated.  Assessment    Lipoma of the left lower chest wall.  Moderately symptomatic in spite of small size.    Plan    Discussed options observation vs office excision left upper abdomen nodule.     The patient will consider her options and notify the office if she would like to proceed with excision.  She is aware that the scar may cause local discomfort as well.  HPI, Physical Exam, Assessment and Plan have been scribed under the direction and in the presence of Robert Bellow, MD. Karie Fetch, RN  I have completed the exam and reviewed the above documentation for accuracy and  completeness.  I agree with the above.  Haematologist has been used and any errors in dictation or transcription are unintentional.  Hervey Ard, M.D., F.A.C.S.  Kelsey Mason 12/17/2017, 12:50 PM

## 2017-12-15 NOTE — Patient Instructions (Addendum)
The patient is aware to call back for any questions or new concerns.  Office excision left upper abdomen nodule

## 2017-12-17 DIAGNOSIS — D171 Benign lipomatous neoplasm of skin and subcutaneous tissue of trunk: Secondary | ICD-10-CM | POA: Insufficient documentation

## 2017-12-31 ENCOUNTER — Other Ambulatory Visit: Payer: Self-pay | Admitting: Family Medicine

## 2018-01-19 ENCOUNTER — Ambulatory Visit: Payer: Medicare Other | Admitting: General Surgery

## 2018-01-23 ENCOUNTER — Encounter: Payer: Self-pay | Admitting: Family Medicine

## 2018-01-23 ENCOUNTER — Ambulatory Visit: Payer: Medicare Other | Admitting: Family Medicine

## 2018-01-23 VITALS — BP 148/70 | HR 72 | Temp 98.6°F | Ht 63.0 in | Wt 174.2 lb

## 2018-01-23 DIAGNOSIS — R0989 Other specified symptoms and signs involving the circulatory and respiratory systems: Secondary | ICD-10-CM | POA: Diagnosis not present

## 2018-01-23 DIAGNOSIS — Z794 Long term (current) use of insulin: Secondary | ICD-10-CM

## 2018-01-23 DIAGNOSIS — D72829 Elevated white blood cell count, unspecified: Secondary | ICD-10-CM | POA: Insufficient documentation

## 2018-01-23 DIAGNOSIS — E785 Hyperlipidemia, unspecified: Secondary | ICD-10-CM

## 2018-01-23 DIAGNOSIS — E1122 Type 2 diabetes mellitus with diabetic chronic kidney disease: Secondary | ICD-10-CM

## 2018-01-23 DIAGNOSIS — I10 Essential (primary) hypertension: Secondary | ICD-10-CM | POA: Diagnosis not present

## 2018-01-23 DIAGNOSIS — N183 Chronic kidney disease, stage 3 (moderate): Secondary | ICD-10-CM

## 2018-01-23 NOTE — Assessment & Plan Note (Signed)
Well-controlled at home.  Continue current regimen.

## 2018-01-23 NOTE — Progress Notes (Signed)
  [name], MD Phone: [phone]  Kelsey Mason is a 77 y.o. female who presents today for f/u.  CC: Diabetes, hypertension, hyperlipidemia  HYPERTENSION  Disease Monitoring  Home BP Monitoring 110-143/53-70  Chest pain- no    Dyspnea- no Medications  Compliance-  Taking coreg, losartan, prn lasix.  Edema- rare, no PND, no orthopnea  DIABETES Disease Monitoring: Blood Sugar ranges-70-79 some higher Polyuria/phagia/dipsia- no      Optho- UTD Medications: Compliance-taking Lantus 44 units daily, glyburide, Trulicity                                            Hypoglycemic symptoms-occasionally in the morning, she will take an something sweet and feel improved Patient is followed by endocrinology.  Recent A1c 7.6.  HYPERLIPIDEMIA Symptoms Chest pain on exertion:  no   Medications: Compliance- taking crestor Right upper quadrant pain- no  Muscle aches- no  Patient does note her brother passed away recently.  She is dealing with this fairly well.  I offered support.    Social History   Tobacco Use  Smoking Status Former Smoker  . Packs/day: 0.25  . Years: 1.00  . Pack years: 0.25  . Types: Cigarettes  . Last attempt to quit: 1963  . Years since quitting: 56.5  Smokeless Tobacco Never Used     ROS see history of present illness  Objective  Physical Exam Vitals:   01/23/18 1017  BP: (!) 148/70  Pulse: 72  Temp: 98.6 F (37 C)  SpO2: 98%    BP Readings from Last 3 Encounters:  01/23/18 (!) 148/70  12/15/17 (!) 152/74  10/24/17 (!) 150/86   Wt Readings from Last 3 Encounters:  01/23/18 174 lb 3.2 oz (79 kg)  12/15/17 176 lb (79.8 kg)  10/24/17 176 lb (79.8 kg)    Physical Exam  Constitutional: No distress.  Cardiovascular: Normal rate, regular rhythm and normal heart sounds.  Pulmonary/Chest: Effort normal and breath sounds normal.  Musculoskeletal: She exhibits no edema.  Neurological: She is alert.  Skin: Skin is warm and dry. She  is not diaphoretic.   Diabetic Foot Exam - Simple   Simple Foot Form Diabetic Foot exam was performed with the following findings:  Yes 01/23/2018 10:59 AM  Visual Inspection See comments:  Yes Sensation Testing Intact to touch and monofilament testing bilaterally:  Yes Pulse Check See comments:  Yes Comments Pes planus with loss of transverse and longitudinal arch, slight discomfort over the left second metatarsal head distally with no overlying skin changes, no other deformities, ulcerations, or skin breakdown, difficult to palpate DP and PT pulses bilaterally      Assessment/Plan: Please see individual problem list.  Hypertension Well-controlled at home.  Continue current regimen.  Type 2 diabetes mellitus with stage 3 chronic kidney disease, with long-term current use of insulin ([place]) She follows with endocrinology.  She will continue her current regimen through them.  Hyperlipidemia Continue Crestor.  Leukocytosis Noted on prior lab work.  Suggested recheck previously though she deferred.  She notes her oncologist is aware of this and will follow  Decreased pedal pulses Check ABIs.   No orders of the defined types were placed in this encounter.   No orders of the defined types were placed in this encounter.    [name], MD [place]

## 2018-01-23 NOTE — Assessment & Plan Note (Signed)
Continue Crestor 

## 2018-01-23 NOTE — Assessment & Plan Note (Signed)
Noted on prior lab work.  Suggested recheck previously though she deferred.  She notes her oncologist is aware of this and will follow

## 2018-01-23 NOTE — Assessment & Plan Note (Signed)
She follows with endocrinology.  She will continue her current regimen through them.

## 2018-01-23 NOTE — Patient Instructions (Signed)
Nice to see you. We will get ABIs to evaluate your difficult to feel pulses. Please continue to periodically monitor your blood pressure and monitor your blood sugar.

## 2018-01-23 NOTE — Assessment & Plan Note (Signed)
Check ABIs. 

## 2018-01-31 ENCOUNTER — Telehealth: Payer: Self-pay | Admitting: Family Medicine

## 2018-01-31 DIAGNOSIS — R0989 Other specified symptoms and signs involving the circulatory and respiratory systems: Secondary | ICD-10-CM

## 2018-01-31 NOTE — Telephone Encounter (Signed)
Copied from [place] [id number]. Topic: Quick Communication - See Telephone Encounter >> Jan 31, 2018 10:08 AM Mylinda Latina, NT wrote: CRM for notification. See Telephone encounter for: 01/31/18. Gabriel Cirri calling from [facility] is stating that the provider put in  orders for an ABI . That is only done with a LEA. When the LEA is done it will also include the ABI. Please place order for a LEA or call to provide verbals CB# [phone]

## 2018-01-31 NOTE — Telephone Encounter (Signed)
Please advise 

## 2018-01-31 NOTE — Telephone Encounter (Signed)
Order change. Is this ok

## 2018-01-31 NOTE — Telephone Encounter (Signed)
Please find out what that stands for and what the order number is.  That is not an order I have ever placed.  Thanks.

## 2018-02-01 NOTE — Telephone Encounter (Signed)
Lower extremity arterial.  Order number is [id number] I have pended the correct order

## 2018-02-01 NOTE — Telephone Encounter (Signed)
Ordered

## 2018-02-02 ENCOUNTER — Other Ambulatory Visit: Payer: Self-pay | Admitting: Internal Medicine

## 2018-02-02 ENCOUNTER — Other Ambulatory Visit: Payer: Self-pay | Admitting: Family Medicine

## 2018-02-22 ENCOUNTER — Ambulatory Visit (INDEPENDENT_AMBULATORY_CARE_PROVIDER_SITE_OTHER): Payer: Medicare Other

## 2018-02-22 DIAGNOSIS — R0989 Other specified symptoms and signs involving the circulatory and respiratory systems: Secondary | ICD-10-CM

## 2018-02-25 ENCOUNTER — Other Ambulatory Visit: Payer: Self-pay | Admitting: Internal Medicine

## 2018-03-01 ENCOUNTER — Encounter: Payer: Self-pay | Admitting: Internal Medicine

## 2018-03-01 ENCOUNTER — Ambulatory Visit: Payer: Medicare Other | Admitting: Internal Medicine

## 2018-03-01 DIAGNOSIS — R0609 Other forms of dyspnea: Secondary | ICD-10-CM | POA: Diagnosis not present

## 2018-03-01 DIAGNOSIS — R5383 Other fatigue: Secondary | ICD-10-CM

## 2018-03-01 DIAGNOSIS — I428 Other cardiomyopathies: Secondary | ICD-10-CM | POA: Diagnosis not present

## 2018-03-01 DIAGNOSIS — M79604 Pain in right leg: Secondary | ICD-10-CM

## 2018-03-01 DIAGNOSIS — M79605 Pain in left leg: Secondary | ICD-10-CM

## 2018-03-01 DIAGNOSIS — I10 Essential (primary) hypertension: Secondary | ICD-10-CM

## 2018-03-01 NOTE — Patient Instructions (Signed)
Medication Instructions:  Your physician recommends that you continue on your current medications as directed. Please refer to the Current Medication list given to you today.   Labwork: none  Testing/Procedures: Your physician has requested that you have an echocardiogram. Echocardiography is a painless test that uses sound waves to create images of your heart. It provides your doctor with information about the size and shape of your heart and how well your heart's chambers and valves are working. This procedure takes approximately one hour. There are no restrictions for this procedure. You may get an IV, if needed, to receive an ultrasound enhancing agent through to better visualize your heart.    Follow-Up: Your physician recommends that you schedule a follow-up appointment in: 3 MONTHS WITH DR END.  If you need a refill on your cardiac medications before your next appointment, please call your pharmacy.   Echocardiogram An echocardiogram, or echocardiography, uses sound waves (ultrasound) to produce an image of your heart. The echocardiogram is simple, painless, obtained within a short period of time, and offers valuable information to your health care provider. The images from an echocardiogram can provide information such as:  Evidence of coronary artery disease (CAD).  Heart size.  Heart muscle function.  Heart valve function.  Aneurysm detection.  Evidence of a past heart attack.  Fluid buildup around the heart.  Heart muscle thickening.  Assess heart valve function.  Tell a health care provider about:  Any allergies you have.  All medicines you are taking, including vitamins, herbs, eye drops, creams, and over-the-counter medicines.  Any problems you or family members have had with anesthetic medicines.  Any blood disorders you have.  Any surgeries you have had.  Any medical conditions you have.  Whether you are pregnant or may be pregnant. What happens  before the procedure? No special preparation is needed. Eat and drink normally. What happens during the procedure?  In order to produce an image of your heart, gel will be applied to your chest and a wand-like tool (transducer) will be moved over your chest. The gel will help transmit the sound waves from the transducer. The sound waves will harmlessly bounce off your heart to allow the heart images to be captured in real-time motion. These images will then be recorded.  You may need an IV to receive a medicine that improves the quality of the pictures. What happens after the procedure? You may return to your normal schedule including diet, activities, and medicines, unless your health care provider tells you otherwise. This information is not intended to replace advice given to you by your health care provider. Make sure you discuss any questions you have with your health care provider. Document Released: 06/18/2000 Document Revised: 02/07/2016 Document Reviewed: 02/26/2013 Elsevier Interactive Patient Education  2017 [employer].

## 2018-03-01 NOTE — Progress Notes (Signed)
Follow-up Outpatient Visit Date: 03/01/2018  Primary Care Provider: Leone Haven, MD [address] [place] [address]  Chief Complaint: Fatigue and chronic leg pain  HPI:  Kelsey Mason is a 77 y.o. year-old female with history of nonischemic cardiomyopathy with normalization of LVEF, mitral regurgitation, hypertension, hyperlipidemia, type 2 diabetes mellitus, mild-moderate renal artery stenosis, and breast cancer, who presents for evaluation of decreased pedal pulses noted by her PCP, Dr. Biagio Quint, earlier this week and subsequent ABIs demonstrating noncompressible ankle arteries.  I last saw Kelsey Mason in late February, at which time she was doing well other than long-standing intermittent low back and leg discomfort.  Prior lower extremity arterial vascular study in 06/2016 showed calcified ankle arteries but no evidence of significant PAD based on waveforms and TBI's.  Today, Kelsey Mason reports that she has calf discomfort with walking and dancing, which has been present for years.  It is not significantly different than at our last visit.  She does not have any pain at rest.  She does not have any wounds on her lower extremities.  She notes feeling fatigue and exertional dyspnea at times, which are the primary limiting factors for her activity.  She wonders if this could be related to some of her medications, including letrozole that she has been taking for history of breast cancer.  She denies chest pain, palpitations, lightheadedness, edema, and shortness of breath at rest or with mild activity.  --------------------------------------------------------------------------------------------------  Cardiovascular History & Procedures: Cardiovascular Problems:  Non-ischemic cardiomyopathy with normalization of LVEF  Mitral regurgitation  Risk Factors:  HTN, HLD, DM2, and age > 20  Cath/PCI:  None  CV Surgery:  None  EP Procedures and  Devices:  None  Non-Invasive Evaluation(s):  ABIs (02/22/2018): Noncompressible runoff vessels bilaterally with triphasic waveforms.  TBI's are normal bilaterally.  Renal artery Doppler (04/20/17): Mild to moderate (less than 60%) stenosis of the right renal artery. No significant left renal artery disease.  TTE (01/14/17): Normal LV size with LVEF 50-55%. Anteroseptal hypokinesis is noted, as well as grade 1 diastolic dysfunction. Mild to moderate MR. Normal RV size and function. Mild pulmonary hypertension.  Pharmacologic myocardial perfusion stress test (06/16/16): Low risk study with moderate in size, moderate in severity, fixed defect involving the septum most likely related to LBBB. No evidence of ischemia. LVEF 50% by automated calculation and likely higher based on visual estimation.  ABIs (06/10/16): ABIs: Right not obtainable, left 1.4. TBIs right 1.1, left not obtainable. Bilateral great toe PPG's are normal. Bilateral common femoral, popliteal, peroneal, anterior tibial, and posterior tibial artery waveforms are brisk and triphasic.  TTE (02/23/16, [facility]): Mildly dilated left ventricle with mild to moderate LV dysfunction (EF 35-45%). Grade 1 diastolic dysfunction. Moderate mitral regurgitation. Mild left atrial enlargement. Normal RV function.  TTE (01/01/14, [facility]): Moderate to severe LV dysfunction (EF 30%) with mild LVH and mild left ventricular dilation. Mitral annular calcification with moderate MR and moderate TR noted. Normal right ventricular contraction.  MUGA (12/25/12): Normal contraction and wall motion. EF 63%.  Recent CV Pertinent Labs: Lab Results  Component Value Date   CHOL 134 11/25/2017   HDL 45 11/25/2017   LDLCALC 70 11/25/2017   LDLDIRECT 178.7 10/02/2012   TRIG 96 11/25/2017   CHOLHDL 3.0 11/25/2017   K 4.3 10/24/2017   K 4.7 01/07/2014   BUN 14 10/24/2017   BUN 19 05/30/2017   BUN 19 (H) 01/07/2014   CREATININE 1.17 10/24/2017  CREATININE 1.24 01/07/2014    Past medical and surgical history were reviewed and updated in EPIC.  Current Meds  Medication Sig  . losartan (COZAAR) 50 MG tablet Take 50 mg by mouth 2 (two) times daily.  [name] NOVOFINE 32G X 6 MM MISC USE AS DIRECTED WITH LANTUS PEN  . omeprazole (PRILOSEC) 20 MG capsule Take 1 capsule (20 mg total) by mouth daily.  . rosuvastatin (CRESTOR) 5 MG tablet Take 1 tablet (5 mg total) by mouth daily.  . TRULICITY 1.5 WN/4.6EV SOPN Inject 1.5 mg as directed once a week.    Allergies: Advil [ibuprofen]; Aleve [naproxen]; Fluticasone-salmeterol; Lipitor [atorvastatin]; Naproxen sodium; and Penicillins  Social History   Tobacco Use  . Smoking status: Former Smoker    Packs/day: 0.25    Years: 1.00    Pack years: 0.25    Types: Cigarettes    Last attempt to quit: 1963    Years since quitting: 56.6  . Smokeless tobacco: Never Used  Substance Use Topics  . Alcohol use: No    Alcohol/week: 0.0 standard drinks  . Drug use: No    Family History  Problem Relation Age of Onset  . Alcohol abuse Mother   . Heart disease Mother   . Hypertension Mother   . Breast cancer Mother 92  . Alcohol abuse Father   . Heart disease Father   . Hypertension Father   . Heart disease Brother     Review of Systems: A 12-system review of systems was performed and was negative except as noted in the HPI.  --------------------------------------------------------------------------------------------------  Physical Exam: BP 134/70 (BP Location: Right Arm, Patient Position: Sitting, Cuff Size: Normal)   Pulse 78   Ht 5' 2.5" (1.588 m)   Wt 175 lb (79.4 kg)   SpO2 98%   BMI 31.50 kg/m   General: NAD. HEENT: No conjunctival pallor or scleral icterus. Moist mucous membranes.  OP clear. Neck: Supple without lymphadenopathy, thyromegaly, JVD, or HJR. Lungs: Normal work of breathing. Clear to auscultation bilaterally without wheezes or crackles. Heart: Regular rate and  rhythm with 2/6 holosystolic murmur loudest at the left lower sternal border.  No rubs or gallops.  Non-displaced PMI. Abd: Bowel sounds present. Soft, NT/ND without hepatosplenomegaly Ext: No lower extremity edema.  2+ radial pulses.  1+ posterior tibial and dorsalis pedis pulses bilaterally. Skin: Warm and dry without rash.  EKG: Normal sinus rhythm with left axis deviation and left bundle branch block.  No significant change since 08/31/2017.  Lab Results  Component Value Date   WBC 11.1 (H) 10/24/2017   HGB 12.0 10/24/2017   HCT 35.4 (L) 10/24/2017   MCV 86.9 10/24/2017   PLT 213.0 10/24/2017    Lab Results  Component Value Date   NA 136 10/24/2017   K 4.3 10/24/2017   CL 100 10/24/2017   CO2 27 10/24/2017   BUN 14 10/24/2017   CREATININE 1.17 10/24/2017   GLUCOSE 132 (H) 10/24/2017   ALT 14 11/25/2017    Lab Results  Component Value Date   CHOL 134 11/25/2017   HDL 45 11/25/2017   LDLCALC 70 11/25/2017   LDLDIRECT 178.7 10/02/2012   TRIG 96 11/25/2017   CHOLHDL 3.0 11/25/2017    --------------------------------------------------------------------------------------------------  ASSESSMENT AND PLAN: Leg pain Long-standing and stable.  Recent ABIs as well as studies in 2017 showed noncompressible runoff vessels consistent with medial calcification.  TBI's are normal and runoff waveforms are triphasic, suggesting that she does not have obstructive PAD involving the  inflow, outflow, or runoff vessels.  She may have an element of microvascular dysfunction contributing to her symptoms.  Given that she does not have critical limb ischemia, we have agreed to defer additional testing.  Aspirin and rosuvastatin should be continued.  Dyspnea on exertion and fatigue This has been a chronic problem but may be a little bit worse at today's visit and is the main limitation to activity.  Given history of nonischemic cardiomyopathy with normalization of LVEF and mild to moderate mitral  regurgitation, we have agreed to repeat an echocardiogram for further assessment.  Ms. Maners appears euvolemic today; we will continue her current dose of furosemide 20 mg daily as well as carvedilol 12.5 mg twice daily and losartan 50 mg daily.  If LVEF has declined or symptoms worsen, left and right heart catheterization would need to be considered in the future.  Hypertension Blood pressure mildly elevated (goal less than 130/80).  Continue current medications.  I encouraged weight loss and sodium restriction.  Follow-up: Return to clinic in 3 months.  Nelva Bush, MD 03/02/2018 9:56 PM

## 2018-03-02 ENCOUNTER — Encounter: Payer: Self-pay | Admitting: Internal Medicine

## 2018-03-02 DIAGNOSIS — R0609 Other forms of dyspnea: Secondary | ICD-10-CM

## 2018-03-02 DIAGNOSIS — R5383 Other fatigue: Secondary | ICD-10-CM | POA: Insufficient documentation

## 2018-03-13 ENCOUNTER — Inpatient Hospital Stay: Payer: Medicare Other | Admitting: Hematology and Oncology

## 2018-03-13 ENCOUNTER — Encounter: Payer: Self-pay | Admitting: Hematology and Oncology

## 2018-03-13 ENCOUNTER — Inpatient Hospital Stay: Payer: Medicare Other | Attending: Hematology and Oncology

## 2018-03-13 ENCOUNTER — Other Ambulatory Visit: Payer: Self-pay | Admitting: Hematology and Oncology

## 2018-03-13 VITALS — BP 145/65 | HR 80 | Temp 98.4°F | Wt 175.0 lb

## 2018-03-13 DIAGNOSIS — Z79811 Long term (current) use of aromatase inhibitors: Secondary | ICD-10-CM

## 2018-03-13 DIAGNOSIS — Z87891 Personal history of nicotine dependence: Secondary | ICD-10-CM | POA: Diagnosis not present

## 2018-03-13 DIAGNOSIS — C50911 Malignant neoplasm of unspecified site of right female breast: Secondary | ICD-10-CM | POA: Diagnosis not present

## 2018-03-13 DIAGNOSIS — Z170 Estrogen receptor positive status [ER+]: Secondary | ICD-10-CM

## 2018-03-13 DIAGNOSIS — Z79899 Other long term (current) drug therapy: Secondary | ICD-10-CM | POA: Diagnosis not present

## 2018-03-13 DIAGNOSIS — Z923 Personal history of irradiation: Secondary | ICD-10-CM | POA: Diagnosis not present

## 2018-03-13 DIAGNOSIS — E119 Type 2 diabetes mellitus without complications: Secondary | ICD-10-CM | POA: Insufficient documentation

## 2018-03-13 DIAGNOSIS — I509 Heart failure, unspecified: Secondary | ICD-10-CM

## 2018-03-13 DIAGNOSIS — Z7982 Long term (current) use of aspirin: Secondary | ICD-10-CM | POA: Diagnosis not present

## 2018-03-13 DIAGNOSIS — K219 Gastro-esophageal reflux disease without esophagitis: Secondary | ICD-10-CM

## 2018-03-13 DIAGNOSIS — D649 Anemia, unspecified: Secondary | ICD-10-CM | POA: Diagnosis not present

## 2018-03-13 DIAGNOSIS — G629 Polyneuropathy, unspecified: Secondary | ICD-10-CM | POA: Diagnosis not present

## 2018-03-13 DIAGNOSIS — I110 Hypertensive heart disease with heart failure: Secondary | ICD-10-CM | POA: Insufficient documentation

## 2018-03-13 DIAGNOSIS — Z794 Long term (current) use of insulin: Secondary | ICD-10-CM | POA: Insufficient documentation

## 2018-03-13 DIAGNOSIS — C50912 Malignant neoplasm of unspecified site of left female breast: Secondary | ICD-10-CM | POA: Diagnosis not present

## 2018-03-13 DIAGNOSIS — E538 Deficiency of other specified B group vitamins: Secondary | ICD-10-CM

## 2018-03-13 DIAGNOSIS — Z853 Personal history of malignant neoplasm of breast: Secondary | ICD-10-CM

## 2018-03-13 DIAGNOSIS — Z9221 Personal history of antineoplastic chemotherapy: Secondary | ICD-10-CM | POA: Insufficient documentation

## 2018-03-13 DIAGNOSIS — C50312 Malignant neoplasm of lower-inner quadrant of left female breast: Secondary | ICD-10-CM

## 2018-03-13 LAB — COMPREHENSIVE METABOLIC PANEL
ALT: 16 U/L (ref 0–44)
AST: 17 U/L (ref 15–41)
Albumin: 4.2 g/dL (ref 3.5–5.0)
Alkaline Phosphatase: 73 U/L (ref 38–126)
Anion gap: 7 (ref 5–15)
BUN: 19 mg/dL (ref 8–23)
CO2: 25 mmol/L (ref 22–32)
Calcium: 8.8 mg/dL — ABNORMAL LOW (ref 8.9–10.3)
Chloride: 102 mmol/L (ref 98–111)
Creatinine, Ser: 1.23 mg/dL — ABNORMAL HIGH (ref 0.44–1.00)
GFR calc Af Amer: 48 mL/min — ABNORMAL LOW (ref >60)
GFR calc non Af Amer: 41 mL/min — ABNORMAL LOW (ref >60)
Glucose, Bld: 126 mg/dL — ABNORMAL HIGH (ref 70–99)
Potassium: 4.2 mmol/L (ref 3.5–5.1)
Sodium: 134 mmol/L — ABNORMAL LOW (ref 135–145)
Total Bilirubin: 0.6 mg/dL (ref 0.3–1.2)
Total Protein: 6.3 g/dL — ABNORMAL LOW (ref 6.5–8.1)

## 2018-03-13 LAB — CBC WITH DIFFERENTIAL/PLATELET
Basophils Absolute: 0.1 10*3/uL (ref 0–0.1)
Basophils Relative: 1 %
Eosinophils Absolute: 0.8 10*3/uL — ABNORMAL HIGH (ref 0–0.7)
Eosinophils Relative: 9 %
HCT: 34.1 % — ABNORMAL LOW (ref 35.0–47.0)
Hemoglobin: 11.4 g/dL — ABNORMAL LOW (ref 12.0–16.0)
Lymphocytes Relative: 16 %
Lymphs Abs: 1.4 10*3/uL (ref 1.0–3.6)
MCH: 29.8 pg (ref 26.0–34.0)
MCHC: 33.5 g/dL (ref 32.0–36.0)
MCV: 89.2 fL (ref 80.0–100.0)
Monocytes Absolute: 0.6 10*3/uL (ref 0.2–0.9)
Monocytes Relative: 7 %
Neutro Abs: 6 10*3/uL (ref 1.4–6.5)
Neutrophils Relative %: 67 %
Platelets: 191 10*3/uL (ref 150–440)
RBC: 3.82 MIL/uL (ref 3.80–5.20)
RDW: 13.7 % (ref 11.5–14.5)
WBC: 8.9 10*3/uL (ref 3.6–11.0)

## 2018-03-13 LAB — FOLATE: Folate: 12.7 ng/mL (ref >5.9)

## 2018-03-13 NOTE — Progress Notes (Signed)
[facility] day:  03/13/2018  Chief Complaint: Kelsey Mason is a 77 y.o. female with bilateral stage IA breast cancer who is seen for 6 month assessment on Femara.  HPI: The patient was last seen in the medical oncology clinic on 09/12/2017.  At that time, she was feeling pretty good.  She denied acute concerns. Exam was stable.  CA27.29 was normal.  At last visit, she had new anemia.  Hematocrit was 32.7, hemoglobin 11.1, and MCV 87.7.  Hemoglobin was 13.1 on 03/14/2017.  Creatinine was 1.14.  Ferritin was 130.  Iron saturation was 15% with a TIBC of 275.  Folate was 13.6.  CBC on 10/24/2017 revealed a hematocrit of 35.4, hemoglobin 12.0, and MCV 86.9.  BCI testing on 10/04/2017 revealed a high risk of late recurrence (5.4%; CI 2.5%-8.2%) during years 6-10.  There was a low likelihood of benefit from extended endocrine therapy.  Bilateral mammogram is scheduled for 04/03/2018.  During the interim, she has felt "pretty good".  She denies any breast concerns.  She performs monthly exams.  She has a little neuropathy in her fingers and toes at times.  She has back pain when she uses the weed eater.  She denies any melena or hematochezia.   Past Medical History:  Diagnosis Date  . Breast symptom 2014  . Cancer [facility]) 2014   Bilateral Breast, chemo Dr. Jeb Levering  . Diabetes mellitus   . GERD (gastroesophageal reflux disease)    mild  . Hypertension   . Personal history of chemotherapy   . Personal history of radiation therapy     Past Surgical History:  Procedure Laterality Date  . ABDOMINAL HYSTERECTOMY  1976   for pelvic pain  . BREAST EXCISIONAL BIOPSY Bilateral 11/2012   bilat breast ca chemo and rad  . BREAST SURGERY Bilateral 2014   bilateral lumpectomy and SN biopsy  . CHOLECYSTECTOMY  2007  . COLONOSCOPY WITH PROPOFOL N/A 10/13/2015   Procedure: COLONOSCOPY WITH PROPOFOL;  Surgeon: Manya Silvas, MD;  Location: [facility]  ENDOSCOPY;  Service: Endoscopy;  Laterality: N/A;  . OOPHORECTOMY Right   . PORTACATH PLACEMENT  2014  . TUMOR REMOVAL Right 1980   right foot    Family History  Problem Relation Age of Onset  . Alcohol abuse Mother   . Heart disease Mother   . Hypertension Mother   . Breast cancer Mother 58  . Alcohol abuse Father   . Heart disease Father   . Hypertension Father   . Heart disease Brother     Social History:  reports that she quit smoking about 56 years ago. Her smoking use included cigarettes. She has a 0.25 pack-year smoking history. She has never used smokeless tobacco. She reports that she does not drink alcohol or use drugs.  She likes to do line dancing.  She lives in [place].  The patient is alone today.  Allergies:  Allergies  Allergen Reactions  . Advil [Ibuprofen] Other (See Comments)    Dizziness   . Aleve [Naproxen] Other (See Comments)    Dizziness  . Fluticasone-Salmeterol Other (See Comments)    dizziness  . Lipitor [Atorvastatin] Other (See Comments)    Legs weak.  Leg pain, muscle ache  . Naproxen Sodium Other (See Comments)  . Penicillins Hives and Other (See Comments)    Current Medications: Current Outpatient Medications  Medication Sig Dispense Refill  . aspirin 81 MG tablet Take 81 mg by mouth daily.    [name]  blood glucose meter kit and supplies KIT Dispense based on patient and insurance preference. Use up to four times daily as directed. One Touch Ultra mini meter (FOR ICD-9 250.00, 250.01). Dx: E11.9 1 each 0  . carvedilol (COREG) 12.5 MG tablet Take 1 tablet (12.5 mg total) by mouth 2 (two) times daily. 180 tablet 3  . furosemide (LASIX) 20 MG tablet Take 20 mg by mouth daily as needed.  3  . glimepiride (AMARYL) 2 MG tablet TAKE 1 TABLET (2 MG TOTAL) BY MOUTH 2 (TWO) TIMES DAILY. 180 tablet 1  . glucose blood (ONE TOUCH ULTRA TEST) test strip USE TO TEST BLOOD SUGAR UP TO 4 TIMES DAILY 100 each 6  . LANTUS SOLOSTAR 100 UNIT/ML Solostar Pen  INJECT 20 UNITS INTO THE SKIN DAILY AT 10 PM. (Patient taking differently: Inject 40 Units into the skin daily at 10 pm. ) 18 pen 0  . letrozole (FEMARA) 2.5 MG tablet TAKE 1 TABLET BY MOUTH EVERY DAY 90 tablet 2  . losartan (COZAAR) 50 MG tablet Take 50 mg by mouth 2 (two) times daily.    [name] NOVOFINE 32G X 6 MM MISC USE AS DIRECTED WITH LANTUS PEN 100 each 2  . omeprazole (PRILOSEC) 20 MG capsule Take 1 capsule (20 mg total) by mouth daily. 14 capsule 0  . rosuvastatin (CRESTOR) 5 MG tablet Take 1 tablet (5 mg total) by mouth daily. 90 tablet 3  . TRULICITY 1.5 HU/3.1SH SOPN Inject 1.5 mg as directed once a week.  11   No current facility-administered medications for this visit.     Review of Systems:  GENERAL:  Feels "pretty good".  Active.  No fevers, sweats.  Weight down 1 pound. PERFORMANCE STATUS (ECOG):  1 HEENT:  No visual changes, runny nose, sore throat, mouth sores or tenderness. Lungs: Shortness of breath with exertion.  No cough.  No hemoptysis. Cardiac:  No chest pain, palpitations, orthopnea, or PND. GI:  Some constipation.  No nausea, vomiting, diarrhea, melena or hematochezia. GU:  No urgency, frequency, dysuria, or hematuria. Musculoskeletal:  Intermittent back pain.  No joint pain.  No muscle tenderness. Extremities:  No pain or swelling. Skin:  No rashes or skin changes. Neuro:  Neuropathy (fingers and toes at times).  No headache, weakness, balance or coordination issues. Endocrine:  No diabetes, thyroid issues, hot flashes or night sweats. Psych:  No mood changes, depression or anxiety. Pain:  No focal pain. Review of systems:  All other systems reviewed and found to be negative.   Physical Exam: Blood pressure (!) 145/65, pulse 80, temperature 98.4 F (36.9 C), temperature source Tympanic, weight 175 lb (79.4 kg). GENERAL:  Well developed, well nourished, woman sitting comfortably in the exam room in no acute distress. MENTAL STATUS:  Alert and oriented to  person, place and time. HEAD:  Normocephalic, atraumatic, face symmetric, no Cushingoid features. EYES:  Blue eyes.  Pupils equal round and reactive to light and accomodation.  No conjunctivitis or scleral icterus. ENT:  Oropharynx clear without lesion.  Tongue normal. Mucous membranes moist.  RESPIRATORY:  Clear to auscultation without rales, wheezes or rhonchi. CARDIOVASCULAR:  Regular rate and rhythm without murmur, rub or gallop. BREAST:  Right breast without masses, skin changes or nipple discharge.  Left breast without masses, skin changes or nipple discharge. Tender fibrocystic changes bilaterally. ABDOMEN:  Soft, non-tender, with active bowel sounds, and no hepatosplenomegaly.  No masses. SKIN:  No rashes, ulcers or lesions. EXTREMITIES: Bilateral ankle edema (1+ left >  trace right).  No skin discoloration or tenderness.  No palpable cords. LYMPH NODES: No palpable cervical, supraclavicular, axillary or inguinal adenopathy  NEUROLOGICAL: Unremarkable. PSYCH:  Appropriate.    Appointment on 03/13/2018  Component Date Value Ref Range Status  . Sodium 03/13/2018 134* 135 - 145 mmol/L Final  . Potassium 03/13/2018 4.2  3.5 - 5.1 mmol/L Final  . Chloride 03/13/2018 102  98 - 111 mmol/L Final  . CO2 03/13/2018 25  22 - 32 mmol/L Final  . Glucose, Bld 03/13/2018 126* 70 - 99 mg/dL Final  . BUN 03/13/2018 19  8 - 23 mg/dL Final  . Creatinine, Ser 03/13/2018 1.23* 0.44 - 1.00 mg/dL Final  . Calcium 03/13/2018 8.8* 8.9 - 10.3 mg/dL Final  . Total Protein 03/13/2018 6.3* 6.5 - 8.1 g/dL Final  . Albumin 03/13/2018 4.2  3.5 - 5.0 g/dL Final  . AST 03/13/2018 17  15 - 41 U/L Final  . ALT 03/13/2018 16  0 - 44 U/L Final  . Alkaline Phosphatase 03/13/2018 73  38 - 126 U/L Final  . Total Bilirubin 03/13/2018 0.6  0.3 - 1.2 mg/dL Final  . GFR calc non Af Amer 03/13/2018 41* >60 mL/min Final  . GFR calc Af Amer 03/13/2018 48* >60 mL/min Final   Comment: (NOTE) The eGFR has been calculated  using the CKD EPI equation. This calculation has not been validated in all clinical situations. eGFR's persistently <60 mL/min signify possible Chronic Kidney Disease.   Georgiann Hahn gap 03/13/2018 7  5 - 15 Final   Performed at [facility], [address] [place], [place] [address]  . WBC 03/13/2018 8.9  3.6 - 11.0 K/uL Final  . RBC 03/13/2018 3.82  3.80 - 5.20 MIL/uL Final  . Hemoglobin 03/13/2018 11.4* 12.0 - 16.0 g/dL Final  . HCT 03/13/2018 34.1* 35.0 - 47.0 % Final  . MCV 03/13/2018 89.2  80.0 - 100.0 fL Final  . MCH 03/13/2018 29.8  26.0 - 34.0 pg Final  . MCHC 03/13/2018 33.5  32.0 - 36.0 g/dL Final  . RDW 03/13/2018 13.7  11.5 - 14.5 % Final  . Platelets 03/13/2018 191  150 - 440 K/uL Final  . Neutrophils Relative % 03/13/2018 67  % Final  . Neutro Abs 03/13/2018 6.0  1.4 - 6.5 K/uL Final  . Lymphocytes Relative 03/13/2018 16  % Final  . Lymphs Abs 03/13/2018 1.4  1.0 - 3.6 K/uL Final  . Monocytes Relative 03/13/2018 7  % Final  . Monocytes Absolute 03/13/2018 0.6  0.2 - 0.9 K/uL Final  . Eosinophils Relative 03/13/2018 9  % Final  . Eosinophils Absolute 03/13/2018 0.8* 0 - 0.7 K/uL Final  . Basophils Relative 03/13/2018 1  % Final  . Basophils Absolute 03/13/2018 0.1  0 - 0.1 K/uL Final   Performed at [facility], [address] [place], [place] [address]    Assessment:  Tamani Durney is a 77 y.o. female with bilateral breast cancer s/p lumpectomy and sentinel lymph node biopsy on 11/17/2012.  Right breast revealed a 5 mm grade I invasive carcinoma.  There was no DCIS.  Two sentinel lymph nodes were negative.  Pathologic stage was T1aN0M0.  Tumor was ER + (90%), PR + (20%), and Her2/neu -.   Left breast revealed a 1.8 cm grade III invasive ductal carcinoma.  There was lymphovascular invasion.  There was no DCIS.  One sentinel lymph node was negative.  Pathologic stage was T1cN0M0.  Tumor was ER + (>  90%), PR + (1-5%), and Her2/neu -.  Oncotype DX  testing on the left breast mass revealed a recurrence score of 28 which corresponded to a 10 year risk of distant recurrence of 19% (CI 15-23%) with tamoxifen alone.  BCI testing on 10/04/2017 revealed a high risk of late recurrence (5.4%; CI 2.5%-8.2%) during years 5-10.  There was a low likelihood of benefit from extended endocrine therapy.  She received Adriamycin and Cytoxan (AC) x 4 cycles followed by weekly Taxol x 10 (completed 06/05/2013).  She did not receive 2 cycles of Taxol secondary to progressive neuropathy.  She received bilateral breast radiation (07/2013 - 09/2013).  She began letrozole (Femara) in 09/2013.  Bilateral diagnostic mammogram on 03/22/2016 revealed  no evidence of recurrent disease.  Bilateral diagnostic mammogram on 03/31/2017 revealed no evidence of malignancy.  CA27.29 has been followed:  24.4 on 09/10/2016, 28.1 on 03/14/2017, 24.9 on 09/12/2017, and 22.8 on 03/13/2018.  Bone density on 03/21/2015 was normal with a T score of -0.5 in L1-L4 and -0.8 in the left femur.  She has a history of a borderline mild normocytic anemia.  Diet appears modest.  She denies any melena, hematochezia, hematuria or vaginal bleeding. Colonoscopy on 10/13/2015 was negative.    Soft tissue neck ultrasound on 03/16/2017 revealed no fluid collection or soft tissue lesion.  She has a new mild normocytic anemia.   Work-up on 09/12/2017 revealed the following normal studies: creatinine, ferritin (130), iron saturation (15%), TIBC, and folate.  B12 was 305 (low normal) on 01/02/2014.  She denies any hematochezia, hematuria or vaginal bleeding.  She describes one episode of black stool.  Diet is modest.  Colonoscopy on 10/13/2015 revealed one small polyp in the hepatic flexure, a diminutive polyp in the recto-sigmoid, and diverticulosis.  Symptomatically, she denies any breast concerns.  Exam is stable.  Hemoglobin is 11.4.  Plan: 1.  Labs today:  CBC with diff, CMP, CA27.29, B12,  folate. 2.  Bilateral breast cancer:  Discuss BCI test results:  5.4% risk of recurrence at years 5-10 with low likelihood of benefit.  Continue Femara through 09/2018 to complete 5 years of adjuvant endocrine therapy.  Follow-up mammogram 04/03/2018.  Discuss plan for yearly assessment after next visit. 3.  Health maintenance:  Bone density study.  Discuss calcium and vitamin D. 4.  Normocytic anemia:  Iron stores were adequate on 09/12/2017.  Check B12 as level was low normal in 2015. 5.  RTC in 6 months for MD assessment and labs (CBC with diff, CMP, CA27.29).  Addendum:  B12 was 199 today.  Patient contacted.  Oral B12 started.  Check B12 level in 1 month.   Lequita Asal, MD  03/13/2018, 3:54 PM

## 2018-03-13 NOTE — Progress Notes (Signed)
Patient offers no complaints today. 

## 2018-03-14 ENCOUNTER — Other Ambulatory Visit: Payer: Self-pay | Admitting: *Deleted

## 2018-03-14 ENCOUNTER — Ambulatory Visit (INDEPENDENT_AMBULATORY_CARE_PROVIDER_SITE_OTHER): Payer: Medicare Other

## 2018-03-14 ENCOUNTER — Telehealth: Payer: Self-pay | Admitting: *Deleted

## 2018-03-14 ENCOUNTER — Other Ambulatory Visit: Payer: Self-pay

## 2018-03-14 DIAGNOSIS — R0609 Other forms of dyspnea: Secondary | ICD-10-CM | POA: Diagnosis not present

## 2018-03-14 DIAGNOSIS — I428 Other cardiomyopathies: Secondary | ICD-10-CM

## 2018-03-14 DIAGNOSIS — E538 Deficiency of other specified B group vitamins: Secondary | ICD-10-CM

## 2018-03-14 LAB — VITAMIN B12: Vitamin B-12: 199 pg/mL (ref 180–914)

## 2018-03-14 LAB — CANCER ANTIGEN 27.29: CA 27.29: 22.8 U/mL (ref 0.0–38.6)

## 2018-03-14 NOTE — Telephone Encounter (Signed)
Called patient to inform her that B-12 levels are low.  MD recommends either B-12 injections or oral B-12 1000 mcg daily.  Patient states she prefers oral B-12.  She will pick up medication.  Will recheck level in one month.  Patient verbalized understanding.

## 2018-03-14 NOTE — Telephone Encounter (Signed)
-----   Message from Lequita Asal, MD sent at 03/14/2018  7:05 AM EDT ----- Regarding: Call patient  B12 is low.  Can begin B12 orally or injections in clinic.  If orally: B12 1000 mcg po q day.  If starts oral B12, need B12 level in 1 month.  M  ----- Message ----- From: Buel Ream, Lab [facility] [place] Sent: 03/13/2018   3:06 PM EDT To: Lequita Asal, MD

## 2018-03-15 ENCOUNTER — Telehealth: Payer: Self-pay | Admitting: *Deleted

## 2018-03-15 MED ORDER — FUROSEMIDE 20 MG PO TABS: 20.0000 mg | ORAL_TABLET | Freq: Every day | ORAL | 3 refills | Status: DC

## 2018-03-15 NOTE — Telephone Encounter (Signed)
-----   Message from Nelva Bush, MD sent at 03/15/2018  7:33 AM EDT ----- Please let Kelsey Mason know that her echocardiogram is abnormal with moderate reduction in her LVEF compared with her prior study (now 40-45%, previously 50-55%).  There is also moderate leakage of the mitral valve (previously mild to moderate).  He is finding could be contributing to some of her symptoms.  I recommend that she begin taking furosemide 20 mg daily rather than on an as-needed basis.  We should have her follow-up in 2 to 4 weeks with myself or an APP to reassess her symptoms and discuss catheterization to exclude underlying CAD.

## 2018-03-15 NOTE — Telephone Encounter (Signed)
Results called to pt. Pt verbalized understanding. Patient verbalized understanding to start furosemide 20 mg daily. New Rx sent to pharmacy. Patient scheduled to see Dr End on 04/14/18 and patient aware of appointment date and time. No further questions from patient.

## 2018-04-03 ENCOUNTER — Ambulatory Visit
Admission: RE | Admit: 2018-04-03 | Discharge: 2018-04-03 | Disposition: A | Discharge status: Home / Self Care | Payer: Medicare Other | Source: Ambulatory Visit | Attending: Urgent Care | Admitting: Urgent Care

## 2018-04-03 DIAGNOSIS — Z170 Estrogen receptor positive status [ER+]: Secondary | ICD-10-CM | POA: Insufficient documentation

## 2018-04-03 DIAGNOSIS — C50312 Malignant neoplasm of lower-inner quadrant of left female breast: Secondary | ICD-10-CM | POA: Diagnosis present

## 2018-04-07 ENCOUNTER — Other Ambulatory Visit: Payer: Self-pay | Admitting: Internal Medicine

## 2018-04-07 NOTE — Telephone Encounter (Signed)
Clarified dosage with patient she is taking Losartan 50 mg once a day.RX sent to [facility].

## 2018-04-08 ENCOUNTER — Other Ambulatory Visit: Payer: Self-pay | Admitting: Internal Medicine

## 2018-04-11 ENCOUNTER — Ambulatory Visit (INDEPENDENT_AMBULATORY_CARE_PROVIDER_SITE_OTHER): Payer: Medicare Other

## 2018-04-11 VITALS — BP 126/64 | HR 77 | Temp 98.3°F | Resp 15 | Ht 63.0 in | Wt 172.1 lb

## 2018-04-11 DIAGNOSIS — Z23 Encounter for immunization: Secondary | ICD-10-CM | POA: Diagnosis not present

## 2018-04-11 DIAGNOSIS — Z0000 Encounter for general adult medical examination without abnormal findings: Secondary | ICD-10-CM | POA: Diagnosis not present

## 2018-04-11 NOTE — Progress Notes (Signed)
Subjective:   Kelsey Mason is a 77 y.o. female who presents for Medicare Annual (Subsequent) preventive examination.  Review of Systems:  Cardiac Risk Factors include: advanced age (>13mn, >>100women);hypertension;diabetes mellitus No ROS.  Medicare Wellness Visit. Additional risk factors are reflected in the social history.     Objective:     Vitals: BP 126/64 (BP Location: Left Arm, Patient Position: Sitting, Cuff Size: Normal)   Pulse 77   Temp 98.3 F (36.8 C) (Oral)   Resp 15   Ht '5\' 3"'  (1.6 m)   Wt 172 lb 1.9 oz (78.1 kg)   SpO2 97%   BMI 30.49 kg/m   Body mass index is 30.49 kg/m.  Advanced Directives 04/11/2018 03/13/2018 09/12/2017 09/07/2017 04/08/2017 03/22/2017 03/14/2017  Does Patient Have a Medical Advance Directive? Yes Yes No No Yes Yes Yes  Type of AParamedicof ASaucierLiving will - - - Living will;Healthcare Power of Attorney - Living will  Does patient want to make changes to medical advance directive? No - Patient declined - - - - - -  Copy of HJordanin Chart? Yes - - - No - copy requested - -  Would patient like information on creating a medical advance directive? - - No - Patient declined No - Patient declined - - -    Tobacco Social History   Tobacco Use  Smoking Status Former Smoker  . Packs/day: 0.25  . Years: 1.00  . Pack years: 0.25  . Types: Cigarettes  . Last attempt to quit: 1963  . Years since quitting: 56.8  Smokeless Tobacco Never Used     Counseling given: Not Answered   Clinical Intake:  Pre-visit preparation completed: Yes  Pain : No/denies pain     Nutritional Status: BMI > 30  Obese Diabetes: Yes(Followed by Endocrinology)  How often do you need to have someone help you when you read instructions, pamphlets, or other written materials from your doctor or pharmacy?: 1 - Never  Interpreter Needed?: No     Past Medical History:  Diagnosis Date  . Breast symptom  2014  . Cancer ([facility] 2014   Bilateral Breast, chemo Dr. CJeb Levering . Diabetes mellitus   . GERD (gastroesophageal reflux disease)    mild  . Hypertension   . Personal history of chemotherapy   . Personal history of radiation therapy 2014   bilat lumpectomy   Past Surgical History:  Procedure Laterality Date  . ABDOMINAL HYSTERECTOMY  1976   for pelvic pain  . BREAST EXCISIONAL BIOPSY Bilateral 11/2012   bilat breast ca chemo and rad  . BREAST SURGERY Bilateral 2014   bilateral lumpectomy and SN biopsy  . CHOLECYSTECTOMY  2007  . COLONOSCOPY WITH PROPOFOL N/A 10/13/2015   Procedure: COLONOSCOPY WITH PROPOFOL;  Surgeon: RManya Silvas MD;  [facility];  Service: Endoscopy;  Laterality: N/A;  . OOPHORECTOMY Right   . PORTACATH PLACEMENT  2014  . TUMOR REMOVAL Right 1980   right foot   Family History  Problem Relation Age of Onset  . Alcohol abuse Mother   . Heart disease Mother   . Hypertension Mother   . Breast cancer Mother 648 . Alcohol abuse Father   . Heart disease Father   . Hypertension Father   . Heart disease Brother   . Heart attack Brother    Social History   Socioeconomic History  . Marital status: Widowed    Spouse name: Not  on file  . Number of children: Not on file  . Years of education: Not on file  . Highest education level: Not on file  Occupational History  . Not on file  Social Needs  . Financial resource strain: Not hard at all  . Food insecurity:    Worry: Not on file    Inability: Not on file  . Transportation needs:    Medical: No    Non-medical: No  Tobacco Use  . Smoking status: Former Smoker    Packs/day: 0.25    Years: 1.00    Pack years: 0.25    Types: Cigarettes    Last attempt to quit: 1963    Years since quitting: 56.8  . Smokeless tobacco: Never Used  Substance and Sexual Activity  . Alcohol use: No    Alcohol/week: 0.0 standard drinks  . Drug use: No  . Sexual activity: Never  Lifestyle  . Physical  activity:    Days per week: 2 days    Minutes per session: 150+ min  . Stress: Not at all  Relationships  . Social connections:    Talks on phone: Not on file    Gets together: Not on file    Attends religious service: Not on file    Active member of club or organization: Not on file    Attends meetings of clubs or organizations: Not on file    Relationship status: Not on file  Other Topics Concern  . Not on file  Social History Narrative   Lives in [place].       Works - [employer]   Diet - regular   Exercise - dance 3x per week    Outpatient Encounter Medications as of 04/11/2018  Medication Sig  . aspirin 81 MG tablet Take 81 mg by mouth daily.  . blood glucose meter kit and supplies KIT Dispense based on patient and insurance preference. Use up to four times daily as directed. One Touch Ultra mini meter (FOR ICD-9 250.00, 250.01). Dx: E11.9  . furosemide (LASIX) 20 MG tablet Take 1 tablet (20 mg total) by mouth daily.  [name] glimepiride (AMARYL) 2 MG tablet TAKE 1 TABLET (2 MG TOTAL) BY MOUTH 2 (TWO) TIMES DAILY.  [name] glucose blood (ONE TOUCH ULTRA TEST) test strip USE TO TEST BLOOD SUGAR UP TO 4 TIMES DAILY  . LANTUS SOLOSTAR 100 UNIT/ML Solostar Pen INJECT 20 UNITS INTO THE SKIN DAILY AT 10 PM. (Patient taking differently: Inject 40 Units into the skin daily at 10 pm. )  . letrozole (FEMARA) 2.5 MG tablet TAKE 1 TABLET BY MOUTH EVERY DAY  . losartan (COZAAR) 50 MG tablet Take 50 mg by mouth 2 (two) times daily.  [name] losartan (COZAAR) 50 MG tablet TAKE 1 TABLET BY MOUTH EVERY DAY  . losartan (COZAAR) 50 MG tablet TAKE 1 TABLET BY MOUTH EVERY DAY  . NOVOFINE 32G X 6 MM MISC USE AS DIRECTED WITH LANTUS PEN  . omeprazole (PRILOSEC) 20 MG capsule Take 1 capsule (20 mg total) by mouth daily.  . TRULICITY 1.5 BO/1.7PZ SOPN Inject 1.5 mg as directed once a week.  . vitamin B-12 (CYANOCOBALAMIN) 1000 MCG tablet Take 1,000 mcg by mouth daily.  . carvedilol (COREG) 12.5 MG tablet Take  1 tablet (12.5 mg total) by mouth 2 (two) times daily.  . rosuvastatin (CRESTOR) 5 MG tablet Take 1 tablet (5 mg total) by mouth daily.   No facility-administered encounter medications on file as of 04/11/2018.  Activities of Daily Living In your present state of health, do you have any difficulty performing the following activities: 04/11/2018  Hearing? N  Vision? N  Difficulty concentrating or making decisions? N  Walking or climbing stairs? N  Dressing or bathing? N  Doing errands, shopping? N  Preparing Food and eating ? N  Using the Toilet? N  In the past six months, have you accidently leaked urine? N  Do you have problems with loss of bowel control? N  Managing your Medications? N  Managing your Finances? N  Housekeeping or managing your Housekeeping? N  Some recent data might be hidden    Patient Care Team: Leone Haven, MD as PCP - General (Family Medicine) Christene Lye, MD as Consulting Physician (General Surgery) Gabriel Carina Betsey Holiday, MD as Physician Assistant (Endocrinology)    Assessment:   This is a routine wellness examination for Evaleigh.  The goal of the wellness visit is to assist the patient how to close the gaps in care and create a preventative care plan for the patient.   The roster of all physicians providing medical care to patient is listed in the Snapshot section of the chart.  Osteoporosis risk reviewed.    Safety issues reviewed; Smoke and carbon monoxide detectors in the home. No firearms in the home. Wears seatbelts when driving or riding with others. No violence in the home.  They do not have excessive sun exposure.  Discussed the need for sun protection: hats, long sleeves and the use of sunscreen if there is significant sun exposure.  No failures at ADL's or IADL's.    BMI- discussed the importance of a healthy diet, water intake and the benefits of aerobic exercise.    24 hour diet recall: Regular diet. Plans to start a  low carb diet.   Dental- UTD  Sleep patterns- Sleep is fair.   High dose influenza administered L deltoid, tolerated well.  No verbal complaint during or post administration. Educational material provided.  Patient Concerns: None at this time. Follow up with PCP as needed.  Exercise Activities and Dietary recommendations Current Exercise Habits: Home exercise routine, Type of exercise: calisthenics, Time (Minutes): > 60, Frequency (Times/Week): 2, Weekly Exercise (Minutes/Week): 0, Intensity: Moderate  Goals      Patient Stated   . Low Carb Diet (pt-stated)     Lose weight       Fall Risk Fall Risk  04/11/2018 09/07/2017 04/08/2017 09/08/2016 01/30/2016  Falls in the past year? No No Yes No No  Number falls in past yr: - - 1 - -  Injury with Fall? - - No - -   Depression Screen PHQ 2/9 Scores 04/11/2018 09/07/2017 04/08/2017 09/08/2016  PHQ - 2 Score 0 0 0 0  PHQ- 9 Score - - 0 -     Cognitive Function MMSE - Mini Mental State Exam 04/11/2018 04/08/2017 01/30/2016  Orientation to time '3 3 5  ' Orientation to time comments Asked to draw a clock face of time 11:10. Time shown 11:50. difficulty reading the face of a clock correctly -  Orientation to Place '5 5 5  ' Registration '3 3 3  ' Attention/ Calculation '3 3 5  ' Attention/Calculation-comments Asked to subtract by 3's Difficulty performing simple calculations -  Recall '3 3 3  ' Language- name 2 objects '2 2 2  ' Language- repeat '1 1 1  ' Language- follow 3 step command '3 3 3  ' Language- read & follow direction '1 1 1  ' Write a  sentence '1 1 1  ' Copy design '1 1 1  ' Total score '26 26 30        ' Immunization History  Administered Date(s) Administered  . Influenza Whole 03/26/2012, 04/10/2013  . Influenza, High Dose Seasonal PF 06/03/2016, 03/24/2017, 04/11/2018  . Influenza,inj,Quad PF,6+ Mos 05/15/2014, 03/25/2015  . Influenza-Unspecified 05/15/2014, 03/25/2015   Screening Tests Health Maintenance  Topic Date Due  . OPHTHALMOLOGY EXAM   03/30/2018  . TETANUS/TDAP  12/28/2020 (Originally 03/13/1960)  . HEMOGLOBIN A1C  07/07/2018  . FOOT EXAM  01/24/2019  . INFLUENZA VACCINE  Completed  . DEXA SCAN  Addressed  . PNA vac Low Risk Adult  Discontinued      Plan:    End of life planning; Advance aging; Advanced directives discussed. Copy of current HCPOA/Living Will on file.    I have personally reviewed and noted the following in the patient's chart:   . Medical and social history . Use of alcohol, tobacco or illicit drugs  . Current medications and supplements . Functional ability and status . Nutritional status . Physical activity . Advanced directives . List of other physicians . Hospitalizations, surgeries, and ER visits in previous 12 months . Vitals . Screenings to include cognitive, depression, and falls . Referrals and appointments  In addition, I have reviewed and discussed with patient certain preventive protocols, quality metrics, and best practice recommendations. A written personalized care plan for preventive services as well as general preventive health recommendations were provided to patient.     Varney Biles, LPN  [id number]

## 2018-04-11 NOTE — Patient Instructions (Addendum)
  Kelsey Mason , Thank you for taking time to come for your Medicare Wellness Visit. I appreciate your ongoing commitment to your health goals. Please review the following plan we discussed and let me know if I can assist you in the future.   These are the goals we discussed: Goals      Patient Stated   . Low Carb Diet (pt-stated)     Lose weight       This is a list of the screening recommended for you and due dates:  Health Maintenance  Topic Date Due  . Eye exam for diabetics  03/30/2018  . Tetanus Vaccine  12/28/2020*  . Hemoglobin A1C  07/07/2018  . Complete foot exam   01/24/2019  . Flu Shot  Completed  . DEXA scan (bone density measurement)  Addressed  . Pneumonia vaccines  Discontinued  *Topic was postponed. The date shown is not the original due date.

## 2018-04-13 ENCOUNTER — Inpatient Hospital Stay: Payer: Medicare Other | Attending: Hematology and Oncology

## 2018-04-13 DIAGNOSIS — Z9221 Personal history of antineoplastic chemotherapy: Secondary | ICD-10-CM | POA: Insufficient documentation

## 2018-04-13 DIAGNOSIS — C50912 Malignant neoplasm of unspecified site of left female breast: Secondary | ICD-10-CM | POA: Insufficient documentation

## 2018-04-13 DIAGNOSIS — C50911 Malignant neoplasm of unspecified site of right female breast: Secondary | ICD-10-CM | POA: Diagnosis not present

## 2018-04-13 DIAGNOSIS — Z170 Estrogen receptor positive status [ER+]: Secondary | ICD-10-CM | POA: Diagnosis not present

## 2018-04-13 DIAGNOSIS — E538 Deficiency of other specified B group vitamins: Secondary | ICD-10-CM

## 2018-04-13 DIAGNOSIS — Z923 Personal history of irradiation: Secondary | ICD-10-CM | POA: Diagnosis not present

## 2018-04-13 DIAGNOSIS — Z79811 Long term (current) use of aromatase inhibitors: Secondary | ICD-10-CM | POA: Diagnosis not present

## 2018-04-13 LAB — VITAMIN B12: Vitamin B-12: 1142 pg/mL — ABNORMAL HIGH (ref 180–914)

## 2018-04-13 NOTE — Progress Notes (Signed)
I have reviewed the above note and agree.  Taro Hidrogo, M.D.  

## 2018-04-14 ENCOUNTER — Telehealth: Payer: Self-pay | Admitting: Internal Medicine

## 2018-04-14 ENCOUNTER — Encounter: Payer: Self-pay | Admitting: *Deleted

## 2018-04-14 ENCOUNTER — Ambulatory Visit: Payer: Medicare Other | Admitting: Internal Medicine

## 2018-04-14 ENCOUNTER — Encounter: Payer: Self-pay | Admitting: Internal Medicine

## 2018-04-14 VITALS — BP 140/76 | HR 78 | Ht 63.0 in | Wt 172.1 lb

## 2018-04-14 DIAGNOSIS — I10 Essential (primary) hypertension: Secondary | ICD-10-CM | POA: Diagnosis not present

## 2018-04-14 DIAGNOSIS — Z01810 Encounter for preprocedural cardiovascular examination: Secondary | ICD-10-CM

## 2018-04-14 DIAGNOSIS — I739 Peripheral vascular disease, unspecified: Secondary | ICD-10-CM

## 2018-04-14 DIAGNOSIS — I5022 Chronic systolic (congestive) heart failure: Secondary | ICD-10-CM | POA: Diagnosis not present

## 2018-04-14 NOTE — Progress Notes (Signed)
 Follow-up Outpatient Visit Date: 04/14/2018  Primary Care Provider: Sonnenberg, Eric G, MD [address] [place] [address]  Chief Complaint: Follow-up heart failure  HPI:  Kelsey Mason is a 77 y.o. year-old female with history of nonischemic cardiomyopathy with normalization of LVEF, mitral regurgitation, hypertension, hyperlipidemia, type 2 diabetes mellitus,mild-moderate renal artery stenosis,and breast cancer, who presents for follow-up of cardiomyopathy.  I last saw her in late August for evaluation of leg pain and abnormal ABI's.  Calf discomfort was noted to be chronic and ABI's consistent with medial calcification without significant stenosis.  Due to exertional dyspnea, we agreed to obtain an echo which showed interval reduction in LVEF from 50-55% to 40-45%.  Based on this result, I recommended adding standing furosemide 20 mg daily.  Today, Kelsey Mason reports that her exertional dyspnea has improved a little, though she continues to get short of breath with modest activity.  She is also still bothered by pain in both calves, particularly when line dancing.  The pain resolves quickly with rest.  She denies chest pain, palpitations, lightheadedness, and orthopnea.  She has occasional dependent leg edema, which is stable.  --------------------------------------------------------------------------------------------------  Cardiovascular History & Procedures: Cardiovascular Problems:  Non-ischemic cardiomyopathy  Mitral regurgitation  Claudication  Risk Factors:  HTN, HLD, DM2, and age > 65  Cath/PCI:  None  CV Surgery:  None  EP Procedures and Devices:  None  Non-Invasive Evaluation(s):  TTE (03/14/18): Mildly dilated LV with LVEF 40-45%.  Global hypokinesis with grade 2 diastolic dysfunction.  Moderate MR and mild LA enlargement.  Normal RV size and function.  ABIs (02/22/2018): Noncompressible runoff vessels bilaterally with triphasic  waveforms.  TBI's are normal bilaterally.  Renal artery Doppler (04/20/17): Mild to moderate (less than 60%) stenosis of the right renal artery. No significant left renal artery disease.  TTE (01/14/17): Normal LV size with LVEF 50-55%. Anteroseptal hypokinesis is noted, as well as grade 1 diastolic dysfunction. Mild to moderate MR. Normal RV size and function. Mild pulmonary hypertension.  Pharmacologic myocardial perfusion stress test (06/16/16): Low risk study with moderate in size, moderate in severity, fixed defect involving the septum most likely related to LBBB. No evidence of ischemia. LVEF 50% by automated calculation and likely higher based on visual estimation.  ABIs (06/10/16): ABIs: Right not obtainable, left 1.4. TBIs right 1.1, left not obtainable. Bilateral great toe PPG's are normal. Bilateral common femoral, popliteal, peroneal, anterior tibial, and posterior tibial artery waveforms are brisk and triphasic.  TTE (02/23/16, [facility]): Mildly dilated left ventricle with mild to moderate LV dysfunction (EF 35-45%). Grade 1 diastolic dysfunction. Moderate mitral regurgitation. Mild left atrial enlargement. Normal RV function.  TTE (01/01/14, [facility]): Moderate to severe LV dysfunction (EF 30%) with mild LVH and mild left ventricular dilation. Mitral annular calcification with moderate MR and moderate TR noted. Normal right ventricular contraction.  MUGA (12/25/12): Normal contraction and wall motion. EF 63%.  Recent CV Pertinent Labs: Lab Results  Component Value Date   CHOL 134 11/25/2017   HDL 45 11/25/2017   LDLCALC 70 11/25/2017   LDLDIRECT 178.7 10/02/2012   TRIG 96 11/25/2017   CHOLHDL 3.0 11/25/2017   K 4.2 03/13/2018   K 4.7 01/07/2014   BUN 19 03/13/2018   BUN 19 05/30/2017   BUN 19 (H) 01/07/2014   CREATININE 1.23 (H) 03/13/2018   CREATININE 1.24 01/07/2014    Past medical and surgical history were reviewed and updated in EPIC.  Current Meds      Medication Sig  . aspirin 81 MG tablet Take 81 mg by mouth daily.  . blood glucose meter kit and supplies KIT Dispense based on patient and insurance preference. Use up to four times daily as directed. One Touch Ultra mini meter (FOR ICD-9 250.00, 250.01). Dx: E11.9  . furosemide (LASIX) 20 MG tablet Take 1 tablet (20 mg total) by mouth daily.  [name] glimepiride (AMARYL) 2 MG tablet TAKE 1 TABLET (2 MG TOTAL) BY MOUTH 2 (TWO) TIMES DAILY. (Patient taking differently: Take 2 mg by mouth 2 (two) times daily. TAKE 1 TABLET (2 MG TOTAL) BY MOUTH 2 (TWO) TIMES DAILY.)  . glucose blood (ONE TOUCH ULTRA TEST) test strip USE TO TEST BLOOD SUGAR UP TO 4 TIMES DAILY  . LANTUS SOLOSTAR 100 UNIT/ML Solostar Pen INJECT 20 UNITS INTO THE SKIN DAILY AT 10 PM. (Patient taking differently: Inject 44 Units into the skin at bedtime. )  . letrozole (FEMARA) 2.5 MG tablet TAKE 1 TABLET BY MOUTH EVERY DAY  . losartan (COZAAR) 50 MG tablet Take 50 mg by mouth 2 (two) times daily.  [name] NOVOFINE 32G X 6 MM MISC USE AS DIRECTED WITH LANTUS PEN  . omeprazole (PRILOSEC) 20 MG capsule Take 1 capsule (20 mg total) by mouth daily.  . TRULICITY 1.5 QP/6.1PJ SOPN Inject 1.5 mg as directed once a week.  . vitamin B-12 (CYANOCOBALAMIN) 1000 MCG tablet Take 1,000 mcg by mouth daily.    Allergies: Advil [ibuprofen]; Aleve [naproxen]; Fluticasone-salmeterol; Lipitor [atorvastatin]; Naproxen sodium; and Penicillins  Social History   Tobacco Use  . Smoking status: Former Smoker    Packs/day: 0.25    Years: 1.00    Pack years: 0.25    Types: Cigarettes    Last attempt to quit: 1963    Years since quitting: 56.8  . Smokeless tobacco: Never Used  Substance Use Topics  . Alcohol use: No    Alcohol/week: 0.0 standard drinks  . Drug use: No    Family History  Problem Relation Age of Onset  . Alcohol abuse Mother   . Heart disease Mother   . Hypertension Mother   . Breast cancer Mother 43  . Alcohol abuse Father   . Heart  disease Father   . Hypertension Father   . Heart disease Brother   . Heart attack Brother     Review of Systems: A 12-system review of systems was performed and was negative except as noted in the HPI.  --------------------------------------------------------------------------------------------------  Physical Exam: BP 140/76   Pulse 78   Ht 5' 3" (1.6 m)   Wt 172 lb 1.9 oz (78.1 kg)   SpO2 98%   BMI 30.49 kg/m   General:  NAD HEENT: No conjunctival pallor or scleral icterus. Moist mucous membranes.  OP clear. Neck: Supple without lymphadenopathy, thyromegaly, JVD, or HJR. No carotid bruit. Lungs: Normal work of breathing. Clear to auscultation bilaterally without wheezes or crackles. Heart: RRR with 1/6 systolic murmur.  No rubs or gallops.. Abd: Bowel sounds present. Soft, NT/ND without hepatosplenomegaly Ext: Trace pretibial edema bilaterally. 2+ radial and pedal pulses bilaterally Skin: Warm and dry without rash.  EKG:  NSR with LBBB, unchanged.  Lab Results  Component Value Date   WBC 8.9 03/13/2018   HGB 11.4 (L) 03/13/2018   HCT 34.1 (L) 03/13/2018   MCV 89.2 03/13/2018   PLT 191 03/13/2018    Lab Results  Component Value Date   NA 134 (L) 03/13/2018   K 4.2 03/13/2018  CL 102 03/13/2018   CO2 25 03/13/2018   BUN 19 03/13/2018   CREATININE 1.23 (H) 03/13/2018   GLUCOSE 126 (H) 03/13/2018   ALT 16 03/13/2018    Lab Results  Component Value Date   CHOL 134 11/25/2017   HDL 45 11/25/2017   LDLCALC 70 11/25/2017   LDLDIRECT 178.7 10/02/2012   TRIG 96 11/25/2017   CHOLHDL 3.0 11/25/2017    --------------------------------------------------------------------------------------------------  ASSESSMENT AND PLAN: Chronic systolic heart failure Ms. Mathies notes modest improvement with addition of daily furosemide but continues to have dyspnea with exertion, consistent with NYHA class II-III HF.  LVEF declined to 40-45% on most recent echo.  Prior stress  test was low risk with a septal defect felt to be due to LBBB.  However, given interval decline in LVEF and continued DOE with multiple cardiac risk factors, I think it would be prudent to proceed with left heart catheterization.  I have reviewed the risks, indications, and alternatives to cardiac catheterization, possible angioplasty, and stenting with the patient. Risks include but are not limited to bleeding, infection, vascular injury, stroke, myocardial infection, arrhythmia, kidney injury, radiation-related injury in the case of prolonged fluoroscopy use, emergency cardiac surgery, and death. The patient understands the risks of serious complication is 1-2 in 1000 with diagnostic cardiac cath and 1-2% or less with angioplasty/stenting.  We will not make any medication changes today.  Claudication Bilateral calf pain is suspicious for claudication, though ABI's x 2 have suggested reasonable perfusion with medial calcification.  As we are planning to perform LHC and the patient has continued symptoms with multiple risk factors for cardiovascular disease, we have agreed to perform an abdominal aortogram and runoff at the time of catheterization.  The risks and benefits of the procedure were discussed with Kelsey Mason, who wishes to proceed.  Hypertension Blood pressure is mildly elevated today.  We will defer medication changes until after upcoming catheterization/angiography.  Follow-up: Return to clinic in ~1 month.  Dauna Ziska, MD 04/14/2018 2:53 PM  

## 2018-04-14 NOTE — H&P (View-Only) (Signed)
Follow-up Outpatient Visit Date: 04/14/2018  Primary Care Provider: Leone Haven, MD [address] [place] [address]  Chief Complaint: Follow-up heart failure  HPI:  Kelsey Mason is a 77 y.o. year-old female with history of nonischemic cardiomyopathy with normalization of LVEF, mitral regurgitation, hypertension, hyperlipidemia, type 2 diabetes mellitus,mild-moderate renal artery stenosis,and breast cancer, who presents for follow-up of cardiomyopathy.  I last saw her in late August for evaluation of leg pain and abnormal ABI's.  Calf discomfort was noted to be chronic and ABI's consistent with medial calcification without significant stenosis.  Due to exertional dyspnea, we agreed to obtain an echo which showed interval reduction in LVEF from 50-55% to 40-45%.  Based on this result, I recommended adding standing furosemide 20 mg daily.  Today, Kelsey Mason reports that her exertional dyspnea has improved a little, though she continues to get short of breath with modest activity.  She is also still bothered by pain in both calves, particularly when line dancing.  The pain resolves quickly with rest.  She denies chest pain, palpitations, lightheadedness, and orthopnea.  She has occasional dependent leg edema, which is stable.  --------------------------------------------------------------------------------------------------  Cardiovascular History & Procedures: Cardiovascular Problems:  Non-ischemic cardiomyopathy  Mitral regurgitation  Claudication  Risk Factors:  HTN, HLD, DM2, and age > 68  Cath/PCI:  None  CV Surgery:  None  EP Procedures and Devices:  None  Non-Invasive Evaluation(s):  TTE (03/14/18): Mildly dilated LV with LVEF 40-45%.  Global hypokinesis with grade 2 diastolic dysfunction.  Moderate MR and mild LA enlargement.  Normal RV size and function.  ABIs (02/22/2018): Noncompressible runoff vessels bilaterally with triphasic  waveforms.  TBI's are normal bilaterally.  Renal artery Doppler (04/20/17): Mild to moderate (less than 60%) stenosis of the right renal artery. No significant left renal artery disease.  TTE (01/14/17): Normal LV size with LVEF 50-55%. Anteroseptal hypokinesis is noted, as well as grade 1 diastolic dysfunction. Mild to moderate MR. Normal RV size and function. Mild pulmonary hypertension.  Pharmacologic myocardial perfusion stress test (06/16/16): Low risk study with moderate in size, moderate in severity, fixed defect involving the septum most likely related to LBBB. No evidence of ischemia. LVEF 50% by automated calculation and likely higher based on visual estimation.  ABIs (06/10/16): ABIs: Right not obtainable, left 1.4. TBIs right 1.1, left not obtainable. Bilateral great toe PPG's are normal. Bilateral common femoral, popliteal, peroneal, anterior tibial, and posterior tibial artery waveforms are brisk and triphasic.  TTE (02/23/16, [facility]): Mildly dilated left ventricle with mild to moderate LV dysfunction (EF 35-45%). Grade 1 diastolic dysfunction. Moderate mitral regurgitation. Mild left atrial enlargement. Normal RV function.  TTE (01/01/14, [facility]): Moderate to severe LV dysfunction (EF 30%) with mild LVH and mild left ventricular dilation. Mitral annular calcification with moderate MR and moderate TR noted. Normal right ventricular contraction.  MUGA (12/25/12): Normal contraction and wall motion. EF 63%.  Recent CV Pertinent Labs: Lab Results  Component Value Date   CHOL 134 11/25/2017   HDL 45 11/25/2017   LDLCALC 70 11/25/2017   LDLDIRECT 178.7 10/02/2012   TRIG 96 11/25/2017   CHOLHDL 3.0 11/25/2017   K 4.2 03/13/2018   K 4.7 01/07/2014   BUN 19 03/13/2018   BUN 19 05/30/2017   BUN 19 (H) 01/07/2014   CREATININE 1.23 (H) 03/13/2018   CREATININE 1.24 01/07/2014    Past medical and surgical history were reviewed and updated in EPIC.  Current Meds  Medication Sig  . aspirin 81 MG tablet Take 81 mg by mouth daily.  . blood glucose meter kit and supplies KIT Dispense based on patient and insurance preference. Use up to four times daily as directed. One Touch Ultra mini meter (FOR ICD-9 250.00, 250.01). Dx: E11.9  . furosemide (LASIX) 20 MG tablet Take 1 tablet (20 mg total) by mouth daily.  [name] glimepiride (AMARYL) 2 MG tablet TAKE 1 TABLET (2 MG TOTAL) BY MOUTH 2 (TWO) TIMES DAILY. (Patient taking differently: Take 2 mg by mouth 2 (two) times daily. TAKE 1 TABLET (2 MG TOTAL) BY MOUTH 2 (TWO) TIMES DAILY.)  . glucose blood (ONE TOUCH ULTRA TEST) test strip USE TO TEST BLOOD SUGAR UP TO 4 TIMES DAILY  . LANTUS SOLOSTAR 100 UNIT/ML Solostar Pen INJECT 20 UNITS INTO THE SKIN DAILY AT 10 PM. (Patient taking differently: Inject 44 Units into the skin at bedtime. )  . letrozole (FEMARA) 2.5 MG tablet TAKE 1 TABLET BY MOUTH EVERY DAY  . losartan (COZAAR) 50 MG tablet Take 50 mg by mouth 2 (two) times daily.  [name] NOVOFINE 32G X 6 MM MISC USE AS DIRECTED WITH LANTUS PEN  . omeprazole (PRILOSEC) 20 MG capsule Take 1 capsule (20 mg total) by mouth daily.  . TRULICITY 1.5 QP/6.1PJ SOPN Inject 1.5 mg as directed once a week.  . vitamin B-12 (CYANOCOBALAMIN) 1000 MCG tablet Take 1,000 mcg by mouth daily.    Allergies: Advil [ibuprofen]; Aleve [naproxen]; Fluticasone-salmeterol; Lipitor [atorvastatin]; Naproxen sodium; and Penicillins  Social History   Tobacco Use  . Smoking status: Former Smoker    Packs/day: 0.25    Years: 1.00    Pack years: 0.25    Types: Cigarettes    Last attempt to quit: 1963    Years since quitting: 56.8  . Smokeless tobacco: Never Used  Substance Use Topics  . Alcohol use: No    Alcohol/week: 0.0 standard drinks  . Drug use: No    Family History  Problem Relation Age of Onset  . Alcohol abuse Mother   . Heart disease Mother   . Hypertension Mother   . Breast cancer Mother 50  . Alcohol abuse Father   . Heart  disease Father   . Hypertension Father   . Heart disease Brother   . Heart attack Brother     Review of Systems: A 12-system review of systems was performed and was negative except as noted in the HPI.  --------------------------------------------------------------------------------------------------  Physical Exam: BP 140/76   Pulse 78   Ht 5' 3" (1.6 m)   Wt 172 lb 1.9 oz (78.1 kg)   SpO2 98%   BMI 30.49 kg/m   General:  NAD HEENT: No conjunctival pallor or scleral icterus. Moist mucous membranes.  OP clear. Neck: Supple without lymphadenopathy, thyromegaly, JVD, or HJR. No carotid bruit. Lungs: Normal work of breathing. Clear to auscultation bilaterally without wheezes or crackles. Heart: RRR with 1/6 systolic murmur.  No rubs or gallops.. Abd: Bowel sounds present. Soft, NT/ND without hepatosplenomegaly Ext: Trace pretibial edema bilaterally. 2+ radial and pedal pulses bilaterally Skin: Warm and dry without rash.  EKG:  NSR with LBBB, unchanged.  Lab Results  Component Value Date   WBC 8.9 03/13/2018   HGB 11.4 (L) 03/13/2018   HCT 34.1 (L) 03/13/2018   MCV 89.2 03/13/2018   PLT 191 03/13/2018    Lab Results  Component Value Date   NA 134 (L) 03/13/2018   K 4.2 03/13/2018  CL 102 03/13/2018   CO2 25 03/13/2018   BUN 19 03/13/2018   CREATININE 1.23 (H) 03/13/2018   GLUCOSE 126 (H) 03/13/2018   ALT 16 03/13/2018    Lab Results  Component Value Date   CHOL 134 11/25/2017   HDL 45 11/25/2017   LDLCALC 70 11/25/2017   LDLDIRECT 178.7 10/02/2012   TRIG 96 11/25/2017   CHOLHDL 3.0 11/25/2017    --------------------------------------------------------------------------------------------------  ASSESSMENT AND PLAN: Chronic systolic heart failure Kelsey Mason notes modest improvement with addition of daily furosemide but continues to have dyspnea with exertion, consistent with NYHA class II-III HF.  LVEF declined to 40-45% on most recent echo.  Prior stress  test was low risk with a septal defect felt to be due to LBBB.  However, given interval decline in LVEF and continued DOE with multiple cardiac risk factors, I think it would be prudent to proceed with left heart catheterization.  I have reviewed the risks, indications, and alternatives to cardiac catheterization, possible angioplasty, and stenting with the patient. Risks include but are not limited to bleeding, infection, vascular injury, stroke, myocardial infection, arrhythmia, kidney injury, radiation-related injury in the case of prolonged fluoroscopy use, emergency cardiac surgery, and death. The patient understands the risks of serious complication is 1-2 in 1583 with diagnostic cardiac cath and 1-2% or less with angioplasty/stenting.  We will not make any medication changes today.  Claudication Bilateral calf pain is suspicious for claudication, though ABI's x 2 have suggested reasonable perfusion with medial calcification.  As we are planning to perform LHC and the patient has continued symptoms with multiple risk factors for cardiovascular disease, we have agreed to perform an abdominal aortogram and runoff at the time of catheterization.  The risks and benefits of the procedure were discussed with Kelsey Mason, who wishes to proceed.  Hypertension Blood pressure is mildly elevated today.  We will defer medication changes until after upcoming catheterization/angiography.  Follow-up: Return to clinic in ~1 month.  Nelva Bush, MD 04/14/2018 2:53 PM

## 2018-04-14 NOTE — Patient Instructions (Signed)
Medication Instructions:  Your physician recommends that you continue on your current medications as directed. Please refer to the Current Medication list given to you today.  If you need a refill on your cardiac medications before your next appointment, please call your pharmacy.   Lab work: Your physician recommends that you return for lab work in: TODAY - [place], BMET.  If you have labs (blood work) drawn today and your tests are completely normal, you will receive your results only by: [name] MyChart Message (if you have MyChart) OR . A paper copy in the mail If you have any lab test that is abnormal or we need to change your treatment, we will call you to review the results.  Testing/Procedures: Your physician has requested that you have a LEFT cardiac catheterization with abdominal aortagram. Cardiac catheterization is used to diagnose and/or treat various heart conditions. Doctors may recommend this procedure for a number of different reasons. The most common reason is to evaluate chest pain. Chest pain can be a symptom of coronary artery disease (CAD), and cardiac catheterization can show whether plaque is narrowing or blocking your heart's arteries. This procedure is also used to evaluate the valves, as well as measure the blood flow and oxygen levels in different parts of your heart. For further information please visit [email or web address]. Please follow instruction sheet, as given.  See Letter with pre-procedural instructions.    Follow-Up: At [facility], you and your health needs are our priority.  As part of our continuing mission to provide you with exceptional heart care, we have created designated Provider Care Teams.  These Care Teams include your primary Cardiologist (physician) and Advanced Practice Providers (APPs -  Physician Assistants and Nurse Practitioners) who all work together to provide you with the care you need, when you need it. You will need a follow up appointment  in 3-4 weeks after the procedure.  Please call our office 2 months in advance to schedule this appointment.  You may see Dr Harrell Gave End or one of the following Advanced Practice Providers on your designated Care Team:   Murray Hodgkins, NP Christell Faith, PA-C . Marrianne Mood, PA-C

## 2018-04-14 NOTE — Telephone Encounter (Signed)
Spoke with the pt and she wanted to clarify that her time to arrive on her sheet was for 11:30am and not the procedure time.Marland Kitchenshe was reassured that her arrival time is 11:30am and reminded her to be fasting and she verbalized understanding.

## 2018-04-14 NOTE — Telephone Encounter (Signed)
Please call regarding her catherization appointment for next Friday.

## 2018-04-15 LAB — BASIC METABOLIC PANEL
BUN/Creatinine Ratio: 15 (ref 12–28)
BUN: 20 mg/dL (ref 8–27)
CALCIUM: 9.4 mg/dL (ref 8.7–10.3)
CO2: 23 mmol/L (ref 20–29)
Chloride: 101 mmol/L (ref 96–106)
Creatinine, Ser: 1.3 mg/dL — ABNORMAL HIGH (ref 0.57–1.00)
GFR calc Af Amer: 46 mL/min/{1.73_m2} — ABNORMAL LOW (ref >59)
GFR, EST NON AFRICAN AMERICAN: 40 mL/min/{1.73_m2} — AB (ref >59)
GLUCOSE: 98 mg/dL (ref 65–99)
Potassium: 4.7 mmol/L (ref 3.5–5.2)
Sodium: 140 mmol/L (ref 134–144)

## 2018-04-15 LAB — CBC WITH DIFFERENTIAL/PLATELET
BASOS ABS: 0.1 10*3/uL (ref 0.0–0.2)
Basos: 1 %
EOS (ABSOLUTE): 1.1 10*3/uL — ABNORMAL HIGH (ref 0.0–0.4)
EOS: 11 %
HEMATOCRIT: 35.7 % (ref 34.0–46.6)
Hemoglobin: 12 g/dL (ref 11.1–15.9)
IMMATURE GRANS (ABS): 0.1 10*3/uL (ref 0.0–0.1)
Immature Granulocytes: 1 %
LYMPHS ABS: 1.1 10*3/uL (ref 0.7–3.1)
LYMPHS: 12 %
MCH: 29.6 pg (ref 26.6–33.0)
MCHC: 33.6 g/dL (ref 31.5–35.7)
MCV: 88 fL (ref 79–97)
MONOCYTES: 7 %
Monocytes Absolute: 0.6 10*3/uL (ref 0.1–0.9)
Neutrophils Absolute: 6.5 10*3/uL (ref 1.4–7.0)
Neutrophils: 68 %
Platelets: 209 10*3/uL (ref 150–450)
RBC: 4.06 x10E6/uL (ref 3.77–5.28)
RDW: 13 % (ref 12.3–15.4)
WBC: 9.4 10*3/uL (ref 3.4–10.8)

## 2018-04-17 ENCOUNTER — Telehealth: Payer: Self-pay | Admitting: *Deleted

## 2018-04-17 NOTE — Telephone Encounter (Signed)
Results called to pt. Pt verbalized understanding of results and to not take her furosemide the day before and morning of. She is aware to arrive at [facility] at 8:30 am.  Cath lab, Santiago Glad, notified of prehydration.   Dr End, Do you want patient to hold her losartan as well per protocol?

## 2018-04-17 NOTE — Telephone Encounter (Signed)
Sure, we can hold losartan the day of the procedure as well.  Nelva Bush, MD [facility] Pager: [phone]

## 2018-04-17 NOTE — Telephone Encounter (Signed)
-----   Message from Nelva Bush, MD sent at 04/17/2018 10:42 AM EDT ----- Labs at baseline.  Renal function moderately decreased.  I recommend that Ms. Gunner hold her furosemide the day before and morning of her catheterization.  She should also come in about 4 hours early for prehydration, given GFR of 40.

## 2018-04-18 NOTE — Telephone Encounter (Signed)
Patient verbalized understanding to hold losartan as well the morning of the procedure.

## 2018-04-20 ENCOUNTER — Telehealth: Payer: Self-pay

## 2018-04-20 NOTE — Telephone Encounter (Signed)
Patient contacted pre-catheterization at [facility] scheduled for:  04/21/2018 at 1330 Verified arrival time and place:  [facility] @ 0830 Confirmed AM meds to be taken pre-cath with sip of water: Hold 04/20/2018- lasix/ take 1/2 lantus dose Day of cath hold-lasix, losartan, amaryl, insulin  TAKE prior to procedure:  ASA, coreg and prilosec Confirmed patient has responsible person to drive home post procedure and observe patient for 24 hours:  yes Addl concerns:  none

## 2018-04-21 ENCOUNTER — Ambulatory Visit (HOSPITAL_COMMUNITY)
Admission: RE | Admit: 2018-04-21 | Discharge: 2018-04-21 | Disposition: A | Discharge status: Home / Self Care | Payer: Medicare Other | Source: Ambulatory Visit | Attending: Internal Medicine | Admitting: Internal Medicine

## 2018-04-21 ENCOUNTER — Other Ambulatory Visit: Payer: Self-pay

## 2018-04-21 ENCOUNTER — Encounter (HOSPITAL_COMMUNITY)
Admission: RE | Disposition: A | Discharge status: Home / Self Care | Payer: Self-pay | Source: Ambulatory Visit | Attending: Internal Medicine

## 2018-04-21 DIAGNOSIS — I340 Nonrheumatic mitral (valve) insufficiency: Secondary | ICD-10-CM | POA: Insufficient documentation

## 2018-04-21 DIAGNOSIS — G8929 Other chronic pain: Secondary | ICD-10-CM | POA: Diagnosis not present

## 2018-04-21 DIAGNOSIS — I428 Other cardiomyopathies: Secondary | ICD-10-CM | POA: Diagnosis not present

## 2018-04-21 DIAGNOSIS — Z853 Personal history of malignant neoplasm of breast: Secondary | ICD-10-CM | POA: Insufficient documentation

## 2018-04-21 DIAGNOSIS — E1151 Type 2 diabetes mellitus with diabetic peripheral angiopathy without gangrene: Secondary | ICD-10-CM | POA: Diagnosis not present

## 2018-04-21 DIAGNOSIS — I739 Peripheral vascular disease, unspecified: Secondary | ICD-10-CM | POA: Diagnosis present

## 2018-04-21 DIAGNOSIS — E785 Hyperlipidemia, unspecified: Secondary | ICD-10-CM | POA: Diagnosis not present

## 2018-04-21 DIAGNOSIS — I2510 Atherosclerotic heart disease of native coronary artery without angina pectoris: Secondary | ICD-10-CM | POA: Insufficient documentation

## 2018-04-21 DIAGNOSIS — M79669 Pain in unspecified lower leg: Secondary | ICD-10-CM | POA: Diagnosis not present

## 2018-04-21 DIAGNOSIS — I5022 Chronic systolic (congestive) heart failure: Secondary | ICD-10-CM | POA: Diagnosis present

## 2018-04-21 DIAGNOSIS — I110 Hypertensive heart disease with heart failure: Secondary | ICD-10-CM | POA: Insufficient documentation

## 2018-04-21 DIAGNOSIS — I701 Atherosclerosis of renal artery: Secondary | ICD-10-CM | POA: Diagnosis not present

## 2018-04-21 HISTORY — PX: ABDOMINAL AORTOGRAM W/LOWER EXTREMITY: CATH118223

## 2018-04-21 HISTORY — PX: LEFT HEART CATH AND CORONARY ANGIOGRAPHY: CATH118249

## 2018-04-21 LAB — GLUCOSE, CAPILLARY
GLUCOSE-CAPILLARY: 120 mg/dL — AB (ref 70–99)
Glucose-Capillary: 78 mg/dL (ref 70–99)

## 2018-04-21 SURGERY — LEFT HEART CATH AND CORONARY ANGIOGRAPHY
Anesthesia: LOCAL

## 2018-04-21 MED ORDER — SODIUM CHLORIDE 0.9 % IV SOLN: INTRAVENOUS | Status: DC

## 2018-04-21 MED ORDER — SODIUM CHLORIDE 0.9 % IV SOLN: 250.0000 mL | INTRAVENOUS | Status: DC | PRN

## 2018-04-21 MED ORDER — LIDOCAINE HCL (PF) 1 % IJ SOLN
INTRAMUSCULAR | Status: DC | PRN
  Administered 2018-04-21: 2 mL via INTRADERMAL

## 2018-04-21 MED ORDER — HEPARIN (PORCINE) IN NACL 1000-0.9 UT/500ML-% IV SOLN
INTRAVENOUS | Status: DC | PRN
  Administered 2018-04-21 (×2): 500 mL

## 2018-04-21 MED ORDER — MIDAZOLAM HCL 2 MG/2ML IJ SOLN
INTRAMUSCULAR | Status: AC
  Filled 2018-04-21: qty 2

## 2018-04-21 MED ORDER — VERAPAMIL HCL 2.5 MG/ML IV SOLN
INTRAVENOUS | Status: DC | PRN
  Administered 2018-04-21: 10 mL via INTRA_ARTERIAL

## 2018-04-21 MED ORDER — SODIUM CHLORIDE 0.9% FLUSH: 3.0000 mL | Freq: Two times a day (BID) | INTRAVENOUS | Status: DC

## 2018-04-21 MED ORDER — HYDRALAZINE HCL 20 MG/ML IJ SOLN
INTRAMUSCULAR | Status: AC
  Filled 2018-04-21: qty 1

## 2018-04-21 MED ORDER — HEPARIN (PORCINE) IN NACL 1000-0.9 UT/500ML-% IV SOLN
INTRAVENOUS | Status: AC
  Filled 2018-04-21: qty 1000

## 2018-04-21 MED ORDER — SODIUM CHLORIDE 0.9% FLUSH: 3.0000 mL | INTRAVENOUS | Status: DC | PRN

## 2018-04-21 MED ORDER — ONDANSETRON HCL 4 MG/2ML IJ SOLN: 4.0000 mg | Freq: Four times a day (QID) | INTRAMUSCULAR | Status: DC | PRN

## 2018-04-21 MED ORDER — MIDAZOLAM HCL 2 MG/2ML IJ SOLN
INTRAMUSCULAR | Status: DC | PRN
  Administered 2018-04-21 (×2): 1 mg via INTRAVENOUS

## 2018-04-21 MED ORDER — ACETAMINOPHEN 325 MG PO TABS: 650.0000 mg | ORAL_TABLET | ORAL | Status: DC | PRN

## 2018-04-21 MED ORDER — LIDOCAINE HCL (PF) 1 % IJ SOLN
INTRAMUSCULAR | Status: AC
  Filled 2018-04-21: qty 30

## 2018-04-21 MED ORDER — FENTANYL CITRATE (PF) 100 MCG/2ML IJ SOLN
INTRAMUSCULAR | Status: DC | PRN
  Administered 2018-04-21 (×3): 25 ug via INTRAVENOUS

## 2018-04-21 MED ORDER — SODIUM CHLORIDE 0.9 % IV SOLN
INTRAVENOUS | Status: DC
  Administered 2018-04-21: 09:00:00 via INTRAVENOUS

## 2018-04-21 MED ORDER — IODIXANOL 320 MG/ML IV SOLN
INTRAVENOUS | Status: DC | PRN
  Administered 2018-04-21: 90 mL via INTRA_ARTERIAL

## 2018-04-21 MED ORDER — ASPIRIN 81 MG PO CHEW: 81.0000 mg | CHEWABLE_TABLET | ORAL | Status: DC

## 2018-04-21 MED ORDER — HEPARIN SODIUM (PORCINE) 1000 UNIT/ML IJ SOLN
INTRAMUSCULAR | Status: DC | PRN
  Administered 2018-04-21: 4000 [IU] via INTRAVENOUS

## 2018-04-21 MED ORDER — HYDRALAZINE HCL 20 MG/ML IJ SOLN
INTRAMUSCULAR | Status: DC | PRN
  Administered 2018-04-21: 10 mg via INTRAVENOUS

## 2018-04-21 MED ORDER — FENTANYL CITRATE (PF) 100 MCG/2ML IJ SOLN
INTRAMUSCULAR | Status: AC
  Filled 2018-04-21: qty 2

## 2018-04-21 SURGICAL SUPPLY — 13 items
CATH INFINITI 5 FR JL3.5 (CATHETERS) ×1 IMPLANT
CATH INFINITI 5FR MULTPACK ANG (CATHETERS) ×1 IMPLANT
DEVICE RAD COMP TR BAND LRG (VASCULAR PRODUCTS) ×1 IMPLANT
GLIDESHEATH SLEND SS 6F .021 (SHEATH) ×1 IMPLANT
GUIDEWIRE INQWIRE 1.5J.035X260 (WIRE) IMPLANT
INQWIRE 1.5J .035X260CM (WIRE) ×2
KIT HEART LEFT (KITS) ×2 IMPLANT
PACK CARDIAC CATHETERIZATION (CUSTOM PROCEDURE TRAY) ×2 IMPLANT
SHEATH PROBE COVER 6X72 (BAG) ×1 IMPLANT
SYRINGE MEDRAD AVANTA MACH 7 (SYRINGE) ×1 IMPLANT
TRANSDUCER W/STOPCOCK (MISCELLANEOUS) ×2 IMPLANT
TUBING CIL FLEX 10 FLL-RA (TUBING) ×2 IMPLANT
TUBING HIGH PRESSURE 120CM (CONNECTOR) ×1 IMPLANT

## 2018-04-21 NOTE — Interval H&P Note (Signed)
History and Physical Interval Note:  04/21/2018 3:17 PM  Tobi Tyreisha Ungar  has presented today for cardiac catheterization, with the diagnosis of systolic heart failure and claudication.  The various methods of treatment have been discussed with the patient and family. After consideration of risks, benefits and other options for treatment, the patient has consented to  Procedure(s): LEFT HEART CATH AND CORONARY ANGIOGRAPHY (N/A) ABDOMINAL AORTOGRAM W/LOWER EXTREMITY (N/A) as a surgical intervention .  The patient's history has been reviewed, patient examined, no change in status, stable for surgery.  I have reviewed the patient's chart and labs.  Questions were answered to the patient's satisfaction.    Cath Lab Visit (complete for each Cath Lab visit)  Clinical Evaluation Leading to the Procedure:   ACS: No.  Non-ACS:    Anginal Classification: CCS III  Anti-ischemic medical therapy: Minimal Therapy (1 class of medications)  Non-Invasive Test Results: Low-risk stress test findings: cardiac mortality <1%/year; newly reduced LVEF (40-45%) - intermediate risk.  Prior CABG: No previous CABG  Clive Parcel

## 2018-04-21 NOTE — Discharge Instructions (Signed)
Radial Site Care °Refer to this sheet in the next few weeks. These instructions provide you with information about caring for yourself after your procedure. Your health care provider may also give you more specific instructions. Your treatment has been planned according to current medical practices, but problems sometimes occur. Call your health care provider if you have any problems or questions after your procedure. °What can I expect after the procedure? °After your procedure, it is typical to have the following: °· Bruising at the radial site that usually fades within 1-2 weeks. °· Blood collecting in the tissue (hematoma) that may be painful to the touch. It should usually decrease in size and tenderness within 1-2 weeks. ° °Follow these instructions at home: °· Take medicines only as directed by your health care provider. °· You may shower 24-48 hours after the procedure or as directed by your health care provider. Remove the bandage (dressing) and gently wash the site with plain soap and water. Pat the area dry with a clean towel. Do not rub the site, because this may cause bleeding. °· Do not take baths, swim, or use a hot tub until your health care provider approves. °· Check your insertion site every day for redness, swelling, or drainage. °· Do not apply powder or lotion to the site. °· Do not flex or bend the affected arm for 24 hours or as directed by your health care provider. °· Do not push or pull heavy objects with the affected arm for 24 hours or as directed by your health care provider. °· Do not lift over 10 lb (4.5 kg) for 5 days after your procedure or as directed by your health care provider. °· Ask your health care provider when it is okay to: °? Return to work or school. °? Resume usual physical activities or sports. °? Resume sexual activity. °· Do not drive home if you are discharged the same day as the procedure. Have someone else drive you. °· You may drive 24 hours after the procedure  unless otherwise instructed by your health care provider. °· Do not operate machinery or power tools for 24 hours after the procedure. °· If your procedure was done as an outpatient procedure, which means that you went home the same day as your procedure, a responsible adult should be with you for the first 24 hours after you arrive home. °· Keep all follow-up visits as directed by your health care provider. This is important. °Contact a health care provider if: °· You have a fever. °· You have chills. °· You have increased bleeding from the radial site. Hold pressure on the site. °Get help right away if: °· You have unusual pain at the radial site. °· You have redness, warmth, or swelling at the radial site. °· You have drainage (other than a small amount of blood on the dressing) from the radial site. °· The radial site is bleeding, and the bleeding does not stop after 30 minutes of holding steady pressure on the site. °· Your arm or hand becomes pale, cool, tingly, or numb. °This information is not intended to replace advice given to you by your health care provider. Make sure you discuss any questions you have with your health care provider. °Document Released: 07/24/2010 Document Revised: 11/27/2015 Document Reviewed: 01/07/2014 °Elsevier Interactive Patient Education © 2018 [employer] ° ° ° °Moderate Conscious Sedation, Adult, Care After °These instructions provide you with information about caring for yourself after your procedure. Your health care provider   may also give you more specific instructions. Your treatment has been planned according to current medical practices, but problems sometimes occur. Call your health care provider if you have any problems or questions after your procedure. °What can I expect after the procedure? °After your procedure, it is common: °· To feel sleepy for several hours. °· To feel clumsy and have poor balance for several hours. °· To have poor judgment for several  hours. °· To vomit if you eat too soon. ° °Follow these instructions at home: °For at least 24 hours after the procedure: ° °· Do not: °? Participate in activities where you could fall or become injured. °? Drive. °? Use heavy machinery. °? Drink alcohol. °? Take sleeping pills or medicines that cause drowsiness. °? Make important decisions or sign legal documents. °? Take care of children on your own. °· Rest. °Eating and drinking °· Follow the diet recommended by your health care provider. °· If you vomit: °? Drink water, juice, or soup when you can drink without vomiting. °? Make sure you have little or no nausea before eating solid foods. °General instructions °· Have a responsible adult stay with you until you are awake and alert. °· Take over-the-counter and prescription medicines only as told by your health care provider. °· If you smoke, do not smoke without supervision. °· Keep all follow-up visits as told by your health care provider. This is important. °Contact a health care provider if: °· You keep feeling nauseous or you keep vomiting. °· You feel light-headed. °· You develop a rash. °· You have a fever. °Get help right away if: °· You have trouble breathing. °This information is not intended to replace advice given to you by your health care provider. Make sure you discuss any questions you have with your health care provider. °Document Released: 04/11/2013 Document Revised: 11/24/2015 Document Reviewed: 10/11/2015 °Elsevier Interactive Patient Education © 2018 [employer]vier Inc. ° °

## 2018-04-24 ENCOUNTER — Encounter (HOSPITAL_COMMUNITY): Payer: Self-pay | Admitting: Internal Medicine

## 2018-04-25 ENCOUNTER — Telehealth: Payer: Self-pay | Admitting: *Deleted

## 2018-04-25 DIAGNOSIS — I10 Essential (primary) hypertension: Secondary | ICD-10-CM

## 2018-04-25 DIAGNOSIS — I5022 Chronic systolic (congestive) heart failure: Secondary | ICD-10-CM

## 2018-04-25 NOTE — Telephone Encounter (Signed)
Patient verbalized understanding of plan of care for BP check and lab work this Thursday or Friday. She already has follow up with Dr End on 05/18/18. Transferred to scheduler.

## 2018-04-25 NOTE — Telephone Encounter (Signed)
-----   Message from Nelva Bush, MD sent at 04/21/2018  4:56 PM EDT ----- Regarding: Post-cath f/u Hi Anderson Malta,  Can you set Ms. Espinola up for a BMP and blood pressure check on Thursday or Friday?  Also, can we have her follow-up with me or an APP in 2 to 3 weeks?  Thanks.  Gerald Stabs

## 2018-04-27 ENCOUNTER — Other Ambulatory Visit: Payer: Self-pay | Admitting: Hematology and Oncology

## 2018-04-27 DIAGNOSIS — C50411 Malignant neoplasm of upper-outer quadrant of right female breast: Secondary | ICD-10-CM

## 2018-04-28 ENCOUNTER — Ambulatory Visit (INDEPENDENT_AMBULATORY_CARE_PROVIDER_SITE_OTHER): Payer: Medicare Other | Admitting: *Deleted

## 2018-04-28 ENCOUNTER — Other Ambulatory Visit (INDEPENDENT_AMBULATORY_CARE_PROVIDER_SITE_OTHER): Payer: Medicare Other

## 2018-04-28 VITALS — BP 140/62 | HR 77 | Ht 63.0 in | Wt 171.0 lb

## 2018-04-28 DIAGNOSIS — I10 Essential (primary) hypertension: Secondary | ICD-10-CM

## 2018-04-28 DIAGNOSIS — I5022 Chronic systolic (congestive) heart failure: Secondary | ICD-10-CM

## 2018-04-28 NOTE — Progress Notes (Signed)
1.) Reason for visit: BP check and lab work  2.) Name of MD requesting visit: Dr End.*  3.) H&P: HTN, HLD, DIABETES, s/p cath on 04/21/18  4.) ROS related to problem: Patient here for lab work and BP check today. BP 140/62, HR 77. No complaints from patient today. She states she is feeling well.  5.) Assessment and plan per MD: Reviewed by Dr End. No further changes at this time. Will await lab results. Keep follow up as scheduled.  Patient verbalized understanding of recommendations and plan of care.

## 2018-04-29 LAB — BASIC METABOLIC PANEL
BUN / CREAT RATIO: 13 (ref 12–28)
BUN: 18 mg/dL (ref 8–27)
CO2: 26 mmol/L (ref 20–29)
CREATININE: 1.38 mg/dL — AB (ref 0.57–1.00)
Calcium: 9.6 mg/dL (ref 8.7–10.3)
Chloride: 95 mmol/L — ABNORMAL LOW (ref 96–106)
GFR calc Af Amer: 43 mL/min/{1.73_m2} — ABNORMAL LOW (ref >59)
GFR, EST NON AFRICAN AMERICAN: 37 mL/min/{1.73_m2} — AB (ref >59)
GLUCOSE: 107 mg/dL — AB (ref 65–99)
Potassium: 4.8 mmol/L (ref 3.5–5.2)
SODIUM: 137 mmol/L (ref 134–144)

## 2018-05-10 ENCOUNTER — Ambulatory Visit: Payer: Medicare Other | Admitting: Internal Medicine

## 2018-05-12 ENCOUNTER — Ambulatory Visit: Payer: Medicare Other | Admitting: Family Medicine

## 2018-05-16 ENCOUNTER — Encounter: Payer: Self-pay | Admitting: Family Medicine

## 2018-05-16 ENCOUNTER — Ambulatory Visit: Payer: Medicare Other | Admitting: Family Medicine

## 2018-05-16 VITALS — BP 152/68 | HR 93 | Temp 98.4°F | Ht 63.0 in | Wt 171.8 lb

## 2018-05-16 DIAGNOSIS — N183 Chronic kidney disease, stage 3 (moderate): Secondary | ICD-10-CM

## 2018-05-16 DIAGNOSIS — R251 Tremor, unspecified: Secondary | ICD-10-CM | POA: Diagnosis not present

## 2018-05-16 DIAGNOSIS — I2510 Atherosclerotic heart disease of native coronary artery without angina pectoris: Secondary | ICD-10-CM | POA: Insufficient documentation

## 2018-05-16 DIAGNOSIS — Z794 Long term (current) use of insulin: Secondary | ICD-10-CM

## 2018-05-16 DIAGNOSIS — E1122 Type 2 diabetes mellitus with diabetic chronic kidney disease: Secondary | ICD-10-CM

## 2018-05-16 DIAGNOSIS — I10 Essential (primary) hypertension: Secondary | ICD-10-CM

## 2018-05-16 DIAGNOSIS — J309 Allergic rhinitis, unspecified: Secondary | ICD-10-CM

## 2018-05-16 NOTE — Assessment & Plan Note (Signed)
Potentially could be benign essential tremor.  She does have a family history of Parkinson's though she does not have any other Parkinson's features.  Will refer to neurology for evaluation.

## 2018-05-16 NOTE — Assessment & Plan Note (Signed)
Discussed risk factor management.  She will continue to follow with cardiology.

## 2018-05-16 NOTE — Progress Notes (Signed)
[name], MD Phone: [phone]  Kelsey Mason is a 77 y.o. female who presents today for follow-up.  CC: Hypertension, CAD, allergic rhinitis, diabetes, tremor  HTN: Had been in the 130s over 60s until last night it went up to 150/78.  She is taking carvedilol, losartan, and Lasix.  No chest pain, shortness of breath, or edema.  She underwent cardiac catheterization for CAD.  They recommended medical management.  She is on Crestor and aspirin.  Her diabetes has been under better control.  She notes fasting down to 80 though a number of days she has been down to 50 or 60 fasting.  She will be shaking when this occurs and drink a soda.  Typically 140-200s in the afternoon.  She is on 44 units of Lantus, glyburide 2 mg twice daily, and Trulicity.  She notes no polyuria or polydipsia.  Allergic rhinitis: Patient notes coughing and sneezing with postnasal drip starting yesterday.  No fevers.  Nonproductive.  Not blowing anything out of her nose.  Tremor: Patient notes for the last 6 or 7 months she has had a tremor particularly when she holds her phone or if she holds up later paper.  She does have some resting tremor at times.  She does note an uncle had Parkinson's.  Social History   Tobacco Use  Smoking Status Former Smoker  . Packs/day: 0.25  . Years: 1.00  . Pack years: 0.25  . Types: Cigarettes  . Last attempt to quit: 1963  . Years since quitting: 56.9  Smokeless Tobacco Never Used     ROS see history of present illness  Objective  Physical Exam Vitals:   05/16/18 1535  BP: (!) 152/68  Pulse: 93  Temp: 98.4 F (36.9 C)  SpO2: 99%    BP Readings from Last 3 Encounters:  05/16/18 (!) 152/68  04/28/18 140/62  04/21/18 (!) 142/66   Wt Readings from Last 3 Encounters:  05/16/18 171 lb 12.8 oz (77.9 kg)  04/28/18 171 lb (77.6 kg)  04/21/18 167 lb (75.8 kg)    Physical Exam  Constitutional: No distress.  HENT:  Head: Normocephalic and  atraumatic.  Mouth/Throat: Oropharynx is clear and moist.  Eyes: Pupils are equal, round, and reactive to light. Conjunctivae are normal.  Neck: Neck supple.  Cardiovascular: Normal rate, regular rhythm and normal heart sounds.  Pulmonary/Chest: Effort normal and breath sounds normal.  Musculoskeletal: She exhibits no edema.  Lymphadenopathy:    She has no cervical adenopathy.  Neurological: She is alert.  CN 2-12 intact, 5/5 strength in bilateral biceps, triceps, grip, quads, hamstrings, plantar and dorsiflexion, sensation to light touch intact in bilateral UE and LE, normal gait, absent patellar reflexes, normal finger-to-nose, normal rapid alternating movements, no cogwheel rigidity, there is tremor when holding a pen and when she lifts her hands up though not when they are resting at her side  Skin: Skin is warm and dry. She is not diaphoretic.     Assessment/Plan: Please see individual problem list.  Essential hypertension Had been well controlled.  She sees her cardiologist later this week and will have her blood pressure checked then as well.  If it is still elevated then we may need to consider adding amlodipine.  Type 2 diabetes mellitus with stage 3 chronic kidney disease, with long-term current use of insulin ([place]) She will continue to follow with endocrinology.  It appears she is having fairly frequent hypoglycemic episodes.  We will decrease her glimepiride dosing to once daily  and have her follow-up with endocrinology.  CAD (coronary artery disease) Discussed risk factor management.  She will continue to follow with cardiology.  Allergic rhinitis Discussed adding Claritin.  Tremor Potentially could be benign essential tremor.  She does have a family history of Parkinson's though she does not have any other Parkinson's features.  Will refer to neurology for evaluation.   Orders Placed This Encounter  Procedures  . Basic Metabolic Panel (BMET)  . TSH  . Ambulatory  referral to Neurology    Referral Priority:   Routine    Referral Type:   Consultation    Referral Reason:   Specialty Services Required    Requested Specialty:   Neurology    Number of Visits Requested:   1    No orders of the defined types were placed in this encounter.    [name], MD [place]

## 2018-05-16 NOTE — Assessment & Plan Note (Signed)
Had been well controlled.  She sees her cardiologist later this week and will have her blood pressure checked then as well.  If it is still elevated then we may need to consider adding amlodipine.

## 2018-05-16 NOTE — Patient Instructions (Signed)
Nice to see you. Please try Claritin for your allergies. I would suggest decreasing your glimepiride to 2 mg once daily.  I would take this in the morning with breakfast. Please continue to monitor your blood pressure and see Dr. Saunders Revel later this week.  If it remains elevated when you see him you may need additional blood pressure medication.

## 2018-05-16 NOTE — Assessment & Plan Note (Signed)
Discussed adding Claritin.

## 2018-05-16 NOTE — Assessment & Plan Note (Signed)
She will continue to follow with endocrinology.  It appears she is having fairly frequent hypoglycemic episodes.  We will decrease her glimepiride dosing to once daily and have her follow-up with endocrinology.

## 2018-05-17 LAB — TSH: TSH: 5.02 u[IU]/mL — ABNORMAL HIGH (ref 0.35–4.50)

## 2018-05-17 LAB — BASIC METABOLIC PANEL
BUN: 18 mg/dL (ref 6–23)
CO2: 30 mEq/L (ref 19–32)
Calcium: 9.5 mg/dL (ref 8.4–10.5)
Chloride: 98 mEq/L (ref 96–112)
Creatinine, Ser: 1.42 mg/dL — ABNORMAL HIGH (ref 0.40–1.20)
GFR: 38.11 mL/min — AB (ref >60.00)
Glucose, Bld: 155 mg/dL — ABNORMAL HIGH (ref 70–99)
POTASSIUM: 3.9 meq/L (ref 3.5–5.1)
SODIUM: 137 meq/L (ref 135–145)

## 2018-05-18 ENCOUNTER — Encounter: Payer: Self-pay | Admitting: Internal Medicine

## 2018-05-18 ENCOUNTER — Ambulatory Visit: Payer: Medicare Other | Admitting: Internal Medicine

## 2018-05-18 VITALS — BP 119/78 | HR 90 | Ht 63.0 in | Wt 170.8 lb

## 2018-05-18 DIAGNOSIS — I2510 Atherosclerotic heart disease of native coronary artery without angina pectoris: Secondary | ICD-10-CM | POA: Diagnosis not present

## 2018-05-18 DIAGNOSIS — E785 Hyperlipidemia, unspecified: Secondary | ICD-10-CM

## 2018-05-18 DIAGNOSIS — I739 Peripheral vascular disease, unspecified: Secondary | ICD-10-CM

## 2018-05-18 DIAGNOSIS — I10 Essential (primary) hypertension: Secondary | ICD-10-CM | POA: Diagnosis not present

## 2018-05-18 DIAGNOSIS — I5022 Chronic systolic (congestive) heart failure: Secondary | ICD-10-CM

## 2018-05-18 NOTE — Patient Instructions (Signed)
Medication Instructions:  Your physician recommends that you continue on your current medications as directed. Please refer to the Current Medication list given to you today.  If you need a refill on your cardiac medications before your next appointment, please call your pharmacy.   Lab work: none If you have labs (blood work) drawn today and your tests are completely normal, you will receive your results only by: [name] MyChart Message (if you have MyChart) OR . A paper copy in the mail If you have any lab test that is abnormal or we need to change your treatment, we will call you to review the results.  Testing/Procedures: none  Follow-Up: At [facility], you and your health needs are our priority.  As part of our continuing mission to provide you with exceptional heart care, we have created designated Provider Care Teams.  These Care Teams include your primary Cardiologist (physician) and Advanced Practice Providers (APPs -  Physician Assistants and Nurse Practitioners) who all work together to provide you with the care you need, when you need it. You will need a follow up appointment in 6 months.  Please call our office 2 months in advance to schedule this appointment.  You may see DR Harrell Gave END or one of the following Advanced Practice Providers on your designated Care Team:   Murray Hodgkins, NP Christell Faith, PA-C . Marrianne Mood, PA-C

## 2018-05-18 NOTE — Progress Notes (Signed)
Follow-up Outpatient Visit Date: 05/18/2018  Primary Care Provider: Leone Haven, MD [address] [place] [address]  Chief Complaint: Follow-up shortness of breath and leg pain  HPI:  Kelsey Mason is a 77 y.o. year-old female with history of nonobstructive coronary artery disease, PAD with small vessel disease involving the runoff vessels, nonischemic cardiomyopathy with normalization of LVEF, mitral regurgitation, hypertension, hyperlipidemia, type 2 diabetes mellitus,mild-moderate renal artery stenosis,and breast cancer, who presents for follow-up of cardiomyopathy and PAD.  I last saw Kelsey Mason about a month ago, at which time she reported continued dyspnea on exertion and claudication.  We agreed to perform left heart catheterization as well as abdominal aortogram with runoff.  She was found to have mild to moderate, nonobstructive three-vessel coronary artery disease.  Aortogram and runoff showed no significant disease other than tapering/distal occlusion of the anterior tibial and peroneal arteries on the right.  Medical therapy was recommended.  Today, Kelsey Mason reports that her breathing is about the same.  She notices it only with strenuous activity such as pulling trash cans up her driveway.  She has been able to perform line dancing without any limitations since her catheterization.  She also notes that her prior leg pain with dancing has resolved.  She has not had chest pain, palpitations, lightheadedness, or edema.  She is tolerating her medications well.  She has not had any issues at her recent left radial arteriotomy site.  --------------------------------------------------------------------------------------------------  Cardiovascular History & Procedures: Cardiovascular Problems:  Non-ischemic cardiomyopathy  Mitral regurgitation  Claudication  Risk Factors:  HTN, HLD, DM2, and age > 73  Cath/PCI:  LHC (04/21/2018): LMCA normal.  LAD  with 50% mid vessel stenosis.  LCx proper without disease.  Dominant OM3 with 50-60% proximal stenosis.  RCA with 50% ostial stenosis.  Mildly elevated LVEDP (15 to 20 mmHg).  Abdominal aortogram and runoff (04/21/2018): Aorta, inflow vessels, and outflow vessels.  There is tapering/occlusion of the distal anterior tibial and peroneal arteries on the right.  There is three-vessel runoff on the left.  CV Surgery:  None  EP Procedures and Devices:  None  Non-Invasive Evaluation(s):  TTE (03/14/18): Mildly dilated LV with LVEF 40-45%.  Global hypokinesis with grade 2 diastolic dysfunction.  Moderate MR and mild LA enlargement.  Normal RV size and function.  ABIs (02/22/2018): Noncompressible runoff vessels bilaterally with triphasic waveforms. TBI's are normal bilaterally.  Renal artery Doppler (04/20/17): Mild to moderate (less than 60%) stenosis of the right renal artery. No significant left renal artery disease.  TTE (01/14/17): Normal LV size with LVEF 50-55%. Anteroseptal hypokinesis is noted, as well as grade 1 diastolic dysfunction. Mild to moderate MR. Normal RV size and function. Mild pulmonary hypertension.  Pharmacologic myocardial perfusion stress test (06/16/16): Low risk study with moderate in size, moderate in severity, fixed defect involving the septum most likely related to LBBB. No evidence of ischemia. LVEF 50% by automated calculation and likely higher based on visual estimation.  ABIs (06/10/16): ABIs: Right not obtainable, left 1.4. TBIs right 1.1, left not obtainable. Bilateral great toe PPG's are normal. Bilateral Mason femoral, popliteal, peroneal, anterior tibial, and posterior tibial artery waveforms are brisk and triphasic.  TTE (02/23/16, [facility]): Mildly dilated left ventricle with mild to moderate LV dysfunction (EF 35-45%). Grade 1 diastolic dysfunction. Moderate mitral regurgitation. Mild left atrial enlargement. Normal RV function.  TTE (01/01/14,  [facility]): Moderate to severe LV dysfunction (EF 30%) with mild LVH and mild left ventricular  dilation. Mitral annular calcification with moderate MR and moderate TR noted. Normal right ventricular contraction.  MUGA (12/25/12): Normal contraction and wall motion. EF 63%.  Recent CV Pertinent Labs: Lab Results  Component Value Date   CHOL 134 11/25/2017   HDL 45 11/25/2017   LDLCALC 70 11/25/2017   LDLDIRECT 178.7 10/02/2012   TRIG 96 11/25/2017   CHOLHDL 3.0 11/25/2017   K 3.9 05/16/2018   K 4.7 01/07/2014   BUN 18 05/16/2018   BUN 18 04/28/2018   BUN 19 (H) 01/07/2014   CREATININE 1.42 (H) 05/16/2018   CREATININE 1.24 01/07/2014    Past medical and surgical history were reviewed and updated in EPIC.  Current Meds  Medication Sig  . aspirin 81 MG tablet Take 81 mg by mouth daily.  . blood glucose meter kit and supplies KIT Dispense based on patient and insurance preference. Use up to four times daily as directed. One Touch Ultra mini meter (FOR ICD-9 250.00, 250.01). Dx: E11.9  . furosemide (LASIX) 20 MG tablet Take 1 tablet (20 mg total) by mouth daily.  [name] glimepiride (AMARYL) 2 MG tablet TAKE 1 TABLET (2 MG TOTAL) BY MOUTH 2 (TWO) TIMES DAILY. (Patient taking differently: Take 2 mg by mouth 2 (two) times daily. TAKE 1 TABLET (2 MG TOTAL) BY MOUTH 2 (TWO) TIMES DAILY.)  . glucose blood (ONE TOUCH ULTRA TEST) test strip USE TO TEST BLOOD SUGAR UP TO 4 TIMES DAILY  . LANTUS SOLOSTAR 100 UNIT/ML Solostar Pen INJECT 20 UNITS INTO THE SKIN DAILY AT 10 PM. (Patient taking differently: Inject 44 Units into the skin at bedtime. )  . letrozole (FEMARA) 2.5 MG tablet TAKE 1 TABLET BY MOUTH EVERY DAY  . loratadine-pseudoephedrine (CLARITIN-D 24-HOUR) 10-240 MG 24 hr tablet Take 1 tablet by mouth daily.  [name] losartan (COZAAR) 50 MG tablet Take 50 mg by mouth 2 (two) times daily.  [name] NOVOFINE 32G X 6 MM MISC USE AS DIRECTED WITH LANTUS PEN  . TRULICITY 1.5 NO/1.7RN SOPN Inject 1.5 mg as  directed once a week.  . vitamin B-12 (CYANOCOBALAMIN) 1000 MCG tablet Take 1,000 mcg by mouth daily.    Allergies: Advil [ibuprofen]; Aleve [naproxen]; Fluticasone-salmeterol; Lipitor [atorvastatin]; Naproxen sodium; and Penicillins  Social History   Tobacco Use  . Smoking status: Former Smoker    Packs/day: 0.25    Years: 1.00    Pack years: 0.25    Types: Cigarettes    Last attempt to quit: 1963    Years since quitting: 56.9  . Smokeless tobacco: Never Used  Substance Use Topics  . Alcohol use: No    Alcohol/week: 0.0 standard drinks  . Drug use: No    Family History  Problem Relation Age of Onset  . Alcohol abuse Mother   . Heart disease Mother   . Hypertension Mother   . Breast cancer Mother 27  . Alcohol abuse Father   . Heart disease Father   . Hypertension Father   . Heart disease Brother   . Heart attack Brother     Review of Systems: A 12-system review of systems was performed and was negative except as noted in the HPI.  --------------------------------------------------------------------------------------------------  Physical Exam: BP 119/78 (BP Location: Left Arm, Patient Position: Sitting, Cuff Size: Normal)   Pulse 90   Ht '5\' 3"'  (1.6 m)   Wt 170 lb 12 oz (77.5 kg)   BMI 30.25 kg/m   General: NAD. HEENT: No conjunctival pallor or scleral icterus. Moist mucous membranes.  OP clear. Neck: Supple without lymphadenopathy, thyromegaly, JVD, or HJR. Lungs: Normal work of breathing. Clear to auscultation bilaterally without wheezes or crackles. Heart: Regular rate and rhythm without murmurs, rubs, or gallops. Non-displaced PMI. Abd: Bowel sounds present. Soft, NT/ND without hepatosplenomegaly Ext: No lower extremity edema.  2+ radial pulses bilaterally.  Left radial arteriotomy site is well-healed. Skin: Warm and dry without rash.  EKG: Normal sinus rhythm with left bundle branch block, unchanged.  Lab Results  Component Value Date   WBC 9.4  04/14/2018   HGB 12.0 04/14/2018   HCT 35.7 04/14/2018   MCV 88 04/14/2018   PLT 209 04/14/2018    Lab Results  Component Value Date   NA 137 05/16/2018   K 3.9 05/16/2018   CL 98 05/16/2018   CO2 30 05/16/2018   BUN 18 05/16/2018   CREATININE 1.42 (H) 05/16/2018   GLUCOSE 155 (H) 05/16/2018   ALT 16 03/13/2018    Lab Results  Component Value Date   CHOL 134 11/25/2017   HDL 45 11/25/2017   LDLCALC 70 11/25/2017   LDLDIRECT 178.7 10/02/2012   TRIG 96 11/25/2017   CHOLHDL 3.0 11/25/2017    --------------------------------------------------------------------------------------------------  ASSESSMENT AND PLAN: Coronary artery disease No new symptoms since last visit.  If anything, Kelsey Mason feels better in regard to her exercise capacity.  Catheterization last month showed moderate nonobstructive three-vessel disease.  We will continue with medical therapy and secondary prevention, including aspirin and rosuvastatin.  Chronic systolic heart failure Kelsey Mason appears euvolemic with NYHA class II heart failure symptoms.  We will plan to continue current doses of carvedilol, losartan, and furosemide.  Hyperlipidemia LDL reasonable on last check in May (70).  Continue rosuvastatin 5 mg daily, given history of myalgias with high intensity statin therapy in the past.  Peripheral arterial disease No further claudication.  Previous ABIs suggest medial calcification but no obstructive disease.  Abdominal aortogram and bilateral lower extremity runoff last month showed only termination of the distal right anterior tibial and peroneal arteries.  This may represent small vessel disease versus anatomic variant.  Given lack of further pain and no evidence of critical limb ischemia.  No further intervention or medication changes recommended.  Hypertension Blood pressure is well controlled today.  Continue current medications.  Follow-up: Return to clinic in 6 months.  Nelva Bush, MD 05/18/2018 9:07 AM

## 2018-05-19 ENCOUNTER — Encounter: Payer: Self-pay | Admitting: Internal Medicine

## 2018-05-19 DIAGNOSIS — I739 Peripheral vascular disease, unspecified: Secondary | ICD-10-CM | POA: Insufficient documentation

## 2018-05-25 LAB — HM DIABETES EYE EXAM

## 2018-06-07 ENCOUNTER — Ambulatory Visit: Payer: Medicare Other | Admitting: Internal Medicine

## 2018-07-31 ENCOUNTER — Other Ambulatory Visit: Payer: Self-pay | Admitting: Family Medicine

## 2018-08-28 LAB — CBC AND DIFFERENTIAL: WBC: 11.1

## 2018-08-28 LAB — BASIC METABOLIC PANEL
BUN: 24 — AB (ref 4–21)
Creatinine: 1.3 — AB (ref <=1.1)
Glucose: 148
Potassium: 4.1 (ref 3.4–5.3)
SODIUM: 141 (ref 137–147)

## 2018-08-30 LAB — CBC AND DIFFERENTIAL
HCT: 37 (ref 36–46)
Hemoglobin: 12.2 (ref 12.0–16.0)
Platelets: 230 (ref 150–399)

## 2018-09-13 ENCOUNTER — Other Ambulatory Visit (HOSPITAL_COMMUNITY): Payer: Self-pay | Admitting: Neurology

## 2018-09-13 ENCOUNTER — Other Ambulatory Visit: Payer: Self-pay | Admitting: Neurology

## 2018-09-13 DIAGNOSIS — G250 Essential tremor: Secondary | ICD-10-CM

## 2018-09-14 ENCOUNTER — Ambulatory Visit: Payer: Medicare Other | Admitting: Oncology

## 2018-09-14 ENCOUNTER — Ambulatory Visit (INDEPENDENT_AMBULATORY_CARE_PROVIDER_SITE_OTHER): Payer: Medicare Other | Admitting: Family Medicine

## 2018-09-14 ENCOUNTER — Telehealth: Payer: Self-pay | Admitting: Radiology

## 2018-09-14 ENCOUNTER — Other Ambulatory Visit: Payer: Medicare Other

## 2018-09-14 ENCOUNTER — Other Ambulatory Visit: Payer: Self-pay

## 2018-09-14 VITALS — BP 160/80 | HR 83 | Temp 98.5°F | Wt 170.6 lb

## 2018-09-14 DIAGNOSIS — I10 Essential (primary) hypertension: Secondary | ICD-10-CM | POA: Diagnosis not present

## 2018-09-14 DIAGNOSIS — N183 Chronic kidney disease, stage 3 unspecified: Secondary | ICD-10-CM

## 2018-09-14 DIAGNOSIS — Z794 Long term (current) use of insulin: Secondary | ICD-10-CM

## 2018-09-14 DIAGNOSIS — R251 Tremor, unspecified: Secondary | ICD-10-CM | POA: Diagnosis not present

## 2018-09-14 DIAGNOSIS — E1122 Type 2 diabetes mellitus with diabetic chronic kidney disease: Secondary | ICD-10-CM

## 2018-09-14 DIAGNOSIS — G4700 Insomnia, unspecified: Secondary | ICD-10-CM

## 2018-09-14 LAB — VITAMIN D 25 HYDROXY (VIT D DEFICIENCY, FRACTURES): VITD: 15.34 ng/mL — ABNORMAL LOW (ref 30.00–100.00)

## 2018-09-14 NOTE — Telephone Encounter (Signed)
Please place orders in order for labs to sent out

## 2018-09-14 NOTE — Telephone Encounter (Signed)
System down. Please place future orders. Thank you.

## 2018-09-15 ENCOUNTER — Ambulatory Visit: Payer: Medicare Other | Admitting: Family Medicine

## 2018-09-17 ENCOUNTER — Encounter: Payer: Self-pay | Admitting: Family Medicine

## 2018-09-17 NOTE — Assessment & Plan Note (Signed)
Discussed good sleep hygiene.  She will trial this prior to considering any medication treatment.

## 2018-09-17 NOTE — Assessment & Plan Note (Signed)
Decently well controlled for her age with a recent A1c of 7.2.  She will continue her current regimen.

## 2018-09-17 NOTE — Assessment & Plan Note (Signed)
She reports she has been following with nephrology.

## 2018-09-17 NOTE — Assessment & Plan Note (Signed)
Vitamin D to be checked.  She will complete evaluation through neurology.

## 2018-09-17 NOTE — Assessment & Plan Note (Signed)
Mostly well controlled.  She will continue to monitor.  She will continue her current regimen.

## 2018-09-17 NOTE — Progress Notes (Signed)
[name], MD Phone: [phone]  Kelsey Mason is a 78 y.o. female who presents today for f/u.  HYPERTENSION  Disease Monitoring  Home BP Monitoring 107-155/56-82, though the majority of her blood pressures are well controlled chest pain- no    Dyspnea- no Medications  Compliance-  Taking lasix, losartan, coreg.  Edema- no  DIABETES Disease Monitoring: Blood Sugar ranges-70-108 fasting, 201-300 in the PM, reports most recent A1c 7.2 polyuria/phagia/dipsia- some polydipsia   Optho- UTD Medications: Compliance- taking trulicity, lantus 44 u daily, glimeperide Hypoglycemic symptoms- occasionally in the am  Tremor: She has seen Dr. Manuella Ghazi.  She notes that they wanted a vitamin D checked.  Notes the tremor has been mild.  Notes her balance has been off.  They are proceeding with an MRI.  Sleep difficulty: Notes there are nights where she will get 7 hours of sleep and then other nights she will not sleep at all.  She watches TV in bed to try to go to sleep.  She watches TV the hour before bed.  No depression or anxiety.  No alcohol.  She has caffeine 1 time early in the day.  No medications to help her sleep.   Social History   Tobacco Use  Smoking Status Former Smoker  . Packs/day: 0.25  . Years: 1.00  . Pack years: 0.25  . Types: Cigarettes  . Last attempt to quit: 1963  . Years since quitting: 57.2  Smokeless Tobacco Never Used     ROS see history of present illness  Objective  Physical Exam Vitals:   09/17/18 0935  BP: (!) 160/80  Pulse: 83  Temp: 98.5 F (36.9 C)  SpO2: 99%    BP Readings from Last 3 Encounters:  09/17/18 (!) 160/80  05/18/18 119/78  05/16/18 (!) 152/68   Wt Readings from Last 3 Encounters:  09/17/18 170 lb 9.6 oz (77.4 kg)  05/18/18 170 lb 12 oz (77.5 kg)  05/16/18 171 lb 12.8 oz (77.9 kg)    Physical Exam Constitutional:      General: She is not in acute distress.    Appearance: She is not diaphoretic.   Cardiovascular:     Rate and Rhythm: Normal rate and regular rhythm.     Heart sounds: Normal heart sounds.  Pulmonary:     Effort: Pulmonary effort is normal.     Breath sounds: Normal breath sounds.  Skin:    General: Skin is warm and dry.  Neurological:     Mental Status: She is alert.     Comments: Mild tremor noted with her holding her hands out      Assessment/Plan: Please see individual problem list.  Essential hypertension Mostly well controlled.  She will continue to monitor.  She will continue her current regimen.  Type 2 diabetes mellitus with stage 3 chronic kidney disease, with long-term current use of insulin ([place]) Decently well controlled for her age with a recent A1c of 7.2.  She will continue her current regimen.  Chronic kidney disease, stage III (moderate) ([facility]) She reports she has been following with nephrology.  Tremor Vitamin D to be checked.  She will complete evaluation through neurology.  Insomnia Discussed good sleep hygiene.  She will trial this prior to considering any medication treatment.   Orders Placed This Encounter  Procedures  . Vitamin D (25 hydroxy)    Standing Status:   Future    Number of Occurrences:   1    Standing Expiration Date:  09/14/2019    No orders of the defined types were placed in this encounter.    [name], MD [place]

## 2018-09-18 ENCOUNTER — Encounter: Payer: Self-pay | Admitting: Oncology

## 2018-09-18 ENCOUNTER — Inpatient Hospital Stay: Payer: Medicare Other | Attending: Oncology

## 2018-09-18 ENCOUNTER — Inpatient Hospital Stay: Payer: Medicare Other | Admitting: Oncology

## 2018-09-18 ENCOUNTER — Telehealth: Payer: Self-pay

## 2018-09-18 ENCOUNTER — Other Ambulatory Visit: Payer: Self-pay

## 2018-09-18 ENCOUNTER — Other Ambulatory Visit: Payer: Self-pay | Admitting: Family Medicine

## 2018-09-18 VITALS — BP 160/85 | HR 87 | Temp 98.4°F | Resp 18 | Wt 167.4 lb

## 2018-09-18 DIAGNOSIS — M8580 Other specified disorders of bone density and structure, unspecified site: Secondary | ICD-10-CM

## 2018-09-18 DIAGNOSIS — E119 Type 2 diabetes mellitus without complications: Secondary | ICD-10-CM

## 2018-09-18 DIAGNOSIS — Z87891 Personal history of nicotine dependence: Secondary | ICD-10-CM | POA: Diagnosis not present

## 2018-09-18 DIAGNOSIS — I10 Essential (primary) hypertension: Secondary | ICD-10-CM

## 2018-09-18 DIAGNOSIS — Z79811 Long term (current) use of aromatase inhibitors: Secondary | ICD-10-CM

## 2018-09-18 DIAGNOSIS — D649 Anemia, unspecified: Secondary | ICD-10-CM

## 2018-09-18 DIAGNOSIS — C50912 Malignant neoplasm of unspecified site of left female breast: Secondary | ICD-10-CM | POA: Diagnosis not present

## 2018-09-18 DIAGNOSIS — Z7982 Long term (current) use of aspirin: Secondary | ICD-10-CM

## 2018-09-18 DIAGNOSIS — E559 Vitamin D deficiency, unspecified: Secondary | ICD-10-CM

## 2018-09-18 DIAGNOSIS — Z79899 Other long term (current) drug therapy: Secondary | ICD-10-CM | POA: Insufficient documentation

## 2018-09-18 DIAGNOSIS — Z923 Personal history of irradiation: Secondary | ICD-10-CM | POA: Insufficient documentation

## 2018-09-18 DIAGNOSIS — Z794 Long term (current) use of insulin: Secondary | ICD-10-CM | POA: Diagnosis not present

## 2018-09-18 DIAGNOSIS — Z170 Estrogen receptor positive status [ER+]: Secondary | ICD-10-CM | POA: Insufficient documentation

## 2018-09-18 DIAGNOSIS — C50911 Malignant neoplasm of unspecified site of right female breast: Secondary | ICD-10-CM | POA: Diagnosis not present

## 2018-09-18 DIAGNOSIS — Z9221 Personal history of antineoplastic chemotherapy: Secondary | ICD-10-CM | POA: Diagnosis not present

## 2018-09-18 DIAGNOSIS — Z853 Personal history of malignant neoplasm of breast: Secondary | ICD-10-CM

## 2018-09-18 LAB — COMPREHENSIVE METABOLIC PANEL
ALT: 16 U/L (ref 0–44)
AST: 19 U/L (ref 15–41)
Albumin: 4.7 g/dL (ref 3.5–5.0)
Alkaline Phosphatase: 93 U/L (ref 38–126)
Anion gap: 6 (ref 5–15)
BUN: 14 mg/dL (ref 8–23)
CO2: 25 mmol/L (ref 22–32)
Calcium: 9.1 mg/dL (ref 8.9–10.3)
Chloride: 102 mmol/L (ref 98–111)
Creatinine, Ser: 1.3 mg/dL — ABNORMAL HIGH (ref 0.44–1.00)
GFR calc Af Amer: 46 mL/min — ABNORMAL LOW (ref >60)
GFR calc non Af Amer: 40 mL/min — ABNORMAL LOW (ref >60)
Glucose, Bld: 136 mg/dL — ABNORMAL HIGH (ref 70–99)
Potassium: 4.4 mmol/L (ref 3.5–5.1)
Sodium: 133 mmol/L — ABNORMAL LOW (ref 135–145)
Total Bilirubin: 0.7 mg/dL (ref 0.3–1.2)
Total Protein: 7.3 g/dL (ref 6.5–8.1)

## 2018-09-18 LAB — CBC WITH DIFFERENTIAL/PLATELET
Abs Immature Granulocytes: 0.06 10*3/uL (ref 0.00–0.07)
Basophils Absolute: 0.1 10*3/uL (ref 0.0–0.1)
Basophils Relative: 1 %
Eosinophils Absolute: 0.9 10*3/uL — ABNORMAL HIGH (ref 0.0–0.5)
Eosinophils Relative: 8 %
HCT: 40.5 % (ref 36.0–46.0)
Hemoglobin: 13.7 g/dL (ref 12.0–15.0)
Immature Granulocytes: 1 %
Lymphocytes Relative: 15 %
Lymphs Abs: 1.6 10*3/uL (ref 0.7–4.0)
MCH: 29.5 pg (ref 26.0–34.0)
MCHC: 33.8 g/dL (ref 30.0–36.0)
MCV: 87.3 fL (ref 80.0–100.0)
Monocytes Absolute: 0.6 10*3/uL (ref 0.1–1.0)
Monocytes Relative: 5 %
Neutro Abs: 7.9 10*3/uL — ABNORMAL HIGH (ref 1.7–7.7)
Neutrophils Relative %: 70 %
Platelets: 229 10*3/uL (ref 150–400)
RBC: 4.64 MIL/uL (ref 3.87–5.11)
RDW: 12.2 % (ref 11.5–15.5)
WBC: 11.1 10*3/uL — ABNORMAL HIGH (ref 4.0–10.5)
nRBC: 0 % (ref 0.0–0.2)

## 2018-09-18 MED ORDER — VITAMIN D (ERGOCALCIFEROL) 1.25 MG (50000 UNIT) PO CAPS: 50000.0000 [IU] | ORAL_CAPSULE | ORAL | 0 refills | Status: DC

## 2018-09-18 NOTE — Progress Notes (Signed)
[facility] day:  09/18/2018  Chief Complaint: Kelsey Mason is a 78 y.o. female with bilateral stage IA breast cancer who is seen for 6 month assessment on Femara.  PERTINENT ONCOLOGY HISTORY Kelsey Mason is a 78 y.o.afemale who has above oncology history reviewed by me today presented for follow up visit for management of history of bilateral breast cancer. Patient previously follows up with Dr. Mike Gip. Switched care to me on 09/18/2018. Medical record review was performed by me.  bilateral breast cancer s/p lumpectomy and sentinel lymph node biopsy on 11/17/2012.  Right breast revealed a 5 mm grade I invasive carcinoma.  There was no DCIS.  Two sentinel lymph nodes were negative.  Pathologic stage was T1aN0M0.  Tumor was ER + (90%), PR + (20%), and Her2/neu -.   Left breast revealed a 1.8 cm grade III invasive ductal carcinoma.  There was lymphovascular invasion.  There was no DCIS.  One sentinel lymph node was negative.  Pathologic stage was T1cN0M0.  Tumor was ER + (>90%), PR + (1-5%), and Her2/neu -.  Oncotype DX testing on the left breast mass revealed a recurrence score of 28 which corresponded to a 10 year risk of distant recurrence of 19% (CI 15-23%) with tamoxifen alone.  BCI testing on 10/04/2017 revealed a high risk of late recurrence (5.4%; CI 2.5%-8.2%) during years 5-10.  There was a low likelihood of benefit from extended endocrine therapy.  She received Adriamycin and Cytoxan (AC) x 4 cycles followed by weekly Taxol x 10 (completed 06/05/2013).  She did not receive 2 cycles of Taxol secondary to progressive neuropathy.  She received bilateral breast radiation (07/2013 - 09/2013).  She began letrozole (Femara) in 09/2013.   Bilateral diagnostic mammogram on 03/22/2016 revealed  no evidence of recurrent disease.  Bilateral diagnostic mammogram on 03/31/2017 revealed no evidence of malignancy.  CA27.29 has been  followed:  24.4 on 09/10/2016, 28.1 on 03/14/2017, 24.9 on 09/12/2017, and 22.8 on 03/13/2018.  Bone density on 03/21/2015 was normal with a T score of -0.5 in L1-L4 and -0.8 in the left femur.  She has a history of a borderline mild normocytic anemia.  Diet appears modest.  She denies any melena, hematochezia, hematuria or vaginal bleeding. Colonoscopy on 10/13/2015 was negative.    Soft tissue neck ultrasound on 03/16/2017 revealed no fluid collection or soft tissue lesion.   INTERVAL HISTORY Kelsey Mason is a 78 y.o. female who has above history reviewed by me today presents for follow up visit for management of bilateral breast cancer. Problems and complaints are listed below: Patient reports doing satisfactorily.  She takes letrozole every day. Denies any new complaints.  No particular concern about her breast Last mammogram was done on 04/03/2018 with benign findings.  Review of Systems  Constitutional: Negative for appetite change, chills, fatigue and fever.  HENT:   Negative for hearing loss and voice change.   Eyes: Negative for eye problems.  Respiratory: Negative for chest tightness and cough.   Cardiovascular: Negative for chest pain.  Gastrointestinal: Negative for abdominal distention, abdominal pain and blood in stool.  Endocrine: Negative for hot flashes.  Genitourinary: Negative for difficulty urinating and frequency.   Musculoskeletal: Negative for arthralgias.  Skin: Negative for itching and rash.  Neurological: Negative for extremity weakness.  Hematological: Negative for adenopathy.  Psychiatric/Behavioral: Negative for confusion.     Past Medical History:  Diagnosis Date  . Breast symptom 2014  . Cancer (  [place]) 2014   Bilateral Breast, chemo Dr. Jeb Levering  . Diabetes mellitus   . GERD (gastroesophageal reflux disease)    mild  . Hypertension   . Personal history of chemotherapy   . Personal history of radiation therapy 2014   bilat lumpectomy     Past Surgical History:  Procedure Laterality Date  . ABDOMINAL AORTOGRAM W/LOWER EXTREMITY N/A 04/21/2018   Procedure: ABDOMINAL AORTOGRAM W/LOWER EXTREMITY;  Surgeon: Nelva Bush, MD;  Location: [facility];  Service: Cardiovascular;  Laterality: N/A;  . ABDOMINAL HYSTERECTOMY  1976   for pelvic pain  . BREAST EXCISIONAL BIOPSY Bilateral 11/2012   bilat breast ca chemo and rad  . BREAST SURGERY Bilateral 2014   bilateral lumpectomy and SN biopsy  . CHOLECYSTECTOMY  2007  . COLONOSCOPY WITH PROPOFOL N/A 10/13/2015   Procedure: COLONOSCOPY WITH PROPOFOL;  Surgeon: Manya Silvas, MD;  Location: [facility];  Service: Endoscopy;  Laterality: N/A;  . LEFT HEART CATH AND CORONARY ANGIOGRAPHY N/A 04/21/2018   Procedure: LEFT HEART CATH AND CORONARY ANGIOGRAPHY;  Surgeon: Nelva Bush, MD;  Location: [facility];  Service: Cardiovascular;  Laterality: N/A;  . OOPHORECTOMY Right   . PORTACATH PLACEMENT  2014  . TUMOR REMOVAL Right 1980   right foot    Family History  Problem Relation Age of Onset  . Alcohol abuse Mother   . Heart disease Mother   . Hypertension Mother   . Breast cancer Mother 30  . Alcohol abuse Father   . Heart disease Father   . Hypertension Father   . Heart disease Brother   . Heart attack Brother    Social History   Socioeconomic History  . Marital status: Widowed    Spouse name: Not on file  . Number of children: Not on file  . Years of education: Not on file  . Highest education level: Not on file  Occupational History  . Not on file  Social Needs  . Financial resource strain: Not hard at all  . Food insecurity:    Worry: Not on file    Inability: Not on file  . Transportation needs:    Medical: No    Non-medical: No  Tobacco Use  . Smoking status: Former Smoker    Packs/day: 0.25    Years: 1.00    Pack years: 0.25    Types: Cigarettes    Last attempt to quit: 1963    Years since quitting: 57.2  . Smokeless  tobacco: Never Used  Substance and Sexual Activity  . Alcohol use: No    Alcohol/week: 0.0 standard drinks  . Drug use: No  . Sexual activity: Never  Lifestyle  . Physical activity:    Days per week: 2 days    Minutes per session: 150+ min  . Stress: Not at all  Relationships  . Social connections:    Talks on phone: Not on file    Gets together: Not on file    Attends religious service: Not on file    Active member of club or organization: Not on file    Attends meetings of clubs or organizations: Not on file    Relationship status: Not on file  . Intimate partner violence:    Fear of current or ex partner: Not on file    Emotionally abused: Not on file    Physically abused: Not on file    Forced sexual activity: Not on file  Other Topics Concern  . Not  on file  Social History Narrative   Lives in [place].       Works - [employer]   Diet - regular   Exercise - dance 3x per week     She lives by herself in [place].  Husband passed away many years ago.  The patient is alone today.  Allergies:  Allergies  Allergen Reactions  . Advil [Ibuprofen] Other (See Comments)    Dizziness   . Aleve [Naproxen] Other (See Comments)    Dizziness  . Fluticasone-Salmeterol Other (See Comments)    dizziness  . Lipitor [Atorvastatin] Other (See Comments)    Legs weak.  Leg pain, muscle ache  . Naproxen Sodium Other (See Comments)  . Penicillins Hives and Other (See Comments)    Has patient had a PCN reaction causing immediate rash, facial/tongue/throat swelling, SOB or lightheadedness with hypotension: YES Has patient had a PCN reaction causing severe rash involving mucus membranes or skin necrosis: no Has patient had a PCN reaction that required hospitalization: no Has patient had a PCN reaction occurring within the last 10 years: no If all of the above answers are "NO", then may proceed with Cephalosporin use.     Current Medications: Current Outpatient Medications   Medication Sig Dispense Refill  . aspirin 81 MG tablet Take 81 mg by mouth daily.    . blood glucose meter kit and supplies KIT Dispense based on patient and insurance preference. Use up to four times daily as directed. One Touch Ultra mini meter (FOR ICD-9 250.00, 250.01). Dx: E11.9 1 each 0  . carvedilol (COREG) 3.125 MG tablet TAKE 1 TABLET (3.125 MG TOTAL) BY MOUTH 2 (TWO) TIMES DAILY.    . furosemide (LASIX) 20 MG tablet Take 20 mg by mouth daily.    [name] glimepiride (AMARYL) 2 MG tablet TAKE 1 TABLET (2 MG TOTAL) BY MOUTH 2 (TWO) TIMES DAILY. 180 tablet 1  . glucose blood (ONE TOUCH ULTRA TEST) test strip USE TO TEST BLOOD SUGAR UP TO 4 TIMES DAILY 100 each 6  . LANTUS SOLOSTAR 100 UNIT/ML Solostar Pen INJECT 20 UNITS INTO THE SKIN DAILY AT 10 PM. (Patient taking differently: Inject 44 Units into the skin at bedtime. ) 18 pen 0  . letrozole (FEMARA) 2.5 MG tablet TAKE 1 TABLET BY MOUTH EVERY DAY 90 tablet 2  . losartan (COZAAR) 50 MG tablet Take 50 mg by mouth 2 (two) times daily.    [name] NOVOFINE 32G X 6 MM MISC USE AS DIRECTED WITH LANTUS PEN 100 each 2  . rosuvastatin (CRESTOR) 5 MG tablet Take 5 mg by mouth daily.    . TRULICITY 1.5 QP/6.1PJ SOPN Inject 1.5 mg as directed once a week.  11  . carvedilol (COREG) 12.5 MG tablet Take 1 tablet (12.5 mg total) by mouth 2 (two) times daily. 180 tablet 3  . furosemide (LASIX) 20 MG tablet Take 1 tablet (20 mg total) by mouth daily. 90 tablet 3  . loratadine-pseudoephedrine (CLARITIN-D 24-HOUR) 10-240 MG 24 hr tablet Take 1 tablet by mouth daily.    . rosuvastatin (CRESTOR) 5 MG tablet Take 1 tablet (5 mg total) by mouth daily. 90 tablet 3  . vitamin B-12 (CYANOCOBALAMIN) 1000 MCG tablet Take 1,000 mcg by mouth daily.    . Vitamin D, Ergocalciferol, (DRISDOL) 1.25 MG (50000 UT) CAPS capsule Take 1 capsule (50,000 Units total) by mouth every 7 (seven) days. (Patient not taking: Reported on 09/18/2018) 8 capsule 0   No current facility-administered  medications for this visit.    Physical Exam: Blood pressure (!) 160/85, pulse 87, temperature 98.4 F (36.9 C), temperature source Tympanic, resp. rate 18, weight 167 lb 6.4 oz (75.9 kg). Physical Exam  Constitutional: She is oriented to person, place, and time. No distress.  HENT:  Head: Normocephalic and atraumatic.  Nose: Nose normal.  Mouth/Throat: Oropharynx is clear and moist. No oropharyngeal exudate.  Eyes: Pupils are equal, round, and reactive to light. EOM are normal. No scleral icterus.  Neck: Normal range of motion. Neck supple.  Cardiovascular: Normal rate and regular rhythm.  No murmur heard. Pulmonary/Chest: Effort normal. No respiratory distress. She has no rales. She exhibits no tenderness.  Abdominal: Soft. She exhibits no distension. There is no abdominal tenderness.  Musculoskeletal: Normal range of motion.        General: No edema.  Neurological: She is alert and oriented to person, place, and time. No cranial nerve deficit. She exhibits normal muscle tone. Coordination normal.  Skin: Skin is warm and dry. She is not diaphoretic. No erythema.  Psychiatric: Affect normal.   Breast exam was performed in seated and lying down position. Patient is status post bilateral lumpectomy with well-healed surgical scar.  Bilateral chronic nipple retraction  no evidence of any palpable masses. No evidence of axillary adenopathy bilaterally.    Appointment on 09/18/2018  Component Date Value Ref Range Status  . Sodium 09/18/2018 133* 135 - 145 mmol/L Final  . Potassium 09/18/2018 4.4  3.5 - 5.1 mmol/L Final  . Chloride 09/18/2018 102  98 - 111 mmol/L Final  . CO2 09/18/2018 25  22 - 32 mmol/L Final  . Glucose, Bld 09/18/2018 136* 70 - 99 mg/dL Final  . BUN 09/18/2018 14  8 - 23 mg/dL Final  . Creatinine, Ser 09/18/2018 1.30* 0.44 - 1.00 mg/dL Final  . Calcium 09/18/2018 9.1  8.9 - 10.3 mg/dL Final  . Total Protein 09/18/2018 7.3  6.5 - 8.1 g/dL Final  . Albumin  09/18/2018 4.7  3.5 - 5.0 g/dL Final  . AST 09/18/2018 19  15 - 41 U/L Final  . ALT 09/18/2018 16  0 - 44 U/L Final  . Alkaline Phosphatase 09/18/2018 93  38 - 126 U/L Final  . Total Bilirubin 09/18/2018 0.7  0.3 - 1.2 mg/dL Final  . GFR calc non Af Amer 09/18/2018 40* >60 mL/min Final  . GFR calc Af Amer 09/18/2018 46* >60 mL/min Final  . Anion gap 09/18/2018 6  5 - 15 Final   Performed at [facility], [address] [place], [place] [address]  . WBC 09/18/2018 11.1* 4.0 - 10.5 K/uL Final  . RBC 09/18/2018 4.64  3.87 - 5.11 MIL/uL Final  . Hemoglobin 09/18/2018 13.7  12.0 - 15.0 g/dL Final  . HCT 09/18/2018 40.5  36.0 - 46.0 % Final  . MCV 09/18/2018 87.3  80.0 - 100.0 fL Final  . MCH 09/18/2018 29.5  26.0 - 34.0 pg Final  . MCHC 09/18/2018 33.8  30.0 - 36.0 g/dL Final  . RDW 09/18/2018 12.2  11.5 - 15.5 % Final  . Platelets 09/18/2018 229  150 - 400 K/uL Final  . nRBC 09/18/2018 0.0  0.0 - 0.2 % Final  . Neutrophils Relative % 09/18/2018 70  % Final  . Neutro Abs 09/18/2018 7.9* 1.7 - 7.7 K/uL Final  . Lymphocytes Relative 09/18/2018 15  % Final  . Lymphs Abs 09/18/2018 1.6  0.7 - 4.0 K/uL Final  . Monocytes Relative 09/18/2018 5  %  Final  . Monocytes Absolute 09/18/2018 0.6  0.1 - 1.0 K/uL Final  . Eosinophils Relative 09/18/2018 8  % Final  . Eosinophils Absolute 09/18/2018 0.9* 0.0 - 0.5 K/uL Final  . Basophils Relative 09/18/2018 1  % Final  . Basophils Absolute 09/18/2018 0.1  0.0 - 0.1 K/uL Final  . Immature Granulocytes 09/18/2018 1  % Final  . Abs Immature Granulocytes 09/18/2018 0.06  0.00 - 0.07 K/uL Final   Performed at [facility], [address], [place], [place] [address]    Assessment:  Kelsey Mason is a 78 y.o. female presents for follow-up of bilateral stage I breast cancer.  1. History of breast cancer   2. Aromatase inhibitor use   3. Osteopenia, unspecified location   Patient has finished 5 years of aromatase inhibitor. We  discussed about her BCI test results with 5.4% risk of recurrence at a year between 5-10 with low likelihood of benefit. Patient appears tolerating letrozole well and desires to continue.  I think it is reasonable to continue especially if she is tolerating it. Continue monitor. Annual mammogram in September 2020.  Bone density 10/18/2017 was reviewed T score -2.3, patient is considered osteopenic according to WHO criteria. Recommend patient to start taking calcium 1000 mg daily with vitamin D supplements. Patient has had vitamin D levels checked recently by primary care physician.  Vitamin D level is low.  Recommend patient to start vitamin D supplements.  Bisphosphonate or Prolia can be considered.  Recommend patient to obtain dental clearance prior to starting bisphosphonate or Prolia.  Patient informs me that she has not seen any dentist for the past 5 years.  Will rediscuss at the next visit.  #History of low B12, B12 was not checked at this visit.  B12 level on 04/13/2018 showed elevated B12 levels.  Will repeat B12 at the next visit.  #Chronic leukocytosis with predominant neutrophilia.  Possible reactive.  Continue to monitor.  Repeat testing 6 months  RTC in 6 months for MD assessment and labs (CBC with diff, CMP, CA27.29 B12).  Earlie Server, MD, PhD Hematology Oncology [facility] Pager- [phone] 09/18/2018

## 2018-09-18 NOTE — Telephone Encounter (Signed)
I believe this was for a BP check with nursing. This can be scheduled.

## 2018-09-18 NOTE — Addendum Note (Signed)
Addended by: Earlie Server on: 09/18/2018 04:49 PM   Modules accepted: Orders

## 2018-09-18 NOTE — Progress Notes (Signed)
Patient here for follow up. Last time she was here, her B12 level was low. She was taking oral b12 supplement but ran out and is not sure wether she should continue to take.

## 2018-09-18 NOTE — Telephone Encounter (Signed)
Copied from [place] [id number]. Topic: Appointment Scheduling - Scheduling Inquiry for Clinic >> Sep 18, 2018 10:04 AM Kelsey Mason wrote: Reason for CRM: Pt stated that at her OV on 09/14/18 Dr. Caryl Bis told her to come back in 2 weeks. First OV is towards the end of June. Please advise. CB#[phone]

## 2018-09-18 NOTE — Telephone Encounter (Signed)
Sent to PCP to advised when we could see pt sooner.

## 2018-09-19 ENCOUNTER — Other Ambulatory Visit: Payer: Self-pay

## 2018-09-19 LAB — CANCER ANTIGEN 27.29: CA 27.29: 32.1 U/mL (ref 0.0–38.6)

## 2018-09-19 MED ORDER — ROSUVASTATIN CALCIUM 5 MG PO TABS: 5.0000 mg | ORAL_TABLET | Freq: Every day | ORAL | 0 refills | Status: DC

## 2018-09-19 NOTE — Telephone Encounter (Signed)
Pt advised.

## 2018-09-19 NOTE — Telephone Encounter (Signed)
Noted. This is reasonable. Patient should contact us with her readings in 2 weeks. Thanks.

## 2018-09-19 NOTE — Telephone Encounter (Signed)
Called and spoke with patient. Pt advised and voiced understanding. Pt would like to hold off on appt due to coronavirus and having allergy like symptoms. Pt advised to keep track of her BP at home and call us with her reading and see if next month we could schedule her for a NV to check on BP it needed.   Sent to PCP as an [place]

## 2018-09-20 ENCOUNTER — Ambulatory Visit
Admission: RE | Admit: 2018-09-20 | Discharge: 2018-09-20 | Disposition: A | Discharge status: Home / Self Care | Payer: Medicare Other | Source: Ambulatory Visit | Attending: Neurology | Admitting: Neurology

## 2018-09-20 ENCOUNTER — Other Ambulatory Visit: Payer: Self-pay

## 2018-09-20 DIAGNOSIS — G250 Essential tremor: Secondary | ICD-10-CM

## 2018-10-25 ENCOUNTER — Other Ambulatory Visit: Payer: Self-pay | Admitting: *Deleted

## 2018-10-25 MED ORDER — LOSARTAN POTASSIUM 50 MG PO TABS: 50.0000 mg | ORAL_TABLET | Freq: Two times a day (BID) | ORAL | 0 refills | Status: DC

## 2018-10-27 ENCOUNTER — Telehealth: Payer: Self-pay | Admitting: *Deleted

## 2018-10-27 NOTE — Telephone Encounter (Signed)
       Virtual Visit Pre-Appointment Phone Call  "(Name), I am calling you today to discuss your upcoming appointment. We are currently trying to limit exposure to the virus that causes COVID-19 by seeing patients at home rather than in the office."  1. "What is the BEST phone number to call the day of the visit?" - include this in appointment notes  [phone]  2. "Do you have or have access to (through a family member/friend) a smartphone with video capability that we can use for your visit?" Patient does not have anyone with smartphone who would be available.   3. Confirm consent - "In the setting of the current Covid19 crisis, you are scheduled for a (phone or video) visit with your provider on (date) at (time).  Just as we do with many in-office visits, in order for you to participate in this visit, we must obtain consent.  If you'd like, I can send this to your mychart (if signed up) or email for you to review.  Otherwise, I can obtain your verbal consent now.  All virtual visits are billed to your insurance company just like a normal visit would be.  By agreeing to a virtual visit, we'd like you to understand that the technology does not allow for your provider to perform an examination, and thus may limit your provider's ability to fully assess your condition. If your provider identifies any concerns that need to be evaluated in person, we will make arrangements to do so.  Finally, though the technology is pretty good, we cannot assure that it will always work on either your or our end, and in the setting of a video visit, we may have to convert it to a phone-only visit.  In either situation, we cannot ensure that we have a secure connection.  Are you willing to proceed?" STAFF: Did the patient verbally acknowledge consent to telehealth visit? Document YES/NO here: yes  4. Advise patient to be prepared - "Two hours prior to your appointment, go ahead and check your blood pressure, pulse,  oxygen saturation, and your weight (if you have the equipment to check those) and write them all down. When your visit starts, your provider will ask you for this information. If you have an Apple Watch or Kardia device, please plan to have heart rate information ready on the day of your appointment. Please have a pen and paper handy nearby the day of the visit as well."  5. Give patient instructions for MyChart download to smartphone OR [email or web address] as below if video visit (depending on what platform provider is using)  6. Inform patient they will receive a phone call 15 minutes prior to their appointment time (may be from unknown caller ID) so they should be prepared to answer    TELEPHONE CALL NOTE  Kelsey Mason has been deemed a candidate for a follow-up tele-health visit to limit community exposure during the Covid-19 pandemic. I spoke with the patient via phone to ensure availability of phone/video source, confirm preferred email & phone number, and discuss instructions and expectations.  I reminded Kelsey Mason to be prepared with any vital sign and/or heart rhythm information that could potentially be obtained via home monitoring, at the time of her visit. I reminded Kelsey Mason to expect a phone call prior to her visit.  Roney Jaffe, RN 10/27/2018 2:26 PM

## 2018-11-08 NOTE — Progress Notes (Deleted)
{Choose 1 Note Type (Telehealth Visit or Telephone Visit):[id number]}   Date:  11/08/2018   ID:  Kelsey Mason, DOB 1940/10/27, MRN [id number]  Patient Location: Home Provider Location: Office  PCP:  Leone Haven, MD  Cardiologist:  Nelva Bush, MD  Electrophysiologist:  None   Evaluation Performed:  Follow-Up Visit  Chief Complaint:  ***  History of Present Illness:    Kelsey Mason is a 78 y.o. female with history of nonobstructive coronary artery disease, PAD with small vessel disease involving the runoff vessels, nonischemic cardiomyopathy with normalization of LVEF, mitral regurgitation, hypertension, hyperlipidemia, type 2 diabetes mellitus,mild-moderate renal artery stenosis,and breast cancer.  I last saw her in 05/2018, at which time she reported stable dyspnea on exertion.  She noted that her prior leg pain resolved without intervention.  We did not make any medication changes or pursue further testing at that time.  The patient {does/does not:200015} have symptoms concerning for COVID-19 infection (fever, chills, cough, or new shortness of breath).    Past Medical History:  Diagnosis Date  . Breast symptom 2014  . Cancer [facility]) 2014   Bilateral Breast, chemo Dr. Jeb Levering  . Diabetes mellitus   . GERD (gastroesophageal reflux disease)    mild  . Hypertension   . Personal history of chemotherapy   . Personal history of radiation therapy 2014   bilat lumpectomy   Past Surgical History:  Procedure Laterality Date  . ABDOMINAL AORTOGRAM W/LOWER EXTREMITY N/A 04/21/2018   Procedure: ABDOMINAL AORTOGRAM W/LOWER EXTREMITY;  Surgeon: Nelva Bush, MD;  Location: [place] [facility];  Service: Cardiovascular;  Laterality: N/A;  . ABDOMINAL HYSTERECTOMY  1976   for pelvic pain  . BREAST EXCISIONAL BIOPSY Bilateral 11/2012   bilat breast ca chemo and rad  . BREAST SURGERY Bilateral 2014   bilateral lumpectomy and SN biopsy  . CHOLECYSTECTOMY  2007   . COLONOSCOPY WITH PROPOFOL N/A 10/13/2015   Procedure: COLONOSCOPY WITH PROPOFOL;  Surgeon: Manya Silvas, MD;  Location: [facility] ENDOSCOPY;  Service: Endoscopy;  Laterality: N/A;  . LEFT HEART CATH AND CORONARY ANGIOGRAPHY N/A 04/21/2018   Procedure: LEFT HEART CATH AND CORONARY ANGIOGRAPHY;  Surgeon: Nelva Bush, MD;  Location: [place] [facility];  Service: Cardiovascular;  Laterality: N/A;  . OOPHORECTOMY Right   . PORTACATH PLACEMENT  2014  . TUMOR REMOVAL Right 1980   right foot     No outpatient medications have been marked as taking for the 11/10/18 encounter (Appointment) with Gedalya Jim, Harrell Gave, MD.     Allergies:   Advil [ibuprofen]; Aleve [naproxen]; Fluticasone-salmeterol; Lipitor [atorvastatin]; Naproxen sodium; and Penicillins   Social History   Tobacco Use  . Smoking status: Former Smoker    Packs/day: 0.25    Years: 1.00    Pack years: 0.25    Types: Cigarettes    Last attempt to quit: 1963    Years since quitting: 57.3  . Smokeless tobacco: Never Used  Substance Use Topics  . Alcohol use: No    Alcohol/week: 0.0 standard drinks  . Drug use: No     Family Hx: The patient's family history includes Alcohol abuse in her father and mother; Breast cancer (age of onset: 67) in her mother; Heart attack in her brother; Heart disease in her brother, father, and mother; Hypertension in her father and mother.  ROS:   Please see the history of present illness.    *** All other systems reviewed and are negative.   Prior CV studies:  The following studies were reviewed today:   Cath/PCI:  LHC (04/21/2018): LMCA normal.  LAD with 50% mid vessel stenosis.  LCx proper without disease.  Dominant OM3 with 50-60% proximal stenosis.  RCA with 50% ostial stenosis.  Mildly elevated LVEDP (15 to 20 mmHg).  Abdominal aortogram and runoff (04/21/2018): Aorta, inflow vessels, and outflow vessels.  There is tapering/occlusion of the distal anterior tibial and peroneal  arteries on the right.  There is three-vessel runoff on the left.  CV Surgery:  None  EP Procedures and Devices:  None  Non-Invasive Evaluation(s):  TTE (03/14/18): Mildly dilated LV with LVEF 40-45%. Global hypokinesis with grade 2 diastolic dysfunction. Moderate MR and mild LA enlargement. Normal RV size and function.  ABIs (02/22/2018): Noncompressible runoff vessels bilaterally with triphasic waveforms. TBI's are normal bilaterally.  Renal artery Doppler (04/20/17): Mild to moderate (less than 60%) stenosis of the right renal artery. No significant left renal artery disease.  TTE (01/14/17): Normal LV size with LVEF 50-55%. Anteroseptal hypokinesis is noted, as well as grade 1 diastolic dysfunction. Mild to moderate MR. Normal RV size and function. Mild pulmonary hypertension.  Pharmacologic myocardial perfusion stress test (06/16/16): Low risk study with moderate in size, moderate in severity, fixed defect involving the septum most likely related to LBBB. No evidence of ischemia. LVEF 50% by automated calculation and likely higher based on visual estimation.  ABIs (06/10/16): ABIs: Right not obtainable, left 1.4. TBIs right 1.1, left not obtainable. Bilateral great toe PPG's are normal. Bilateral common femoral, popliteal, peroneal, anterior tibial, and posterior tibial artery waveforms are brisk and triphasic.  TTE (02/23/16, [facility]): Mildly dilated left ventricle with mild to moderate LV dysfunction (EF 35-45%). Grade 1 diastolic dysfunction. Moderate mitral regurgitation. Mild left atrial enlargement. Normal RV function.  TTE (01/01/14, [facility]): Moderate to severe LV dysfunction (EF 30%) with mild LVH and mild left ventricular dilation. Mitral annular calcification with moderate MR and moderate TR noted. Normal right ventricular contraction.  MUGA (12/25/12): Normal contraction and wall motion. EF 63%.  Labs/Other Tests and Data Reviewed:    EKG:  No ECG  reviewed.  Recent Labs: 05/16/2018: TSH 5.02 09/18/2018: ALT 16; BUN 14; Creatinine, Ser 1.30; Hemoglobin 13.7; Platelets 229; Potassium 4.4; Sodium 133   Recent Lipid Panel Lab Results  Component Value Date/Time   CHOL 134 11/25/2017 08:42 AM   TRIG 96 11/25/2017 08:42 AM   HDL 45 11/25/2017 08:42 AM   CHOLHDL 3.0 11/25/2017 08:42 AM   LDLCALC 70 11/25/2017 08:42 AM   LDLDIRECT 178.7 10/02/2012 09:27 AM    Wt Readings from Last 3 Encounters:  09/18/18 167 lb 6.4 oz (75.9 kg)  09/17/18 170 lb 9.6 oz (77.4 kg)  05/18/18 170 lb 12 oz (77.5 kg)     Objective:    Vital Signs:  There were no vitals taken for this visit.   {[facility] Virtual Exam (Optional):[phone]::"VITAL SIGNS:  reviewed"}  ASSESSMENT & PLAN:    1. ***  COVID-19 Education: The signs and symptoms of COVID-19 were discussed with the patient and how to seek care for testing (follow up with PCP or arrange E-visit).  ***The importance of social distancing was discussed today.  Time:   Today, I have spent *** minutes with the patient with telehealth technology discussing the above problems.     Medication Adjustments/Labs and Tests Ordered: Current medicines are reviewed at length with the patient today.  Concerns regarding medicines are outlined above.   Tests Ordered: No orders of the  defined types were placed in this encounter.   Medication Changes: No orders of the defined types were placed in this encounter.   Disposition:  Follow up {follow up:15908}  Signed, Nelva Bush, MD  11/08/2018 3:54 PM    [facility]

## 2018-11-09 ENCOUNTER — Other Ambulatory Visit: Payer: Self-pay | Admitting: *Deleted

## 2018-11-09 ENCOUNTER — Telehealth: Payer: Medicare Other | Admitting: Internal Medicine

## 2018-11-09 NOTE — Telephone Encounter (Signed)
Patient is scheduled for virtual visit tomorrow.  I will clarify dose at that time and send in new Rx if appropriate.  Thanks.  Nelva Bush, MD [facility] Pager: [phone]

## 2018-11-10 ENCOUNTER — Telehealth: Payer: Medicare Other | Admitting: Internal Medicine

## 2018-11-10 ENCOUNTER — Other Ambulatory Visit: Payer: Self-pay

## 2018-11-11 NOTE — Progress Notes (Signed)
Virtual Visit via Telephone Note   This visit type was conducted due to national recommendations for restrictions regarding the COVID-19 Pandemic (e.g. social distancing) in an effort to limit this patient's exposure and mitigate transmission in our community.  Due to her co-morbid illnesses, this patient is at least at moderate risk for complications without adequate follow up.  This format is felt to be most appropriate for this patient at this time.  The patient did not have access to video technology/had technical difficulties with video requiring transitioning to audio format only (telephone).  All issues noted in this document were discussed and addressed.  No physical exam could be performed with this format.  Please refer to the patient's chart for her  consent to telehealth for [facility].   Date:  11/13/2018   ID:  Kelsey Mason, DOB 10-21-1940, MRN [id number]  Patient Location: Home Provider Location: Office  PCP:  Leone Haven, MD  Cardiologist:  Nelva Bush, MD  Electrophysiologist:  None   Evaluation Performed:  Follow-Up Visit  Chief Complaint: Back pain  History of Present Illness:    Kelsey Mason is a 78 y.o. female with history of nonobstructive coronary artery disease, PAD with small vessel disease involving the runoff vessels, nonischemic cardiomyopathy with normalization of LVEF, mitral regurgitation, hypertension, hyperlipidemia, type 2 diabetes mellitus,mild-moderate renal artery stenosis,and breast cancer.  I last saw her in 05/2018, at which time she reported stable dyspnea on exertion.  She noted that her prior leg pain resolved without intervention.  We did not make any medication changes or pursue further testing at that time.  Today, Kelsey Mason reports that she is doing relatively well from a heart standpoint.  Her biggest limiting factor is chronic back pain that began after chemotherapy for her breast cancer.  She denies chest  pain, shortness of breath, palpitations, lightheadedness, and edema.  She has also not had any significant leg pain since our last visit.  She is tolerating her current medications well.  She checks her blood pressure regularly at home and notes that it is usually similar to today's reading when she checks it on her upper arm but less than 140/90 when checking it on her wrist.  She believes that discomfort from the upper arm cuff is driving up her blood pressure.  She is trying to limit her sodium intake as much as possible and only eats out once a week.  The patient does not have symptoms concerning for COVID-19 infection (fever, chills, cough, or new shortness of breath).    Past Medical History:  Diagnosis Date  . Breast symptom 2014  . Cancer [facility]) 2014   Bilateral Breast, chemo Dr. Jeb Levering  . Diabetes mellitus   . GERD (gastroesophageal reflux disease)    mild  . Hypertension   . Personal history of chemotherapy   . Personal history of radiation therapy 2014   bilat lumpectomy   Past Surgical History:  Procedure Laterality Date  . ABDOMINAL AORTOGRAM W/LOWER EXTREMITY N/A 04/21/2018   Procedure: ABDOMINAL AORTOGRAM W/LOWER EXTREMITY;  Surgeon: Nelva Bush, MD;  Location: [place] [facility];  Service: Cardiovascular;  Laterality: N/A;  . ABDOMINAL HYSTERECTOMY  1976   for pelvic pain  . BREAST EXCISIONAL BIOPSY Bilateral 11/2012   bilat breast ca chemo and rad  . BREAST SURGERY Bilateral 2014   bilateral lumpectomy and SN biopsy  . CHOLECYSTECTOMY  2007  . COLONOSCOPY WITH PROPOFOL N/A 10/13/2015   Procedure: COLONOSCOPY WITH PROPOFOL;  Surgeon: Manya Silvas, MD;  Location: [facility] ENDOSCOPY;  Service: Endoscopy;  Laterality: N/A;  . LEFT HEART CATH AND CORONARY ANGIOGRAPHY N/A 04/21/2018   Procedure: LEFT HEART CATH AND CORONARY ANGIOGRAPHY;  Surgeon: Nelva Bush, MD;  Location: [place] [facility];  Service: Cardiovascular;  Laterality: N/A;  . OOPHORECTOMY Right   .  PORTACATH PLACEMENT  2014  . TUMOR REMOVAL Right 1980   right foot     Current Meds  Medication Sig  . aspirin 81 MG tablet Take 81 mg by mouth daily.  . blood glucose meter kit and supplies KIT Dispense based on patient and insurance preference. Use up to four times daily as directed. One Touch Ultra mini meter (FOR ICD-9 250.00, 250.01). Dx: E11.9  . carvedilol (COREG) 12.5 MG tablet Take 1 tablet (12.5 mg total) by mouth 2 (two) times daily.  . furosemide (LASIX) 20 MG tablet Take 1 tablet (20 mg total) by mouth daily.  [name] glimepiride (AMARYL) 2 MG tablet TAKE 1 TABLET (2 MG TOTAL) BY MOUTH 2 (TWO) TIMES DAILY.  [name] glucose blood (ONE TOUCH ULTRA TEST) test strip USE TO TEST BLOOD SUGAR UP TO 4 TIMES DAILY  . LANTUS SOLOSTAR 100 UNIT/ML Solostar Pen INJECT 20 UNITS INTO THE SKIN DAILY AT 10 PM. (Patient taking differently: Inject 44 Units into the skin at bedtime. )  . letrozole (FEMARA) 2.5 MG tablet TAKE 1 TABLET BY MOUTH EVERY DAY  . loratadine-pseudoephedrine (CLARITIN-D 24-HOUR) 10-240 MG 24 hr tablet Take 1 tablet by mouth daily.  [name] losartan (COZAAR) 50 MG tablet Take 1 tablet (50 mg total) by mouth 2 (two) times daily.  [name] NOVOFINE 32G X 6 MM MISC USE AS DIRECTED WITH LANTUS PEN  . rosuvastatin (CRESTOR) 5 MG tablet Take 1 tablet (5 mg total) by mouth daily.  . TRULICITY 1.5 GY/1.7CB SOPN Inject 1.5 mg as directed once a week.     Allergies:   Advil [ibuprofen]; Aleve [naproxen]; Fluticasone-salmeterol; Lipitor [atorvastatin]; Naproxen sodium; and Penicillins   Social History   Tobacco Use  . Smoking status: Former Smoker    Packs/day: 0.25    Years: 1.00    Pack years: 0.25    Types: Cigarettes    Last attempt to quit: 1963    Years since quitting: 57.3  . Smokeless tobacco: Never Used  Substance Use Topics  . Alcohol use: No    Alcohol/week: 0.0 standard drinks  . Drug use: No     Family Hx: The patient's family history includes Alcohol abuse in her father and  mother; Breast cancer (age of onset: 76) in her mother; Heart attack in her brother; Heart disease in her brother, father, and mother; Hypertension in her father and mother.  ROS:   Please see the history of present illness.   All other systems reviewed and are negative.   Prior CV studies:   The following studies were reviewed today:  Cath/PCI:  LHC (04/21/2018): LMCA normal.  LAD with 50% mid vessel stenosis.  LCx proper without disease.  Dominant OM3 with 50-60% proximal stenosis.  RCA with 50% ostial stenosis.  Mildly elevated LVEDP (15 to 20 mmHg).  Abdominal aortogram and runoff (04/21/2018): Aorta, inflow vessels, and outflow vessels.  There is tapering/occlusion of the distal anterior tibial and peroneal arteries on the right.  There is three-vessel runoff on the left.  CV Surgery:  None  EP Procedures and Devices:  None  Non-Invasive Evaluation(s):  TTE (03/14/18): Mildly dilated LV with LVEF 40-45%.  Global hypokinesis with grade 2 diastolic dysfunction. Moderate MR and mild LA enlargement. Normal RV size and function.  ABIs (02/22/2018): Noncompressible runoff vessels bilaterally with triphasic waveforms. TBI's are normal bilaterally.  Renal artery Doppler (04/20/17): Mild to moderate (less than 60%) stenosis of the right renal artery. No significant left renal artery disease.  TTE (01/14/17): Normal LV size with LVEF 50-55%. Anteroseptal hypokinesis is noted, as well as grade 1 diastolic dysfunction. Mild to moderate MR. Normal RV size and function. Mild pulmonary hypertension.  Pharmacologic myocardial perfusion stress test (06/16/16): Low risk study with moderate in size, moderate in severity, fixed defect involving the septum most likely related to LBBB. No evidence of ischemia. LVEF 50% by automated calculation and likely higher based on visual estimation.  ABIs (06/10/16): ABIs: Right not obtainable, left 1.4. TBIs right 1.1, left not obtainable. Bilateral great  toe PPG's are normal. Bilateral common femoral, popliteal, peroneal, anterior tibial, and posterior tibial artery waveforms are brisk and triphasic.  [facility]): Mildly dilated left ventricle with mild to moderate LV dysfunction (EF 35-45%). Grade 1 diastolic dysfunction. Moderate mitral regurgitation. Mild left atrial enlargement. Normal RV function.  TTE (01/01/14, [facility]): Moderate to severe LV dysfunction (EF 30%) with mild LVH and mild left ventricular dilation. Mitral annular calcification with moderate MR and moderate TR noted. Normal right ventricular contraction.  MUGA (12/25/12): Normal contraction and wall motion. EF 63%.  Labs/Other Tests and Data Reviewed:    EKG:  No ECG reviewed.  Recent Labs: 05/16/2018: TSH 5.02 09/18/2018: ALT 16; BUN 14; Creatinine, Ser 1.30; Hemoglobin 13.7; Platelets 229; Potassium 4.4; Sodium 133   Recent Lipid Panel Lab Results  Component Value Date/Time   CHOL 134 11/25/2017 08:42 AM   TRIG 96 11/25/2017 08:42 AM   HDL 45 11/25/2017 08:42 AM   CHOLHDL 3.0 11/25/2017 08:42 AM   LDLCALC 70 11/25/2017 08:42 AM   LDLDIRECT 178.7 10/02/2012 09:27 AM    Wt Readings from Last 3 Encounters:  11/13/18 166 lb (75.3 kg)  09/18/18 167 lb 6.4 oz (75.9 kg)  09/17/18 170 lb 9.6 oz (77.4 kg)     Objective:    Vital Signs:  BP (!) 153/79 (BP Location: Left Arm, Patient Position: Sitting, Cuff Size: Normal)   Pulse 80   Ht 5' 2.5" (1.588 m)   Wt 166 lb (75.3 kg)   BMI 29.88 kg/m    VITAL SIGNS:  reviewed  ASSESSMENT & PLAN:    Nonobstructive coronary artery disease: No symptoms to suggest worsening coronary insufficiency.  We will continue current medications for secondary prevention.  Chronic HFpEF: Echo last year notable for LVEF of 40 to 45%.  Subsequent cath showed nonobstructive CAD.  Kelsey Mason has stable NYHA class II heart failure symptoms without significant edema.  We will continue her current medications.   Hypertension: Blood pressure suboptimally controlled, though Kelsey Mason believes that discomfort from the sphygmomanometer is contributing to this.  We discussed escalation of her antihypertensive therapy, though she wishes to defer this for now.  In the future, we would need to consider increasing carvedilol or adding another agent such as amlodipine.  For now, we will continue carvedilol 12.5 mg twice daily, furosemide 20 mg daily, and losartan 50 mg twice daily.  Hyperlipidemia: Kelsey Mason is tolerating rosuvastatin 5 mg daily well.  Further escalation has been limited in the past by myalgias by myalgias.  Last LDL a year ago was reasonable at 26.  No medication changes planned today.  COVID-19 Education: The signs and symptoms of COVID-19 were discussed with the patient and how to seek care for testing (follow up with PCP or arrange E-visit).  The importance of social distancing was discussed today.  Time:   Today, I have spent 13 minutes with the patient with telehealth technology discussing the above problems.     Medication Adjustments/Labs and Tests Ordered: Current medicines are reviewed at length with the patient today.  Concerns regarding medicines are outlined above.   Tests Ordered: None.  Medication Changes: None.  Disposition:  Follow up in 3 month(s)  Signed, Nelva Bush, MD  11/13/2018 10:07 AM    [facility]

## 2018-11-13 ENCOUNTER — Other Ambulatory Visit: Payer: Self-pay

## 2018-11-13 ENCOUNTER — Telehealth (INDEPENDENT_AMBULATORY_CARE_PROVIDER_SITE_OTHER): Payer: Medicare Other | Admitting: Internal Medicine

## 2018-11-13 ENCOUNTER — Encounter: Payer: Self-pay | Admitting: Internal Medicine

## 2018-11-13 VITALS — BP 153/79 | HR 80 | Ht 62.5 in | Wt 166.0 lb

## 2018-11-13 DIAGNOSIS — I10 Essential (primary) hypertension: Secondary | ICD-10-CM

## 2018-11-13 DIAGNOSIS — I2510 Atherosclerotic heart disease of native coronary artery without angina pectoris: Secondary | ICD-10-CM | POA: Diagnosis not present

## 2018-11-13 DIAGNOSIS — E785 Hyperlipidemia, unspecified: Secondary | ICD-10-CM | POA: Diagnosis not present

## 2018-11-13 DIAGNOSIS — I509 Heart failure, unspecified: Secondary | ICD-10-CM | POA: Insufficient documentation

## 2018-11-13 DIAGNOSIS — I5032 Chronic diastolic (congestive) heart failure: Secondary | ICD-10-CM | POA: Insufficient documentation

## 2018-11-13 MED ORDER — NITROGLYCERIN 0.4 MG SL SUBL: 0.4000 mg | SUBLINGUAL_TABLET | SUBLINGUAL | 3 refills | Status: DC | PRN

## 2018-11-13 NOTE — Patient Instructions (Signed)
Any Other Special Instructions Will Be Listed Below (If Applicable).  Please monitor your blood pressure and let us know if it is consistently greater than 150/90.    Medication Instructions:  Your physician has recommended you make the following change in your medication:  1- Nitroglycerin 0.4 mg tablets - Dissolve one tablet under the tongue every 5 minutes AS NEEDED for chest pain. No more than 3 doses. If chest pain does not resolve, then call 911 or go to the Emergency room.    If you need a refill on your cardiac medications before your next appointment, please call your pharmacy.   Lab work: none If you have labs (blood work) drawn today and your tests are completely normal, you will receive your results only by: [name] MyChart Message (if you have MyChart) OR . A paper copy in the mail If you have any lab test that is abnormal or we need to change your treatment, we will call you to review the results.  Testing/Procedures: none  Follow-Up: At [facility], you and your health needs are our priority.  As part of our continuing mission to provide you with exceptional heart care, we have created designated Provider Care Teams.  These Care Teams include your primary Cardiologist (physician) and Advanced Practice Providers (APPs -  Physician Assistants and Nurse Practitioners) who all work together to provide you with the care you need, when you need it. You will need a follow up appointment in 3 months.  Please call our office 2 months in advance to schedule this appointment.  You may see Nelva Bush, MD or one of the following Advanced Practice Providers on your designated Care Team:   Murray Hodgkins, NP Christell Faith, PA-C . Marrianne Mood, PA-C

## 2018-11-16 ENCOUNTER — Ambulatory Visit: Payer: Medicare Other | Admitting: Internal Medicine

## 2018-11-20 ENCOUNTER — Other Ambulatory Visit: Payer: Self-pay

## 2018-11-20 ENCOUNTER — Other Ambulatory Visit (INDEPENDENT_AMBULATORY_CARE_PROVIDER_SITE_OTHER): Payer: Medicare Other

## 2018-11-20 DIAGNOSIS — E559 Vitamin D deficiency, unspecified: Secondary | ICD-10-CM

## 2018-11-20 LAB — VITAMIN D 25 HYDROXY (VIT D DEFICIENCY, FRACTURES): VITD: 35.78 ng/mL (ref 30.00–100.00)

## 2018-11-21 ENCOUNTER — Other Ambulatory Visit: Payer: Self-pay | Admitting: Internal Medicine

## 2018-11-21 NOTE — Telephone Encounter (Signed)
Please review for refill.  

## 2018-11-21 NOTE — Telephone Encounter (Signed)
The patient had an e-visit with Dr. Saunders Revel on 11/13/18.  It was noted that she was taking: Losartan 100 mg- 1/2 tablet (50 mg) BID.   No med changes made to losartan at her last visit with Dr. Saunders Revel.  Do you mind calling her to see if she would like [place] to change the losartan RX to a 50 mg tablet BID (so she wouldn't have to cut the 100 mg in 1/2)?  If not, ok to refill as the current RX request states.   Thanks!

## 2018-12-10 ENCOUNTER — Other Ambulatory Visit: Payer: Self-pay | Admitting: Internal Medicine

## 2018-12-12 ENCOUNTER — Other Ambulatory Visit: Payer: Self-pay

## 2018-12-12 MED ORDER — CARVEDILOL 12.5 MG PO TABS: 12.5000 mg | ORAL_TABLET | Freq: Two times a day (BID) | ORAL | 3 refills | Status: DC

## 2018-12-16 ENCOUNTER — Other Ambulatory Visit: Payer: Self-pay | Admitting: Internal Medicine

## 2018-12-25 ENCOUNTER — Telehealth: Payer: Self-pay

## 2018-12-25 MED ORDER — LOSARTAN POTASSIUM 100 MG PO TABS: 50.0000 mg | ORAL_TABLET | Freq: Two times a day (BID) | ORAL | 1 refills | Status: DC

## 2018-12-25 NOTE — Telephone Encounter (Signed)
Requested Prescriptions   Signed Prescriptions Disp Refills  . losartan (COZAAR) 100 MG tablet 30 tablet 1    Sig: Take 0.5 tablets (50 mg total) by mouth 2 (two) times daily.    Authorizing Provider: END, CHRISTOPHER    Ordering User: Raelene Bott, Greenly Rarick L

## 2018-12-27 ENCOUNTER — Other Ambulatory Visit: Payer: Self-pay | Admitting: Family Medicine

## 2019-01-18 ENCOUNTER — Other Ambulatory Visit: Payer: Self-pay | Admitting: Internal Medicine

## 2019-01-18 NOTE — Telephone Encounter (Signed)
Pt is taking Losartan 50 mg tablet bid. Please advise correct dose/instructions and if ok to refill medication.

## 2019-01-21 ENCOUNTER — Other Ambulatory Visit: Payer: Self-pay | Admitting: Family Medicine

## 2019-01-24 ENCOUNTER — Other Ambulatory Visit: Payer: Self-pay | Admitting: Family Medicine

## 2019-01-24 ENCOUNTER — Other Ambulatory Visit: Payer: Self-pay | Admitting: Hematology and Oncology

## 2019-01-24 DIAGNOSIS — Z1231 Encounter for screening mammogram for malignant neoplasm of breast: Secondary | ICD-10-CM

## 2019-01-24 DIAGNOSIS — C50411 Malignant neoplasm of upper-outer quadrant of right female breast: Secondary | ICD-10-CM

## 2019-02-19 LAB — CBC AND DIFFERENTIAL
HCT: 37 (ref 36–46)
Hemoglobin: 12.1 (ref 12.0–16.0)
Platelets: 217 (ref 150–399)
WBC: 10.3

## 2019-02-19 LAB — BASIC METABOLIC PANEL
BUN: 19 (ref 4–21)
Creatinine: 1.3 — AB (ref <=1.1)
Glucose: 91
Potassium: 5 (ref 3.4–5.3)
Sodium: 137 (ref 137–147)

## 2019-03-02 ENCOUNTER — Other Ambulatory Visit: Payer: Self-pay

## 2019-03-02 ENCOUNTER — Ambulatory Visit (INDEPENDENT_AMBULATORY_CARE_PROVIDER_SITE_OTHER): Payer: Medicare Other | Admitting: Family

## 2019-03-02 ENCOUNTER — Other Ambulatory Visit: Payer: Self-pay | Admitting: *Deleted

## 2019-03-02 ENCOUNTER — Encounter: Payer: Self-pay | Admitting: Family

## 2019-03-02 VITALS — Ht 62.52 in | Wt 166.0 lb

## 2019-03-02 DIAGNOSIS — R6889 Other general symptoms and signs: Secondary | ICD-10-CM

## 2019-03-02 DIAGNOSIS — R509 Fever, unspecified: Secondary | ICD-10-CM

## 2019-03-02 DIAGNOSIS — Z20822 Contact with and (suspected) exposure to covid-19: Secondary | ICD-10-CM

## 2019-03-02 MED ORDER — LOSARTAN POTASSIUM 100 MG PO TABS: 50.0000 mg | ORAL_TABLET | Freq: Two times a day (BID) | ORAL | 1 refills | Status: DC

## 2019-03-02 NOTE — Assessment & Plan Note (Addendum)
Waxing and waning. Tmax 101.3. No acute respiratory distress and patient declines that she needs further medical attention at the ER. She feels safe at home. Stressed the importance of fluids and close monitoring of blood sugar especially in setting of illness and change in appetite. Discussed conservative management. Pending covid test. She verbalized understanding of all and will let us know how she is doing.

## 2019-03-02 NOTE — Telephone Encounter (Signed)
Please advise if ok to refill Losartan 100 mg 1/2 tablet bid. Losartan 50 mg tablet not available at this time.

## 2019-03-02 NOTE — Patient Instructions (Addendum)
Suspect viral infection.   Hold lantus if you are not eating or not eating normally.   Drink plenty of  fluids  Monitor blood sugar 2-3 days per day; when you sick blood sugars can go high .. or LOW. We need to be very careful here.   COVID test ordered; please go anytime today between 8am and 3:30. You have to be in line by 3:30 to be tested as site closes at 4pm. They are open Monday through Friday.   Please go to  [facility] will see a tent; please let me know if you have any questions.   Remember:  Please stay home and quarantine.  You may not return to work until at least 7 days since symptom onset and AT LEAST 3 days of NO symptoms/complete resolution prior to returning to work.  Please  Seek medical attention if symptoms worsen.    Stay safe.     Person Under Monitoring Name: Kelsey Mason  Location:  [place] [address]   Infection Prevention Recommendations for Individuals Confirmed to have, or Being Evaluated for, 2019 Novel Coronavirus (COVID-19) Infection Who Receive Care at Home  Individuals who are confirmed to have, or are being evaluated for, COVID-19 should follow the prevention steps below until a healthcare provider or local or state health department says they can return to normal activities.  Stay home except to get medical care You should restrict activities outside your home, except for getting medical care. Do not go to work, school, or public areas, and do not use public transportation or taxis.  Call ahead before visiting your doctor Before your medical appointment, call the healthcare provider and tell them that you have, or are being evaluated for, COVID-19 infection. This will help the healthcare provider's office take steps to keep other people from getting infected. Ask your healthcare provider to call the local or state health  department.  Monitor your symptoms Seek prompt medical attention if your illness is worsening (e.g., difficulty breathing). Before going to your medical appointment, call the healthcare provider and tell them that you have, or are being evaluated for, COVID-19 infection. Ask your healthcare provider to call the local or state health department.  Wear a facemask You should wear a facemask that covers your nose and mouth when you are in the same room with other people and when you visit a healthcare provider. People who live with or visit you should also wear a facemask while they are in the same room with you.  Separate yourself from other people in your home As much as possible, you should stay in a different room from other people in your home. Also, you should use a separate bathroom, if available.  Avoid sharing household items You should not share dishes, drinking glasses, cups, eating utensils, towels, bedding, or other items with other people in your home. After using these items, you should wash them thoroughly with soap and water.  Cover your coughs and sneezes Cover your mouth and nose with a tissue when you cough or sneeze, or you can cough or sneeze into your sleeve. Throw used tissues in a lined trash can, and immediately wash your hands with soap and water for at least 20 seconds or use an alcohol-based hand rub.  Wash your [facility] your hands often and thoroughly with soap and water for at least 20 seconds.  You can use an alcohol-based hand sanitizer if soap and water are not available and if your hands are not visibly dirty. Avoid touching your eyes, nose, and mouth with unwashed hands.   Prevention Steps for Caregivers and Household Members of Individuals Confirmed to have, or Being Evaluated for, COVID-19 Infection Being Cared for in the Home  If you live with, or provide care at home for, a person confirmed to have, or being evaluated for, COVID-19  infection please follow these guidelines to prevent infection:  Follow healthcare provider's instructions Make sure that you understand and can help the patient follow any healthcare provider instructions for all care.  Provide for the patient's basic needs You should help the patient with basic needs in the home and provide support for getting groceries, prescriptions, and other personal needs.  Monitor the patient's symptoms If they are getting sicker, call his or her medical provider and tell them that the patient has, or is being evaluated for, COVID-19 infection. This will help the healthcare provider's office take steps to keep other people from getting infected. Ask the healthcare provider to call the local or state health department.  Limit the number of people who have contact with the patient  If possible, have only one caregiver for the patient.  Other household members should stay in another home or place of residence. If this is not possible, they should stay  in another room, or be separated from the patient as much as possible. Use a separate bathroom, if available.  Restrict visitors who do not have an essential need to be in the home.  Keep older adults, very young children, and other sick people away from the patient Keep older adults, very young children, and those who have compromised immune systems or chronic health conditions away from the patient. This includes people with chronic heart, lung, or kidney conditions, diabetes, and cancer.  Ensure good ventilation Make sure that shared spaces in the home have good air flow, such as from an air conditioner or an opened window, weather permitting.  Wash your hands often  Wash your hands often and thoroughly with soap and water for at least 20 seconds. You can use an alcohol based hand sanitizer if soap and water are not available and if your hands are not visibly dirty.  Avoid touching your eyes, nose, and mouth  with unwashed hands.  Use disposable paper towels to dry your hands. If not available, use dedicated cloth towels and replace them when they become wet.  Wear a facemask and gloves  Wear a disposable facemask at all times in the room and gloves when you touch or have contact with the patient's blood, body fluids, and/or secretions or excretions, such as sweat, saliva, sputum, nasal mucus, vomit, urine, or feces.  Ensure the mask fits over your nose and mouth tightly, and do not touch it during use.  Throw out disposable facemasks and gloves after using them. Do not reuse.  Wash your hands immediately after removing your facemask and gloves.  If your personal clothing becomes contaminated, carefully remove clothing and launder. Wash your hands after handling contaminated clothing.  Place all used disposable facemasks, gloves, and other waste in a lined container before disposing them with other household waste.  Remove gloves and wash your hands immediately after handling these items.  Do not share dishes, glasses, or other household items with the patient  Avoid sharing household items. You should not share dishes, drinking glasses, cups, eating utensils,  towels, bedding, or other items with a patient who is confirmed to have, or being evaluated for, COVID-19 infection.  After the person uses these items, you should wash them thoroughly with soap and water.  Wash laundry thoroughly  Immediately remove and wash clothes or bedding that have blood, body fluids, and/or secretions or excretions, such as sweat, saliva, sputum, nasal mucus, vomit, urine, or feces, on them.  Wear gloves when handling laundry from the patient.  Read and follow directions on labels of laundry or clothing items and detergent. In general, wash and dry with the warmest temperatures recommended on the label.  Clean all areas the individual has used often  Clean all touchable surfaces, such as counters, tabletops,  doorknobs, bathroom fixtures, toilets, phones, keyboards, tablets, and bedside tables, every day. Also, clean any surfaces that may have blood, body fluids, and/or secretions or excretions on them.  Wear gloves when cleaning surfaces the patient has come in contact with.  Use a diluted bleach solution (e.g., dilute bleach with 1 part bleach and 10 parts water) or a household disinfectant with a label that says EPA-registered for coronaviruses. To make a bleach solution at home, add 1 tablespoon of bleach to 1 quart (4 cups) of water. For a larger supply, add  cup of bleach to 1 gallon (16 cups) of water.  Read labels of cleaning products and follow recommendations provided on product labels. Labels contain instructions for safe and effective use of the cleaning product including precautions you should take when applying the product, such as wearing gloves or eye protection and making sure you have good ventilation during use of the product.  Remove gloves and wash hands immediately after cleaning.  Monitor yourself for signs and symptoms of illness Caregivers and household members are considered close contacts, should monitor their health, and will be asked to limit movement outside of the home to the extent possible. Follow the monitoring steps for close contacts listed on the symptom monitoring form.   ? If you have additional questions, contact your local health department or call the epidemiologist on call at [phone] (available 24/7). ? This guidance is subject to change. For the most up-to-date guidance from [facility], please refer to their website: [employer]

## 2019-03-02 NOTE — Telephone Encounter (Signed)
Ok to refill 

## 2019-03-02 NOTE — Progress Notes (Signed)
TELEPHONE VISIT   This visit type was conducted due to national recommendations for restrictions regarding the COVID-19 pandemic (e.g. social distancing).  This format is felt to be most appropriate for this patient at this time.  All issues noted in this document were discussed and addressed.  No physical exam was performed (except for noted visual exam findings with Video Visits). Virtual Visit via Video Note  I connected with@  on 03/02/19 at  1:15 PM EDT by a TELEPHONE enabled telemedicine application and verified that I am speaking with the correct person using two identifiers.  Location patient: home Location provider:work  Persons participating in the virtual visit: patient, provider  I discussed the limitations of evaluation and management by telemedicine and the availability of in person appointments. The patient expressed understanding and agreed to proceed.   HPI:  CC: flu like symptoms x 5 days, unchanged.  Endorses chills, 'little bit of cough', loss of appetite  No sob, CP, nasal congestion, wheezing, dysuria, abdominal pain.   101.27F yesterday.  This morning temperature 98.37F and then went up to 101.3 later this morning.   127/67 today.  Taking tylenol.  Drinking water, orange juice. Had an apple today.   Hasnt checked blood sugar today. Has held insulin since appetite has been decreased. Urine is light color.    No known sick contacts. Lives alone. Wearing mask and gloves when out.   ROS: See pertinent positives and negatives per HPI.  Past Medical History:  Diagnosis Date  . Breast symptom 2014  . Cancer [facility]) 2014   Bilateral Breast, chemo Dr. Jeb Levering  . Diabetes mellitus   . GERD (gastroesophageal reflux disease)    mild  . Hypertension   . Personal history of chemotherapy   . Personal history of radiation therapy 2014   bilat lumpectomy    Past Surgical History:  Procedure Laterality Date  . ABDOMINAL AORTOGRAM W/LOWER EXTREMITY N/A 04/21/2018   Procedure: ABDOMINAL AORTOGRAM W/LOWER EXTREMITY;  Surgeon: Nelva Bush, MD;  Location: [facility];  Service: Cardiovascular;  Laterality: N/A;  . ABDOMINAL HYSTERECTOMY  1976   for pelvic pain  . BREAST EXCISIONAL BIOPSY Bilateral 11/2012   bilat breast ca chemo and rad  . BREAST SURGERY Bilateral 2014   bilateral lumpectomy and SN biopsy  . CHOLECYSTECTOMY  2007  . COLONOSCOPY WITH PROPOFOL N/A 10/13/2015   Procedure: COLONOSCOPY WITH PROPOFOL;  Surgeon: Manya Silvas, MD;  Location: [facility] ENDOSCOPY;  Service: Endoscopy;  Laterality: N/A;  . LEFT HEART CATH AND CORONARY ANGIOGRAPHY N/A 04/21/2018   Procedure: LEFT HEART CATH AND CORONARY ANGIOGRAPHY;  Surgeon: Nelva Bush, MD;  Location: [facility];  Service: Cardiovascular;  Laterality: N/A;  . OOPHORECTOMY Right   . PORTACATH PLACEMENT  2014  . TUMOR REMOVAL Right 1980   right foot    Family History  Problem Relation Age of Onset  . Alcohol abuse Mother   . Heart disease Mother   . Hypertension Mother   . Breast cancer Mother 53  . Alcohol abuse Father   . Heart disease Father   . Hypertension Father   . Heart disease Brother   . Heart attack Brother     SOCIAL HX: former smoker   Current Outpatient Medications:  .  aspirin 81 MG tablet, Take 81 mg by mouth daily., Disp: , Rfl:  .  blood glucose meter kit and supplies KIT, Dispense based on patient and insurance preference. Use up to four times daily as directed.  One Touch Ultra mini meter (FOR ICD-9 250.00, 250.01). Dx: E11.9, Disp: 1 each, Rfl: 0 .  carvedilol (COREG) 12.5 MG tablet, Take 1 tablet (12.5 mg total) by mouth 2 (two) times daily., Disp: 180 tablet, Rfl: 3 .  furosemide (LASIX) 20 MG tablet, , Disp: , Rfl:  .  glimepiride (AMARYL) 2 MG tablet, TAKE 1 TABLET (2 MG TOTAL) BY MOUTH 2 (TWO) TIMES DAILY., Disp: 180 tablet, Rfl: 1 .  glucose blood (ONE TOUCH ULTRA TEST) test strip, USE TO TEST BLOOD SUGAR UP TO 4 TIMES DAILY, Disp: 100  each, Rfl: 6 .  LANTUS SOLOSTAR 100 UNIT/ML Solostar Pen, INJECT 20 UNITS INTO THE SKIN DAILY AT 10 PM. (Patient taking differently: Inject 44 Units into the skin at bedtime. ), Disp: 18 pen, Rfl: 0 .  letrozole (FEMARA) 2.5 MG tablet, TAKE 1 TABLET BY MOUTH EVERY DAY, Disp: 90 tablet, Rfl: 0 .  loratadine-pseudoephedrine (CLARITIN-D 24-HOUR) 10-240 MG 24 hr tablet, Take 1 tablet by mouth daily., Disp: , Rfl:  .  losartan (COZAAR) 100 MG tablet, Take 0.5 tablets (50 mg total) by mouth 2 (two) times daily. Take 1 tablet (50 mg) by mouth twice daily, Disp: 90 tablet, Rfl: 1 .  nitroGLYCERIN (NITROSTAT) 0.4 MG SL tablet, Place 1 tablet (0.4 mg total) under the tongue every 5 (five) minutes as needed for chest pain. Maximum of 3 doses., Disp: 25 tablet, Rfl: 3 .  NOVOFINE 32G X 6 MM MISC, USE AS DIRECTED WITH LANTUS PEN, Disp: 100 each, Rfl: 2 .  rosuvastatin (CRESTOR) 5 MG tablet, TAKE 1 TABLET BY MOUTH EVERY DAY, Disp: 90 tablet, Rfl: 0 .  TRULICITY 1.5 WT/8.8EK SOPN, Inject 1.5 mg as directed once a week., Disp: , Rfl: 11 .  furosemide (LASIX) 20 MG tablet, Take 1 tablet (20 mg total) by mouth daily., Disp: 90 tablet, Rfl: 3  ASSESSMENT AND PLAN:  Discussed the following assessment and plan:  Problem List Items Addressed This Visit      Other   Fever    Waxing and waning. Tmax 101.3. No acute respiratory distress and patient declines that she needs further medical attention at the ER. She feels safe at home. Stressed the importance of fluids and close monitoring of blood sugar especially in setting of illness and change in appetite. Discussed conservative management. Pending covid test. She verbalized understanding of all and will let us know how she is doing.       Other Visit Diagnoses    Flu-like symptoms    -  Primary        I discussed the assessment and treatment plan with the patient. The patient was provided an opportunity to ask questions and all were answered. The patient  agreed with the plan and demonstrated an understanding of the instructions.   The patient was advised to call back or seek an in-person evaluation if the symptoms worsen or if the condition fails to improve as anticipated.   Mable Paris, FNP   I spent 20 min non face to face w/ pt.

## 2019-03-04 LAB — NOVEL CORONAVIRUS, NAA: SARS-CoV-2, NAA: NOT DETECTED

## 2019-03-05 ENCOUNTER — Ambulatory Visit (INDEPENDENT_AMBULATORY_CARE_PROVIDER_SITE_OTHER): Payer: Medicare Other | Admitting: Family

## 2019-03-05 ENCOUNTER — Encounter: Payer: Self-pay | Admitting: Family

## 2019-03-05 ENCOUNTER — Other Ambulatory Visit: Payer: Self-pay

## 2019-03-05 ENCOUNTER — Other Ambulatory Visit: Payer: Self-pay | Admitting: Internal Medicine

## 2019-03-05 DIAGNOSIS — R509 Fever, unspecified: Secondary | ICD-10-CM | POA: Diagnosis not present

## 2019-03-05 MED ORDER — DOXYCYCLINE HYCLATE 100 MG PO TABS: 100.0000 mg | ORAL_TABLET | Freq: Two times a day (BID) | ORAL | 0 refills | Status: DC

## 2019-03-05 NOTE — Assessment & Plan Note (Addendum)
Largely unchanged. No acute respiratory distress. Concerned for secondary bacterial infection. Will start doxycycline . Pending CXR. IN setting of poor appetite and after consult with Catie, pharmacy, I have advised to decrease lantus.  She will start lantus 26units ( this is down 20% from 32units). Advised to hold glimiride for now since she not eating regular meals . Advised to continue to check blood sugar twice as day as she is doing.Close follow up in 4 days.

## 2019-03-05 NOTE — Progress Notes (Signed)
Verbal consent for services obtained from patient prior to services given to TELEPHONE visit:   Location of call:  provider at work patient at home  Names of all persons present for services: Mable Paris, NP Chief complaint:   CC: fever, chills, cough x 10 days, unchanged.  Endorses PND. Feels safe to be home. Lives alone. Son comes over to check on her daily.  Fever is 100 and then will go down to 33F.   NO facial pain, swelling, sob, cp, wheezing.  Taking tylenol. Drinking OJ, water, coke. Urine is light.   Blood sugar- Following endocrine. Taking 32 units  lantus.  FBG today 200. Normally around 80.  On amaryl and trulicity. Held lantus for past 3 days.  No vision changes, urinary frequency. Had a frosty today. Plans to eat chicken noodle soup tonight.   COVID negative.   History, background, results pertinent:  Seen 03/02/19 Former smoker  A/P/next steps:  Problem List Items Addressed This Visit      Other   Fever - Primary    Largely unchanged. No acute respiratory distress. Concerned for secondary bacterial infection. Will start doxycycline . Pending CXR. IN setting of poor appetite and after consult with Catie, pharmacy, I have advised to decrease lantus.  She will start lantus 26units ( this is down 20% from 32units). Advised to hold glimiride for now since she not eating regular meals . Advised to continue to check blood sugar twice as day as she is doing.Close follow up in 4 days.       Relevant Medications   doxycycline (VIBRA-TABS) 100 MG tablet   Other Relevant Orders   DG Chest 2 View      I spent 20 min  discussing plan of care over the phone.

## 2019-03-05 NOTE — Patient Instructions (Addendum)
Start doxycycline   Ensure to take probiotics while on antibiotics and also for 2 weeks after completion. It is important to re-colonize the gut with good bacteria and also to prevent any diarrheal infections associated with antibiotic use.    Chest xray at [facility] today  Close follow up 03/09/19 at 9:30. Call us sooner if any concerns at all.   Stay safe!   Cough, Adult Coughing is a reflex that clears your throat and your airways (respiratory system). Coughing helps to heal and protect your lungs. It is normal to cough occasionally, but a cough that happens with other symptoms or lasts a long time may be a sign of a condition that needs treatment. An acute cough may only last 2-3 weeks, while a chronic cough may last 8 or more weeks. Coughing is commonly caused by:  Infection of the respiratory systemby viruses or bacteria.  Breathing in substances that irritate your lungs.  Allergies.  Asthma.  Mucus that runs down the back of your throat (postnasal drip).  Smoking.  Acid backing up from the stomach into the esophagus (gastroesophageal reflux).  Certain medicines.  Chronic lung problems.  Other medical conditions such as heart failure or a blood clot in the lung (pulmonary embolism). Follow these instructions at home: Medicines  Take over-the-counter and prescription medicines only as told by your health care provider.  Talk with your health care provider before you take a cough suppressant medicine. Lifestyle   Avoid cigarette smoke. Do not use any products that contain nicotine or tobacco, such as cigarettes, e-cigarettes, and chewing tobacco. If you need help quitting, ask your health care provider.  Drink enough fluid to keep your urine pale yellow.  Avoid caffeine.  Do not drink alcohol if your health care provider tells you not to drink. General instructions   Pay close attention to changes in your cough. Tell your health care provider about them.  Always  cover your mouth when you cough.  Avoid things that make you cough, such as perfume, candles, cleaning products, or campfire or tobacco smoke.  If the air is dry, use a cool mist vaporizer or humidifier in your bedroom or your home to help loosen secretions.  If your cough is worse at night, try to sleep in a semi-upright position.  Rest as needed.  Keep all follow-up visits as told by your health care provider. This is important. Contact a health care provider if you:  Have new symptoms.  Cough up pus.  Have a cough that does not get better after 2-3 weeks or gets worse.  Cannot control your cough with cough suppressant medicines and you are losing sleep.  Have pain that gets worse or pain that is not helped with medicine.  Have a fever.  Have unexplained weight loss.  Have night sweats. Get help right away if:  You cough up blood.  You have difficulty breathing.  Your heartbeat is very fast. These symptoms may represent a serious problem that is an emergency. Do not wait to see if the symptoms will go away. Get medical help right away. Call your local emergency services (911 in the [place] Do not drive yourself to the hospital. Summary  Coughing is a reflex that clears your throat and your airways. It is normal to cough occasionally, but a cough that happens with other symptoms or lasts a long time may be a sign of a condition that needs treatment.  Take over-the-counter and prescription medicines only as told by your  health care provider.  Always cover your mouth when you cough.  Contact a health care provider if you have new symptoms or a cough that does not get better after 2-3 weeks or gets worse. This information is not intended to replace advice given to you by your health care provider. Make sure you discuss any questions you have with your health care provider. Document Released: 12/18/2010 Document Revised: 07/10/2018 Document Reviewed: 07/10/2018 Elsevier  Patient Education  [place].

## 2019-03-07 ENCOUNTER — Telehealth: Payer: Self-pay | Admitting: *Deleted

## 2019-03-07 NOTE — Telephone Encounter (Signed)
Copied from [place] [id number]. Topic: General - Other >> Mar 07, 2019  8:15 AM Leward Quan A wrote: Reason for CRM: Patient called to inquire of her appointment on Friday 03/09/2019 she stated that she is supposed to be coming in for a chest X-ray per Joycelyn Schmid but she was informed that it states telephone visit. Patient is asking for a call back with clarification of this visit please. Ph# [phone]

## 2019-03-07 NOTE — Telephone Encounter (Signed)
I spoke with patient to clarify. I stated that her chest xray should be done over at the hospital & I recommended that she go ahead ASAP to have this done. Her visit would be Friday & would be a telephone call with Joycelyn Schmid at 9:30. Patient stated that she was going to have her son drive her to the hospital to have xray done.

## 2019-03-08 ENCOUNTER — Ambulatory Visit
Admission: RE | Admit: 2019-03-08 | Discharge: 2019-03-08 | Disposition: A | Discharge status: Home / Self Care | Payer: Medicare Other | Attending: Family | Admitting: Family

## 2019-03-08 ENCOUNTER — Other Ambulatory Visit: Payer: Self-pay

## 2019-03-08 ENCOUNTER — Ambulatory Visit
Admission: RE | Admit: 2019-03-08 | Discharge: 2019-03-08 | Disposition: A | Discharge status: Home / Self Care | Payer: Medicare Other | Source: Ambulatory Visit | Attending: Family | Admitting: Family

## 2019-03-08 DIAGNOSIS — R509 Fever, unspecified: Secondary | ICD-10-CM | POA: Insufficient documentation

## 2019-03-09 ENCOUNTER — Ambulatory Visit (INDEPENDENT_AMBULATORY_CARE_PROVIDER_SITE_OTHER): Payer: Medicare Other | Admitting: Family

## 2019-03-09 DIAGNOSIS — J189 Pneumonia, unspecified organism: Secondary | ICD-10-CM | POA: Insufficient documentation

## 2019-03-09 DIAGNOSIS — J181 Lobar pneumonia, unspecified organism: Secondary | ICD-10-CM

## 2019-03-09 NOTE — Patient Instructions (Signed)
Plenty of water  Monitor blood sugar and appetite. Please ensure appetite continues to increase to your baseline. You may resume typical diabetic drug regimen as discussed. Please monitor for any low sugars.   We are calling you to schedule repeat chest xray in 3-4 weeks to ensure your pneumonia has resolved. Please let me know if this is not scheduled.     Let us know how you are doing

## 2019-03-09 NOTE — Assessment & Plan Note (Addendum)
Feeling  Better. Afebrile. Will return for repeat CXR 3-4 weeks. Advised probiotic. She will resume regular diabetic medication regimen and monitor closely for low blood sugars or decrease of appetite. She will let us know how she is doing

## 2019-03-09 NOTE — Progress Notes (Signed)
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 Coughing a little more. Cough is dry.  No nasal congestion, ear pain, wheezing, sob, cp.   Continues to drink water. Appetite is improving.   CXR shows suspected right PNA  DM- FBG today 174 ( day prior 200). Taking 36 units lantus , which is regular dose for her. Has been holding glimepiride. Checks blood sugar in the morning and in the evening before eats. No hypoglycemic episodes.   History, background, results pertinent:  Former smoker  A/P/next steps: Problem List Items Addressed This Visit      Respiratory   Pneumonia    Feeling  Better. Afebrile. Will return for repeat CXR 3-4 weeks. Advised probiotic. She will resume regular diabetic medication regimen and monitor closely for low blood sugars or decrease of appetite. She will let us know how she is doing           I spent 15 min  discussing plan of care over the phone.

## 2019-03-09 NOTE — Addendum Note (Signed)
Addended by: Burnard Hawthorne on: 03/09/2019 06:38 AM   Modules accepted: Orders

## 2019-03-13 ENCOUNTER — Other Ambulatory Visit: Payer: Self-pay

## 2019-03-13 MED ORDER — FUROSEMIDE 20 MG PO TABS: 20.0000 mg | ORAL_TABLET | Freq: Every day | ORAL | 3 refills | Status: DC

## 2019-03-20 ENCOUNTER — Other Ambulatory Visit: Payer: Self-pay

## 2019-03-20 DIAGNOSIS — Z20822 Contact with and (suspected) exposure to covid-19: Secondary | ICD-10-CM

## 2019-03-22 ENCOUNTER — Other Ambulatory Visit: Payer: Self-pay

## 2019-03-22 ENCOUNTER — Encounter: Payer: Self-pay | Admitting: Oncology

## 2019-03-22 LAB — NOVEL CORONAVIRUS, NAA: SARS-CoV-2, NAA: NOT DETECTED

## 2019-03-22 NOTE — Progress Notes (Signed)
Patient pre screened for office appointment, no questions or concerns today. Patient states she is recovering from a recent pneumonia episode.

## 2019-03-23 ENCOUNTER — Other Ambulatory Visit: Payer: Self-pay

## 2019-03-23 ENCOUNTER — Inpatient Hospital Stay: Payer: Medicare Other | Admitting: Oncology

## 2019-03-23 ENCOUNTER — Inpatient Hospital Stay: Payer: Medicare Other | Attending: Oncology

## 2019-03-23 ENCOUNTER — Encounter: Payer: Self-pay | Admitting: Oncology

## 2019-03-23 VITALS — BP 145/70 | HR 79 | Temp 98.4°F | Resp 18 | Wt 155.3 lb

## 2019-03-23 DIAGNOSIS — D649 Anemia, unspecified: Secondary | ICD-10-CM | POA: Insufficient documentation

## 2019-03-23 DIAGNOSIS — I10 Essential (primary) hypertension: Secondary | ICD-10-CM | POA: Insufficient documentation

## 2019-03-23 DIAGNOSIS — R531 Weakness: Secondary | ICD-10-CM | POA: Diagnosis not present

## 2019-03-23 DIAGNOSIS — Z79899 Other long term (current) drug therapy: Secondary | ICD-10-CM | POA: Diagnosis not present

## 2019-03-23 DIAGNOSIS — Z170 Estrogen receptor positive status [ER+]: Secondary | ICD-10-CM | POA: Diagnosis not present

## 2019-03-23 DIAGNOSIS — E538 Deficiency of other specified B group vitamins: Secondary | ICD-10-CM | POA: Diagnosis not present

## 2019-03-23 DIAGNOSIS — M8580 Other specified disorders of bone density and structure, unspecified site: Secondary | ICD-10-CM | POA: Diagnosis not present

## 2019-03-23 DIAGNOSIS — Z803 Family history of malignant neoplasm of breast: Secondary | ICD-10-CM | POA: Diagnosis not present

## 2019-03-23 DIAGNOSIS — C50912 Malignant neoplasm of unspecified site of left female breast: Secondary | ICD-10-CM | POA: Diagnosis present

## 2019-03-23 DIAGNOSIS — Z794 Long term (current) use of insulin: Secondary | ICD-10-CM | POA: Diagnosis not present

## 2019-03-23 DIAGNOSIS — E559 Vitamin D deficiency, unspecified: Secondary | ICD-10-CM | POA: Insufficient documentation

## 2019-03-23 DIAGNOSIS — Z79811 Long term (current) use of aromatase inhibitors: Secondary | ICD-10-CM | POA: Insufficient documentation

## 2019-03-23 DIAGNOSIS — Z7982 Long term (current) use of aspirin: Secondary | ICD-10-CM | POA: Diagnosis not present

## 2019-03-23 DIAGNOSIS — C50911 Malignant neoplasm of unspecified site of right female breast: Secondary | ICD-10-CM | POA: Insufficient documentation

## 2019-03-23 DIAGNOSIS — Z923 Personal history of irradiation: Secondary | ICD-10-CM | POA: Diagnosis not present

## 2019-03-23 DIAGNOSIS — Z87891 Personal history of nicotine dependence: Secondary | ICD-10-CM | POA: Diagnosis not present

## 2019-03-23 DIAGNOSIS — Z9221 Personal history of antineoplastic chemotherapy: Secondary | ICD-10-CM | POA: Diagnosis not present

## 2019-03-23 DIAGNOSIS — Z8701 Personal history of pneumonia (recurrent): Secondary | ICD-10-CM | POA: Diagnosis not present

## 2019-03-23 DIAGNOSIS — D696 Thrombocytopenia, unspecified: Secondary | ICD-10-CM | POA: Insufficient documentation

## 2019-03-23 DIAGNOSIS — E1142 Type 2 diabetes mellitus with diabetic polyneuropathy: Secondary | ICD-10-CM | POA: Diagnosis not present

## 2019-03-23 DIAGNOSIS — K219 Gastro-esophageal reflux disease without esophagitis: Secondary | ICD-10-CM | POA: Insufficient documentation

## 2019-03-23 DIAGNOSIS — Z853 Personal history of malignant neoplasm of breast: Secondary | ICD-10-CM | POA: Diagnosis not present

## 2019-03-23 LAB — CBC WITH DIFFERENTIAL/PLATELET
Abs Immature Granulocytes: 0.02 10*3/uL (ref 0.00–0.07)
Basophils Absolute: 0 10*3/uL (ref 0.0–0.1)
Basophils Relative: 1 %
Eosinophils Absolute: 0.4 10*3/uL (ref 0.0–0.5)
Eosinophils Relative: 6 %
HCT: 31.9 % — ABNORMAL LOW (ref 36.0–46.0)
Hemoglobin: 10.5 g/dL — ABNORMAL LOW (ref 12.0–15.0)
Immature Granulocytes: 0 %
Lymphocytes Relative: 19 %
Lymphs Abs: 1.5 10*3/uL (ref 0.7–4.0)
MCH: 28.8 pg (ref 26.0–34.0)
MCHC: 32.9 g/dL (ref 30.0–36.0)
MCV: 87.6 fL (ref 80.0–100.0)
Monocytes Absolute: 0.5 10*3/uL (ref 0.1–1.0)
Monocytes Relative: 6 %
Neutro Abs: 5.3 10*3/uL (ref 1.7–7.7)
Neutrophils Relative %: 68 %
Platelets: 138 10*3/uL — ABNORMAL LOW (ref 150–400)
RBC: 3.64 MIL/uL — ABNORMAL LOW (ref 3.87–5.11)
RDW: 13.1 % (ref 11.5–15.5)
WBC: 7.8 10*3/uL (ref 4.0–10.5)
nRBC: 0 % (ref 0.0–0.2)

## 2019-03-23 LAB — COMPREHENSIVE METABOLIC PANEL
ALT: 31 U/L (ref 0–44)
AST: 20 U/L (ref 15–41)
Albumin: 4.2 g/dL (ref 3.5–5.0)
Alkaline Phosphatase: 76 U/L (ref 38–126)
Anion gap: 10 (ref 5–15)
BUN: 23 mg/dL (ref 8–23)
CO2: 26 mmol/L (ref 22–32)
Calcium: 9.2 mg/dL (ref 8.9–10.3)
Chloride: 99 mmol/L (ref 98–111)
Creatinine, Ser: 1.36 mg/dL — ABNORMAL HIGH (ref 0.44–1.00)
GFR calc Af Amer: 43 mL/min — ABNORMAL LOW (ref >60)
GFR calc non Af Amer: 37 mL/min — ABNORMAL LOW (ref >60)
Glucose, Bld: 167 mg/dL — ABNORMAL HIGH (ref 70–99)
Potassium: 4.5 mmol/L (ref 3.5–5.1)
Sodium: 135 mmol/L (ref 135–145)
Total Bilirubin: 0.9 mg/dL (ref 0.3–1.2)
Total Protein: 6.4 g/dL — ABNORMAL LOW (ref 6.5–8.1)

## 2019-03-23 LAB — VITAMIN B12: Vitamin B-12: 524 pg/mL (ref 180–914)

## 2019-03-23 NOTE — Progress Notes (Signed)
Pt in for 6 month follow up, reports was diagnosed with pneumonia 2 weeks ago.

## 2019-03-24 DIAGNOSIS — Z79811 Long term (current) use of aromatase inhibitors: Secondary | ICD-10-CM | POA: Insufficient documentation

## 2019-03-24 DIAGNOSIS — M8580 Other specified disorders of bone density and structure, unspecified site: Secondary | ICD-10-CM | POA: Insufficient documentation

## 2019-03-24 DIAGNOSIS — Z7689 Persons encountering health services in other specified circumstances: Secondary | ICD-10-CM | POA: Insufficient documentation

## 2019-03-24 LAB — CANCER ANTIGEN 27.29: CA 27.29: 29.8 U/mL (ref 0.0–38.6)

## 2019-03-24 NOTE — Progress Notes (Signed)
[facility] day:  03/24/2019  Chief Complaint: Kelsey Mason is a 78 y.o. female with bilateral stage IA breast cancer who is seen for 6 month assessment on Femara.  PERTINENT ONCOLOGY HISTORY Kelsey Mason is a 78 y.o.afemale who has above oncology history reviewed by me today presented for follow up visit for management of history of bilateral breast cancer. Patient previously follows up with Dr. Mike Gip. Switched care to me on 09/18/2018. Medical record review was performed by me.  bilateral breast cancer s/p lumpectomy and sentinel lymph node biopsy on 11/17/2012.  Right breast revealed a 5 mm grade I invasive carcinoma.  There was no DCIS.  Two sentinel lymph nodes were negative.  Pathologic stage was T1aN0M0.  Tumor was ER + (90%), PR + (20%), and Her2/neu -.   Left breast revealed a 1.8 cm grade III invasive ductal carcinoma.  There was lymphovascular invasion.  There was no DCIS.  One sentinel lymph node was negative.  Pathologic stage was T1cN0M0.  Tumor was ER + (>90%), PR + (1-5%), and Her2/neu -.  Oncotype DX testing on the left breast mass revealed a recurrence score of 28 which corresponded to a 10 year risk of distant recurrence of 19% (CI 15-23%) with tamoxifen alone.  BCI testing on 10/04/2017 revealed a high risk of late recurrence (5.4%; CI 2.5%-8.2%) during years 5-10.  There was a low likelihood of benefit from extended endocrine therapy.  She received Adriamycin and Cytoxan (AC) x 4 cycles followed by weekly Taxol x 10 (completed 06/05/2013).  She did not receive 2 cycles of Taxol secondary to progressive neuropathy.  She received bilateral breast radiation (07/2013 - 09/2013).  She began letrozole (Femara) in 09/2013.   Bilateral diagnostic mammogram on 03/22/2016 revealed  no evidence of recurrent disease.  Bilateral diagnostic mammogram on 03/31/2017 revealed no evidence of malignancy.  CA27.29 has been  followed:  24.4 on 09/10/2016, 28.1 on 03/14/2017, 24.9 on 09/12/2017, and 22.8 on 03/13/2018.  Bone density on 03/21/2015 was normal with a T score of -0.5 in L1-L4 and -0.8 in the left femur.  She has a history of a borderline mild normocytic anemia.  Diet appears modest.  She denies any melena, hematochezia, hematuria or vaginal bleeding. Colonoscopy on 10/13/2015 was negative.    Soft tissue neck ultrasound on 03/16/2017 revealed no fluid collection or soft tissue lesion.   INTERVAL HISTORY Kelsey Mason is a 78 y.o. female who has above history reviewed by me today presents for follow up visit for management of bilateral breast cancer. Problems and complaints are listed below: Patient reports doing not feeling well today.  Recently had pneumonia, still feels weak, cough is better, no fever. Denies SOB.  She takes letrozole daily. Reports tolerating well, manageable vasomotor symptoms.  She is due for annual mammogram which she is schedule to have done in Oct.2020.  Denies any  concerns of her breast.  .  Review of Systems  Constitutional: Negative for appetite change, chills, fatigue and fever.  HENT:   Negative for hearing loss and voice change.   Eyes: Negative for eye problems.  Respiratory: Negative for chest tightness and cough.   Cardiovascular: Negative for chest pain.  Gastrointestinal: Negative for abdominal distention, abdominal pain and blood in stool.  Endocrine: Negative for hot flashes.  Genitourinary: Negative for difficulty urinating and frequency.   Musculoskeletal: Negative for arthralgias.  Skin: Negative for itching and rash.  Neurological: Negative for extremity weakness.  Hematological: Negative for adenopathy.  Psychiatric/Behavioral: Negative for confusion.     Past Medical History:  Diagnosis Date  . Breast symptom 2014  . Cancer [facility]) 2014   Bilateral Breast, chemo Dr. Jeb Levering  . Diabetes mellitus   . GERD (gastroesophageal reflux  disease)    mild  . Hypertension   . Personal history of chemotherapy   . Personal history of radiation therapy 2014   bilat lumpectomy    Past Surgical History:  Procedure Laterality Date  . ABDOMINAL AORTOGRAM W/LOWER EXTREMITY N/A 04/21/2018   Procedure: ABDOMINAL AORTOGRAM W/LOWER EXTREMITY;  Surgeon: Nelva Bush, MD;  Location: [facility];  Service: Cardiovascular;  Laterality: N/A;  . ABDOMINAL HYSTERECTOMY  1976   for pelvic pain  . BREAST EXCISIONAL BIOPSY Bilateral 11/2012   bilat breast ca chemo and rad  . BREAST SURGERY Bilateral 2014   bilateral lumpectomy and SN biopsy  . CHOLECYSTECTOMY  2007  . COLONOSCOPY WITH PROPOFOL N/A 10/13/2015   Procedure: COLONOSCOPY WITH PROPOFOL;  Surgeon: Manya Silvas, MD;  Location: [facility] ENDOSCOPY;  Service: Endoscopy;  Laterality: N/A;  . LEFT HEART CATH AND CORONARY ANGIOGRAPHY N/A 04/21/2018   Procedure: LEFT HEART CATH AND CORONARY ANGIOGRAPHY;  Surgeon: Nelva Bush, MD;  Location: [facility];  Service: Cardiovascular;  Laterality: N/A;  . OOPHORECTOMY Right   . PORTACATH PLACEMENT  2014  . TUMOR REMOVAL Right 1980   right foot    Family History  Problem Relation Age of Onset  . Alcohol abuse Mother   . Heart disease Mother   . Hypertension Mother   . Breast cancer Mother 44  . Alcohol abuse Father   . Heart disease Father   . Hypertension Father   . Heart disease Brother   . Heart attack Brother    Social History   Socioeconomic History  . Marital status: Widowed    Spouse name: Not on file  . Number of children: Not on file  . Years of education: Not on file  . Highest education level: Not on file  Occupational History  . Not on file  Social Needs  . Financial resource strain: Not hard at all  . Food insecurity    Worry: Not on file    Inability: Not on file  . Transportation needs    Medical: No    Non-medical: No  Tobacco Use  . Smoking status: Former Smoker    Packs/day:  0.25    Years: 1.00    Pack years: 0.25    Types: Cigarettes    Quit date: 1963    Years since quitting: 57.7  . Smokeless tobacco: Never Used  Substance and Sexual Activity  . Alcohol use: No    Alcohol/week: 0.0 standard drinks  . Drug use: No  . Sexual activity: Never  Lifestyle  . Physical activity    Days per week: 2 days    Minutes per session: 150+ min  . Stress: Not at all  Relationships  . Social Herbalist on phone: Not on file    Gets together: Not on file    Attends religious service: Not on file    Active member of club or organization: Not on file    Attends meetings of clubs or organizations: Not on file    Relationship status: Not on file  . Intimate partner violence    Fear of current or ex partner: Not on file    Emotionally abused: Not on  file    Physically abused: Not on file    Forced sexual activity: Not on file  Other Topics Concern  . Not on file  Social History Narrative   Lives in [place].       Works - [employer]   Diet - regular   Exercise - dance 3x per week     She lives by herself in [place].  Husband passed away many years ago.  The patient is alone today.  Allergies:  Allergies  Allergen Reactions  . Advil [Ibuprofen] Other (See Comments)    Dizziness   . Aleve [Naproxen] Other (See Comments)    Dizziness  . Fluticasone-Salmeterol Other (See Comments)    dizziness  . Lipitor [Atorvastatin] Other (See Comments)    Legs weak.  Leg pain, muscle ache  . Naproxen Sodium Other (See Comments)  . Penicillins Hives and Other (See Comments)    Has patient had a PCN reaction causing immediate rash, facial/tongue/throat swelling, SOB or lightheadedness with hypotension: YES Has patient had a PCN reaction causing severe rash involving mucus membranes or skin necrosis: no Has patient had a PCN reaction that required hospitalization: no Has patient had a PCN reaction occurring within the last 10 years: no If all of the  above answers are "NO", then may proceed with Cephalosporin use.     Current Medications: Current Outpatient Medications  Medication Sig Dispense Refill  . aspirin 81 MG tablet Take 81 mg by mouth daily.    . blood glucose meter kit and supplies KIT Dispense based on patient and insurance preference. Use up to four times daily as directed. One Touch Ultra mini meter (FOR ICD-9 250.00, 250.01). Dx: E11.9 1 each 0  . furosemide (LASIX) 20 MG tablet Take 1 tablet (20 mg total) by mouth daily. 90 tablet 3  . glimepiride (AMARYL) 2 MG tablet TAKE 1 TABLET (2 MG TOTAL) BY MOUTH 2 (TWO) TIMES DAILY. 180 tablet 1  . glucose blood (ONE TOUCH ULTRA TEST) test strip USE TO TEST BLOOD SUGAR UP TO 4 TIMES DAILY 100 each 6  . LANTUS SOLOSTAR 100 UNIT/ML Solostar Pen INJECT 20 UNITS INTO THE SKIN DAILY AT 10 PM. (Patient taking differently: Inject 28 Units into the skin at bedtime. ) 18 pen 0  . letrozole (FEMARA) 2.5 MG tablet TAKE 1 TABLET BY MOUTH EVERY DAY 90 tablet 0  . losartan (COZAAR) 100 MG tablet Take 0.5 tablets (50 mg total) by mouth 2 (two) times daily. Take 1 tablet (50 mg) by mouth twice daily 90 tablet 1  . NOVOFINE 32G X 6 MM MISC USE AS DIRECTED WITH LANTUS PEN 100 each 2  . rosuvastatin (CRESTOR) 5 MG tablet TAKE 1 TABLET BY MOUTH EVERY DAY 90 tablet 3  . TRULICITY 1.5 HY/8.6VH SOPN Inject 1.5 mg as directed once a week.  11  . loratadine-pseudoephedrine (CLARITIN-D 24-HOUR) 10-240 MG 24 hr tablet Take 1 tablet by mouth daily.    . nitroGLYCERIN (NITROSTAT) 0.4 MG SL tablet Place 1 tablet (0.4 mg total) under the tongue every 5 (five) minutes as needed for chest pain. Maximum of 3 doses. (Patient not taking: Reported on 03/22/2019) 25 tablet 3   No current facility-administered medications for this visit.    Physical Exam: Blood pressure (!) 145/70, pulse 79, temperature 98.4 F (36.9 C), temperature source Tympanic, resp. rate 18, weight 155 lb 4.8 oz (70.4 kg). Physical Exam   Constitutional: She is oriented to person, place, and time. No distress.  HENT:  Head: Normocephalic and atraumatic.  Mouth/Throat: No oropharyngeal exudate.  Eyes: Pupils are equal, round, and reactive to light. EOM are normal. No scleral icterus.  Neck: Normal range of motion. Neck supple.  Cardiovascular: Normal rate and regular rhythm.  No murmur heard. Pulmonary/Chest: Effort normal. No respiratory distress. She has no rales. She exhibits no tenderness.  Abdominal: Soft. She exhibits no distension. There is no abdominal tenderness.  Musculoskeletal: Normal range of motion.        General: No edema.  Neurological: She is alert and oriented to person, place, and time. No cranial nerve deficit. She exhibits normal muscle tone. Coordination normal.  Skin: Skin is warm and dry. She is not diaphoretic. No erythema.  Psychiatric: Affect normal.  she declines breast exam today.      Appointment on 03/23/2019  Component Date Value Ref Range Status  . Vitamin B-12 03/23/2019 524  180 - 914 pg/mL Final   Comment: (NOTE) This assay is not validated for testing neonatal or myeloproliferative syndrome specimens for Vitamin B12 levels. Performed at [facility], [place] [address], [place], [place] [address]   . Sodium 03/23/2019 135  135 - 145 mmol/L Final  . Potassium 03/23/2019 4.5  3.5 - 5.1 mmol/L Final  . Chloride 03/23/2019 99  98 - 111 mmol/L Final  . CO2 03/23/2019 26  22 - 32 mmol/L Final  . Glucose, Bld 03/23/2019 167* 70 - 99 mg/dL Final  . BUN 03/23/2019 23  8 - 23 mg/dL Final  . Creatinine, Ser 03/23/2019 1.36* 0.44 - 1.00 mg/dL Final  . Calcium 03/23/2019 9.2  8.9 - 10.3 mg/dL Final  . Total Protein 03/23/2019 6.4* 6.5 - 8.1 g/dL Final  . Albumin 03/23/2019 4.2  3.5 - 5.0 g/dL Final  . AST 03/23/2019 20  15 - 41 U/L Final  . ALT 03/23/2019 31  0 - 44 U/L Final  . Alkaline Phosphatase 03/23/2019 76  38 - 126 U/L Final  . Total Bilirubin 03/23/2019 0.9  0.3 - 1.2 mg/dL  Final  . GFR calc non Af Amer 03/23/2019 37* >60 mL/min Final  . GFR calc Af Amer 03/23/2019 43* >60 mL/min Final  . Anion gap 03/23/2019 10  5 - 15 Final   Performed at [facility], [address] [place], [place] [address]  . WBC 03/23/2019 7.8  4.0 - 10.5 K/uL Final  . RBC 03/23/2019 3.64* 3.87 - 5.11 MIL/uL Final  . Hemoglobin 03/23/2019 10.5* 12.0 - 15.0 g/dL Final  . HCT 03/23/2019 31.9* 36.0 - 46.0 % Final  . MCV 03/23/2019 87.6  80.0 - 100.0 fL Final  . MCH 03/23/2019 28.8  26.0 - 34.0 pg Final  . MCHC 03/23/2019 32.9  30.0 - 36.0 g/dL Final  . RDW 03/23/2019 13.1  11.5 - 15.5 % Final  . Platelets 03/23/2019 138* 150 - 400 K/uL Final  . nRBC 03/23/2019 0.0  0.0 - 0.2 % Final  . Neutrophils Relative % 03/23/2019 68  % Final  . Neutro Abs 03/23/2019 5.3  1.7 - 7.7 K/uL Final  . Lymphocytes Relative 03/23/2019 19  % Final  . Lymphs Abs 03/23/2019 1.5  0.7 - 4.0 K/uL Final  . Monocytes Relative 03/23/2019 6  % Final  . Monocytes Absolute 03/23/2019 0.5  0.1 - 1.0 K/uL Final  . Eosinophils Relative 03/23/2019 6  % Final  . Eosinophils Absolute 03/23/2019 0.4  0.0 - 0.5 K/uL Final  . Basophils Relative 03/23/2019 1  % Final  .  Basophils Absolute 03/23/2019 0.0  0.0 - 0.1 K/uL Final  . Immature Granulocytes 03/23/2019 0  % Final  . Abs Immature Granulocytes 03/23/2019 0.02  0.00 - 0.07 K/uL Final   Performed at [facility], [address], [place], [place] [address]  . CA 27.29 03/23/2019 29.8  0.0 - 38.6 U/mL Final   Comment: (NOTE) Siemens Centaur Immunochemiluminometric Methodology [facility]) Values obtained with different assay methods or kits cannot be used interchangeably. Results cannot be interpreted as absolute evidence of the presence or absence of malignant disease. Performed At: [facility] [address] [place], [place] [id number] Rush Farmer MD CN:[id number]    RADIOGRAPHIC STUDIES: I have personally reviewed the radiological images as  listed and agreed with the findings in the report. Dg Chest 2 View  Result Date: 03/08/2019 CLINICAL DATA:  Cough, fever. EXAM: CHEST - 2 VIEW COMPARISON:  Radiographs of December 22, 2014. FINDINGS: The heart size and mediastinal contours are within normal limits. No pneumothorax or pleural effusion is noted. Left lung is clear. Mild right perihilar and middle lobe opacity is noted concerning for possible pneumonia. The visualized skeletal structures are unremarkable. IMPRESSION: Right middle lobe opacity is noted concerning for pneumonia. Followup PA and lateral chest X-ray is recommended in 3-4 weeks following trial of antibiotic therapy to ensure resolution and exclude underlying malignancy. Electronically Signed   By: Marijo Conception M.D.   On: 03/08/2019 14:59    Assessment:  Kelsey Mason is a 78 y.o. female presents for follow-up of bilateral stage I breast cancer.  1. History of breast cancer   2. Aromatase inhibitor use   3. Osteopenia, unspecified location   4. B12 deficiency   5. Normocytic anemia   6. Thrombocytopenia ([place])   # History of breast cancer- 2014 Previously discussed and patient desires to extend letrozole.  Doing well and tolerating.  Continue Letrozole 2.30m daily.  She is over due for annual mammogram. Scheduled in Oct per patient.   # Osteopenia,  Bone density 10/18/2017 was reviewed T score -2.3, patient is considered osteopenic according to WHO criteria. Recommend patient to start taking calcium 1000 mg daily with vitamin D supplements. Continue oral vitamin D and calcium supplementation.  Previously discussed about starting bisphosphonate and recommend patient to obtain dental clearance. She has not yet obtain clearance yet.   # Vitamin D deficiency, she takes Vitamin D 1000 unit daily. Repeat vitamin D level shows improvement.  # History of low vitamin B12 levels, B12 level is 524. Continue monitor.  # Thrombocytopenia, mild, grade 1 can be reactive due  to recent infection. Monitor.  # Anemia, hemoglobin dropped. ? Reactive to her recent infection. Monitor. Repeat in 3 months.    RTC in 3 months for MD assessment and labs   ZEarlie Server MD, PhD Hematology Oncology CDaniels Memorial Hospitalat AFirsthealth Richmond Memorial HospitalPager- [phone]/19/2020

## 2019-04-05 ENCOUNTER — Ambulatory Visit: Payer: Medicare Other

## 2019-04-09 ENCOUNTER — Ambulatory Visit: Payer: Medicare Other

## 2019-04-10 ENCOUNTER — Ambulatory Visit (INDEPENDENT_AMBULATORY_CARE_PROVIDER_SITE_OTHER): Payer: Medicare Other

## 2019-04-10 ENCOUNTER — Other Ambulatory Visit: Payer: Self-pay

## 2019-04-10 ENCOUNTER — Telehealth: Payer: Self-pay | Admitting: *Deleted

## 2019-04-10 DIAGNOSIS — R509 Fever, unspecified: Secondary | ICD-10-CM

## 2019-04-10 NOTE — Telephone Encounter (Signed)
Noted. Please contact the patient and offer her an appointment to evaluate this further. This could be related a number of different things and a visit will allow [place] to look for a specific cause. Thanks.

## 2019-04-10 NOTE — Telephone Encounter (Signed)
Gae Bon, please hold off on calling her until her Chest x-ray report is back to give her everything at once. Thanks (see below)

## 2019-04-10 NOTE — Telephone Encounter (Signed)
Pt came in for a follow-up CXR & reported that she has no energy or appetite & while changing clothes today she noticed hair loss. There was a small handful of hair in the xray floor today. Pt mentioned during her visit that she hopes that her cancer has not returned.  I just wanted to send a message in case additional testing is necessary.

## 2019-04-11 NOTE — Telephone Encounter (Signed)
Patient informed of chest xray & she has appointment scheduled with you Tuesday at 11:30.

## 2019-04-11 NOTE — Telephone Encounter (Signed)
Noted  

## 2019-04-17 ENCOUNTER — Ambulatory Visit (INDEPENDENT_AMBULATORY_CARE_PROVIDER_SITE_OTHER): Payer: Medicare Other | Admitting: Family Medicine

## 2019-04-17 ENCOUNTER — Ambulatory Visit (INDEPENDENT_AMBULATORY_CARE_PROVIDER_SITE_OTHER): Payer: Medicare Other

## 2019-04-17 ENCOUNTER — Encounter: Payer: Self-pay | Admitting: Family Medicine

## 2019-04-17 ENCOUNTER — Other Ambulatory Visit: Payer: Self-pay

## 2019-04-17 VITALS — BP 135/68 | Ht 63.0 in | Wt 158.0 lb

## 2019-04-17 DIAGNOSIS — N1832 Chronic kidney disease, stage 3b: Secondary | ICD-10-CM

## 2019-04-17 DIAGNOSIS — E785 Hyperlipidemia, unspecified: Secondary | ICD-10-CM | POA: Diagnosis not present

## 2019-04-17 DIAGNOSIS — M545 Low back pain, unspecified: Secondary | ICD-10-CM

## 2019-04-17 DIAGNOSIS — Z0000 Encounter for general adult medical examination without abnormal findings: Secondary | ICD-10-CM | POA: Diagnosis not present

## 2019-04-17 DIAGNOSIS — M533 Sacrococcygeal disorders, not elsewhere classified: Secondary | ICD-10-CM

## 2019-04-17 DIAGNOSIS — Z794 Long term (current) use of insulin: Secondary | ICD-10-CM

## 2019-04-17 DIAGNOSIS — E1121 Type 2 diabetes mellitus with diabetic nephropathy: Secondary | ICD-10-CM | POA: Diagnosis not present

## 2019-04-17 DIAGNOSIS — I10 Essential (primary) hypertension: Secondary | ICD-10-CM

## 2019-04-17 DIAGNOSIS — G8929 Other chronic pain: Secondary | ICD-10-CM

## 2019-04-17 DIAGNOSIS — E1122 Type 2 diabetes mellitus with diabetic chronic kidney disease: Secondary | ICD-10-CM

## 2019-04-17 NOTE — Progress Notes (Signed)
Virtual Visit via telephone Note  This visit type was conducted due to national recommendations for restrictions regarding the COVID-19 pandemic (e.g. social distancing).  This format is felt to be most appropriate for this patient at this time.  All issues noted in this document were discussed and addressed.  No physical exam was performed (except for noted visual exam findings with Video Visits).   I connected with Kelsey Mason today at 11:30 AM EDT by telephone and verified that I am speaking with the correct person using two identifiers. Location patient: home Location provider: work Persons participating in the virtual visit: patient, provider  I discussed the limitations, risks, security and privacy concerns of performing an evaluation and management service by telephone and the availability of in person appointments. I also discussed with the patient that there may be a patient responsible charge related to this service. The patient expressed understanding and agreed to proceed.  Interactive audio and video telecommunications were attempted between this provider and patient, however failed, due to patient having technical difficulties OR patient did not have access to video capability.  We continued and completed visit with audio only.   Reason for visit: follow-up  HPI: Diabetes: Most recent A1c 6.8.  This is managed by her endocrinologist.  She is currently on Lantus 28 units daily.  Also taking glimepiride and Trulicity.  No polyuria or polydipsia.  Her Lantus dose was decreased by endocrinology related to hypoglycemia.  Patient notes since making that decrease in her Lantus her lowest CBG has been 71.  Hyperlipidemia: Taking Crestor.  No right upper quadrant pain.  Hypertension: Reports this has been adequately controlled.  Taking losartan and Lasix.  No chest pain, shortness of breath, or significant edema.  Low back pain: This has been going on for at least a year now.   She has had to give up mowing her yard related to this.  Starts just above her tailbone and then goes halfway up her back.  Typically bothers her when she is up moving around doing something.  No radicular symptoms.  No numbness, weakness, or incontinence.  No prior imaging.   ROS: See pertinent positives and negatives per HPI.  Past Medical History:  Diagnosis Date  . Breast symptom 2014  . Cancer [facility]) 2014   Bilateral Breast, chemo Dr. Jeb Levering  . Diabetes mellitus   . GERD (gastroesophageal reflux disease)    mild  . Hypertension   . Personal history of chemotherapy   . Personal history of radiation therapy 2014   bilat lumpectomy    Past Surgical History:  Procedure Laterality Date  . ABDOMINAL AORTOGRAM W/LOWER EXTREMITY N/A 04/21/2018   Procedure: ABDOMINAL AORTOGRAM W/LOWER EXTREMITY;  Surgeon: Nelva Bush, MD;  Location: [place] [facility];  Service: Cardiovascular;  Laterality: N/A;  . ABDOMINAL HYSTERECTOMY  1976   for pelvic pain  . BREAST EXCISIONAL BIOPSY Bilateral 11/2012   bilat breast ca chemo and rad  . BREAST SURGERY Bilateral 2014   bilateral lumpectomy and SN biopsy  . CHOLECYSTECTOMY  2007  . COLONOSCOPY WITH PROPOFOL N/A 10/13/2015   Procedure: COLONOSCOPY WITH PROPOFOL;  Surgeon: Manya Silvas, MD;  Location: [facility] ENDOSCOPY;  Service: Endoscopy;  Laterality: N/A;  . LEFT HEART CATH AND CORONARY ANGIOGRAPHY N/A 04/21/2018   Procedure: LEFT HEART CATH AND CORONARY ANGIOGRAPHY;  Surgeon: Nelva Bush, MD;  Location: [place] [facility];  Service: Cardiovascular;  Laterality: N/A;  . OOPHORECTOMY Right   . PORTACATH PLACEMENT  2014  .  TUMOR REMOVAL Right 1980   right foot    Family History  Problem Relation Age of Onset  . Alcohol abuse Mother   . Heart disease Mother   . Hypertension Mother   . Breast cancer Mother 24  . Alcohol abuse Father   . Heart disease Father   . Hypertension Father   . Heart disease Brother   . Heart attack  Brother     SOCIAL HX: Former smoker   Current Outpatient Medications:  .  aspirin 81 MG tablet, Take 81 mg by mouth daily., Disp: , Rfl:  .  blood glucose meter kit and supplies KIT, Dispense based on patient and insurance preference. Use up to four times daily as directed. One Touch Ultra mini meter (FOR ICD-9 250.00, 250.01). Dx: E11.9, Disp: 1 each, Rfl: 0 .  cholecalciferol (VITAMIN D) 25 MCG (1000 UT) tablet, Take 1,000 Units by mouth daily., Disp: , Rfl:  .  furosemide (LASIX) 20 MG tablet, Take 1 tablet (20 mg total) by mouth daily., Disp: 90 tablet, Rfl: 3 .  glimepiride (AMARYL) 2 MG tablet, TAKE 1 TABLET (2 MG TOTAL) BY MOUTH 2 (TWO) TIMES DAILY., Disp: 180 tablet, Rfl: 1 .  glucose blood (ONE TOUCH ULTRA TEST) test strip, USE TO TEST BLOOD SUGAR UP TO 4 TIMES DAILY, Disp: 100 each, Rfl: 6 .  LANTUS SOLOSTAR 100 UNIT/ML Solostar Pen, INJECT 20 UNITS INTO THE SKIN DAILY AT 10 PM. (Patient taking differently: Inject 28 Units into the skin at bedtime. ), Disp: 18 pen, Rfl: 0 .  letrozole (FEMARA) 2.5 MG tablet, TAKE 1 TABLET BY MOUTH EVERY DAY, Disp: 90 tablet, Rfl: 0 .  loratadine-pseudoephedrine (CLARITIN-D 24-HOUR) 10-240 MG 24 hr tablet, Take 1 tablet by mouth daily., Disp: , Rfl:  .  losartan (COZAAR) 100 MG tablet, Take 0.5 tablets (50 mg total) by mouth 2 (two) times daily. Take 1 tablet (50 mg) by mouth twice daily, Disp: 90 tablet, Rfl: 1 .  nitroGLYCERIN (NITROSTAT) 0.4 MG SL tablet, Place 1 tablet (0.4 mg total) under the tongue every 5 (five) minutes as needed for chest pain. Maximum of 3 doses., Disp: 25 tablet, Rfl: 3 .  NOVOFINE 32G X 6 MM MISC, USE AS DIRECTED WITH LANTUS PEN, Disp: 100 each, Rfl: 2 .  rosuvastatin (CRESTOR) 5 MG tablet, TAKE 1 TABLET BY MOUTH EVERY DAY, Disp: 90 tablet, Rfl: 3 .  TRULICITY 1.5 HW/2.9HB SOPN, Inject 1.5 mg as directed once a week., Disp: , Rfl: 11 .  vitamin B-12 (CYANOCOBALAMIN) 1000 MCG tablet, Take 1,000 mcg by mouth daily., Disp: ,  Rfl:   EXAM: This was a telehealth telephone visit and thus no physical exam was completed.   ASSESSMENT AND PLAN:  Discussed the following assessment and plan:  Chronic bilateral low back pain without sciatica Patient with chronic low back pain.  We will start with x-rays to evaluate for potential cause.  Given her history of breast cancer she will likely need a MRI though we will await the x-ray results.  She denies having a pacemaker or having metal anywhere in her body.  Hyperlipidemia LDL goal <70 Continue Crestor.  Plan to check labs with her next follow-up visit.  Type 2 diabetes mellitus with stage 3 chronic kidney disease, with long-term current use of insulin (HCC) Improving control.  She will continue to follow with endocrinology.  Essential hypertension Reasonably controlled given her age.  She will continue her current regimen.    I discussed the assessment  and treatment plan with the patient. The patient was provided an opportunity to ask questions and all were answered. The patient agreed with the plan and demonstrated an understanding of the instructions.   The patient was advised to call back or seek an in-person evaluation if the symptoms worsen or if the condition fails to improve as anticipated.  I provided 11 minutes of non-face-to-face time during this encounter.   [name], MD

## 2019-04-17 NOTE — Assessment & Plan Note (Signed)
Improving control.  She will continue to follow with endocrinology.

## 2019-04-17 NOTE — Progress Notes (Signed)
I have reviewed the above note and agree.  Karmelo Bass, M.D.  

## 2019-04-17 NOTE — Assessment & Plan Note (Signed)
Continue Crestor.  Plan to check labs with her next follow-up visit.

## 2019-04-17 NOTE — Assessment & Plan Note (Signed)
Patient with chronic low back pain.  We will start with x-rays to evaluate for potential cause.  Given her history of breast cancer she will likely need a MRI though we will await the x-ray results.  She denies having a pacemaker or having metal anywhere in her body.

## 2019-04-17 NOTE — Progress Notes (Signed)
Subjective:   Kelsey Mason is a 78 y.o. female who presents for Medicare Annual (Subsequent) preventive examination.  Review of Systems:  No ROS.  Medicare Wellness Virtual Visit.  Visual/audio telehealth visit, UTA vital signs.   See social history for additional risk factors.   Cardiac Risk Factors include: advanced age (>89mn, >>23women);hypertension;diabetes mellitus     Objective:     Vitals: There were no vitals taken for this visit.  There is no height or weight on file to calculate BMI.  Advanced Directives 04/17/2019 03/22/2019 09/18/2018 04/21/2018 04/11/2018 03/13/2018 09/12/2017  Does Patient Have a Medical Advance Directive? _0  Yes No  Type of Advance Directive Living will;Healthcare Power of [place] Waters[place] will Living will;Healthcare Power of [place] will - -  Does patient want to make changes to medical advance directive? No - Patient declined No - Patient declined - No - Patient declined No - Patient declined - -  Copy of [place]in Chart? Yes - validated most recent copy scanned in chart (See row information) No - copy requested Yes - validated most recent copy scanned in chart (See row information) Yes Yes - -  Would patient like information on creating a medical advance directive? - - - - - - No - Patient declined    Tobacco Social History   Tobacco Use  Smoking Status Former Smoker   Packs/day: 0.25   Years: 1.00   Pack years: 0.25   Types: Cigarettes   Quit date: 1963   Years since quitting: 57.8  Smokeless Tobacco Never Used     Counseling given: Not Answered   Clinical Intake:  Pre-visit preparation completed: Yes        Diabetes: Yes(Followed by Endocrinology, MLavone Orn  How often do you need to have someone help you when you read instructions, pamphlets, or other written  materials from your doctor or pharmacy?: 1 - Never  Interpreter Needed?: No     Past Medical History:  Diagnosis Date   Breast symptom 2014   [place]   Bilateral Breast, chemo Dr. CJeb Levering  Diabetes mellitus    GERD (gastroesophageal reflux disease)    mild   Hypertension    Personal history of chemotherapy    Personal history of radiation therapy 2014   bilat lumpectomy   Past Surgical History:  Procedure Laterality Date   ABDOMINAL AORTOGRAM W/LOWER EXTREMITY N/A 04/21/2018   Procedure: ABDOMINAL AORTOGRAM W/LOWER EXTREMITY;  Surgeon: ENelva Bush MD;  [facility];  Service: Cardiovascular;  Laterality: N/A;   ABDOMINAL HYSTERECTOMY  1976   for pelvic pain   BREAST EXCISIONAL BIOPSY Bilateral 11/2012   bilat breast ca chemo and rad   BREAST SURGERY Bilateral 2014   bilateral lumpectomy and SN biopsy   CHOLECYSTECTOMY  2007   COLONOSCOPY WITH PROPOFOL N/A 10/13/2015   Procedure: COLONOSCOPY WITH PROPOFOL;  Surgeon: RManya Silvas MD;  [facility];  Service: Endoscopy;  Laterality: N/A;   LEFT HEART CATH AND CORONARY ANGIOGRAPHY N/A 04/21/2018   Procedure: LEFT HEART CATH AND CORONARY ANGIOGRAPHY;  Surgeon: ENelva Bush MD;  [facility];  Service: Cardiovascular;  Laterality: N/A;   OOPHORECTOMY Right    PORTACATH PLACEMENT  2014   TUMOR REMOVAL Right 1980   right foot   Family History  Problem Relation Age of Onset   Alcohol abuse  Mother    Heart disease Mother    Hypertension Mother    Breast cancer Mother 28   Alcohol abuse Father    Heart disease Father    Hypertension Father    Heart disease Brother    Heart attack Brother    Social History   Socioeconomic History   Marital status: Widowed    Spouse name: Not on file   Number of children: Not on file   Years of education: Not on file   Highest education level: Not on file  Occupational History   Not on  file  Social Needs   Financial resource strain: Not hard at all   Food insecurity    Worry: Never true    Inability: Never true   Transportation needs    Medical: No    Non-medical: No  Tobacco Use   Smoking status: Former Smoker    Packs/day: 0.25    Years: 1.00    Pack years: 0.25    Types: Cigarettes    Quit date: 1963    Years since quitting: 57.8   Smokeless tobacco: Never Used  Substance and Sexual Activity   Alcohol use: No    Alcohol/week: 0.0 standard drinks   Drug use: No   Sexual activity: Never  Lifestyle   Physical activity    Days per week: 2 days    Minutes per session: 150+ min   Stress: Not at all  Relationships   Social connections    Talks on phone: Not on file    Gets together: Not on file    Attends religious service: Not on file    Active member of club or organization: Not on file    Attends meetings of clubs or organizations: Not on file    Relationship status: Not on file  Other Topics Concern   Not on file  Social History Narrative   Lives in [place].       Works - [employer]   Diet - regular   Exercise - dance 3x per week    Outpatient Encounter Medications as of 04/17/2019  Medication Sig   aspirin 81 MG tablet Take 81 mg by mouth daily.   blood glucose meter kit and supplies KIT Dispense based on patient and insurance preference. Use up to four times daily as directed. One Touch Ultra mini meter (FOR ICD-9 250.00, 250.01). Dx: E11.9   cholecalciferol (VITAMIN D) 25 MCG (1000 UT) tablet Take 1,000 Units by mouth daily.   furosemide (LASIX) 20 MG tablet Take 1 tablet (20 mg total) by mouth daily.   glimepiride (AMARYL) 2 MG tablet TAKE 1 TABLET (2 MG TOTAL) BY MOUTH 2 (TWO) TIMES DAILY.   glucose blood (ONE TOUCH ULTRA TEST) test strip USE TO TEST BLOOD SUGAR UP TO 4 TIMES DAILY   LANTUS SOLOSTAR 100 UNIT/ML Solostar Pen INJECT 20 UNITS INTO THE SKIN DAILY AT 10 PM. (Patient taking differently: Inject 28 Units  into the skin at bedtime. )   letrozole (FEMARA) 2.5 MG tablet TAKE 1 TABLET BY MOUTH EVERY DAY   loratadine-pseudoephedrine (CLARITIN-D 24-HOUR) 10-240 MG 24 hr tablet Take 1 tablet by mouth daily.   losartan (COZAAR) 100 MG tablet Take 0.5 tablets (50 mg total) by mouth 2 (two) times daily. Take 1 tablet (50 mg) by mouth twice daily   nitroGLYCERIN (NITROSTAT) 0.4 MG SL tablet Place 1 tablet (0.4 mg total) under the tongue every 5 (five) minutes as needed for chest pain. Maximum  of 3 doses.   NOVOFINE 32G X 6 MM MISC USE AS DIRECTED WITH LANTUS PEN   rosuvastatin (CRESTOR) 5 MG tablet TAKE 1 TABLET BY MOUTH EVERY DAY   TRULICITY 1.5 VV/6.1YW SOPN Inject 1.5 mg as directed once a week.   vitamin B-12 (CYANOCOBALAMIN) 1000 MCG tablet Take 1,000 mcg by mouth daily.   No facility-administered encounter medications on file as of 04/17/2019.     Activities of Daily Living In your present state of health, do you have any difficulty performing the following activities: 04/17/2019  Hearing? N  Vision? N  Difficulty concentrating or making decisions? N  Walking or climbing stairs? N  Dressing or bathing? N  Doing errands, shopping? N  Preparing Food and eating ? N  Using the Toilet? N  In the past six months, have you accidently leaked urine? N  Do you have problems with loss of bowel control? N  Managing your Medications? N  Managing your Finances? N  Housekeeping or managing your Housekeeping? N  Some recent data might be hidden    Patient Care Team: Leone Haven, MD as PCP - General (Family Medicine) End, Harrell Gave, MD as PCP - Cardiology (Cardiology) Christene Lye, MD as Consulting Physician (General Surgery) Gabriel Carina Betsey Holiday, MD as Physician Assistant (Endocrinology)    Assessment:   This is a routine wellness examination for Arieonna.  I connected with patient 04/17/19 at 10:00 AM EDT by a video/audio enabled telemedicine application and verified that I am  speaking with the correct person using two identifiers. Patient stated full name and DOB. Patient gave permission to continue with virtual visit. Patient's location was at home and Nurse's location was at [facility] office.   Health Maintenance Due: -Influenza vaccine 2020- discussed; to be completed in season with doctor or local pharmacy.   Update all pending maintenance due as appropriate.   See completed HM at the end of note.   Eye: Visual acuity not assessed. Virtual visit. Wears corrective lenses. Followed by their ophthalmologist every 12 months.  Retinopathy- yes  Dental: Visits every 6 months.    Hearing: Demonstrates normal hearing during visit.  Safety:  Patient feels safe at home- yes Patient does have smoke detectors at home- yes Patient does wear sunscreen or protective clothing when in direct sunlight - yes Patient does wear seat belt when in a moving vehicle - yes Patient drives- yes Adequate lighting in walkways free from debris- yes Grab bars and handrails used as appropriate- yes Ambulates with no assistive device Cell phone on person when ambulating outside of the home- yes  Social: Alcohol intake - no      Smoking history- former   Smokers in home? none Illicit drug use? none  Depression: PHQ 2 &9 complete. See screening below. Denies irritability, anhedonia, sadness/tearfullness.  Stable.   Falls: See screening below.    Medication: Taking as directed and without issues.   Covid-19: Precautions and sickness symptoms discussed. Wears mask, social distancing, hand hygiene as appropriate.   Activities of Daily Living Patient denies needing assistance with: household chores, feeding themselves, getting from bed to chair, getting to the toilet, bathing/showering, dressing, managing money, or preparing meals.   Memory: Patient is alert. Patient denies difficulty focusing or concentrating. Correctly identified the president of the [place], season and  recall.  BMI- discussed the importance of a healthy diet, water intake and the benefits of aerobic exercise.  Educational material provided.  Physical activity- no routine  Diet: diabetic diet  Water: good intake Caffeine:   Other Providers Patient Care Team: Leone Haven, MD as PCP - General (Family Medicine) End, Harrell Gave, MD as PCP - Cardiology (Cardiology) Christene Lye, MD as Consulting Physician (General Surgery) Gabriel Carina Betsey Holiday, MD as Physician Assistant (Endocrinology)  Exercise Activities and Dietary recommendations Current Exercise Habits: The patient does not participate in regular exercise at present  Goals      Patient Stated    Increase physical activity (pt-stated)     I will use my exercise machine on a regular basis. I also plan to decrease coke intake.       Fall Risk Fall Risk  04/17/2019 03/02/2019 04/11/2018 09/07/2017 04/08/2017  Falls in the past year? 0 0 No No Yes  Number falls in past yr: - 0 - - 1  Injury with Fall? - 0 - - No  Follow up - Falls evaluation completed - - -   Timed Get Up and Go performed: no, virtual visit  Depression Screen PHQ 2/9 Scores 04/17/2019 03/02/2019 04/11/2018 09/07/2017  PHQ - 2 Score 0 0 0 0  PHQ- 9 Score - - - -     Cognitive Function MMSE - Mini Mental State Exam 04/11/2018 04/08/2017 01/30/2016  Orientation to time _0 Orientation to time comments Asked to draw a clock face of time 11:10. Time shown 11:50. difficulty reading the face of a clock correctly -  Orientation to Place _1 Registration _2 Attention/ Calculation _3 Attention/Calculation-comments Asked to subtract by 3's Difficulty performing simple calculations -  Recall _4 Language- name 2 objects _5 Language- repeat _6 Language- follow 3 step command _7 Language- read & follow direction _8 Write a sentence _9 Copy design _10 Total score _11 6CIT Screen 04/17/2019  What Year? 0 points   What month? 0 points  What time? 0 points  Count back from 20 0 points  Months in reverse 0 points  Repeat phrase 0 points  Total Score 0    Immunization History  Administered Date(s) Administered   Influenza Whole 03/26/2012, 04/10/2013   Influenza, High Dose Seasonal PF 06/03/2016, 03/24/2017, 04/11/2018   Influenza,inj,Quad PF,6+ Mos 05/15/2014, 03/25/2015   Influenza-Unspecified 05/15/2014, 03/25/2015   Pneumococcal Conjugate-13 02/05/2019   Screening Tests Health Maintenance  Topic Date Due   INFLUENZA VACCINE  10/03/2019 (Originally 02/03/2019)   TETANUS/TDAP  12/28/2020 (Originally 03/13/1960)   OPHTHALMOLOGY EXAM  05/26/2019   HEMOGLOBIN A1C  09/30/2019   FOOT EXAM  04/01/2020   DEXA SCAN  Addressed   PNA vac Low Risk Adult  Discontinued      Plan:   Keep all routine maintenance appointments.   Follow up with your doctor today at 11:00  Medicare Attestation I have personally reviewed: The patient's medical and social history Their use of alcohol, tobacco or illicit drugs Their current medications and supplements The patient's functional ability including ADLs,fall risks, home safety risks, cognitive, and hearing and visual impairment Diet and physical activities Evidence for depression   In addition, I have reviewed and discussed with patient certain preventive protocols, quality metrics, and best practice recommendations. A written personalized care plan for preventive services as well as general preventive health recommendations were provided to patient via mail.     Varney Biles, LPN  [id number]

## 2019-04-17 NOTE — Assessment & Plan Note (Signed)
Reasonably controlled given her age.  She will continue her current regimen.

## 2019-04-17 NOTE — Patient Instructions (Signed)
  Kelsey Mason , Thank you for taking time to come for your Medicare Wellness Visit. I appreciate your ongoing commitment to your health goals. Please review the following plan we discussed and let me know if I can assist you in the future.   These are the goals we discussed: Goals      Patient Stated   . Increase physical activity (pt-stated)     I will use my exercise machine on a regular basis. I also plan to decrease coke intake.       This is a list of the screening recommended for you and due dates:  Health Maintenance  Topic Date Due  . Flu Shot  10/03/2019*  . Tetanus Vaccine  12/28/2020*  . Eye exam for diabetics  05/26/2019  . Hemoglobin A1C  09/30/2019  . Complete foot exam   04/01/2020  . DEXA scan (bone density measurement)  Addressed  . Pneumonia vaccines  Discontinued  *Topic was postponed. The date shown is not the original due date.

## 2019-04-18 ENCOUNTER — Other Ambulatory Visit: Payer: Self-pay | Admitting: Oncology

## 2019-04-18 DIAGNOSIS — C50411 Malignant neoplasm of upper-outer quadrant of right female breast: Secondary | ICD-10-CM

## 2019-04-19 ENCOUNTER — Ambulatory Visit
Admission: RE | Admit: 2019-04-19 | Discharge: 2019-04-19 | Disposition: A | Discharge status: Home / Self Care | Payer: Medicare Other | Source: Ambulatory Visit | Attending: Family Medicine | Admitting: Family Medicine

## 2019-04-19 DIAGNOSIS — M533 Sacrococcygeal disorders, not elsewhere classified: Secondary | ICD-10-CM | POA: Insufficient documentation

## 2019-04-19 DIAGNOSIS — M545 Low back pain: Secondary | ICD-10-CM | POA: Diagnosis not present

## 2019-04-19 DIAGNOSIS — G8929 Other chronic pain: Secondary | ICD-10-CM | POA: Diagnosis present

## 2019-04-24 ENCOUNTER — Telehealth: Payer: Self-pay | Admitting: *Deleted

## 2019-04-24 DIAGNOSIS — G8929 Other chronic pain: Secondary | ICD-10-CM

## 2019-04-24 DIAGNOSIS — M545 Low back pain, unspecified: Secondary | ICD-10-CM

## 2019-04-24 NOTE — Telephone Encounter (Signed)
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 Pt said No also to having a MRI done if her insurance [place] [facility] will not cover it ,because she is on a fixed income and does not want to get stuck with a bill.

## 2019-04-24 NOTE — Telephone Encounter (Signed)
Copied from [place] [id number]. 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 I can then place the order for the MRI. Thanks. >> Apr 24, 2019  1:56 PM Keene Breath wrote: Patient is returning a call to [place].  Please call patient to discuss at [phone]

## 2019-04-28 NOTE — Telephone Encounter (Signed)
Order has been placed. Noted that they should get this approved first before scheduling. The patient needs to check on the price when they call to schedule this. Thanks.

## 2019-04-30 NOTE — Telephone Encounter (Signed)
Called Pt and told her that a MRI order was placed, and I told her that she can check the price also when they call her. Pt stated okay she understands

## 2019-05-11 ENCOUNTER — Ambulatory Visit
Admission: RE | Admit: 2019-05-11 | Discharge: 2019-05-11 | Disposition: A | Discharge status: Home / Self Care | Payer: Medicare Other | Source: Ambulatory Visit | Attending: Family Medicine | Admitting: Family Medicine

## 2019-05-11 DIAGNOSIS — Z1231 Encounter for screening mammogram for malignant neoplasm of breast: Secondary | ICD-10-CM

## 2019-05-17 ENCOUNTER — Ambulatory Visit
Admission: RE | Admit: 2019-05-17 | Discharge: 2019-05-17 | Disposition: A | Discharge status: Home / Self Care | Payer: Medicare Other | Source: Ambulatory Visit | Attending: Family Medicine | Admitting: Family Medicine

## 2019-05-17 ENCOUNTER — Other Ambulatory Visit: Payer: Self-pay

## 2019-05-17 DIAGNOSIS — G8929 Other chronic pain: Secondary | ICD-10-CM | POA: Diagnosis present

## 2019-05-17 DIAGNOSIS — M545 Low back pain: Secondary | ICD-10-CM | POA: Insufficient documentation

## 2019-05-18 ENCOUNTER — Telehealth: Payer: Self-pay | Admitting: Internal Medicine

## 2019-05-18 NOTE — Telephone Encounter (Signed)
I spoke with [name], who recently saw [name] for evaluation of essential tremor.  I think it is reasonable to transition from carvedilol to propranolol for treatment of tremor.  If [name] were to have worsening HF sx and decline in LVEF, we would need to consider transitioning back to carvedilol, metoprolol succinate, or bisoprolol.  I will defer dosing to [name]; pt has been on carvedilol 12.5 mg BID.  Nelva Bush, MD [facility] Pager: [phone]

## 2019-05-21 NOTE — Telephone Encounter (Signed)
According to [facility], PCP has been in contact with patient regarding med changes.

## 2019-05-22 ENCOUNTER — Telehealth: Payer: Self-pay | Admitting: *Deleted

## 2019-05-22 DIAGNOSIS — G8929 Other chronic pain: Secondary | ICD-10-CM

## 2019-05-22 DIAGNOSIS — M48061 Spinal stenosis, lumbar region without neurogenic claudication: Secondary | ICD-10-CM

## 2019-05-22 NOTE — Telephone Encounter (Signed)
Not covering Dr. Chauncey Cruel today   [place]

## 2019-05-22 NOTE — Telephone Encounter (Signed)
Sent to [facility]

## 2019-05-22 NOTE — Telephone Encounter (Signed)
This patient is needing a referral to a spine specialist per Dr. Caryl Bis can you please put in this referral.  Nina,cma

## 2019-05-22 NOTE — Telephone Encounter (Signed)
Copied from [place] [phone]. Topic: General - Inquiry >> May 22, 2019 10:41 AM Mathis Bud wrote: Reason for CRM: Patient states she got a voicemail from office regarding a referral for her spine.  No referral is made. Please call patient back with further info. Call back [phone]

## 2019-05-22 NOTE — Telephone Encounter (Signed)
I have made the referral on behalf odf Dr Caryl Bis based on MrI results

## 2019-05-24 LAB — HM DIABETES EYE EXAM

## 2019-06-07 ENCOUNTER — Other Ambulatory Visit: Payer: Self-pay

## 2019-06-07 DIAGNOSIS — Z20822 Contact with and (suspected) exposure to covid-19: Secondary | ICD-10-CM

## 2019-06-10 LAB — NOVEL CORONAVIRUS, NAA: SARS-CoV-2, NAA: NOT DETECTED

## 2019-06-19 ENCOUNTER — Inpatient Hospital Stay: Payer: Medicare Other | Attending: Oncology

## 2019-06-19 ENCOUNTER — Other Ambulatory Visit: Payer: Self-pay

## 2019-06-19 ENCOUNTER — Encounter: Payer: Self-pay | Admitting: Oncology

## 2019-06-19 ENCOUNTER — Inpatient Hospital Stay: Payer: Medicare Other | Admitting: Oncology

## 2019-06-19 VITALS — BP 160/81 | HR 72 | Temp 98.4°F | Resp 18 | Wt 159.0 lb

## 2019-06-19 DIAGNOSIS — E538 Deficiency of other specified B group vitamins: Secondary | ICD-10-CM | POA: Diagnosis not present

## 2019-06-19 DIAGNOSIS — C50912 Malignant neoplasm of unspecified site of left female breast: Secondary | ICD-10-CM | POA: Diagnosis present

## 2019-06-19 DIAGNOSIS — R110 Nausea: Secondary | ICD-10-CM | POA: Diagnosis not present

## 2019-06-19 DIAGNOSIS — Z923 Personal history of irradiation: Secondary | ICD-10-CM | POA: Diagnosis not present

## 2019-06-19 DIAGNOSIS — Z170 Estrogen receptor positive status [ER+]: Secondary | ICD-10-CM | POA: Insufficient documentation

## 2019-06-19 DIAGNOSIS — Z853 Personal history of malignant neoplasm of breast: Secondary | ICD-10-CM | POA: Diagnosis not present

## 2019-06-19 DIAGNOSIS — M8580 Other specified disorders of bone density and structure, unspecified site: Secondary | ICD-10-CM

## 2019-06-19 DIAGNOSIS — Z79811 Long term (current) use of aromatase inhibitors: Secondary | ICD-10-CM

## 2019-06-19 DIAGNOSIS — D649 Anemia, unspecified: Secondary | ICD-10-CM

## 2019-06-19 DIAGNOSIS — Z87891 Personal history of nicotine dependence: Secondary | ICD-10-CM | POA: Diagnosis not present

## 2019-06-19 DIAGNOSIS — E1140 Type 2 diabetes mellitus with diabetic neuropathy, unspecified: Secondary | ICD-10-CM | POA: Insufficient documentation

## 2019-06-19 DIAGNOSIS — C50911 Malignant neoplasm of unspecified site of right female breast: Secondary | ICD-10-CM | POA: Insufficient documentation

## 2019-06-19 DIAGNOSIS — I10 Essential (primary) hypertension: Secondary | ICD-10-CM | POA: Insufficient documentation

## 2019-06-19 DIAGNOSIS — Z9221 Personal history of antineoplastic chemotherapy: Secondary | ICD-10-CM | POA: Insufficient documentation

## 2019-06-19 DIAGNOSIS — K219 Gastro-esophageal reflux disease without esophagitis: Secondary | ICD-10-CM | POA: Insufficient documentation

## 2019-06-19 DIAGNOSIS — Z7982 Long term (current) use of aspirin: Secondary | ICD-10-CM | POA: Diagnosis not present

## 2019-06-19 DIAGNOSIS — Z79899 Other long term (current) drug therapy: Secondary | ICD-10-CM | POA: Insufficient documentation

## 2019-06-19 DIAGNOSIS — Z794 Long term (current) use of insulin: Secondary | ICD-10-CM | POA: Insufficient documentation

## 2019-06-19 LAB — COMPREHENSIVE METABOLIC PANEL
ALT: 20 U/L (ref 0–44)
AST: 18 U/L (ref 15–41)
Albumin: 4.3 g/dL (ref 3.5–5.0)
Alkaline Phosphatase: 75 U/L (ref 38–126)
Anion gap: 7 (ref 5–15)
BUN: 15 mg/dL (ref 8–23)
CO2: 29 mmol/L (ref 22–32)
Calcium: 9.5 mg/dL (ref 8.9–10.3)
Chloride: 99 mmol/L (ref 98–111)
Creatinine, Ser: 1.21 mg/dL — ABNORMAL HIGH (ref 0.44–1.00)
GFR calc Af Amer: 50 mL/min — ABNORMAL LOW (ref >60)
GFR calc non Af Amer: 43 mL/min — ABNORMAL LOW (ref >60)
Glucose, Bld: 129 mg/dL — ABNORMAL HIGH (ref 70–99)
Potassium: 3.8 mmol/L (ref 3.5–5.1)
Sodium: 135 mmol/L (ref 135–145)
Total Bilirubin: 0.7 mg/dL (ref 0.3–1.2)
Total Protein: 6.7 g/dL (ref 6.5–8.1)

## 2019-06-19 LAB — CBC WITH DIFFERENTIAL/PLATELET
Abs Immature Granulocytes: 0.06 10*3/uL (ref 0.00–0.07)
Basophils Absolute: 0.1 10*3/uL (ref 0.0–0.1)
Basophils Relative: 1 %
Eosinophils Absolute: 0.7 10*3/uL — ABNORMAL HIGH (ref 0.0–0.5)
Eosinophils Relative: 7 %
HCT: 35.7 % — ABNORMAL LOW (ref 36.0–46.0)
Hemoglobin: 11.9 g/dL — ABNORMAL LOW (ref 12.0–15.0)
Immature Granulocytes: 1 %
Lymphocytes Relative: 17 %
Lymphs Abs: 1.7 10*3/uL (ref 0.7–4.0)
MCH: 29.6 pg (ref 26.0–34.0)
MCHC: 33.3 g/dL (ref 30.0–36.0)
MCV: 88.8 fL (ref 80.0–100.0)
Monocytes Absolute: 0.6 10*3/uL (ref 0.1–1.0)
Monocytes Relative: 6 %
Neutro Abs: 6.7 10*3/uL (ref 1.7–7.7)
Neutrophils Relative %: 68 %
Platelets: 211 10*3/uL (ref 150–400)
RBC: 4.02 MIL/uL (ref 3.87–5.11)
RDW: 12.4 % (ref 11.5–15.5)
WBC: 9.7 10*3/uL (ref 4.0–10.5)
nRBC: 0 % (ref 0.0–0.2)

## 2019-06-19 LAB — RETIC PANEL
Immature Retic Fract: 6.5 % (ref 2.3–15.9)
RBC.: 4.02 MIL/uL (ref 3.87–5.11)
Retic Count, Absolute: 69.1 10*3/uL (ref 19.0–186.0)
Retic Ct Pct: 1.7 % (ref 0.4–3.1)
Reticulocyte Hemoglobin: 32.7 pg (ref >27.9)

## 2019-06-19 LAB — FOLATE: Folate: 8.3 ng/mL (ref >5.9)

## 2019-06-19 LAB — VITAMIN B12: Vitamin B-12: 2085 pg/mL — ABNORMAL HIGH (ref 180–914)

## 2019-06-19 NOTE — Progress Notes (Signed)
Patient here for follow up. States she get nauseated at times. Denies any new breast issues.

## 2019-06-19 NOTE — Progress Notes (Signed)
[facility] day:  06/19/2019  Chief Complaint: Kelsey Mason is a 78 y.o. female with bilateral stage IA breast cancer who is seen for 6 month assessment on Femara.  PERTINENT ONCOLOGY HISTORY Kelsey Mason is a 78 y.o.afemale who has above oncology history reviewed by me today presented for follow up visit for management of history of bilateral breast cancer. Patient previously follows up with Dr. Mike Gip. Switched care to me on 09/18/2018. Medical record review was performed by me.  bilateral breast cancer s/p lumpectomy and sentinel lymph node biopsy on 11/17/2012.  Right breast revealed a 5 mm grade I invasive carcinoma.  There was no DCIS.  Two sentinel lymph nodes were negative.  Pathologic stage was T1aN0M0.  Tumor was ER + (90%), PR + (20%), and Her2/neu -.   Left breast revealed a 1.8 cm grade III invasive ductal carcinoma.  There was lymphovascular invasion.  There was no DCIS.  One sentinel lymph node was negative.  Pathologic stage was T1cN0M0.  Tumor was ER + (>90%), PR + (1-5%), and Her2/neu -.  Oncotype DX testing on the left breast mass revealed a recurrence score of 28 which corresponded to a 10 year risk of distant recurrence of 19% (CI 15-23%) with tamoxifen alone.  BCI testing on 10/04/2017 revealed a high risk of late recurrence (5.4%; CI 2.5%-8.2%) during years 5-10.  There was a low likelihood of benefit from extended endocrine therapy.  She received Adriamycin and Cytoxan (AC) x 4 cycles followed by weekly Taxol x 10 (completed 06/05/2013).  She did not receive 2 cycles of Taxol secondary to progressive neuropathy.  She received bilateral breast radiation (07/2013 - 09/2013).  She began letrozole (Femara) in 09/2013.   Bilateral diagnostic mammogram on 03/22/2016 revealed  no evidence of recurrent disease.  Bilateral diagnostic mammogram on 03/31/2017 revealed no evidence of malignancy.  CA27.29 has been  followed:  24.4 on 09/10/2016, 28.1 on 03/14/2017, 24.9 on 09/12/2017, and 22.8 on 03/13/2018.  Bone density on 03/21/2015 was normal with a T score of -0.5 in L1-L4 and -0.8 in the left femur.  She has a history of a borderline mild normocytic anemia.  Diet appears modest.  She denies any melena, hematochezia, hematuria or vaginal bleeding. Colonoscopy on 10/13/2015 was negative.    Soft tissue neck ultrasound on 03/16/2017 revealed no fluid collection or soft tissue lesion.   INTERVAL HISTORY Kelsey Mason is a 78 y.o. female who has above history reviewed by me today presents for follow up visit for management of bilateral breast cancer. Problems and complaints are listed below: Patient reports feeling well today.  Reports intermittent nausea at times.  Denies any nausea today. She denies any concern of her breast. Patient takes letrozole daily.  She reports tolerating well with manageable vasomotor symptoms. Patient has had her annual mammogram done in November 2020. [name]  Review of Systems  Constitutional: Negative for appetite change, chills, fatigue and fever.  HENT:   Negative for hearing loss and voice change.   Eyes: Negative for eye problems.  Respiratory: Negative for chest tightness and cough.   Cardiovascular: Negative for chest pain.  Gastrointestinal: Positive for nausea. Negative for abdominal distention, abdominal pain and blood in stool.  Endocrine: Positive for hot flashes.  Genitourinary: Negative for difficulty urinating and frequency.   Musculoskeletal: Negative for arthralgias.  Skin: Negative for itching and rash.  Neurological: Negative for extremity weakness.  Hematological: Negative for adenopathy.  Psychiatric/Behavioral: Negative  for confusion.     Past Medical History:  Diagnosis Date  . Breast symptom 2014  . Cancer [facility]) 2014   Bilateral Breast, chemo Dr. Jeb Levering  . Diabetes mellitus   . GERD (gastroesophageal reflux disease)    mild  .  Hypertension   . Personal history of chemotherapy   . Personal history of radiation therapy 2014   bilat lumpectomy    Past Surgical History:  Procedure Laterality Date  . ABDOMINAL AORTOGRAM W/LOWER EXTREMITY N/A 04/21/2018   Procedure: ABDOMINAL AORTOGRAM W/LOWER EXTREMITY;  Surgeon: Nelva Bush, MD;  Location: [facility];  Service: Cardiovascular;  Laterality: N/A;  . ABDOMINAL HYSTERECTOMY  1976   for pelvic pain  . BREAST EXCISIONAL BIOPSY Bilateral 11/2012   bilat breast ca chemo and rad  . BREAST SURGERY Bilateral 2014   bilateral lumpectomy and SN biopsy  . CHOLECYSTECTOMY  2007  . COLONOSCOPY WITH PROPOFOL N/A 10/13/2015   Procedure: COLONOSCOPY WITH PROPOFOL;  Surgeon: Manya Silvas, MD;  Location: [facility] ENDOSCOPY;  Service: Endoscopy;  Laterality: N/A;  . LEFT HEART CATH AND CORONARY ANGIOGRAPHY N/A 04/21/2018   Procedure: LEFT HEART CATH AND CORONARY ANGIOGRAPHY;  Surgeon: Nelva Bush, MD;  Location: [facility];  Service: Cardiovascular;  Laterality: N/A;  . OOPHORECTOMY Right   . PORTACATH PLACEMENT  2014  . TUMOR REMOVAL Right 1980   right foot    Family History  Problem Relation Age of Onset  . Alcohol abuse Mother   . Heart disease Mother   . Hypertension Mother   . Breast cancer Mother 34  . Alcohol abuse Father   . Heart disease Father   . Hypertension Father   . Heart disease Brother   . Heart attack Brother    Social History   Socioeconomic History  . Marital status: Widowed    Spouse name: Not on file  . Number of children: Not on file  . Years of education: Not on file  . Highest education level: Not on file  Occupational History  . Not on file  Tobacco Use  . Smoking status: Former Smoker    Packs/day: 0.25    Years: 1.00    Pack years: 0.25    Types: Cigarettes    Quit date: 1963    Years since quitting: 57.9  . Smokeless tobacco: Never Used  Substance and Sexual Activity  . Alcohol use: No    Alcohol/week:  0.0 standard drinks  . Drug use: No  . Sexual activity: Never  Other Topics Concern  . Not on file  Social History Narrative   Lives in [place].       Works - [employer]   Diet - regular   Exercise - dance 3x per week   Social Determinants of Radio broadcast assistant Strain:   . Difficulty of Paying Living Expenses: Not on file  Food Insecurity: No Food Insecurity  . Worried About Charity fundraiser in the Last Year: Never true  . Ran Out of Food in the Last Year: Never true  Transportation Needs:   . Lack of Transportation (Medical): Not on file  . Lack of Transportation (Non-Medical): Not on file  Physical Activity:   . Days of Exercise per Week: Not on file  . Minutes of Exercise per Session: Not on file  Stress:   . Feeling of Stress : Not on file  Social Connections:   . Frequency of Communication with Friends and Family:  Not on file  . Frequency of Social Gatherings with Friends and Family: Not on file  . Attends Religious Services: Not on file  . Active Member of Clubs or Organizations: Not on file  . Attends Archivist Meetings: Not on file  . Marital Status: Not on file  Intimate Partner Violence:   . Fear of Current or Ex-Partner: Not on file  . Emotionally Abused: Not on file  . Physically Abused: Not on file  . Sexually Abused: Not on file     She lives by herself in [place].  Husband passed away many years ago.  The patient is alone today.  Allergies:  Allergies  Allergen Reactions  . Advil [Ibuprofen] Other (See Comments)    Dizziness   . Aleve [Naproxen] Other (See Comments)    Dizziness  . Fluticasone-Salmeterol Other (See Comments)    dizziness  . Lipitor [Atorvastatin] Other (See Comments)    Legs weak.  Leg pain, muscle ache  . Naproxen Sodium Other (See Comments)  . Penicillins Hives and Other (See Comments)    Has patient had a PCN reaction causing immediate rash, facial/tongue/throat swelling, SOB or  lightheadedness with hypotension: YES Has patient had a PCN reaction causing severe rash involving mucus membranes or skin necrosis: no Has patient had a PCN reaction that required hospitalization: no Has patient had a PCN reaction occurring within the last 10 years: no If all of the above answers are "NO", then may proceed with Cephalosporin use.     Current Medications: Current Outpatient Medications  Medication Sig Dispense Refill  . aspirin 81 MG tablet Take 81 mg by mouth daily.    . blood glucose meter kit and supplies KIT Dispense based on patient and insurance preference. Use up to four times daily as directed. One Touch Ultra mini meter (FOR ICD-9 250.00, 250.01). Dx: E11.9 1 each 0  . cholecalciferol (VITAMIN D) 25 MCG (1000 UT) tablet Take 1,000 Units by mouth daily.    . furosemide (LASIX) 20 MG tablet Take 1 tablet (20 mg total) by mouth daily. 90 tablet 3  . glimepiride (AMARYL) 2 MG tablet TAKE 1 TABLET (2 MG TOTAL) BY MOUTH 2 (TWO) TIMES DAILY. (Patient taking differently: Take 2 mg by mouth daily with breakfast. TAKE 1 TABLET (2 MG TOTAL) BY MOUTH 2 (TWO) TIMES DAILY.) 180 tablet 1  . glucose blood (ONE TOUCH ULTRA TEST) test strip USE TO TEST BLOOD SUGAR UP TO 4 TIMES DAILY 100 each 6  . LANTUS SOLOSTAR 100 UNIT/ML Solostar Pen INJECT 20 UNITS INTO THE SKIN DAILY AT 10 PM. (Patient taking differently: Inject 28 Units into the skin at bedtime. ) 18 pen 0  . letrozole (FEMARA) 2.5 MG tablet TAKE 1 TABLET BY MOUTH EVERY DAY 90 tablet 0  . loratadine-pseudoephedrine (CLARITIN-D 24-HOUR) 10-240 MG 24 hr tablet Take 1 tablet by mouth daily.    [name] losartan (COZAAR) 100 MG tablet Take 0.5 tablets (50 mg total) by mouth 2 (two) times daily. Take 1 tablet (50 mg) by mouth twice daily 90 tablet 1  . nitroGLYCERIN (NITROSTAT) 0.4 MG SL tablet Place 1 tablet (0.4 mg total) under the tongue every 5 (five) minutes as needed for chest pain. Maximum of 3 doses. 25 tablet 3  . NOVOFINE 32G X 6  MM MISC USE AS DIRECTED WITH LANTUS PEN 100 each 2  . propranolol ER (INDERAL LA) 60 MG 24 hr capsule Take 60 mg by mouth daily.    . rosuvastatin (  CRESTOR) 5 MG tablet TAKE 1 TABLET BY MOUTH EVERY DAY 90 tablet 3  . TRULICITY 1.5 GG/8.3MO SOPN Inject 1.5 mg as directed once a week.  11  . vitamin B-12 (CYANOCOBALAMIN) 1000 MCG tablet Take 1,000 mcg by mouth daily.     No current facility-administered medications for this visit.   Physical Exam: Blood pressure (!) 160/81, pulse 72, temperature 98.4 F (36.9 C), resp. rate 18, weight 159 lb (72.1 kg). Physical Exam  Constitutional: She is oriented to person, place, and time. No distress.  HENT:  Head: Normocephalic and atraumatic.  Nose: Nose normal.  Mouth/Throat: Oropharynx is clear and moist. No oropharyngeal exudate.  Eyes: Pupils are equal, round, and reactive to light. EOM are normal. No scleral icterus.  Cardiovascular: Normal rate and regular rhythm.  No murmur heard. Pulmonary/Chest: Effort normal. No respiratory distress. She has no rales. She exhibits no tenderness.  Abdominal: Soft. She exhibits no distension. There is no abdominal tenderness.  Musculoskeletal:        General: No edema. Normal range of motion.     Cervical back: Normal range of motion and neck supple.  Neurological: She is alert and oriented to person, place, and time. No cranial nerve deficit. She exhibits normal muscle tone. Coordination normal.  Skin: Skin is warm and dry. She is not diaphoretic. No erythema.  Psychiatric: Affect normal.  Breast exam was performed in seated and lying down position. Patient is status post bilateral lumpectomy with well-healed surgical scar.  Bilateral chronic nipple retraction  no evidence of any palpable masses , except chronic fibrocystic area around right nipple.   No evidence of axillary adenopathy bilaterally.     Appointment on 06/19/2019  Component Date Value Ref Range Status  . Folate 06/19/2019 8.3  >5.9  ng/mL Final   Performed at [facility], [address] [place], [place] [address]  . Retic Ct Pct 06/19/2019 1.7  0.4 - 3.1 % Final  . RBC. 06/19/2019 4.02  3.87 - 5.11 MIL/uL Final  . Retic Count, Absolute 06/19/2019 69.1  19.0 - 186.0 K/uL Final  . Immature Retic Fract 06/19/2019 6.5  2.3 - 15.9 % Final  . Reticulocyte Hemoglobin 06/19/2019 32.7  >27.9 pg Final   Comment:        Given the high negative predictive value of a RET-He result > 32 pg iron deficiency is essentially excluded. If this patient is anemic other etiologies should be considered. Performed at [facility], [address], [place], [place] [address]   . Sodium 06/19/2019 135  135 - 145 mmol/L Final  . Potassium 06/19/2019 3.8  3.5 - 5.1 mmol/L Final  . Chloride 06/19/2019 99  98 - 111 mmol/L Final  . CO2 06/19/2019 29  22 - 32 mmol/L Final  . Glucose, Bld 06/19/2019 129* 70 - 99 mg/dL Final  . BUN 06/19/2019 15  8 - 23 mg/dL Final  . Creatinine, Ser 06/19/2019 1.21* 0.44 - 1.00 mg/dL Final  . Calcium 06/19/2019 9.5  8.9 - 10.3 mg/dL Final  . Total Protein 06/19/2019 6.7  6.5 - 8.1 g/dL Final  . Albumin 06/19/2019 4.3  3.5 - 5.0 g/dL Final  . AST 06/19/2019 18  15 - 41 U/L Final  . ALT 06/19/2019 20  0 - 44 U/L Final  . Alkaline Phosphatase 06/19/2019 75  38 - 126 U/L Final  . Total Bilirubin 06/19/2019 0.7  0.3 - 1.2 mg/dL Final  . GFR calc non Af Amer 06/19/2019 43* >60  mL/min Final  . GFR calc Af Amer 06/19/2019 50* >60 mL/min Final  . Anion gap 06/19/2019 7  5 - 15 Final   Performed at [facility], [address], [place], [place] [address]  . WBC 06/19/2019 9.7  4.0 - 10.5 K/uL Final  . RBC 06/19/2019 4.02  3.87 - 5.11 MIL/uL Final  . Hemoglobin 06/19/2019 11.9* 12.0 - 15.0 g/dL Final  . HCT 06/19/2019 35.7* 36.0 - 46.0 % Final  . MCV 06/19/2019 88.8  80.0 - 100.0 fL Final  . MCH 06/19/2019 29.6  26.0 - 34.0 pg Final  . MCHC 06/19/2019 33.3  30.0 - 36.0 g/dL Final  . RDW  06/19/2019 12.4  11.5 - 15.5 % Final  . Platelets 06/19/2019 211  150 - 400 K/uL Final  . nRBC 06/19/2019 0.0  0.0 - 0.2 % Final  . Neutrophils Relative % 06/19/2019 68  % Final  . Neutro Abs 06/19/2019 6.7  1.7 - 7.7 K/uL Final  . Lymphocytes Relative 06/19/2019 17  % Final  . Lymphs Abs 06/19/2019 1.7  0.7 - 4.0 K/uL Final  . Monocytes Relative 06/19/2019 6  % Final  . Monocytes Absolute 06/19/2019 0.6  0.1 - 1.0 K/uL Final  . Eosinophils Relative 06/19/2019 7  % Final  . Eosinophils Absolute 06/19/2019 0.7* 0.0 - 0.5 K/uL Final  . Basophils Relative 06/19/2019 1  % Final  . Basophils Absolute 06/19/2019 0.1  0.0 - 0.1 K/uL Final  . Immature Granulocytes 06/19/2019 1  % Final  . Abs Immature Granulocytes 06/19/2019 0.06  0.00 - 0.07 K/uL Final   Performed at [facility], [address] [place], [place] [address]   RADIOGRAPHIC STUDIES: I have personally reviewed the radiological images as listed and agreed with the findings in the report. DG Chest 2 View  Result Date: 04/10/2019 CLINICAL DATA:  Suspected right sided pneumonia. EXAM: CHEST - 2 VIEW COMPARISON:  03/08/2019 FINDINGS: Lungs are adequately inflated without consolidation or effusion. Resolution of previously seen airspace process over the right midlung. Cardiomediastinal silhouette and remainder of the exam is unchanged. IMPRESSION: No acute cardiopulmonary disease. Resolution of previously seen right midlung airspace process. Electronically Signed   By: Marin Olp M.D.   On: 04/10/2019 15:36   DG Lumbar Spine Complete  Result Date: 04/19/2019 CLINICAL DATA:  Low back pain for 1 year EXAM: LUMBAR SPINE - COMPLETE 4+ VIEW COMPARISON:  None. FINDINGS: Five lumbar type vertebral bodies are well visualized. The fifth lumbar vertebra is partially sacralized. Facet hypertrophic changes are noted. No definitive pars defects are seen. Mild osteophytic changes are noted. Multilevel vacuum disc phenomenon is seen as well. No  soft tissue abnormality is noted. Aortic calcifications are seen without aneurysmal dilatation. IMPRESSION: Mild degenerative change without acute abnormality. Electronically Signed   By: Inez Catalina M.D.   On: 04/19/2019 12:49   DG Sacrum/Coccyx  Result Date: 04/19/2019 CLINICAL DATA:  Sacral pain, no known injury, initial encounter EXAM: SACRUM AND COCCYX - 2+ VIEW COMPARISON:  None. FINDINGS: Pelvic ring is intact. Degenerative changes of the hip joints are noted. Sacral ala are within normal limits. No acute fracture is seen. No soft tissue abnormality is noted. IMPRESSION: Degenerative change without acute abnormality. Electronically Signed   By: Inez Catalina M.D.   On: 04/19/2019 12:50   MR Lumbar Spine Wo Contrast  Result Date: 05/17/2019 CLINICAL DATA:  Progressively worsening chronic low back pain. Occasional left hip pain. No recent injury. EXAM: MRI LUMBAR SPINE  WITHOUT CONTRAST TECHNIQUE: Multiplanar, multisequence MR imaging of the lumbar spine was performed. No intravenous contrast was administered. COMPARISON:  Lumbar spine x-rays dated April 19, 2019. FINDINGS: Segmentation:  Partial sacralization of L5. Alignment: Mild levocurvature. Trace retrolisthesis at L3-L4. Trace anterolisthesis at T11-T12. Vertebrae: No fracture, evidence of discitis, or bone lesion. Small L1 and L4 hemangiomas. Conus medullaris and cauda equina: Conus extends to the L1 level. Conus and cauda equina appear normal. Paraspinal and other soft tissues: Negative. Disc levels: T12-L1: Small left paracentral/subarticular disc protrusion. No stenosis. L1-L2: Mild disc degeneration without significant disc bulge or herniation. No stenosis. L2-L3: Moderate asymmetric right-sided disc bulging. Mild bilateral facet arthropathy. Moderate spinal canal and right lateral recess stenosis. Mild left lateral recess stenosis. No neuroforaminal stenosis. L3-L4: Moderate disc bulging with superimposed central disc protrusion. Mild  bilateral facet arthropathy. Severe spinal canal and lateral recess stenosis. Mild left neuroforaminal stenosis. No right neuroforaminal stenosis. L4-L5: Mild asymmetric left-sided disc bulging. Mild bilateral facet arthropathy with asymmetric left-sided ligamentum flavum hypertrophy. Mild spinal canal stenosis. Moderate left and mild right lateral recess stenosis. Mild left neuroforaminal stenosis. No right neuroforaminal stenosis. L5-S1: Central and left-sided broad-based posterior disc protrusion. Mild left facet arthropathy. Mild left lateral recess and neuroforaminal stenosis. No spinal canal or right neuroforaminal stenosis. IMPRESSION: 1. Multilevel lumbar spondylosis as described above. Moderate L2-L3 and severe L3-L4 spinal canal stenosis. Electronically Signed   By: Titus Dubin M.D.   On: 05/17/2019 14:36   MM 3D SCREEN BREAST BILATERAL  Result Date: 05/11/2019 CLINICAL DATA:  Screening. EXAM: DIGITAL SCREENING BILATERAL MAMMOGRAM WITH TOMO AND CAD COMPARISON:  Previous exam(s). ACR Breast Density Category b: There are scattered areas of fibroglandular density. FINDINGS: There are no findings suspicious for malignancy. Images were processed with CAD. IMPRESSION: No mammographic evidence of malignancy. A result letter of this screening mammogram will be mailed directly to the patient. RECOMMENDATION: Screening mammogram in one year. (Code:SM-B-01Y) BI-RADS CATEGORY  1: Negative. Electronically Signed   By: Curlene Dolphin M.D.   On: 05/11/2019 14:56    Assessment:  Kelsey Mason is a 78 y.o. female presents for follow-up of bilateral stage I breast cancer.  1. History of breast cancer   2. Aromatase inhibitor use   3. Osteopenia, unspecified location   4. B12 deficiency   # History of breast cancer- 2014 Previously discussed and patient desires to extend letrozole.  Clinically she is doing well. Continue letrozole 2.5 mg daily. Annual diagnostic mammogram 05/11/2019 images were  independent reviewed by me and discussed with patient.  No mammographic evidence of malignancy.   # Osteopenia,  Bone density 10/18/2017 was reviewed T score -2.3, patient is considered osteopenic according to WHO criteria.  Patient is due for repeat another bone density after April 2021. Recommend patient to continue take calcium 1000 mg daily with vitamin D supplementation. Previously discussed multiple times about rationale of bisphosphonate.  Potential side effects discussed as well. Recommend patient to obtain dental clearance.  Patient decides to hold dental evaluation due to COVID-19 pandemic.  # Vitamin D deficiency, continue Vitamin D 1000 unit daily.  # History of low vitamin B12 levels, B12 level is pending. #Anemia and thrombocytopenia at the last visit have both resolved.[name]    RTC in 6 months for MD assessment and labs   Earlie Server, MD, PhD Hematology Oncology [facility] at [facility] Pager- [phone] 06/19/2019

## 2019-07-09 DIAGNOSIS — M5441 Lumbago with sciatica, right side: Secondary | ICD-10-CM | POA: Diagnosis not present

## 2019-07-09 DIAGNOSIS — M5442 Lumbago with sciatica, left side: Secondary | ICD-10-CM | POA: Diagnosis not present

## 2019-07-09 DIAGNOSIS — M2569 Stiffness of other specified joint, not elsewhere classified: Secondary | ICD-10-CM | POA: Diagnosis not present

## 2019-07-10 DIAGNOSIS — N183 Chronic kidney disease, stage 3 unspecified: Secondary | ICD-10-CM | POA: Diagnosis not present

## 2019-07-10 DIAGNOSIS — E1122 Type 2 diabetes mellitus with diabetic chronic kidney disease: Secondary | ICD-10-CM | POA: Diagnosis not present

## 2019-07-17 DIAGNOSIS — E1129 Type 2 diabetes mellitus with other diabetic kidney complication: Secondary | ICD-10-CM | POA: Diagnosis not present

## 2019-07-17 DIAGNOSIS — N1832 Chronic kidney disease, stage 3b: Secondary | ICD-10-CM | POA: Diagnosis not present

## 2019-07-17 DIAGNOSIS — E1121 Type 2 diabetes mellitus with diabetic nephropathy: Secondary | ICD-10-CM | POA: Diagnosis not present

## 2019-07-17 DIAGNOSIS — Z794 Long term (current) use of insulin: Secondary | ICD-10-CM | POA: Diagnosis not present

## 2019-07-17 DIAGNOSIS — R809 Proteinuria, unspecified: Secondary | ICD-10-CM | POA: Diagnosis not present

## 2019-07-18 ENCOUNTER — Other Ambulatory Visit: Payer: Self-pay | Admitting: Oncology

## 2019-07-18 DIAGNOSIS — M2569 Stiffness of other specified joint, not elsewhere classified: Secondary | ICD-10-CM | POA: Diagnosis not present

## 2019-07-18 DIAGNOSIS — M5441 Lumbago with sciatica, right side: Secondary | ICD-10-CM | POA: Diagnosis not present

## 2019-07-18 DIAGNOSIS — C50411 Malignant neoplasm of upper-outer quadrant of right female breast: Secondary | ICD-10-CM

## 2019-07-18 DIAGNOSIS — M5442 Lumbago with sciatica, left side: Secondary | ICD-10-CM | POA: Diagnosis not present

## 2019-07-25 DIAGNOSIS — M5441 Lumbago with sciatica, right side: Secondary | ICD-10-CM | POA: Diagnosis not present

## 2019-07-25 DIAGNOSIS — M2569 Stiffness of other specified joint, not elsewhere classified: Secondary | ICD-10-CM | POA: Diagnosis not present

## 2019-07-25 DIAGNOSIS — R29898 Other symptoms and signs involving the musculoskeletal system: Secondary | ICD-10-CM | POA: Diagnosis not present

## 2019-07-25 DIAGNOSIS — M5442 Lumbago with sciatica, left side: Secondary | ICD-10-CM | POA: Diagnosis not present

## 2019-07-31 DIAGNOSIS — M5442 Lumbago with sciatica, left side: Secondary | ICD-10-CM | POA: Diagnosis not present

## 2019-07-31 DIAGNOSIS — M5441 Lumbago with sciatica, right side: Secondary | ICD-10-CM | POA: Diagnosis not present

## 2019-08-01 ENCOUNTER — Encounter: Payer: Self-pay | Admitting: Internal Medicine

## 2019-08-01 ENCOUNTER — Ambulatory Visit: Payer: Medicare Other | Admitting: Internal Medicine

## 2019-08-01 ENCOUNTER — Other Ambulatory Visit: Payer: Self-pay

## 2019-08-01 VITALS — BP 124/68 | HR 81 | Ht 62.5 in | Wt 158.5 lb

## 2019-08-01 DIAGNOSIS — E785 Hyperlipidemia, unspecified: Secondary | ICD-10-CM

## 2019-08-01 DIAGNOSIS — I739 Peripheral vascular disease, unspecified: Secondary | ICD-10-CM

## 2019-08-01 DIAGNOSIS — I428 Other cardiomyopathies: Secondary | ICD-10-CM | POA: Diagnosis not present

## 2019-08-01 DIAGNOSIS — I2510 Atherosclerotic heart disease of native coronary artery without angina pectoris: Secondary | ICD-10-CM

## 2019-08-01 DIAGNOSIS — I10 Essential (primary) hypertension: Secondary | ICD-10-CM | POA: Diagnosis not present

## 2019-08-01 NOTE — Patient Instructions (Signed)
Medication Instructions:  Your physician recommends that you continue on your current medications as directed. Please refer to the Current Medication list given to you today.  *If you need a refill on your cardiac medications before your next appointment, please call your pharmacy*  Lab Work: Your physician recommends that you return for lab work in: TODAY - LIPID.  If you have labs (blood work) drawn today and your tests are completely normal, you will receive your results only by: [name] MyChart Message (if you have MyChart) OR . A paper copy in the mail If you have any lab test that is abnormal or we need to change your treatment, we will call you to review the results.  Testing/Procedures: 1- Echocardiogram in 6 MONTHS PRIOR TO APPT. - Your physician has requested that you have an echocardiogram. Echocardiography is a painless test that uses sound waves to create images of your heart. It provides your doctor with information about the size and shape of your heart and how well your heart's chambers and valves are working. This procedure takes approximately one hour. There are no restrictions for this procedure. You may get an IV, if needed, to receive an ultrasound enhancing agent through to better visualize your heart.   Follow-Up: At [facility], you and your health needs are our priority.  As part of our continuing mission to provide you with exceptional heart care, we have created designated Provider Care Teams.  These Care Teams include your primary Cardiologist (physician) and Advanced Practice Providers (APPs -  Physician Assistants and Nurse Practitioners) who all work together to provide you with the care you need, when you need it.  Your next appointment:   6 month(s)  The format for your next appointment:   In Person  Provider:    You may see Nelva Bush, MD or one of the following Advanced Practice Providers on your designated Care Team:    Murray Hodgkins, NP  Christell Faith, PA-C  Marrianne Mood, PA-C

## 2019-08-01 NOTE — Progress Notes (Signed)
Follow-up Outpatient Visit Date: 08/01/2019  Primary Care Provider: Leone Haven, MD [address] [place] [id number]  Chief Complaint: Follow-up CAD and PAD  HPI:  Kelsey Mason is a 79 y.o. female with history of nonobstructive coronary artery disease, PAD with small vessel disease involving the runoff vessels,nonischemic cardiomyopathy with normalization of LVEF, mitral regurgitation, hypertension, hyperlipidemia, type 2 diabetes mellitus,mild-moderate renal artery stenosis,and breast cancer, who presents for follow-up of coronary artery disease and cardiomyopathy.  We last spoke in May, at which time Kelsey Mason was doing well other than chronic back pain that began after chemotherapy for breast cancer.  Today, Kelsey Mason reports that she is feeling well.  She had pneumonia manifested by generalized weakness and diaphoresis in September.  She reports that COVID-19 test was negative x2.  She was treated with antibiotics with resolution of her symptoms.  She continues to work with physical therapy to help improve her strength and her back pain, which has been her primary limiting factor.  She denies chest pain, shortness of breath, palpitations, lightheadedness, and edema.  She has not had any leg pain, though she notes that she is not walking regularly.  She is trying to exercise at home.  Home blood pressures are typically 120-140/55-70.  --------------------------------------------------------------------------------------------------  Cardiovascular History & Procedures: Cardiovascular Problems:  Non-ischemic cardiomyopathy  Mitral regurgitation  Claudication  Risk Factors:  HTN, HLD, DM2, and age > 75  Cath/PCI:  LHC (04/21/2018): LMCA normal.  LAD with 50% mid vessel stenosis.  LCx proper without disease.  Dominant OM3 with 50-60% proximal stenosis.  RCA with 50% ostial stenosis.  Mildly elevated LVEDP (15 to 20 mmHg).  Abdominal aortogram and  runoff (04/21/2018): Aorta, inflow vessels, and outflow vessels.  There is tapering/occlusion of the distal anterior tibial and peroneal arteries on the right.  There is three-vessel runoff on the left.  CV Surgery:  None  EP Procedures and Devices:  None  Non-Invasive Evaluation(s):  TTE (03/14/18): Mildly dilated LV with LVEF 40-45%. Global hypokinesis with grade 2 diastolic dysfunction. Moderate MR and mild LA enlargement. Normal RV size and function.  ABIs (02/22/2018): Noncompressible runoff vessels bilaterally with triphasic waveforms. TBI's are normal bilaterally.  Renal artery Doppler (04/20/17): Mild to moderate (less than 60%) stenosis of the right renal artery. No significant left renal artery disease.  TTE (01/14/17): Normal LV size with LVEF 50-55%. Anteroseptal hypokinesis is noted, as well as grade 1 diastolic dysfunction. Mild to moderate MR. Normal RV size and function. Mild pulmonary hypertension.  Pharmacologic myocardial perfusion stress test (06/16/16): Low risk study with moderate in size, moderate in severity, fixed defect involving the septum most likely related to LBBB. No evidence of ischemia. LVEF 50% by automated calculation and likely higher based on visual estimation.  ABIs (06/10/16): ABIs: Right not obtainable, left 1.4. TBIs right 1.1, left not obtainable. Bilateral great toe PPG's are normal. Bilateral common femoral, popliteal, peroneal, anterior tibial, and posterior tibial artery waveforms are brisk and triphasic.  TTE (02/23/16, [facility]): Mildly dilated left ventricle with mild to moderate LV dysfunction (EF 35-45%). Grade 1 diastolic dysfunction. Moderate mitral regurgitation. Mild left atrial enlargement. Normal RV function.  TTE (01/01/14, [facility]): Moderate to severe LV dysfunction (EF 30%) with mild LVH and mild left ventricular dilation. Mitral annular calcification with moderate MR and moderate TR noted. Normal right ventricular  contraction.  MUGA (12/25/12): Normal contraction and wall motion. EF 63%.  Recent CV Pertinent Labs: Lab Results  Component Value  Date   CHOL 134 11/25/2017   HDL 45 11/25/2017   LDLCALC 70 11/25/2017   LDLDIRECT 178.7 10/02/2012   TRIG 96 11/25/2017   CHOLHDL 3.0 11/25/2017   K 3.8 06/19/2019   K 4.7 01/07/2014   BUN 15 06/19/2019   BUN 19 02/19/2019   BUN 19 (H) 01/07/2014   CREATININE 1.21 (H) 06/19/2019   CREATININE 1.24 01/07/2014    Past medical and surgical history were reviewed and updated in EPIC.  Current Meds  Medication Sig  . aspirin 81 MG tablet Take 81 mg by mouth daily.  . blood glucose meter kit and supplies KIT Dispense based on patient and insurance preference. Use up to four times daily as directed. One Touch Ultra mini meter (FOR ICD-9 250.00, 250.01). Dx: E11.9  . cholecalciferol (VITAMIN D) 25 MCG (1000 UT) tablet Take 1,000 Units by mouth daily.  . furosemide (LASIX) 20 MG tablet Take 1 tablet (20 mg total) by mouth daily.  [name] glimepiride (AMARYL) 2 MG tablet TAKE 1 TABLET (2 MG TOTAL) BY MOUTH 2 (TWO) TIMES DAILY. (Patient taking differently: Take 2 mg by mouth daily with breakfast. TAKE 1 TABLET (2 MG TOTAL) BY MOUTH 2 (TWO) TIMES DAILY.)  . glucose blood (ONE TOUCH ULTRA TEST) test strip USE TO TEST BLOOD SUGAR UP TO 4 TIMES DAILY  . LANTUS SOLOSTAR 100 UNIT/ML Solostar Pen INJECT 20 UNITS INTO THE SKIN DAILY AT 10 PM. (Patient taking differently: Inject 28 Units into the skin at bedtime. )  . letrozole (FEMARA) 2.5 MG tablet TAKE 1 TABLET BY MOUTH EVERY DAY  . loratadine-pseudoephedrine (CLARITIN-D 24-HOUR) 10-240 MG 24 hr tablet Take 1 tablet by mouth daily.  [name] losartan (COZAAR) 100 MG tablet Take 0.5 tablets (50 mg total) by mouth 2 (two) times daily. Take 1 tablet (50 mg) by mouth twice daily  . Multiple Vitamins-Minerals (PRESERVISION AREDS 2 PO) Take by mouth 2 (two) times daily.  . nitroGLYCERIN (NITROSTAT) 0.4 MG SL tablet Place 1 tablet (0.4 mg  total) under the tongue every 5 (five) minutes as needed for chest pain. Maximum of 3 doses.  [name] NOVOFINE 32G X 6 MM MISC USE AS DIRECTED WITH LANTUS PEN  . propranolol ER (INDERAL LA) 60 MG 24 hr capsule Take 60 mg by mouth daily.  . rosuvastatin (CRESTOR) 5 MG tablet TAKE 1 TABLET BY MOUTH EVERY DAY  . TRULICITY 1.5 JS/2.8BT SOPN Inject 1.5 mg as directed once a week.  . vitamin B-12 (CYANOCOBALAMIN) 1000 MCG tablet Take 1,000 mcg by mouth daily.    Allergies: Advil [ibuprofen], Aleve [naproxen], Fluticasone-salmeterol, Lipitor [atorvastatin], Naproxen sodium, and Penicillins  Social History   Tobacco Use  . Smoking status: Former Smoker    Packs/day: 0.25    Years: 1.00    Pack years: 0.25    Types: Cigarettes    Quit date: 1963    Years since quitting: 58.1  . Smokeless tobacco: Never Used  Substance Use Topics  . Alcohol use: No    Alcohol/week: 0.0 standard drinks  . Drug use: No    Family History  Problem Relation Age of Onset  . Alcohol abuse Mother   . Heart disease Mother   . Hypertension Mother   . Breast cancer Mother 58  . Alcohol abuse Father   . Heart disease Father   . Hypertension Father   . Heart disease Brother   . Heart attack Brother     Review of Systems: A 12-system review of systems was  performed and was negative except as noted in the HPI.  --------------------------------------------------------------------------------------------------  Physical Exam: BP 124/68 (BP Location: Left Arm, Patient Position: Sitting, Cuff Size: Normal)   Pulse 81   Ht 5' 2.5" (1.588 m)   Wt 158 lb 8 oz (71.9 kg)   BMI 28.53 kg/m   General: NAD. HEENT: No conjunctival pallor or scleral icterus. Facemask in place. Neck: Supple without lymphadenopathy, thyromegaly, JVD, or HJR. Lungs: Normal work of breathing. Clear to auscultation bilaterally without wheezes or crackles. Heart: Regular rate and rhythm with 1/6 systolic murmur.  No rubs or gallops. Abd: Bowel  sounds present.  Soft and nondistended.  Mild right upper quadrant tenderness.  No hepatosplenomegaly. Ext: No lower extremity edema.  Pedal pulses are trace. Skin: Warm and dry without rash.  EKG: Normal sinus rhythm with left bundle branch block.  No significant change from 05/18/2018.  Lab Results  Component Value Date   WBC 9.7 06/19/2019   HGB 11.9 (L) 06/19/2019   HCT 35.7 (L) 06/19/2019   MCV 88.8 06/19/2019   PLT 211 06/19/2019    Lab Results  Component Value Date   NA 135 06/19/2019   K 3.8 06/19/2019   CL 99 06/19/2019   CO2 29 06/19/2019   BUN 15 06/19/2019   CREATININE 1.21 (H) 06/19/2019   GLUCOSE 129 (H) 06/19/2019   ALT 20 06/19/2019    Lab Results  Component Value Date   CHOL 134 11/25/2017   HDL 45 11/25/2017   LDLCALC 70 11/25/2017   LDLDIRECT 178.7 10/02/2012   TRIG 96 11/25/2017   CHOLHDL 3.0 11/25/2017    --------------------------------------------------------------------------------------------------  ASSESSMENT AND PLAN: Nonobstructive coronary artery disease: Ms. Menchaca does not have any signs or symptoms to suggest worsening coronary insufficiency.  Continue current medications for secondary prevention, including aspirin, rosuvastatin, and propranolol.  We will check a lipid panel today to ensure that her cholesterol is adequately controlled.  Peripheral vascular disease: Small vessel disease noted on prior aortogram and runoff.  Ms. Ogan does not have any claudication.  We will continue with lipid control and antiplatelet therapy.  No further intervention planned at this time.  Nonischemic cardiomyopathy: Ms. Pilkenton appears euvolemic and well compensated with NYHA I-II symptoms.  I encouraged her to continue exercising, as tolerated.  We will continue current doses of losartan and propranolol (switched to this medication for concurrent treatment of essential tremor).  We will plan to repeat an echocardiogram shortly before follow-up visit  to ensure that her LVEF is stable or improved.  Continue furosemide 20 mg daily.  Hyperlipidemia: Last lipid panel in 2019 showed borderline LDL at 70.  We will recheck a lipid panel today and plan to continue rosuvastatin (may need to increase dose if LDL above 70).  Hypertension: Blood pressure well controlled in the office today and predominantly at home as well.  Continue current regimen.  Follow-up: Return to clinic in 6 months.  Nelva Bush, MD 08/01/2019 8:43 AM

## 2019-08-02 LAB — LIPID PANEL
Chol/HDL Ratio: 3.5 ratio (ref 0.0–4.4)
Cholesterol, Total: 156 mg/dL (ref 100–199)
HDL: 45 mg/dL (ref >39)
LDL Chol Calc (NIH): 88 mg/dL (ref 0–99)
Triglycerides: 131 mg/dL (ref 0–149)
VLDL Cholesterol Cal: 23 mg/dL (ref 5–40)

## 2019-08-03 ENCOUNTER — Telehealth: Payer: Self-pay | Admitting: *Deleted

## 2019-08-03 DIAGNOSIS — Z79899 Other long term (current) drug therapy: Secondary | ICD-10-CM

## 2019-08-03 DIAGNOSIS — E785 Hyperlipidemia, unspecified: Secondary | ICD-10-CM

## 2019-08-03 MED ORDER — ROSUVASTATIN CALCIUM 10 MG PO TABS: 10.0000 mg | ORAL_TABLET | Freq: Every day | ORAL | 1 refills | Status: DC

## 2019-08-03 NOTE — Telephone Encounter (Signed)
-----   Message from Nelva Bush, MD sent at 08/02/2019 11:48 AM EST ----- Please let Ms. Dossantos know that her LDL is slightly above goal at 88 (goal < 70).  I recommend that we increase rosuvastatin to 10 mg daily, with repeat lipid panel and ALT in ~3 months.

## 2019-08-03 NOTE — Telephone Encounter (Signed)
Results called to pt. Pt verbalized understanding of results and recommendations. Rx sent to pharmacy. Patient will plan to get lab work on May 3rd at the [facility].  She is aware to be fasting.

## 2019-08-08 DIAGNOSIS — M5441 Lumbago with sciatica, right side: Secondary | ICD-10-CM | POA: Diagnosis not present

## 2019-08-08 DIAGNOSIS — M5442 Lumbago with sciatica, left side: Secondary | ICD-10-CM | POA: Diagnosis not present

## 2019-08-22 DIAGNOSIS — E1129 Type 2 diabetes mellitus with other diabetic kidney complication: Secondary | ICD-10-CM | POA: Diagnosis not present

## 2019-08-22 DIAGNOSIS — N1832 Chronic kidney disease, stage 3b: Secondary | ICD-10-CM | POA: Diagnosis not present

## 2019-08-22 DIAGNOSIS — I10 Essential (primary) hypertension: Secondary | ICD-10-CM | POA: Diagnosis not present

## 2019-09-12 DIAGNOSIS — R8290 Unspecified abnormal findings in urine: Secondary | ICD-10-CM | POA: Diagnosis not present

## 2019-09-12 DIAGNOSIS — N1832 Chronic kidney disease, stage 3b: Secondary | ICD-10-CM | POA: Diagnosis not present

## 2019-09-18 ENCOUNTER — Other Ambulatory Visit: Payer: Self-pay | Admitting: Internal Medicine

## 2019-10-11 ENCOUNTER — Other Ambulatory Visit: Payer: Self-pay | Admitting: Oncology

## 2019-10-11 DIAGNOSIS — C50411 Malignant neoplasm of upper-outer quadrant of right female breast: Secondary | ICD-10-CM

## 2019-10-12 DIAGNOSIS — N1832 Chronic kidney disease, stage 3b: Secondary | ICD-10-CM | POA: Diagnosis not present

## 2019-10-12 DIAGNOSIS — E1121 Type 2 diabetes mellitus with diabetic nephropathy: Secondary | ICD-10-CM | POA: Diagnosis not present

## 2019-10-19 DIAGNOSIS — Z794 Long term (current) use of insulin: Secondary | ICD-10-CM | POA: Diagnosis not present

## 2019-10-19 DIAGNOSIS — N1832 Chronic kidney disease, stage 3b: Secondary | ICD-10-CM | POA: Diagnosis not present

## 2019-10-19 DIAGNOSIS — R809 Proteinuria, unspecified: Secondary | ICD-10-CM | POA: Diagnosis not present

## 2019-10-19 DIAGNOSIS — E1129 Type 2 diabetes mellitus with other diabetic kidney complication: Secondary | ICD-10-CM | POA: Diagnosis not present

## 2019-10-19 DIAGNOSIS — E1121 Type 2 diabetes mellitus with diabetic nephropathy: Secondary | ICD-10-CM | POA: Diagnosis not present

## 2019-10-25 DIAGNOSIS — G250 Essential tremor: Secondary | ICD-10-CM | POA: Diagnosis not present

## 2019-10-25 DIAGNOSIS — G620 Drug-induced polyneuropathy: Secondary | ICD-10-CM | POA: Diagnosis not present

## 2019-11-06 ENCOUNTER — Encounter: Payer: Self-pay | Admitting: Family Medicine

## 2019-11-06 ENCOUNTER — Ambulatory Visit: Payer: Medicare PPO | Admitting: Family Medicine

## 2019-11-06 ENCOUNTER — Other Ambulatory Visit: Payer: Self-pay

## 2019-11-06 DIAGNOSIS — I5022 Chronic systolic (congestive) heart failure: Secondary | ICD-10-CM | POA: Diagnosis not present

## 2019-11-06 DIAGNOSIS — I739 Peripheral vascular disease, unspecified: Secondary | ICD-10-CM

## 2019-11-06 DIAGNOSIS — N1832 Chronic kidney disease, stage 3b: Secondary | ICD-10-CM | POA: Diagnosis not present

## 2019-11-06 DIAGNOSIS — I10 Essential (primary) hypertension: Secondary | ICD-10-CM | POA: Diagnosis not present

## 2019-11-06 DIAGNOSIS — H6123 Impacted cerumen, bilateral: Secondary | ICD-10-CM

## 2019-11-06 DIAGNOSIS — E1121 Type 2 diabetes mellitus with diabetic nephropathy: Secondary | ICD-10-CM

## 2019-11-06 DIAGNOSIS — E1122 Type 2 diabetes mellitus with diabetic chronic kidney disease: Secondary | ICD-10-CM

## 2019-11-06 DIAGNOSIS — R2689 Other abnormalities of gait and mobility: Secondary | ICD-10-CM | POA: Diagnosis not present

## 2019-11-06 DIAGNOSIS — R1011 Right upper quadrant pain: Secondary | ICD-10-CM | POA: Insufficient documentation

## 2019-11-06 DIAGNOSIS — Z794 Long term (current) use of insulin: Secondary | ICD-10-CM | POA: Diagnosis not present

## 2019-11-06 LAB — LIPID PANEL
Cholesterol: 151 mg/dL (ref 0–200)
HDL: 47.1 mg/dL (ref >39.00)
LDL Cholesterol: 73 mg/dL (ref 0–99)
NonHDL: 103.59
Total CHOL/HDL Ratio: 3
Triglycerides: 154 mg/dL — ABNORMAL HIGH (ref 0.0–149.0)
VLDL: 30.8 mg/dL (ref 0.0–40.0)

## 2019-11-06 LAB — COMPREHENSIVE METABOLIC PANEL
ALT: 16 U/L (ref 0–35)
AST: 13 U/L (ref 0–37)
Albumin: 4.4 g/dL (ref 3.5–5.2)
Alkaline Phosphatase: 80 U/L (ref 39–117)
BUN: 25 mg/dL — ABNORMAL HIGH (ref 6–23)
CO2: 31 mEq/L (ref 19–32)
Calcium: 9.9 mg/dL (ref 8.4–10.5)
Chloride: 100 mEq/L (ref 96–112)
Creatinine, Ser: 1.41 mg/dL — ABNORMAL HIGH (ref 0.40–1.20)
GFR: 36.01 mL/min — ABNORMAL LOW (ref >60.00)
Glucose, Bld: 205 mg/dL — ABNORMAL HIGH (ref 70–99)
Potassium: 4.4 mEq/L (ref 3.5–5.1)
Sodium: 134 mEq/L — ABNORMAL LOW (ref 135–145)
Total Bilirubin: 0.6 mg/dL (ref 0.2–1.2)
Total Protein: 6.3 g/dL (ref 6.0–8.3)

## 2019-11-06 LAB — HEMOGLOBIN A1C: Hgb A1c MFr Bld: 7.7 % — ABNORMAL HIGH (ref 4.6–6.5)

## 2019-11-06 NOTE — Assessment & Plan Note (Signed)
Check A1c.  Continue current regimen. 

## 2019-11-06 NOTE — Assessment & Plan Note (Addendum)
Possibly related to impacted cerumen.  Could be related to her neuropathy.  We will check a sodium level to see if that is contributing.  Will refer to ENT given tinnitus and ear fullness.

## 2019-11-06 NOTE — Assessment & Plan Note (Signed)
Appears euvolemic.  She will continue her current regimen.  She will continue to see cardiology.

## 2019-11-06 NOTE — Assessment & Plan Note (Signed)
Adequate control. Continue current regimen.  

## 2019-11-06 NOTE — Assessment & Plan Note (Signed)
Brief episode several weeks ago.  Minimal tenderness on exam today.  Possibly muscle strain.  Will check a CMP.

## 2019-11-06 NOTE — Assessment & Plan Note (Addendum)
Ears irrigated.  See exam.  Successful in the right though not completely successful in the left.  Refer to ENT.

## 2019-11-06 NOTE — Assessment & Plan Note (Signed)
She reports mild claudication symptoms.  She will continue risk factor management.  I will send my note to her cardiologist to make them aware.

## 2019-11-06 NOTE — Patient Instructions (Addendum)
Nice to see you. We will get labs today and contact you with the results. We will refer you to ENT.

## 2019-11-06 NOTE — Progress Notes (Signed)
[name], MD Phone: [phone]  Kelsey Mason is a 79 y.o. female who presents today for f/u.  HYPERTENSION/CHF/HLD Disease Monitoring  Home BP Monitoring not checking BP Chest pain- no    Dyspnea- no Medications  Compliance-  Taking lasix, propranolol, crestor, losartan. Lightheadedness-  no  Edema- no No orthopnea or PND. Does have chronic mild claudication and follows with cardiology for this.   DIABETES Disease Monitoring: Blood Sugar ranges-113 in am, though afternoon in to the 200-300 range, typically has lots of carbsin the morning Polyuria/phagia/dipsia- no      Optho- UTD Medications: Compliance- taking glimeperide, lantus 30 u daily, trulicity Hypoglycemic symptoms- no  Balance difficulty: Patient notes this has been going on for a few months.  If she leans one way it feels like she just wants to keep going.  No vertigo.  No lightheadedness.  She notes her right ear always feels full.  She had this about 60 years ago and she had an inner ear infection.  She has chronic tinnitus bilaterally.  Right upper quadrant pain: Patient notes 3 weeks ago when she was loading groceries she developed sudden onset right upper quadrant discomfort.  This lasted briefly.  She felt nauseous with it though had no vomiting.  She has not had any recurrence.  No pain with urination.  No blood in her urine.  No vaginal bleeding.  No blood in her stool.     Social History   Tobacco Use  Smoking Status Former Smoker  . Packs/day: 0.25  . Years: 1.00  . Pack years: 0.25  . Types: Cigarettes  . Quit date: 1963  . Years since quitting: 58.3  Smokeless Tobacco Never Used     ROS see history of present illness  Objective  Physical Exam Vitals:   11/06/19 0947  BP: 130/80  Pulse: 75  Temp: 97.6 F (36.4 C)  SpO2: 99%    BP Readings from Last 3 Encounters:  11/06/19 130/80  08/01/19 124/68  06/19/19 (!) 160/81   Wt Readings from Last 3 Encounters:  11/06/19  163 lb 3.2 oz (74 kg)  08/01/19 158 lb 8 oz (71.9 kg)  06/19/19 159 lb (72.1 kg)    Physical Exam Constitutional:      General: She is not in acute distress.    Appearance: She is not diaphoretic.  HENT:     Ears:     Comments: Cerumen impaction bilateral ears, these were irrigated by CMA, right ear successfully irrigated revealing normal TM, left ear with retained cerumen though the superior portion of her TM appears normal Eyes:     Extraocular Movements: Extraocular movements intact.     Pupils: Pupils are equal, round, and reactive to light.  Cardiovascular:     Rate and Rhythm: Normal rate and regular rhythm.     Heart sounds: Normal heart sounds.  Pulmonary:     Effort: Pulmonary effort is normal.     Breath sounds: Normal breath sounds.  Abdominal:     General: Bowel sounds are normal. There is no distension.     Palpations: Abdomen is soft.     Tenderness: There is abdominal tenderness (Mild tenderness right upper quadrant).  Musculoskeletal:     Right lower leg: No edema.     Left lower leg: No edema.  Skin:    General: Skin is warm and dry.  Neurological:     Mental Status: She is alert.     Comments: Hearing intact bilaterally to finger rub,  light touch sensation intact throughout her face, shoulder shrug intact, 5/5 strength in bilateral biceps, triceps, grip, quads, hamstrings, plantar and dorsiflexion, sensation to light touch intact in bilateral UE and LE, normal gait, negative Romberg, no pronator drift      Assessment/Plan: Please see individual problem list.  Essential hypertension Adequate control.  Continue current regimen.  Chronic systolic congestive heart failure (HCC) Appears euvolemic.  She will continue her current regimen.  She will continue to see cardiology.  Peripheral vascular disease, unspecified ([place]) She reports mild claudication symptoms.  She will continue risk factor management.  I will send my note to her cardiologist to make them  aware.  Type 2 diabetes mellitus with stage 3 chronic kidney disease, with long-term current use of insulin (HCC) Check A1c.  Continue current regimen.  Cerumen impaction Ears irrigated.  See exam.  Successful in the right though not completely successful in the left.  Refer to ENT.  Right upper quadrant pain Brief episode several weeks ago.  Minimal tenderness on exam today.  Possibly muscle strain.  Will check a CMP.  Balance problem Possibly related to impacted cerumen.  Could be related to her neuropathy.  We will check a sodium level to see if that is contributing.  Will refer to ENT given tinnitus and ear fullness.   Orders Placed This Encounter  Procedures  . Lipid panel  . Comp Met (CMET)  . HgB A1c  . Ambulatory referral to ENT    Referral Priority:   Routine    Referral Type:   Consultation    Referral Reason:   Specialty Services Required    Requested Specialty:   Otolaryngology    Number of Visits Requested:   1    No orders of the defined types were placed in this encounter.   This visit occurred during the SARS-CoV-2 public health emergency.  Safety protocols were in place, including screening questions prior to the visit, additional usage of staff PPE, and extensive cleaning of exam room while observing appropriate contact time as indicated for disinfecting solutions.   I have spent 42 minutes in the care of this patient regarding history taking, exam, placing orders, documentation, and sending communication to her cardiologist.   [name], MD [place]

## 2019-11-08 DIAGNOSIS — R42 Dizziness and giddiness: Secondary | ICD-10-CM | POA: Diagnosis not present

## 2019-11-08 DIAGNOSIS — H903 Sensorineural hearing loss, bilateral: Secondary | ICD-10-CM | POA: Diagnosis not present

## 2019-11-08 DIAGNOSIS — H9319 Tinnitus, unspecified ear: Secondary | ICD-10-CM | POA: Diagnosis not present

## 2019-11-12 ENCOUNTER — Telehealth (INDEPENDENT_AMBULATORY_CARE_PROVIDER_SITE_OTHER): Payer: Medicare PPO | Admitting: Family Medicine

## 2019-11-12 ENCOUNTER — Encounter: Payer: Self-pay | Admitting: Family Medicine

## 2019-11-12 ENCOUNTER — Other Ambulatory Visit: Payer: Self-pay

## 2019-11-12 ENCOUNTER — Other Ambulatory Visit: Payer: Self-pay | Admitting: Family Medicine

## 2019-11-12 DIAGNOSIS — E1121 Type 2 diabetes mellitus with diabetic nephropathy: Secondary | ICD-10-CM | POA: Diagnosis not present

## 2019-11-12 DIAGNOSIS — R059 Cough, unspecified: Secondary | ICD-10-CM

## 2019-11-12 DIAGNOSIS — E785 Hyperlipidemia, unspecified: Secondary | ICD-10-CM | POA: Diagnosis not present

## 2019-11-12 DIAGNOSIS — R05 Cough: Secondary | ICD-10-CM | POA: Diagnosis not present

## 2019-11-12 DIAGNOSIS — R053 Chronic cough: Secondary | ICD-10-CM | POA: Insufficient documentation

## 2019-11-12 DIAGNOSIS — Z794 Long term (current) use of insulin: Secondary | ICD-10-CM | POA: Diagnosis not present

## 2019-11-12 DIAGNOSIS — N1832 Chronic kidney disease, stage 3b: Secondary | ICD-10-CM

## 2019-11-12 MED ORDER — BENZONATATE 200 MG PO CAPS: 200.0000 mg | ORAL_CAPSULE | Freq: Two times a day (BID) | ORAL | 0 refills | Status: DC | PRN

## 2019-11-12 MED ORDER — ROSUVASTATIN CALCIUM 10 MG PO TABS: 20.0000 mg | ORAL_TABLET | Freq: Every day | ORAL | 1 refills | Status: DC

## 2019-11-12 NOTE — Assessment & Plan Note (Signed)
Discussed that this could represent a number of things.  Discussed the possibility of viral bronchitis versus allergies versus COVID-19.  I encouraged her to get tested for COVID-19 and gave her the website to go to schedule testing.  Advised to stay quarantined at home until the test result comes back.  She noted she has to go to the pharmacy to get medication today and I advised that she wear a mask and go through the drive-through.  Discussed reasons to seek medical attention in the emergency room.  Tessalon for cough.

## 2019-11-12 NOTE — Assessment & Plan Note (Signed)
Discussed trying the 20 mg dose of Crestor again to see if it does make her dizzy.  That is a fairly uncommon side effect.  If it does make her dizzy she can decrease the dose again and let us know.

## 2019-11-12 NOTE — Assessment & Plan Note (Signed)
Patient would like to stay on her current dose of Trulicity.  Plan to recheck in 3 months.

## 2019-11-12 NOTE — Progress Notes (Signed)
Virtual Visit via telephone Note  This visit type was conducted due to national recommendations for restrictions regarding the COVID-19 pandemic (e.g. social distancing).  This format is felt to be most appropriate for this patient at this time.  All issues noted in this document were discussed and addressed.  No physical exam was performed (except for noted visual exam findings with Video Visits).   I connected with Kelsey Mason today at  3:15 PM EDT by telephone and verified that I am speaking with the correct person using two identifiers. Location patient: home Location provider: work  Persons participating in the virtual visit: patient, provider  I discussed the limitations, risks, security and privacy concerns of performing an evaluation and management service by telephone and the availability of in person appointments. I also discussed with the patient that there may be a patient responsible charge related to this service. The patient expressed understanding and agreed to proceed.  Interactive audio and video telecommunications were attempted between this provider and patient, however failed, due to patient having technical difficulties OR patient did not have access to video capability.  We continued and completed visit with audio only.   Reason for visit: same day visit.  HPI: Cough: Patient notes this started 5 days ago.  Cough is nonproductive.  Her chest feels congested.  Her sinuses feel swollen.  No shortness of breath.  No fevers.  Her right ear does hurt a little bit.  No taste or smell disturbances.  No known COVID-19 exposure.  She is fully vaccinated against Covid.  She notes it feels like bronchitis.  A1c improved to 7.7.  She has 11 Trulicity pens and does not want to increase the dose yet.  LDL above goal.  She notes she tried increasing the Crestor last week to 20 mg and felt a little dizzy.   ROS: See pertinent positives and negatives per HPI.  Past Medical  History:  Diagnosis Date  . Breast symptom 2014  . Cancer [facility]) 2014   Bilateral Breast, chemo Dr. Jeb Levering  . Diabetes mellitus   . GERD (gastroesophageal reflux disease)    mild  . Hypertension   . Personal history of chemotherapy   . Personal history of radiation therapy 2014   bilat lumpectomy    Past Surgical History:  Procedure Laterality Date  . ABDOMINAL AORTOGRAM W/LOWER EXTREMITY N/A 04/21/2018   Procedure: ABDOMINAL AORTOGRAM W/LOWER EXTREMITY;  Surgeon: Nelva Bush, MD;  Location: [place] [facility];  Service: Cardiovascular;  Laterality: N/A;  . ABDOMINAL HYSTERECTOMY  1976   for pelvic pain  . BREAST EXCISIONAL BIOPSY Bilateral 11/2012   bilat breast ca chemo and rad  . BREAST SURGERY Bilateral 2014   bilateral lumpectomy and SN biopsy  . CHOLECYSTECTOMY  2007  . COLONOSCOPY WITH PROPOFOL N/A 10/13/2015   Procedure: COLONOSCOPY WITH PROPOFOL;  Surgeon: Manya Silvas, MD;  Location[facility] ENDOSCOPY;  Service: Endoscopy;  Laterality: N/A;  . LEFT HEART CATH AND CORONARY ANGIOGRAPHY N/A 04/21/2018   Procedure: LEFT HEART CATH AND CORONARY ANGIOGRAPHY;  Surgeon: Nelva Bush, MD;  Location: [place] [facility];  Service: Cardiovascular;  Laterality: N/A;  . OOPHORECTOMY Right   . PORTACATH PLACEMENT  2014  . TUMOR REMOVAL Right 1980   right foot    Family History  Problem Relation Age of Onset  . Alcohol abuse Mother   . Heart disease Mother   . Hypertension Mother   . Breast cancer Mother 71  . Alcohol abuse Father   .  Heart disease Father   . Hypertension Father   . Heart disease Brother   . Heart attack Brother     SOCIAL HX: Former smoker   Current Outpatient Medications:  .  aspirin 81 MG tablet, Take 81 mg by mouth daily., Disp: , Rfl:  .  benzonatate (TESSALON) 200 MG capsule, Take 1 capsule (200 mg total) by mouth 2 (two) times daily as needed for cough., Disp: 20 capsule, Rfl: 0 .  blood glucose meter kit and supplies KIT, Dispense based  on patient and insurance preference. Use up to four times daily as directed. One Touch Ultra mini meter (FOR ICD-9 250.00, 250.01). Dx: E11.9, Disp: 1 each, Rfl: 0 .  cholecalciferol (VITAMIN D) 25 MCG (1000 UT) tablet, Take 1,000 Units by mouth daily., Disp: , Rfl:  .  furosemide (LASIX) 20 MG tablet, Take 1 tablet (20 mg total) by mouth daily., Disp: 90 tablet, Rfl: 3 .  glimepiride (AMARYL) 2 MG tablet, TAKE 1 TABLET (2 MG TOTAL) BY MOUTH 2 (TWO) TIMES DAILY. (Patient taking differently: Take 2 mg by mouth daily with breakfast. TAKE 1 TABLET (2 MG TOTAL) BY MOUTH 2 (TWO) TIMES DAILY.), Disp: 180 tablet, Rfl: 1 .  glucose blood (ONE TOUCH ULTRA TEST) test strip, USE TO TEST BLOOD SUGAR UP TO 4 TIMES DAILY, Disp: 100 each, Rfl: 6 .  LANTUS SOLOSTAR 100 UNIT/ML Solostar Pen, INJECT 20 UNITS INTO THE SKIN DAILY AT 10 PM. (Patient taking differently: Inject 28 Units into the skin at bedtime. ), Disp: 18 pen, Rfl: 0 .  letrozole (FEMARA) 2.5 MG tablet, TAKE 1 TABLET BY MOUTH EVERY DAY, Disp: 90 tablet, Rfl: 0 .  loratadine-pseudoephedrine (CLARITIN-D 24-HOUR) 10-240 MG 24 hr tablet, Take 1 tablet by mouth daily., Disp: , Rfl:  .  losartan (COZAAR) 100 MG tablet, TAKE 0.5 TABLETS (50 MG TOTAL) BY MOUTH 2 (TWO) TIMES DAILY., Disp: 90 tablet, Rfl: 0 .  Multiple Vitamins-Minerals (PRESERVISION AREDS 2 PO), Take by mouth 2 (two) times daily., Disp: , Rfl:  .  nitroGLYCERIN (NITROSTAT) 0.4 MG SL tablet, Place 1 tablet (0.4 mg total) under the tongue every 5 (five) minutes as needed for chest pain. Maximum of 3 doses., Disp: 25 tablet, Rfl: 3 .  NOVOFINE 32G X 6 MM MISC, USE AS DIRECTED WITH LANTUS PEN, Disp: 100 each, Rfl: 2 .  propranolol ER (INDERAL LA) 60 MG 24 hr capsule, Take 60 mg by mouth daily., Disp: , Rfl:  .  rosuvastatin (CRESTOR) 10 MG tablet, Take 2 tablets (20 mg total) by mouth daily., Disp: 180 tablet, Rfl: 1 .  TRULICITY 1.5 OI/3.2PQ SOPN, Inject 1.5 mg as directed once a week., Disp: , Rfl:  11 .  vitamin B-12 (CYANOCOBALAMIN) 1000 MCG tablet, Take 1,000 mcg by mouth daily., Disp: , Rfl:   EXAM: This is a telehealth telephone visit and thus no physical exam was completed.  ASSESSMENT AND PLAN:  Discussed the following assessment and plan:  Cough Discussed that this could represent a number of things.  Discussed the possibility of viral bronchitis versus allergies versus COVID-19.  I encouraged her to get tested for COVID-19 and gave her the website to go to schedule testing.  Advised to stay quarantined at home until the test result comes back.  She noted she has to go to the pharmacy to get medication today and I advised that she wear a mask and go through the drive-through.  Discussed reasons to seek medical attention in the emergency room.  Tessalon for cough.  Hyperlipidemia LDL goal <70 Discussed trying the 20 mg dose of Crestor again to see if it does make her dizzy.  That is a fairly uncommon side effect.  If it does make her dizzy she can decrease the dose again and let us know.  Type 2 diabetes mellitus with stage 3 chronic kidney disease, with long-term current use of insulin ([place]) Patient would like to stay on her current dose of Trulicity.  Plan to recheck in 3 months.   No orders of the defined types were placed in this encounter.   Meds ordered this encounter  Medications  . rosuvastatin (CRESTOR) 10 MG tablet    Sig: Take 2 tablets (20 mg total) by mouth daily.    Dispense:  180 tablet    Refill:  1  . benzonatate (TESSALON) 200 MG capsule    Sig: Take 1 capsule (200 mg total) by mouth 2 (two) times daily as needed for cough.    Dispense:  20 capsule    Refill:  0     I discussed the assessment and treatment plan with the patient. The patient was provided an opportunity to ask questions and all were answered. The patient agreed with the plan and demonstrated an understanding of the instructions.   The patient was advised to call back or seek an  in-person evaluation if the symptoms worsen or if the condition fails to improve as anticipated.  I provided 11 minutes of non-face-to-face time during this encounter.   [name], MD

## 2019-11-13 DIAGNOSIS — Z03818 Encounter for observation for suspected exposure to other biological agents ruled out: Secondary | ICD-10-CM | POA: Diagnosis not present

## 2019-11-13 DIAGNOSIS — Z20828 Contact with and (suspected) exposure to other viral communicable diseases: Secondary | ICD-10-CM | POA: Diagnosis not present

## 2019-11-16 ENCOUNTER — Telehealth: Payer: Self-pay | Admitting: Family Medicine

## 2019-11-16 NOTE — Telephone Encounter (Signed)
Patient Stated she has only coughed but no where near as bad as before. She has no other SX at this time.

## 2019-11-16 NOTE — Telephone Encounter (Signed)
Noted. Is she still having issues with her cough? Is she having any fevers or other symptoms?

## 2019-11-16 NOTE — Telephone Encounter (Signed)
Pt called she got her covid test results back and it was negative

## 2019-11-16 NOTE — Telephone Encounter (Signed)
Noted. She should continue to monitor. I suspect that she will continue to improve. If she gets worse she should let us know.

## 2019-12-20 ENCOUNTER — Ambulatory Visit
Admission: RE | Admit: 2019-12-20 | Discharge: 2019-12-20 | Disposition: A | Discharge status: Home / Self Care | Payer: Medicare PPO | Source: Ambulatory Visit | Attending: Oncology | Admitting: Oncology

## 2019-12-20 ENCOUNTER — Other Ambulatory Visit: Payer: Self-pay | Admitting: Internal Medicine

## 2019-12-20 DIAGNOSIS — M8589 Other specified disorders of bone density and structure, multiple sites: Secondary | ICD-10-CM | POA: Diagnosis not present

## 2019-12-20 DIAGNOSIS — Z853 Personal history of malignant neoplasm of breast: Secondary | ICD-10-CM | POA: Insufficient documentation

## 2019-12-20 DIAGNOSIS — Z780 Asymptomatic menopausal state: Secondary | ICD-10-CM | POA: Diagnosis not present

## 2019-12-20 DIAGNOSIS — M8580 Other specified disorders of bone density and structure, unspecified site: Secondary | ICD-10-CM | POA: Diagnosis not present

## 2019-12-20 DIAGNOSIS — M810 Age-related osteoporosis without current pathological fracture: Secondary | ICD-10-CM | POA: Diagnosis not present

## 2019-12-20 DIAGNOSIS — Z79811 Long term (current) use of aromatase inhibitors: Secondary | ICD-10-CM | POA: Diagnosis not present

## 2019-12-24 ENCOUNTER — Other Ambulatory Visit: Payer: Self-pay

## 2019-12-24 ENCOUNTER — Encounter: Payer: Self-pay | Admitting: Oncology

## 2019-12-24 ENCOUNTER — Inpatient Hospital Stay: Payer: Medicare PPO | Admitting: Oncology

## 2019-12-24 ENCOUNTER — Inpatient Hospital Stay: Payer: Medicare PPO | Attending: Oncology

## 2019-12-24 VITALS — BP 120/77 | HR 71 | Temp 97.4°F | Resp 18 | Wt 162.2 lb

## 2019-12-24 DIAGNOSIS — D649 Anemia, unspecified: Secondary | ICD-10-CM | POA: Diagnosis not present

## 2019-12-24 DIAGNOSIS — K219 Gastro-esophageal reflux disease without esophagitis: Secondary | ICD-10-CM | POA: Diagnosis not present

## 2019-12-24 DIAGNOSIS — Z853 Personal history of malignant neoplasm of breast: Secondary | ICD-10-CM

## 2019-12-24 DIAGNOSIS — M810 Age-related osteoporosis without current pathological fracture: Secondary | ICD-10-CM | POA: Diagnosis not present

## 2019-12-24 DIAGNOSIS — I129 Hypertensive chronic kidney disease with stage 1 through stage 4 chronic kidney disease, or unspecified chronic kidney disease: Secondary | ICD-10-CM | POA: Insufficient documentation

## 2019-12-24 DIAGNOSIS — Z9221 Personal history of antineoplastic chemotherapy: Secondary | ICD-10-CM | POA: Diagnosis not present

## 2019-12-24 DIAGNOSIS — Z923 Personal history of irradiation: Secondary | ICD-10-CM | POA: Insufficient documentation

## 2019-12-24 DIAGNOSIS — Z170 Estrogen receptor positive status [ER+]: Secondary | ICD-10-CM | POA: Insufficient documentation

## 2019-12-24 DIAGNOSIS — E1122 Type 2 diabetes mellitus with diabetic chronic kidney disease: Secondary | ICD-10-CM | POA: Diagnosis not present

## 2019-12-24 DIAGNOSIS — Z79811 Long term (current) use of aromatase inhibitors: Secondary | ICD-10-CM | POA: Diagnosis not present

## 2019-12-24 DIAGNOSIS — Z87891 Personal history of nicotine dependence: Secondary | ICD-10-CM | POA: Diagnosis not present

## 2019-12-24 DIAGNOSIS — R110 Nausea: Secondary | ICD-10-CM | POA: Diagnosis not present

## 2019-12-24 DIAGNOSIS — N184 Chronic kidney disease, stage 4 (severe): Secondary | ICD-10-CM | POA: Diagnosis not present

## 2019-12-24 DIAGNOSIS — C50912 Malignant neoplasm of unspecified site of left female breast: Secondary | ICD-10-CM | POA: Insufficient documentation

## 2019-12-24 DIAGNOSIS — Z79899 Other long term (current) drug therapy: Secondary | ICD-10-CM | POA: Diagnosis not present

## 2019-12-24 DIAGNOSIS — M816 Localized osteoporosis [Lequesne]: Secondary | ICD-10-CM

## 2019-12-24 LAB — COMPREHENSIVE METABOLIC PANEL
ALT: 18 U/L (ref 0–44)
AST: 14 U/L — ABNORMAL LOW (ref 15–41)
Albumin: 4.1 g/dL (ref 3.5–5.0)
Alkaline Phosphatase: 85 U/L (ref 38–126)
Anion gap: 12 (ref 5–15)
BUN: 22 mg/dL (ref 8–23)
CO2: 27 mmol/L (ref 22–32)
Calcium: 8.8 mg/dL — ABNORMAL LOW (ref 8.9–10.3)
Chloride: 96 mmol/L — ABNORMAL LOW (ref 98–111)
Creatinine, Ser: 1.81 mg/dL — ABNORMAL HIGH (ref 0.44–1.00)
GFR calc Af Amer: 30 mL/min — ABNORMAL LOW (ref >60)
GFR calc non Af Amer: 26 mL/min — ABNORMAL LOW (ref >60)
Glucose, Bld: 257 mg/dL — ABNORMAL HIGH (ref 70–99)
Potassium: 3.3 mmol/L — ABNORMAL LOW (ref 3.5–5.1)
Sodium: 135 mmol/L (ref 135–145)
Total Bilirubin: 0.7 mg/dL (ref 0.3–1.2)
Total Protein: 6.8 g/dL (ref 6.5–8.1)

## 2019-12-24 LAB — CBC WITH DIFFERENTIAL/PLATELET
Abs Immature Granulocytes: 0.07 10*3/uL (ref 0.00–0.07)
Basophils Absolute: 0.1 10*3/uL (ref 0.0–0.1)
Basophils Relative: 1 %
Eosinophils Absolute: 0.7 10*3/uL — ABNORMAL HIGH (ref 0.0–0.5)
Eosinophils Relative: 7 %
HCT: 33.1 % — ABNORMAL LOW (ref 36.0–46.0)
Hemoglobin: 11.5 g/dL — ABNORMAL LOW (ref 12.0–15.0)
Immature Granulocytes: 1 %
Lymphocytes Relative: 12 %
Lymphs Abs: 1.2 10*3/uL (ref 0.7–4.0)
MCH: 30.1 pg (ref 26.0–34.0)
MCHC: 34.7 g/dL (ref 30.0–36.0)
MCV: 86.6 fL (ref 80.0–100.0)
Monocytes Absolute: 0.6 10*3/uL (ref 0.1–1.0)
Monocytes Relative: 6 %
Neutro Abs: 7.5 10*3/uL (ref 1.7–7.7)
Neutrophils Relative %: 73 %
Platelets: 197 10*3/uL (ref 150–400)
RBC: 3.82 MIL/uL — ABNORMAL LOW (ref 3.87–5.11)
RDW: 12.2 % (ref 11.5–15.5)
WBC: 10 10*3/uL (ref 4.0–10.5)
nRBC: 0 % (ref 0.0–0.2)

## 2019-12-24 NOTE — Progress Notes (Signed)
Patient denies new problems/concerns today.   °

## 2019-12-24 NOTE — Progress Notes (Signed)
[facility] day:  12/24/2019  Chief Complaint: Kelsey Mason is a 79 y.o. female with bilateral stage IA breast cancer who is seen for 6 month assessment on Femara.  PERTINENT ONCOLOGY HISTORY Kelsey Mason is a 79 y.o.afemale who has above oncology history reviewed by me today presented for follow up visit for management of history of bilateral breast cancer. Patient previously follows up with Dr. Mike Gip. Switched care to me on 09/18/2018. Medical record review was performed by me.  bilateral breast cancer s/p lumpectomy and sentinel lymph node biopsy on 11/17/2012.  Right breast revealed a 5 mm grade I invasive carcinoma.  There was no DCIS.  Two sentinel lymph nodes were negative.  Pathologic stage was T1aN0M0.  Tumor was ER + (90%), PR + (20%), and Her2/neu -.   Left breast revealed a 1.8 cm grade III invasive ductal carcinoma.  There was lymphovascular invasion.  There was no DCIS.  One sentinel lymph node was negative.  Pathologic stage was T1cN0M0.  Tumor was ER + (>90%), PR + (1-5%), and Her2/neu -.  Oncotype DX testing on the left breast mass revealed a recurrence score of 28 which corresponded to a 10 year risk of distant recurrence of 19% (CI 15-23%) with tamoxifen alone.  BCI testing on 10/04/2017 revealed a high risk of late recurrence (5.4%; CI 2.5%-8.2%) during years 5-10.  There was a low likelihood of benefit from extended endocrine therapy.  She received Adriamycin and Cytoxan (AC) x 4 cycles followed by weekly Taxol x 10 (completed 06/05/2013).  She did not receive 2 cycles of Taxol secondary to progressive neuropathy.  She received bilateral breast radiation (07/2013 - 09/2013).  She began letrozole (Femara) in 09/2013.  CA27.29 has been followed:  24.4 on 09/10/2016, 28.1 on 03/14/2017, 24.9 on 09/12/2017, and 22.8 on 03/13/2018. She has a history of a borderline mild normocytic anemia.  Diet appears modest.   She denies any melena, hematochezia, hematuria or vaginal bleeding. Colonoscopy on 10/13/2015 was negative.    Soft tissue neck ultrasound on 03/16/2017 revealed no fluid collection or soft tissue lesion.   INTERVAL HISTORY Kelsey Mason is a 79 y.o. female who has above history reviewed by me today presents for follow up visit for management of bilateral breast cancer. Problems and complaints are listed below: Patient reports no new complaints. Patient has been taking letrozole 2.5 mg daily since March 2015. In general she tolerates with manageable visual symptoms. Annual mammogram was done in November 2020 .  She had a bone density done on 12/20/2019  Patient reports feeling well today.  Reports intermittent nausea at times.  Denies any nausea today. She denies any concern of her breast. Patient takes letrozole daily.  She reports tolerating well with manageable vasomotor symptoms. Patient has had her annual mammogram done in November 2020.  Which showed osteoporosis at her left femoral neck.  Breast area osteopenia Patient continues to have nausea occasionally.  No vomiting episodes.  Review of Systems  Constitutional: Negative for appetite change, chills, fatigue and fever.  HENT:   Negative for hearing loss and voice change.   Eyes: Negative for eye problems.  Respiratory: Negative for chest tightness and cough.   Cardiovascular: Negative for chest pain.  Gastrointestinal: Positive for nausea. Negative for abdominal distention, abdominal pain and blood in stool.  Endocrine: Positive for hot flashes.  Genitourinary: Negative for difficulty urinating and frequency.   Musculoskeletal: Negative for arthralgias.  Skin: Negative for  itching and rash.  Neurological: Negative for extremity weakness.  Hematological: Negative for adenopathy.  Psychiatric/Behavioral: Negative for confusion.     Past Medical History:  Diagnosis Date  . Breast symptom 2014  . Cancer [facility]) 2014    Bilateral Breast, chemo Dr. Jeb Levering  . Diabetes mellitus   . GERD (gastroesophageal reflux disease)    mild  . Hypertension   . Personal history of chemotherapy   . Personal history of radiation therapy 2014   bilat lumpectomy    Past Surgical History:  Procedure Laterality Date  . ABDOMINAL AORTOGRAM W/LOWER EXTREMITY N/A 04/21/2018   Procedure: ABDOMINAL AORTOGRAM W/LOWER EXTREMITY;  Surgeon: Nelva Bush, MD;  Location: [facility];  Service: Cardiovascular;  Laterality: N/A;  . ABDOMINAL HYSTERECTOMY  1976   for pelvic pain  . BREAST EXCISIONAL BIOPSY Bilateral 11/2012   bilat breast ca chemo and rad  . BREAST SURGERY Bilateral 2014   bilateral lumpectomy and SN biopsy  . CHOLECYSTECTOMY  2007  . COLONOSCOPY WITH PROPOFOL N/A 10/13/2015   Procedure: COLONOSCOPY WITH PROPOFOL;  Surgeon: Manya Silvas, MD;  Location: [facility] ENDOSCOPY;  Service: Endoscopy;  Laterality: N/A;  . LEFT HEART CATH AND CORONARY ANGIOGRAPHY N/A 04/21/2018   Procedure: LEFT HEART CATH AND CORONARY ANGIOGRAPHY;  Surgeon: Nelva Bush, MD;  Location: [facility];  Service: Cardiovascular;  Laterality: N/A;  . OOPHORECTOMY Right   . PORTACATH PLACEMENT  2014  . TUMOR REMOVAL Right 1980   right foot    Family History  Problem Relation Age of Onset  . Alcohol abuse Mother   . Heart disease Mother   . Hypertension Mother   . Breast cancer Mother 41  . Alcohol abuse Father   . Heart disease Father   . Hypertension Father   . Heart disease Brother   . Heart attack Brother    Social History   Socioeconomic History  . Marital status: Widowed    Spouse name: Not on file  . Number of children: Not on file  . Years of education: Not on file  . Highest education level: Not on file  Occupational History  . Not on file  Tobacco Use  . Smoking status: Former Smoker    Packs/day: 0.25    Years: 1.00    Pack years: 0.25    Types: Cigarettes    Quit date: 1963    Years since  quitting: 58.5  . Smokeless tobacco: Never Used  Vaping Use  . Vaping Use: Never used  Substance and Sexual Activity  . Alcohol use: No    Alcohol/week: 0.0 standard drinks  . Drug use: No  . Sexual activity: Never  Other Topics Concern  . Not on file  Social History Narrative   Lives in [place].       Works - [employer]   Diet - regular   Exercise - dance 3x per week   Social Determinants of Radio broadcast assistant Strain:   . Difficulty of Paying Living Expenses:   Food Insecurity: No Food Insecurity  . Worried About Charity fundraiser in the Last Year: Never true  . Ran Out of Food in the Last Year: Never true  Transportation Needs:   . Lack of Transportation (Medical):   [name] Lack of Transportation (Non-Medical):   Physical Activity:   . Days of Exercise per Week:   . Minutes of Exercise per Session:   Stress:   . Feeling of Stress :  Social Connections:   . Frequency of Communication with Friends and Family:   . Frequency of Social Gatherings with Friends and Family:   . Attends Religious Services:   . Active Member of Clubs or Organizations:   . Attends Archivist Meetings:   [name] Marital Status:   Intimate Partner Violence:   . Fear of Current or Ex-Partner:   . Emotionally Abused:   [name] Physically Abused:   . Sexually Abused:      She lives by herself in [place].  Husband passed away many years ago.  The patient is alone today.  Allergies:  Allergies  Allergen Reactions  . Advil [Ibuprofen] Other (See Comments)    Dizziness   . Aleve [Naproxen] Other (See Comments)    Dizziness  . Fluticasone-Salmeterol Other (See Comments)    dizziness  . Lipitor [Atorvastatin] Other (See Comments)    Legs weak.  Leg pain, muscle ache  . Naproxen Sodium Other (See Comments)  . Penicillins Hives and Other (See Comments)    Has patient had a PCN reaction causing immediate rash, facial/tongue/throat swelling, SOB or lightheadedness with  hypotension: YES Has patient had a PCN reaction causing severe rash involving mucus membranes or skin necrosis: no Has patient had a PCN reaction that required hospitalization: no Has patient had a PCN reaction occurring within the last 10 years: no If all of the above answers are "NO", then may proceed with Cephalosporin use.     Current Medications: Current Outpatient Medications  Medication Sig Dispense Refill  . aspirin 81 MG tablet Take 81 mg by mouth daily.    . blood glucose meter kit and supplies KIT Dispense based on patient and insurance preference. Use up to four times daily as directed. One Touch Ultra mini meter (FOR ICD-9 250.00, 250.01). Dx: E11.9 1 each 0  . cholecalciferol (VITAMIN D) 25 MCG (1000 UT) tablet Take 1,000 Units by mouth daily.    . furosemide (LASIX) 20 MG tablet Take 1 tablet (20 mg total) by mouth daily. 90 tablet 3  . glimepiride (AMARYL) 2 MG tablet TAKE 1 TABLET (2 MG TOTAL) BY MOUTH 2 (TWO) TIMES DAILY. (Patient taking differently: Take 2 mg by mouth daily with breakfast. TAKE 1 TABLET (2 MG TOTAL) BY MOUTH 2 (TWO) TIMES DAILY.) 180 tablet 1  . glucose blood (ONE TOUCH ULTRA TEST) test strip USE TO TEST BLOOD SUGAR UP TO 4 TIMES DAILY 100 each 6  . LANTUS SOLOSTAR 100 UNIT/ML Solostar Pen INJECT 20 UNITS INTO THE SKIN DAILY AT 10 PM. (Patient taking differently: Inject 28 Units into the skin at bedtime. ) 18 pen 0  . letrozole (FEMARA) 2.5 MG tablet TAKE 1 TABLET BY MOUTH EVERY DAY 90 tablet 0  . loratadine-pseudoephedrine (CLARITIN-D 24-HOUR) 10-240 MG 24 hr tablet Take 1 tablet by mouth daily.    [name] losartan (COZAAR) 100 MG tablet TAKE 0.5 TABLETS (50 MG TOTAL) BY MOUTH 2 (TWO) TIMES DAILY. 90 tablet 0  . Multiple Vitamins-Minerals (PRESERVISION AREDS 2 PO) Take by mouth 2 (two) times daily.    . nitroGLYCERIN (NITROSTAT) 0.4 MG SL tablet Place 1 tablet (0.4 mg total) under the tongue every 5 (five) minutes as needed for chest pain. Maximum of 3 doses. 25  tablet 3  . NOVOFINE 32G X 6 MM MISC USE AS DIRECTED WITH LANTUS PEN 100 each 2  . propranolol ER (INDERAL LA) 60 MG 24 hr capsule Take 60 mg by mouth daily.    . rosuvastatin (  CRESTOR) 10 MG tablet Take 2 tablets (20 mg total) by mouth daily. 180 tablet 1  . TRULICITY 1.5 VZ/5.6LO SOPN Inject 1.5 mg as directed once a week.  11  . vitamin B-12 (CYANOCOBALAMIN) 1000 MCG tablet Take 1,000 mcg by mouth daily.    . benzonatate (TESSALON) 200 MG capsule Take 1 capsule (200 mg total) by mouth 2 (two) times daily as needed for cough. (Patient not taking: Reported on 12/24/2019) 20 capsule 0   No current facility-administered medications for this visit.   Physical Exam: Blood pressure 120/77, pulse 71, temperature (!) 97.4 F (36.3 C), resp. rate 18, weight 162 lb 3.2 oz (73.6 kg). Physical Exam Constitutional:      General: She is not in acute distress.    Appearance: She is not diaphoretic.  HENT:     Head: Normocephalic and atraumatic.     Nose: Nose normal.     Mouth/Throat:     Pharynx: No oropharyngeal exudate.  Eyes:     General: No scleral icterus.    Pupils: Pupils are equal, round, and reactive to light.  Cardiovascular:     Rate and Rhythm: Normal rate and regular rhythm.     Heart sounds: No murmur heard.   Pulmonary:     Effort: Pulmonary effort is normal. No respiratory distress.     Breath sounds: No rales.  Chest:     Chest wall: No tenderness.  Abdominal:     General: There is no distension.     Palpations: Abdomen is soft.     Tenderness: There is no abdominal tenderness.  Musculoskeletal:        General: Normal range of motion.     Cervical back: Normal range of motion and neck supple.  Skin:    General: Skin is warm and dry.     Findings: No erythema.  Neurological:     Mental Status: She is alert and oriented to person, place, and time.     Cranial Nerves: No cranial nerve deficit.     Motor: No abnormal muscle tone.     Coordination: Coordination normal.   Psychiatric:        Mood and Affect: Affect normal.   Patient declines breast examination.    Appointment on 12/24/2019  Component Date Value Ref Range Status  . Sodium 12/24/2019 135  135 - 145 mmol/L Final  . Potassium 12/24/2019 3.3* 3.5 - 5.1 mmol/L Final  . Chloride 12/24/2019 96* 98 - 111 mmol/L Final  . CO2 12/24/2019 27  22 - 32 mmol/L Final  . Glucose, Bld 12/24/2019 257* 70 - 99 mg/dL Final   Glucose reference range applies only to samples taken after fasting for at least 8 hours.  . BUN 12/24/2019 22  8 - 23 mg/dL Final  . Creatinine, Ser 12/24/2019 1.81* 0.44 - 1.00 mg/dL Final  . Calcium 12/24/2019 8.8* 8.9 - 10.3 mg/dL Final  . Total Protein 12/24/2019 6.8  6.5 - 8.1 g/dL Final  . Albumin 12/24/2019 4.1  3.5 - 5.0 g/dL Final  . AST 12/24/2019 14* 15 - 41 U/L Final  . ALT 12/24/2019 18  0 - 44 U/L Final  . Alkaline Phosphatase 12/24/2019 85  38 - 126 U/L Final  . Total Bilirubin 12/24/2019 0.7  0.3 - 1.2 mg/dL Final  . GFR calc non Af Amer 12/24/2019 26* >60 mL/min Final  . GFR calc Af Amer 12/24/2019 30* >60 mL/min Final  . Anion gap 12/24/2019 12  5 - 15  Final   Performed at [facility], [address] [place], [place] [address]  . WBC 12/24/2019 10.0  4.0 - 10.5 K/uL Final  . RBC 12/24/2019 3.82* 3.87 - 5.11 MIL/uL Final  . Hemoglobin 12/24/2019 11.5* 12.0 - 15.0 g/dL Final  . HCT 12/24/2019 33.1* 36 - 46 % Final  . MCV 12/24/2019 86.6  80.0 - 100.0 fL Final  . MCH 12/24/2019 30.1  26.0 - 34.0 pg Final  . MCHC 12/24/2019 34.7  30.0 - 36.0 g/dL Final  . RDW 12/24/2019 12.2  11.5 - 15.5 % Final  . Platelets 12/24/2019 197  150 - 400 K/uL Final  . nRBC 12/24/2019 0.0  0.0 - 0.2 % Final  . Neutrophils Relative % 12/24/2019 73  % Final  . Neutro Abs 12/24/2019 7.5  1.7 - 7.7 K/uL Final  . Lymphocytes Relative 12/24/2019 12  % Final  . Lymphs Abs 12/24/2019 1.2  0.7 - 4.0 K/uL Final  . Monocytes Relative 12/24/2019 6  % Final  . Monocytes Absolute  12/24/2019 0.6  0 - 1 K/uL Final  . Eosinophils Relative 12/24/2019 7  % Final  . Eosinophils Absolute 12/24/2019 0.7* 0 - 0 K/uL Final  . Basophils Relative 12/24/2019 1  % Final  . Basophils Absolute 12/24/2019 0.1  0 - 0 K/uL Final  . Immature Granulocytes 12/24/2019 1  % Final  . Abs Immature Granulocytes 12/24/2019 0.07  0.00 - 0.07 K/uL Final   Performed at [facility], [address] [place], [place] [address]   RADIOGRAPHIC STUDIES: I have personally reviewed the radiological images as listed and agreed with the findings in the report. DG Bone Density  Result Date: 12/20/2019 EXAM: DUAL X-RAY ABSORPTIOMETRY (DXA) FOR BONE MINERAL DENSITY IMPRESSION: Your patient Kelsey Mason completed a BMD test on 12/20/2019 using the [place] (software version: 14.10) manufactured by [employer]. The following summarizes the results of our evaluation. Technologist: MTB PATIENT BIOGRAPHICAL: Name: Kelsey Mason, Kelsey Mason Patient ID: [id number] Birth Date: December 07, 1940 Height: 62.0 in. Gender: Female Exam Date: 12/20/2019 Weight: 161.0 lbs. Indications: Advanced Age, Breast CA, Caucasian, Diabetic, Height Loss, Hysterectomy, Kidney Disease, Postmenopausal, Previous Chemo and Radiation Fractures: Treatments: Letrozole DENSITOMETRY RESULTS: Site         Region     Measured Date Measured Age WHO Classification Young Adult T-score BMD         %Change vs. Previous Significant Change (*) AP Spine L1-L2 12[age]20/[age] 78.7 Osteopenia -1.4 0.999 g/cm2 -2.6% - AP Spine L1-L2 10/18/2017 76.6 Osteopenia -1.2 1.026 g/cm2 - - DualFemur Neck Left 12/20/2019 78.7 Osteoporosis -2.5 0.690 g/cm2 -3.6% - DualFemur Neck Left 10/18/2017 76.6 Osteopenia -2.3 0.716 g/cm2 - - DualFemur Total Mean 12/20/2019 78.7 Osteopenia -2.2 0.726 g/cm2 -10.9% Yes DualFemur Total Mean 10/18/2017 76.6 Osteopenia -1.5 0.815 g/cm2 - - Left Forearm Radius 33% 12/20/2019 78.7 Osteopenia -1.7 0.725 g/cm2 -12.5% Yes Left  Forearm Radius 33% 10/18/2017 76.6 Normal -0.5 0.829 g/cm2 - - ASSESSMENT: The BMD measured at Femur Neck Left is 0.690 g/cm2 with a T-score of -2.5. This patient is considered osteoporotic according to [facility]) criteria The scan quality is good. L-3 and L-4 were excluded due to degenerative changes.Compared with prior study, there has been no significant change in the spine. Compared with prior study, there has been significant decrease in the total hip. World Health Organization [facility]) criteria for post-menopausal, Caucasian Women: Normal:  T-score at or above -1 SD Osteopenia/low bone mass: T-score between -1 and -2.5 SD Osteoporosis:             T-score at or below -2.5 SD RECOMMENDATIONS: 1. All patients should optimize calcium and vitamin D intake. 2. Consider FDA-approved medical therapies in postmenopausal women and men aged 78 years and older, based on the following: a. A hip or vertebral(clinical or morphometric) fracture b. T-score < -2.5 at the femoral neck or spine after appropriate evaluation to exclude secondary causes c. Low bone mass (T-score between -1.0 and -2.5 at the femoral neck or spine) and a 10-year probability of a hip fracture > 3% or a 10-year probability of a major osteoporosis-related fracture > 20% based on the [place]-adapted WHO algorithm 3. Clinician judgment and/or patient preferences may indicate treatment for people with 10-year fracture probabilities above or below these levels FOLLOW-UP: People with diagnosed cases of osteoporosis or at high risk for fracture should have regular bone mineral density tests. For patients eligible for Medicare, routine testing is allowed once every 2 years. The testing frequency can be increased to one year for patients who have rapidly progressing disease, those who are receiving or discontinuing medical therapy to restore bone mass, or have additional risk factors. I have reviewed this report, and agree with the  above findings. [facility] Electronically Signed   By: Marin Olp M.D.   On: 12/20/2019 16:47    Assessment:  Kelsey Mason is a 79 y.o. female presents for follow-up of bilateral stage I breast cancer.  1. History of breast cancer   2. Aromatase inhibitor use   3. Localized osteoporosis without current pathological fracture   4. Stage 4 chronic kidney disease ([place])   # History of breast cancer- 2014 Patient has been on letrozole 2.5 mg since March 2015.  She has completed more than 5 years of aromatase inhibitor treatments. Her BCI was obtained previously 10/04/2017 revealed a high risk of late recurrence (5.4%; CI 2.5%-8.2%) during years 5-10.  There was a low likelihood of benefit from extended endocrine therapy Previously decision was made to proceed with extended AI treatments given the high risk of late recurrence given that she is tolerating well with no significant side effects. Most recent bone density was reviewed with patient. Her T score was -2.3 in 2019 now left femoral neck T score at -2.5. She has progressed to osteoporosis state. I have previously discussed with patient about dental clearance and bisphosphonate use.  At that time, patient decided not to proceed with intervention due to COVID-19 pandemic Today I had a lengthy discussion with her. Discussed at length the risk factors [race, post-menopausal status, use of AI]; prevention/treatment strategies- including but not limited to exercise calcium and vitamin D twice a day. Recommend SQ prolia. Discussed the potential side effects including but not limited to- hypocalcemia and ONJ; discussed not to have invasive dental procedures few weeks around the time of the injections.   Given that she now has significantly elevated fracture risk due to aromatase inhibitor induced osteoporosis, low likelihood of benefit from extended endocrine therapy, I recommend patient to stop letrozole.  She agrees with the  plan. She is also due for annual screening mammogram bilaterally in November 2020.  Will obtain.  #CKD, she appears to have worsening of kidney function.  Recommend patient to avoid nephrotoxins, improve oral hydration.  Follow-up with nephrology..  She has mild anemia of hemoglobin 11.5.  Likely due to CKD.  Check  SPEP.  Light chain   RTC in 4 months MD assessment and labs   Earlie Server, MD, PhD Hematology Oncology [facility] Pager- [phone] 12/24/2019

## 2020-01-18 DIAGNOSIS — E1122 Type 2 diabetes mellitus with diabetic chronic kidney disease: Secondary | ICD-10-CM | POA: Diagnosis not present

## 2020-01-18 DIAGNOSIS — N1832 Chronic kidney disease, stage 3b: Secondary | ICD-10-CM | POA: Diagnosis not present

## 2020-01-25 DIAGNOSIS — N1832 Chronic kidney disease, stage 3b: Secondary | ICD-10-CM | POA: Diagnosis not present

## 2020-01-25 DIAGNOSIS — I10 Essential (primary) hypertension: Secondary | ICD-10-CM | POA: Diagnosis not present

## 2020-01-25 DIAGNOSIS — E1129 Type 2 diabetes mellitus with other diabetic kidney complication: Secondary | ICD-10-CM | POA: Diagnosis not present

## 2020-01-25 DIAGNOSIS — E1165 Type 2 diabetes mellitus with hyperglycemia: Secondary | ICD-10-CM | POA: Diagnosis not present

## 2020-01-25 DIAGNOSIS — Z794 Long term (current) use of insulin: Secondary | ICD-10-CM | POA: Diagnosis not present

## 2020-01-25 DIAGNOSIS — E1122 Type 2 diabetes mellitus with diabetic chronic kidney disease: Secondary | ICD-10-CM | POA: Diagnosis not present

## 2020-01-25 DIAGNOSIS — R809 Proteinuria, unspecified: Secondary | ICD-10-CM | POA: Diagnosis not present

## 2020-01-30 ENCOUNTER — Other Ambulatory Visit: Payer: Self-pay

## 2020-01-30 ENCOUNTER — Ambulatory Visit (INDEPENDENT_AMBULATORY_CARE_PROVIDER_SITE_OTHER): Payer: Medicare PPO

## 2020-01-30 DIAGNOSIS — I428 Other cardiomyopathies: Secondary | ICD-10-CM

## 2020-01-30 LAB — ECHOCARDIOGRAM COMPLETE
AR max vel: 1.87 cm2
AV Area VTI: 1.88 cm2
AV Area mean vel: 1.92 cm2
AV Mean grad: 3 mmHg
AV Peak grad: 5.6 mmHg
Ao pk vel: 1.18 m/s
Area-P 1/2: 4.65 cm2
S' Lateral: 3.4 cm
Single Plane A4C EF: 43.5 %

## 2020-02-05 NOTE — Progress Notes (Signed)
Follow-up Outpatient Visit Date: 02/06/2020  Primary Care Provider: Leone Haven, MD [address] [place] [address]  Chief Complaint: Follow-up coronary artery disease and cardiomyopathy  HPI:  Kelsey Mason is a 79 y.o. female with history of nonobstructive coronary artery disease, peripheral vascular disease with small vessel disease involving the runoff vessels, nonischemic cardiomyopathy, mitral regurgitation, hypertension, hyperlipidemia, type 2 diabetes mellitus, mild to moderate renal artery stenosis, breast cancer, who presents for follow-up of coronary artery disease and cardiomyopathy.  I last saw Kelsey Mason in late January, at which time she was doing well from a heart standpoint.  Back pain and generalized deconditioning were her greatest limiting factors.  Echocardiogram last month showed stable moderate reduction in left ventricular systolic function (EF 47%) with global hypokinesis and grade 1 diastolic dysfunction.  There was mild to moderate regurgitation.  Today, Kelsey Mason reports feeling relatively well other than continued low back pain.  She has been doing some exercises at home with modest improvement.  This is her primary limiting factor.  Despite this, she continues to do line dancing on a regular basis.  She has not had any chest pain, shortness of breath, edema, or claudication.  She feels dizzy on a regular basis but attributes this to some of her medications.  She continues to have some shaking in her hands, left greater than right, which she attributes to nerve injury from prior surgery.  She did not notice any significant improvement with transition from carvedilol to propranolol.  Home blood pressures have been in the 140-150/70-80 range.  --------------------------------------------------------------------------------------------------  Cardiovascular History & Procedures: Cardiovascular Problems:  Non-ischemic cardiomyopathy  Mitral  regurgitation  Claudication  Risk Factors:  HTN, HLD, DM2, and age > 70  Cath/PCI:  LHC (04/21/2018): LMCA normal. LAD with 50% mid vessel stenosis. LCx proper without disease. Dominant OM3 with 50-60% proximal stenosis. RCA with 50% ostial stenosis. Mildly elevated LVEDP (15 to 20 mmHg).  Abdominal aortogram and runoff (04/21/2018): Aorta, inflow vessels, and outflow vessels. There is tapering/occlusion of the distal anterior tibial and peroneal arteries on the right. There is three-vessel runoff on the left.  CV Surgery:  None  EP Procedures and Devices:  None  Non-Invasive Evaluation(s):  TTE (01/30/2020): Normal LV size with LVEF 40% and global hypokinesis.  Grade 1 diastolic dysfunction with elevated filling pressure.  Low normal RV function with normal size and wall thickness.  Mild left atrial enlargement.  Mild to moderate mitral regurgitation.  TTE (03/14/18): Mildly dilated LV with LVEF 40-45%. Global hypokinesis with grade 2 diastolic dysfunction. Moderate MR and mild LA enlargement. Normal RV size and function.  ABIs (02/22/2018): Noncompressible runoff vessels bilaterally with triphasic waveforms. TBI's are normal bilaterally.  Renal artery Doppler (04/20/17): Mild to moderate (less than 60%) stenosis of the right renal artery. No significant left renal artery disease.  TTE (01/14/17): Normal LV size with LVEF 50-55%. Anteroseptal hypokinesis is noted, as well as grade 1 diastolic dysfunction. Mild to moderate MR. Normal RV size and function. Mild pulmonary hypertension.  Pharmacologic myocardial perfusion stress test (06/16/16): Low risk study with moderate in size, moderate in severity, fixed defect involving the septum most likely related to LBBB. No evidence of ischemia. LVEF 50% by automated calculation and likely higher based on visual estimation.  ABIs (06/10/16): ABIs: Right not obtainable, left 1.4. TBIs right 1.1, left not obtainable. Bilateral  great toe PPG's are normal. Bilateral common femoral, popliteal, peroneal, anterior tibial, and posterior tibial artery waveforms are  brisk and triphasic.  TTE (02/23/16, [facility]): Mildly dilated left ventricle with mild to moderate LV dysfunction (EF 35-45%). Grade 1 diastolic dysfunction. Moderate mitral regurgitation. Mild left atrial enlargement. Normal RV function.  TTE (01/01/14, [facility]): Moderate to severe LV dysfunction (EF 30%) with mild LVH and mild left ventricular dilation. Mitral annular calcification with moderate MR and moderate TR noted. Normal right ventricular contraction.  MUGA (12/25/12): Normal contraction and wall motion. EF 63%.  Recent CV Pertinent Labs: Lab Results  Component Value Date   CHOL 151 11/06/2019   CHOL 156 08/01/2019   HDL 47.10 11/06/2019   HDL 45 08/01/2019   LDLCALC 73 11/06/2019   LDLCALC 88 08/01/2019   LDLDIRECT 178.7 10/02/2012   TRIG 154.0 (H) 11/06/2019   CHOLHDL 3 11/06/2019   K 3.3 (L) 12/24/2019   K 4.7 01/07/2014   BUN 22 12/24/2019   BUN 19 02/19/2019   BUN 19 (H) 01/07/2014   CREATININE 1.81 (H) 12/24/2019   CREATININE 1.24 01/07/2014    Past medical and surgical history were reviewed and updated in EPIC.  Current Meds  Medication Sig   aspirin 81 MG tablet Take 81 mg by mouth daily.   blood glucose meter kit and supplies KIT Dispense based on patient and insurance preference. Use up to four times daily as directed. One Touch Ultra mini meter (FOR ICD-9 250.00, 250.01). Dx: E11.9   cholecalciferol (VITAMIN D) 25 MCG (1000 UT) tablet Take 1,000 Units by mouth daily.   dapagliflozin propanediol (FARXIGA) 10 MG TABS tablet Take by mouth.   furosemide (LASIX) 20 MG tablet Take 1 tablet (20 mg total) by mouth daily.   glimepiride (AMARYL) 2 MG tablet Take 2 mg by mouth daily with breakfast.   glucose blood (ONE TOUCH ULTRA TEST) test strip USE TO TEST BLOOD SUGAR UP TO 4 TIMES DAILY   LANTUS SOLOSTAR 100  UNIT/ML Solostar Pen INJECT 20 UNITS INTO THE SKIN DAILY AT 10 PM. (Patient taking differently: Inject 28 Units into the skin at bedtime. )   loratadine-pseudoephedrine (CLARITIN-D 24-HOUR) 10-240 MG 24 hr tablet Take 1 tablet by mouth daily.   losartan (COZAAR) 100 MG tablet TAKE 0.5 TABLETS (50 MG TOTAL) BY MOUTH 2 (TWO) TIMES DAILY.   nitroGLYCERIN (NITROSTAT) 0.4 MG SL tablet Place 1 tablet (0.4 mg total) under the tongue every 5 (five) minutes as needed for chest pain. Maximum of 3 doses.   NOVOFINE 32G X 6 MM MISC USE AS DIRECTED WITH LANTUS PEN   propranolol ER (INDERAL LA) 60 MG 24 hr capsule Take 60 mg by mouth daily.   rosuvastatin (CRESTOR) 10 MG tablet Take 2 tablets (20 mg total) by mouth daily.   TRULICITY 1.5 NV/9.79YO SOPN Inject 1.5 mg as directed once a week.   vitamin B-12 (CYANOCOBALAMIN) 1000 MCG tablet Take 1,000 mcg by mouth daily.   [DISCONTINUED] glimepiride (AMARYL) 2 MG tablet TAKE 1 TABLET (2 MG TOTAL) BY MOUTH 2 (TWO) TIMES DAILY. (Patient taking differently: Take 2 mg by mouth daily with breakfast. TAKE 1 TABLET (2 MG TOTAL) BY MOUTH 2 (TWO) TIMES DAILY.)    Allergies: Advil [ibuprofen], Aleve [naproxen], Fluticasone-salmeterol, Lipitor [atorvastatin], Naproxen sodium, and Penicillins  Social History   Tobacco Use   Smoking status: Former Smoker    Packs/day: 0.25    Years: 1.00    Pack years: 0.25    Types: Cigarettes    Quit date: 1963    Years since quitting: 58.6   Smokeless tobacco: Never  Used  Vaping Use   Vaping Use: Never used  Substance Use Topics   Alcohol use: No    Alcohol/week: 0.0 standard drinks   Drug use: No    Family History  Problem Relation Age of Onset   Alcohol abuse Mother    Heart disease Mother    Hypertension Mother    Breast cancer Mother 77   Alcohol abuse Father    Heart disease Father    Hypertension Father    Heart disease Brother    Heart attack Brother     Review of Systems: A 12-system  review of systems was performed and was negative except as noted in the HPI.  --------------------------------------------------------------------------------------------------  Physical Exam: BP (!) 148/76 (BP Location: Left Arm, Patient Position: Sitting, Cuff Size: Normal)    Pulse 66    Ht '5\' 2"'  (1.575 m)    Wt 160 lb 6.4 oz (72.8 kg)    SpO2 93%    BMI 29.34 kg/m   General: NAD. Neck: No JVD or HJR. Lungs: Clear to auscultation without wheezes or crackles. Heart: Regular rate and rhythm without murmurs, rubs, or gallops. Abdomen: Soft, nontender, nondistended. Extremities: No lower extremity edema.  EKG: Normal sinus rhythm with left bundle branch block.  No significant change since 08/01/2019.  Lab Results  Component Value Date   WBC 10.0 12/24/2019   HGB 11.5 (L) 12/24/2019   HCT 33.1 (L) 12/24/2019   MCV 86.6 12/24/2019   PLT 197 12/24/2019    Lab Results  Component Value Date   NA 135 12/24/2019   K 3.3 (L) 12/24/2019   CL 96 (L) 12/24/2019   CO2 27 12/24/2019   BUN 22 12/24/2019   CREATININE 1.81 (H) 12/24/2019   GLUCOSE 257 (H) 12/24/2019   ALT 18 12/24/2019    Lab Results  Component Value Date   CHOL 151 11/06/2019   HDL 47.10 11/06/2019   LDLCALC 73 11/06/2019   LDLDIRECT 178.7 10/02/2012   TRIG 154.0 (H) 11/06/2019   CHOLHDL 3 11/06/2019    --------------------------------------------------------------------------------------------------  ASSESSMENT AND PLAN: Chronic HFrEF due to nonischemic cardiomyopathy: Kelsey Mason appears euvolemic on exam without significant heart failure symptoms. Functional capacity primarily limited by back pain. She was previously switched from carvedilol to propranolol to see if this would help improve her tremors; Kelsey Mason has not experienced any change. As such, we have agreed to transition back to carvedilol 12.5 mg twice daily in an effort to optimize her evidence-based heart failure therapy given persistent reduction  in LVEF.  Nonobstructive coronary artery disease: No symptoms to suggest worsening coronary insufficiency. Continue current medications for secondary prevention.  Chronic kidney disease: Recent chemistry notable for worsening creatinine, up to 1.8. Given that Kelsey Mason appears euvolemic on exam today, I have asked her to switch from furosemide 20 mg daily to as needed dosing for weight gain (2 pounds in 1 day or 5 pounds in 1 week) or edema. We will continue losartan unless renal function continues to worsen, given its benefits for her cardiomyopathy.  Hypertension: Blood pressure mildly elevated today. As above, we will transition from propranolol to carvedilol 12.5 mg twice daily. Kelsey Mason should continue losartan 100 mg daily.  Hyperlipidemia: LDL borderline at 73 on last check in May. We will continue with rosuvastatin 10 mg daily, given history of myalgias with more aggressive statin therapy. Lifestyle modifications encouraged.  Peripheral vascular disease: No significant claudication noted. Continue aspirin and statin therapy to prevent progression of disease.  Follow-up:  Return to clinic in 6 months.  Nelva Bush, MD 02/06/2020 9:20 AM

## 2020-02-06 ENCOUNTER — Other Ambulatory Visit: Payer: Self-pay

## 2020-02-06 ENCOUNTER — Ambulatory Visit: Payer: Medicare PPO | Admitting: Internal Medicine

## 2020-02-06 ENCOUNTER — Other Ambulatory Visit: Payer: Self-pay | Admitting: Internal Medicine

## 2020-02-06 ENCOUNTER — Encounter: Payer: Self-pay | Admitting: Internal Medicine

## 2020-02-06 VITALS — BP 148/76 | HR 66 | Ht 62.0 in | Wt 160.4 lb

## 2020-02-06 DIAGNOSIS — I739 Peripheral vascular disease, unspecified: Secondary | ICD-10-CM | POA: Diagnosis not present

## 2020-02-06 DIAGNOSIS — I10 Essential (primary) hypertension: Secondary | ICD-10-CM

## 2020-02-06 DIAGNOSIS — I5022 Chronic systolic (congestive) heart failure: Secondary | ICD-10-CM | POA: Diagnosis not present

## 2020-02-06 DIAGNOSIS — I2510 Atherosclerotic heart disease of native coronary artery without angina pectoris: Secondary | ICD-10-CM

## 2020-02-06 DIAGNOSIS — N183 Chronic kidney disease, stage 3 unspecified: Secondary | ICD-10-CM

## 2020-02-06 DIAGNOSIS — I428 Other cardiomyopathies: Secondary | ICD-10-CM | POA: Diagnosis not present

## 2020-02-06 DIAGNOSIS — E785 Hyperlipidemia, unspecified: Secondary | ICD-10-CM | POA: Diagnosis not present

## 2020-02-06 MED ORDER — FUROSEMIDE 20 MG PO TABS: 20.0000 mg | ORAL_TABLET | Freq: Every day | ORAL | 6 refills | Status: DC | PRN

## 2020-02-06 MED ORDER — CARVEDILOL 12.5 MG PO TABS: 12.5000 mg | ORAL_TABLET | Freq: Two times a day (BID) | ORAL | 2 refills | Status: DC

## 2020-02-06 NOTE — Telephone Encounter (Signed)
Please advise if ok to refill last filled by PCP. 

## 2020-02-06 NOTE — Patient Instructions (Signed)
Medication Instructions:  Your physician has recommended you make the following change in your medication:  1- STOP Propranolol. 2- START Carvedilol 12.5 mg (1 tablet) by mouth two times a day. 3- CHANGE Furosemide to 20 mg by mouth daily AS NEEDED for swelling greater than 2 lb in 24 hours, 5 lb in 1 week or swelling.  *If you need a refill on your cardiac medications before your next appointment, please call your pharmacy*  Follow-Up: At [facility], you and your health needs are our priority.  As part of our continuing mission to provide you with exceptional heart care, we have created designated Provider Care Teams.  These Care Teams include your primary Cardiologist (physician) and Advanced Practice Providers (APPs -  Physician Assistants and Nurse Practitioners) who all work together to provide you with the care you need, when you need it.  We recommend signing up for the patient portal called "MyChart".  Sign up information is provided on this After Visit Summary.  MyChart is used to connect with patients for Virtual Visits (Telemedicine).  Patients are able to view lab/test results, encounter notes, upcoming appointments, etc.  Non-urgent messages can be sent to your provider as well.   To learn more about what you can do with MyChart, go to [email or web address].    Your next appointment:   6 month(s)  The format for your next appointment:   In Person  Provider:    You may see Nelva Bush, MD or one of the following Advanced Practice Providers on your designated Care Team:    Murray Hodgkins, NP  Christell Faith, PA-C  Marrianne Mood, PA-C

## 2020-02-20 DIAGNOSIS — R600 Localized edema: Secondary | ICD-10-CM | POA: Insufficient documentation

## 2020-02-26 DIAGNOSIS — I10 Essential (primary) hypertension: Secondary | ICD-10-CM | POA: Diagnosis not present

## 2020-02-26 DIAGNOSIS — E1129 Type 2 diabetes mellitus with other diabetic kidney complication: Secondary | ICD-10-CM | POA: Diagnosis not present

## 2020-02-26 DIAGNOSIS — N1832 Chronic kidney disease, stage 3b: Secondary | ICD-10-CM | POA: Diagnosis not present

## 2020-03-19 ENCOUNTER — Telehealth (INDEPENDENT_AMBULATORY_CARE_PROVIDER_SITE_OTHER): Payer: Medicare PPO | Admitting: Family Medicine

## 2020-03-19 ENCOUNTER — Encounter: Payer: Self-pay | Admitting: Family Medicine

## 2020-03-19 DIAGNOSIS — N1832 Chronic kidney disease, stage 3b: Secondary | ICD-10-CM

## 2020-03-19 DIAGNOSIS — I10 Essential (primary) hypertension: Secondary | ICD-10-CM | POA: Diagnosis not present

## 2020-03-19 DIAGNOSIS — Z794 Long term (current) use of insulin: Secondary | ICD-10-CM | POA: Diagnosis not present

## 2020-03-19 DIAGNOSIS — E1121 Type 2 diabetes mellitus with diabetic nephropathy: Secondary | ICD-10-CM

## 2020-03-19 DIAGNOSIS — E785 Hyperlipidemia, unspecified: Secondary | ICD-10-CM | POA: Diagnosis not present

## 2020-03-19 NOTE — Assessment & Plan Note (Addendum)
Continue Crestor 10 mg once daily.  Check LDL.  Check CK given calf cramps.

## 2020-03-19 NOTE — Assessment & Plan Note (Signed)
Seems to be very well controlled.  She has an upcoming appointment with endocrinology.  They will check her A1c at that time.  She will continue Lantus 40 units once daily, glimepiride 2 mg once daily, Farxiga 10 mg once daily, and Trulicity 1.5 mg once weekly.

## 2020-03-19 NOTE — Assessment & Plan Note (Signed)
Decent control for age.  She will continue carvedilol 12.5 mg twice daily, Farxiga 10 mg once daily, Lasix 20 mg daily as needed, and losartan 50 mg twice daily.  Lab work as outlined below.

## 2020-03-19 NOTE — Progress Notes (Signed)
Virtual Visit via telephone Note  This visit type was conducted due to national recommendations for restrictions regarding the COVID-19 pandemic (e.g. social distancing).  This format is felt to be most appropriate for this patient at this time.  All issues noted in this document were discussed and addressed.  No physical exam was performed (except for noted visual exam findings with Video Visits).   I connected with Kelsey Mason today at  9:30 AM EDT by telephone and verified that I am speaking with the correct person using two identifiers. Location patient: home Location provider: work Persons participating in the virtual visit: patient, provider  I discussed the limitations, risks, security and privacy concerns of performing an evaluation and management service by telephone and the availability of in person appointments. I also discussed with the patient that there may be a patient responsible charge related to this service. The patient expressed understanding and agreed to proceed.  Interactive audio and video telecommunications were attempted between this provider and patient, however failed, due to patient having technical difficulties OR patient did not have access to video capability.  We continued and completed visit with audio only.   Reason for visit: f/u.  HPI: HYPERTENSION  Disease Monitoring  Home BP Monitoring 143/74 today, though often times lower Chest pain- no    Dyspnea- no Medications  Compliance-  Taking carvedilol, Lasix, losartan, and farxiga  Edema-no  DIABETES Disease Monitoring: Blood Sugar ranges-70-90 polyuria/phagia/dipsia-no      Optho-up-to-date Medications: Compliance-taking Lantus 40 units once daily, glimepiride 2 mg once daily, fark [place]ica, and Trulicity 1.5 mg once weekly hypoglycemic symptoms-no  HYPERLIPIDEMIA Symptoms Chest pain on exertion: No  Medications: Compliance-taking Crestor right upper quadrant pain-very rarely has a sharp pain  that lasts briefly and resolves on its own. Muscle aches-notes some discomfort in her calves when she dances though no discomfort at any other time.  Feels like a vice grip.    ROS: See pertinent positives and negatives per HPI.  Past Medical History:  Diagnosis Date  . Breast symptom 2014  . Cancer [facility]) 2014   Bilateral Breast, chemo Dr. Jeb Levering  . Diabetes mellitus   . GERD (gastroesophageal reflux disease)    mild  . Hypertension   . Personal history of chemotherapy   . Personal history of radiation therapy 2014   bilat lumpectomy    Past Surgical History:  Procedure Laterality Date  . ABDOMINAL AORTOGRAM W/LOWER EXTREMITY N/A 04/21/2018   Procedure: ABDOMINAL AORTOGRAM W/LOWER EXTREMITY;  Surgeon: Nelva Bush, MD;  Location: [place] [facility];  Service: Cardiovascular;  Laterality: N/A;  . ABDOMINAL HYSTERECTOMY  1976   for pelvic pain  . BREAST EXCISIONAL BIOPSY Bilateral 11/2012   bilat breast ca chemo and rad  . BREAST SURGERY Bilateral 2014   bilateral lumpectomy and SN biopsy  . CHOLECYSTECTOMY  2007  . COLONOSCOPY WITH PROPOFOL N/A 10/13/2015   Procedure: COLONOSCOPY WITH PROPOFOL;  Surgeon: Manya Silvas, MD;  Location: [facility] ENDOSCOPY;  Service: Endoscopy;  Laterality: N/A;  . LEFT HEART CATH AND CORONARY ANGIOGRAPHY N/A 04/21/2018   Procedure: LEFT HEART CATH AND CORONARY ANGIOGRAPHY;  Surgeon: Nelva Bush, MD;  Location: [place] [facility];  Service: Cardiovascular;  Laterality: N/A;  . OOPHORECTOMY Right   . PORTACATH PLACEMENT  2014  . TUMOR REMOVAL Right 1980   right foot    Family History  Problem Relation Age of Onset  . Alcohol abuse Mother   . Heart disease Mother   .  Hypertension Mother   . Breast cancer Mother 68  . Alcohol abuse Father   . Heart disease Father   . Hypertension Father   . Heart disease Brother   . Heart attack Brother     SOCIAL HX: Former smoker   Current Outpatient Medications:  .  aspirin 81 MG tablet,  Take 81 mg by mouth daily., Disp: , Rfl:  .  blood glucose meter kit and supplies KIT, Dispense based on patient and insurance preference. Use up to four times daily as directed. One Touch Ultra mini meter (FOR ICD-9 250.00, 250.01). Dx: E11.9, Disp: 1 each, Rfl: 0 .  carvedilol (COREG) 12.5 MG tablet, Take 1 tablet (12.5 mg total) by mouth 2 (two) times daily., Disp: 180 tablet, Rfl: 2 .  cholecalciferol (VITAMIN D) 25 MCG (1000 UT) tablet, Take 1,000 Units by mouth daily., Disp: , Rfl:  .  dapagliflozin propanediol (FARXIGA) 10 MG TABS tablet, Take by mouth., Disp: , Rfl:  .  furosemide (LASIX) 20 MG tablet, Take 1 tablet (20 mg total) by mouth daily as needed (for >2lb weight gain overnight or 5 lb weight gain in 1 week or swelling.)., Disp: 30 tablet, Rfl: 6 .  glimepiride (AMARYL) 2 MG tablet, Take 2 mg by mouth daily with breakfast., Disp: , Rfl:  .  glucose blood (ONE TOUCH ULTRA TEST) test strip, USE TO TEST BLOOD SUGAR UP TO 4 TIMES DAILY, Disp: 100 each, Rfl: 6 .  LANTUS SOLOSTAR 100 UNIT/ML Solostar Pen, INJECT 20 UNITS INTO THE SKIN DAILY AT 10 PM. (Patient taking differently: Inject 40 Units into the skin at bedtime. ), Disp: 18 pen, Rfl: 0 .  loratadine-pseudoephedrine (CLARITIN-D 24-HOUR) 10-240 MG 24 hr tablet, Take 1 tablet by mouth daily., Disp: , Rfl:  .  nitroGLYCERIN (NITROSTAT) 0.4 MG SL tablet, Place 1 tablet (0.4 mg total) under the tongue every 5 (five) minutes as needed for chest pain. Maximum of 3 doses., Disp: 25 tablet, Rfl: 3 .  NOVOFINE 32G X 6 MM MISC, USE AS DIRECTED WITH LANTUS PEN, Disp: 100 each, Rfl: 2 .  rosuvastatin (CRESTOR) 10 MG tablet, TAKE 1 TABLET BY MOUTH EVERY DAY, Disp: 90 tablet, Rfl: 2 .  TRULICITY 1.5 PT/4.6FK SOPN, Inject 1.5 mg as directed once a week., Disp: , Rfl: 11 .  vitamin B-12 (CYANOCOBALAMIN) 1000 MCG tablet, Take 1,000 mcg by mouth daily., Disp: , Rfl:  .  losartan (COZAAR) 100 MG tablet, TAKE 0.5 TABLETS (50 MG TOTAL) BY MOUTH 2 (TWO)  TIMES DAILY. (Patient not taking: Reported on 03/19/2020), Disp: 90 tablet, Rfl: 0  EXAM: This is a telephone visit and thus no physical exam was completed.  ASSESSMENT AND PLAN:  Discussed the following assessment and plan:  Essential hypertension Decent control for age.  She will continue carvedilol 12.5 mg twice daily, Farxiga 10 mg once daily, Lasix 20 mg daily as needed, and losartan 50 mg twice daily.  Lab work as outlined below.  Type 2 diabetes mellitus with stage 3 chronic kidney disease, with long-term current use of insulin (HCC) Seems to be very well controlled.  She has an upcoming appointment with endocrinology.  They will check her A1c at that time.  She will continue Lantus 40 units once daily, glimepiride 2 mg once daily, Farxiga 10 mg once daily, and Trulicity 1.5 mg once weekly.  Hyperlipidemia LDL goal <70 Continue Crestor 10 mg once daily.  Check LDL.  Check CK given calf cramps.   Orders Placed This  Encounter  Procedures  . LDL cholesterol, direct    Standing Status:   Future    Standing Expiration Date:   03/19/2021  . Comp Met (CMET)    Standing Status:   Future    Standing Expiration Date:   03/19/2021  . CK (Creatine Kinase)    Standing Status:   Future    Standing Expiration Date:   03/19/2021    No orders of the defined types were placed in this encounter.    I discussed the assessment and treatment plan with the patient. The patient was provided an opportunity to ask questions and all were answered. The patient agreed with the plan and demonstrated an understanding of the instructions.   The patient was advised to call back or seek an in-person evaluation if the symptoms worsen or if the condition fails to improve as anticipated.  I provided 11 minutes of non-face-to-face time during this encounter.   [name], MD

## 2020-03-20 ENCOUNTER — Other Ambulatory Visit (INDEPENDENT_AMBULATORY_CARE_PROVIDER_SITE_OTHER): Payer: Medicare PPO

## 2020-03-20 ENCOUNTER — Other Ambulatory Visit: Payer: Self-pay

## 2020-03-20 DIAGNOSIS — E785 Hyperlipidemia, unspecified: Secondary | ICD-10-CM | POA: Diagnosis not present

## 2020-03-20 LAB — COMPREHENSIVE METABOLIC PANEL
ALT: 15 U/L (ref 0–35)
AST: 14 U/L (ref 0–37)
Albumin: 4.4 g/dL (ref 3.5–5.2)
Alkaline Phosphatase: 81 U/L (ref 39–117)
BUN: 17 mg/dL (ref 6–23)
CO2: 28 mEq/L (ref 19–32)
Calcium: 9.3 mg/dL (ref 8.4–10.5)
Chloride: 101 mEq/L (ref 96–112)
Creatinine, Ser: 1.29 mg/dL — ABNORMAL HIGH (ref 0.40–1.20)
GFR: 39.86 mL/min — ABNORMAL LOW (ref >60.00)
Glucose, Bld: 121 mg/dL — ABNORMAL HIGH (ref 70–99)
Potassium: 4.5 mEq/L (ref 3.5–5.1)
Sodium: 135 mEq/L (ref 135–145)
Total Bilirubin: 0.7 mg/dL (ref 0.2–1.2)
Total Protein: 6.3 g/dL (ref 6.0–8.3)

## 2020-03-20 LAB — LDL CHOLESTEROL, DIRECT: Direct LDL: 71 mg/dL

## 2020-03-20 LAB — CK: Total CK: 45 U/L (ref 7–177)

## 2020-03-28 ENCOUNTER — Other Ambulatory Visit: Payer: Self-pay | Admitting: *Deleted

## 2020-03-28 MED ORDER — ROSUVASTATIN CALCIUM 20 MG PO TABS
20.0000 mg | ORAL_TABLET | Freq: Every day | ORAL | 3 refills | Status: DC
Start: 2020-03-28 — End: 2021-09-02

## 2020-03-29 ENCOUNTER — Other Ambulatory Visit: Payer: Self-pay | Admitting: Internal Medicine

## 2020-04-17 ENCOUNTER — Ambulatory Visit (INDEPENDENT_AMBULATORY_CARE_PROVIDER_SITE_OTHER): Payer: Medicare PPO

## 2020-04-17 VITALS — Ht 62.5 in | Wt 158.2 lb

## 2020-04-17 DIAGNOSIS — Z0000 Encounter for general adult medical examination without abnormal findings: Secondary | ICD-10-CM

## 2020-04-17 NOTE — Progress Notes (Addendum)
Subjective:   Kelsey Mason is a 79 y.o. female who presents for Medicare Annual (Subsequent) preventive examination.  Review of Systems    No ROS.  Medicare Wellness Virtual Visit.    Cardiac Risk Factors include: advanced age (>19mn, >>5women);hypertension;diabetes mellitus     Objective:    Today's Vitals   04/17/20 1332  Weight: 158 lb 3.2 oz (71.8 kg)  Height: 5' 2.5" (1.588 m)   Body mass index is 28.47 kg/m.  Advanced Directives 04/17/2020 12/24/2019 06/19/2019 04/17/2019 03/22/2019 09/18/2018 04/21/2018  Does Patient Have a Medical Advance Directive? Yes Yes Yes Yes Yes Yes Yes  Type of AParamed[place]of [place] will [place] will Living will;Healthcare Power of Attorney Living will;Healthcare Power of ACape RoyaleLiving will Living will;Healthcare Power of ACaptain CookLiving will  Does patient want to make changes to medical advance directive? No - Patient declined - - No - Patient declined No - Patient declined - No - Patient declined  Copy of HScrevenin Chart? Yes - validated most recent copy scanned in chart (See row information) Yes - validated most recent copy scanned in chart (See row information) Yes - validated most recent copy scanned in chart (See row information) Yes - validated most recent copy scanned in chart (See row information) No - copy requested Yes - validated most recent copy scanned in chart (See row information) Yes  Would patient like information on creating a medical advance directive? - - - - - - -    Current Medications (verified) Outpatient Encounter Medications as of 04/17/2020  Medication Sig  . aspirin 81 MG tablet Take 81 mg by mouth daily.  . blood glucose meter kit and supplies KIT Dispense based on patient and insurance preference. Use up to four times daily as directed. One Touch Ultra mini meter (FOR  ICD-9 250.00, 250.01). Dx: E11.9  . carvedilol (COREG) 12.5 MG tablet Take 1 tablet (12.5 mg total) by mouth 2 (two) times daily.  . cholecalciferol (VITAMIN D) 25 MCG (1000 UT) tablet Take 1,000 Units by mouth daily.  . dapagliflozin propanediol (FARXIGA) 10 MG TABS tablet Take by mouth.  . furosemide (LASIX) 20 MG tablet Take 1 tablet (20 mg total) by mouth daily as needed (for >2lb weight gain overnight or 5 lb weight gain in 1 week or swelling.).  .Marland Kitchenglimepiride (AMARYL) 2 MG tablet Take 2 mg by mouth daily with breakfast.  . glucose blood (ONE TOUCH ULTRA TEST) test strip USE TO TEST BLOOD SUGAR UP TO 4 TIMES DAILY  . LANTUS SOLOSTAR 100 UNIT/ML Solostar Pen INJECT 20 UNITS INTO THE SKIN DAILY AT 10 PM. (Patient taking differently: Inject 40 Units into the skin at bedtime. )  . loratadine-pseudoephedrine (CLARITIN-D 24-HOUR) 10-240 MG 24 hr tablet Take 1 tablet by mouth daily.  .Marland Kitchenlosartan (COZAAR) 100 MG tablet TAKE 0.5 TABLETS (50 MG TOTAL) BY MOUTH 2 (TWO) TIMES DAILY.  . nitroGLYCERIN (NITROSTAT) 0.4 MG SL tablet Place 1 tablet (0.4 mg total) under the tongue every 5 (five) minutes as needed for chest pain. Maximum of 3 doses.  .Marland KitchenNOVOFINE 32G X 6 MM MISC USE AS DIRECTED WITH LANTUS PEN  . rosuvastatin (CRESTOR) 20 MG tablet Take 1 tablet (20 mg total) by mouth daily.  . TRULICITY 1.5 MQQ/2.2LNSOPN Inject 1.5 mg as directed once a week.  . vitamin B-12 (CYANOCOBALAMIN) 1000 MCG tablet Take 1,000 mcg by mouth  daily.   No facility-administered encounter medications on file as of 04/17/2020.    Allergies (verified) Advil [ibuprofen], Aleve [naproxen], Fluticasone-salmeterol, Lipitor [atorvastatin], Naproxen sodium, and Penicillins   History: Past Medical History:  Diagnosis Date  . Breast symptom 2014  . Cancer [facility]) 2014   Bilateral Breast, chemo Dr. Jeb Levering  . Diabetes mellitus   . GERD (gastroesophageal reflux disease)    mild  . Hypertension   . Personal history of chemotherapy    . Personal history of radiation therapy 2014   bilat lumpectomy   Past Surgical History:  Procedure Laterality Date  . ABDOMINAL AORTOGRAM W/LOWER EXTREMITY N/A 04/21/2018   Procedure: ABDOMINAL AORTOGRAM W/LOWER EXTREMITY;  Surgeon: Nelva Bush, MD;  Location: [facility];  Service: Cardiovascular;  Laterality: N/A;  . ABDOMINAL HYSTERECTOMY  1976   for pelvic pain  . BREAST EXCISIONAL BIOPSY Bilateral 11/2012   bilat breast ca chemo and rad  . BREAST SURGERY Bilateral 2014   bilateral lumpectomy and SN biopsy  . CHOLECYSTECTOMY  2007  . COLONOSCOPY WITH PROPOFOL N/A 10/13/2015   Procedure: COLONOSCOPY WITH PROPOFOL;  Surgeon: Manya Silvas, MD;  Location[facility] ENDOS[facility];  Service: Endoscopy;  Laterality: N/A;  . LEFT HEART CATH AND CORONARY ANGIOGRAPHY N/A 04/21/2018   Procedure: LEFT HEART CATH AND CORONARY ANGIOGRAPHY;  Surgeon: Nelva Bush, MD;  Location: [facility];  Service: Cardiovascular;  Laterality: N/A;  . OOPHORECTOMY Right   . PORTACATH PLACEMENT  2014  . TUMOR REMOVAL Right 1980   right foot   Family History  Problem Relation Age of Onset  . Alcohol abuse Mother   . Heart disease Mother   . Hypertension Mother   . Breast cancer Mother 27  . Alcohol abuse Father   . Heart disease Father   . Hypertension Father   . Heart disease Brother   . Heart attack Brother    Social History   Socioeconomic History  . Marital status: Widowed    Spouse name: Not on file  . Number of children: Not on file  . Years of education: Not on file  . Highest education level: Not on file  Occupational History  . Not on file  Tobacco Use  . Smoking status: Former Smoker    Packs/day: 0.25    Years: 1.00    Pack years: 0.25    Types: Cigarettes    Quit date: 1963    Years since quitting: 58.8  . Smokeless tobacco: Never Used  Vaping Use  . Vaping Use: Never used  Substance and Sexual Activity  . Alcohol use: No    Alcohol/week: 0.0 standard  drinks  . Drug use: No  . Sexual activity: Never  Other Topics Concern  . Not on file  Social History Narrative   Lives in [place].       Works - [employer]   Diet - regular   Exercise - dance 3x per week   Social Determinants of Health   Financial Resource Strain: Low Risk   . Difficulty of Paying Living Expenses: Not hard at all  Food Insecurity: No Food Insecurity  . Worried About Charity fundraiser in the Last Year: Never true  . Ran Out of Food in the Last Year: Never true  Transportation Needs: No Transportation Needs  . Lack of Transportation (Medical): No  . Lack of Transportation (Non-Medical): No  Physical Activity:   . Days of Exercise per Week: Not on file  .  Minutes of Exercise per Session: Not on file  Stress: No Stress Concern Present  . Feeling of Stress : Not at all  Social Connections:   . Frequency of Communication with Friends and Family: Not on file  . Frequency of Social Gatherings with Friends and Family: Not on file  . Attends Religious Services: Not on file  . Active Member of Clubs or Organizations: Not on file  . Attends Archivist Meetings: Not on file  . Marital Status: Not on file    Tobacco Counseling Counseling given: Not Answered   Clinical Intake:  Pre-visit preparation completed: Yes        Diabetes: Yes (Followed by Endocrinology, Dr. Gabriel Carina)  How often do you need to have someone help you when you read instructions, pamphlets, or other written materials from your doctor or pharmacy?: 1 - Never   Interpreter Needed?: No      Activities of Daily Living In your present state of health, do you have any difficulty performing the following activities: 04/17/2020  Hearing? N  Vision? N  Difficulty concentrating or making decisions? N  Walking or climbing stairs? N  Dressing or bathing? N  Doing errands, shopping? N  Preparing Food and eating ? N  Using the Toilet? N  In the past six months, have you  accidently leaked urine? N  Do you have problems with loss of bowel control? N  Managing your Medications? N  Managing your Finances? N  Housekeeping or managing your Housekeeping? N  Some recent data might be hidden    Patient Care Team: Leone Haven, MD as PCP - General (Family Medicine) End, Harrell Gave, MD as PCP - Cardiology (Cardiology) Christene Lye, MD as Consulting Physician (General Surgery) Gabriel Carina Betsey Holiday, MD as Physician Assistant (Endocrinology)  Indicate any recent Medical Services you may have received from other than [facility] providers in the past year (date may be approximate).     Assessment:   This is a routine wellness examination for Anastasya.  I connected with Jya today by telephone and verified that I am speaking with the correct person using two identifiers. Location patient: home Location provider: work Persons participating in the virtual visit: patient, Marine scientist.    I discussed the limitations, risks, security and privacy concerns of performing an evaluation and management service by telephone and the availability of in person appointments. The patient expressed understanding and verbally consented to this telephonic visit.    Interactive audio and video telecommunications were attempted between this provider and patient, however failed, due to patient having technical difficulties OR patient did not have access to video capability.  We continued and completed visit with audio only.  Some vital signs may be absent or patient reported.   Hearing/Vision screen  Hearing Screening   '125Hz'  '250Hz'  '500Hz'  '1000Hz'  '2000Hz'  '3000Hz'  '4000Hz'  '6000Hz'  '8000Hz'   Right ear:           Left ear:           Comments: Patient is able to hear conversational tones without difficulty. No issues reported.  Vision Screening Comments: Visual acuity not assessed, virtual visit. They have seen their ophthalmologist in the last 12 months.  Dietary issues and exercise  activities discussed: Current Exercise Habits: Home exercise routine, Type of exercise: calisthenics (dancing), Time (Minutes): > 60, Frequency (Times/Week): 1, Weekly Exercise (Minutes/Week): 0, Intensity: Mild  Goals    . Maintain Healthy Lifestyle     Stay active Stay hydrated Healthy diet  Depression Screen PHQ 2/9 Scores 04/17/2020 11/12/2019 04/17/2019 03/02/2019 04/11/2018 09/07/2017 04/08/2017  PHQ - 2 Score 0 0 0 0 0 0 0  PHQ- 9 Score - - - - - - 0    Fall Risk Fall Risk  04/17/2020 11/12/2019 04/17/2019 03/02/2019 04/11/2018  Falls in the past year? 0 0 0 0 No  Number falls in past yr: 0 0 - 0 -  Injury with Fall? - - - 0 -  Follow up Falls evaluation completed Falls evaluation completed - Falls evaluation completed -   Handrails in use when climbing stairs? Yes Home free of loose throw rugs in walkways, pet beds, electrical cords, etc? Yes  Adequate lighting in your home to reduce risk of falls? Yes   ASSISTIVE DEVICES UTILIZED TO PREVENT FALLS: Use of a cane, walker or w/c? No   TIMED UP AND GO: Was the test performed? No . Virtual visit.    Cognitive Function: Patient is alert and oriented x3.  Denies difficulty focusing, making decisions, recall.   MMSE - Mini Mental State Exam 04/11/2018 04/08/2017 01/30/2016  Orientation to time '3 3 5  ' Orientation to time comments Asked to draw a clock face of time 11:10. Time shown 11:50. difficulty reading the face of a clock correctly -  Orientation to Place '5 5 5  ' Registration '3 3 3  ' Attention/ Calculation '3 3 5  ' Attention/Calculation-comments Asked to subtract by 3's Difficulty performing simple calculations -  Recall '3 3 3  ' Language- name 2 objects '2 2 2  ' Language- repeat '1 1 1  ' Language- follow 3 step command '3 3 3  ' Language- read & follow direction '1 1 1  ' Write a sentence '1 1 1  ' Copy design '1 1 1  ' Total score '26 26 30     ' 6CIT Screen 04/17/2019  What Year? 0 points  What month? 0 points  What time? 0 points    Count back from 20 0 points  Months in reverse 0 points  Repeat phrase 0 points  Total Score 0    Immunizations Immunization History  Administered Date(s) Administered  . Fluad Quad(high Dose 65+) 04/26/2019  . Influenza Whole 03/26/2012, 04/10/2013  . Influenza, High Dose Seasonal PF 06/03/2016, 03/24/2017, 04/11/2018, 03/06/2020  . Influenza,inj,Quad PF,6+ Mos 05/15/2014, 03/25/2015  . Influenza-Unspecified 05/15/2014, 03/25/2015, 04/26/2019  . PFIZER SARS-COV-2 Vaccination 08/28/2019, 09/18/2019  . Pneumococcal Conjugate-13 02/05/2019    Health Maintenance Health Maintenance  Topic Date Due  . Hepatitis C Screening  Never done  . FOOT EXAM  04/01/2020  . TETANUS/TDAP  12/28/2020 (Originally 03/13/1960)  . HEMOGLOBIN A1C  05/08/2020  . OPHTHALMOLOGY EXAM  05/23/2020  . INFLUENZA VACCINE  Completed  . DEXA SCAN  Completed  . COVID-19 Vaccine  Completed  . PNA vac Low Risk Adult  Discontinued   Dental Screening: Recommended annual dental exams for proper oral hygiene  Community Resource Referral / Chronic Care Management: CRR required this visit?  No   CCM required this visit?  No      Plan:   Keep all routine maintenance appointments.   Next scheduled lab 05/08/20 @ 11:30  Follow up 1/18/1 @ 10:00   I have personally reviewed and noted the following in the patient's chart:   . Medical and social history . Use of alcohol, tobacco or illicit drugs  . Current medications and supplements . Functional ability and status . Nutritional status . Physical activity . Advanced directives . List of other physicians . Hospitalizations, surgeries,  and ER visits in previous 12 months . Vitals . Screenings to include cognitive, depression, and falls . Referrals and appointments  In addition, I have reviewed and discussed with patient certain preventive protocols, quality metrics, and best practice recommendations. A written personalized care plan for preventive services  as well as general preventive health recommendations were provided to patient via mychart.     Varney Biles, LPN   [phone]

## 2020-04-17 NOTE — Patient Instructions (Addendum)
Kelsey Mason , Thank you for taking time to come for your Medicare Wellness Visit. I appreciate your ongoing commitment to your health goals. Please review the following plan we discussed and let me know if I can assist you in the future.   These are the goals we discussed: Goals    . Maintain Healthy Lifestyle     Stay active Stay hydrated Healthy diet       This is a list of the screening recommended for you and due dates:  Health Maintenance  Topic Date Due  .  Hepatitis C: One time screening is recommended by Center for Disease Control  (CDC) for  adults born from [birth year] through 1965.   Never done  . Complete foot exam   04/01/2020  . Tetanus Vaccine  12/28/2020*  . Hemoglobin A1C  05/08/2020  . Eye exam for diabetics  05/23/2020  . Flu Shot  Completed  . DEXA scan (bone density measurement)  Completed  . COVID-19 Vaccine  Completed  . Pneumonia vaccines  Discontinued  *Topic was postponed. The date shown is not the original due date.   Immunizations Immunization History  Administered Date(s) Administered  . Fluad Quad(high Dose 65+) 04/26/2019  . Influenza Whole 03/26/2012, 04/10/2013  . Influenza, High Dose Seasonal PF 06/03/2016, 03/24/2017, 04/11/2018, 03/06/2020  . Influenza,inj,Quad PF,6+ Mos 05/15/2014, 03/25/2015  . Influenza-Unspecified 05/15/2014, 03/25/2015, 04/26/2019  . PFIZER SARS-COV-2 Vaccination 08/28/2019, 09/18/2019  . Pneumococcal Conjugate-13 02/05/2019   Keep all routine maintenance appointments.   Next scheduled lab 05/08/20 @ 11:30  Follow up 1/18/1 @ 10:00  Advanced directives: yes, completed  Conditions/risks identified: none new  Follow up in one year for your annual wellness visit.   Preventive Care 94 Years and Older, Female Preventive care refers to lifestyle choices and visits with your health care provider that can promote health and wellness. What does preventive care include?  A yearly physical exam. This is also called an  annual well check.  Dental exams once or twice a year.  Routine eye exams. Ask your health care provider how often you should have your eyes checked.  Personal lifestyle choices, including:  Daily care of your teeth and gums.  Regular physical activity.  Eating a healthy diet.  Avoiding tobacco and drug use.  Limiting alcohol use.  Practicing safe sex.  Taking low-dose aspirin every day.  Taking vitamin and mineral supplements as recommended by your health care provider. What happens during an annual well check? The services and screenings done by your health care provider during your annual well check will depend on your age, overall health, lifestyle risk factors, and family history of disease. Counseling  Your health care provider may ask you questions about your:  Alcohol use.  Tobacco use.  Drug use.  Emotional well-being.  Home and relationship well-being.  Sexual activity.  Eating habits.  History of falls.  Memory and ability to understand (cognition).  Work and work Statistician.  Reproductive health. Screening  You may have the following tests or measurements:  Height, weight, and BMI.  Blood pressure.  Lipid and cholesterol levels. These may be checked every 5 years, or more frequently if you are over 49 years old.  Skin check.  Lung cancer screening. You may have this screening every year starting at age 36 if you have a 30-pack-year history of smoking and currently smoke or have quit within the past 15 years.  Fecal occult blood test (FOBT) of the stool. You may have this  test every year starting at age 5.  Flexible sigmoidoscopy or colonoscopy. You may have a sigmoidoscopy every 5 years or a colonoscopy every 10 years starting at age 73.  Hepatitis C blood test.  Hepatitis B blood test.  Sexually transmitted disease (STD) testing.  Diabetes screening. This is done by checking your blood sugar (glucose) after you have not eaten for  a while (fasting). You may have this done every 1-3 years.  Bone density scan. This is done to screen for osteoporosis. You may have this done starting at age 60.  Mammogram. This may be done every 1-2 years. Talk to your health care provider about how often you should have regular mammograms. Talk with your health care provider about your test results, treatment options, and if necessary, the need for more tests. Vaccines  Your health care provider may recommend certain vaccines, such as:  Influenza vaccine. This is recommended every year.  Tetanus, diphtheria, and acellular pertussis (Tdap, Td) vaccine. You may need a Td booster every 10 years.  Zoster vaccine. You may need this after age 57.  Pneumococcal 13-valent conjugate (PCV13) vaccine. One dose is recommended after age 31.  Pneumococcal polysaccharide (PPSV23) vaccine. One dose is recommended after age 36. Talk to your health care provider about which screenings and vaccines you need and how often you need them. This information is not intended to replace advice given to you by your health care provider. Make sure you discuss any questions you have with your health care provider. Document Released: 07/18/2015 Document Revised: 03/10/2016 Document Reviewed: 04/22/2015 Elsevier Interactive Patient Education  2017 [employer] Prevention in the Home Falls can cause injuries. They can happen to people of all ages. There are many things you can do to make your home safe and to help prevent falls. What can I do on the outside of my home?  Regularly fix the edges of walkways and driveways and fix any cracks.  Remove anything that might make you trip as you walk through a door, such as a raised step or threshold.  Trim any bushes or trees on the path to your home.  Use bright outdoor lighting.  Clear any walking paths of anything that might make someone trip, such as rocks or tools.  Regularly check to see if handrails  are loose or broken. Make sure that both sides of any steps have handrails.  Any raised decks and porches should have guardrails on the edges.  Have any leaves, snow, or ice cleared regularly.  Use sand or salt on walking paths during winter.  Clean up any spills in your garage right away. This includes oil or grease spills. What can I do in the bathroom?  Use night lights.  Install grab bars by the toilet and in the tub and shower. Do not use towel bars as grab bars.  Use non-skid mats or decals in the tub or shower.  If you need to sit down in the shower, use a plastic, non-slip stool.  Keep the floor dry. Clean up any water that spills on the floor as soon as it happens.  Remove soap buildup in the tub or shower regularly.  Attach bath mats securely with double-sided non-slip rug tape.  Do not have throw rugs and other things on the floor that can make you trip. What can I do in the bedroom?  Use night lights.  Make sure that you have a light by your bed that is easy to reach.  Do not use any sheets or blankets that are too big for your bed. They should not hang down onto the floor.  Have a firm chair that has side arms. You can use this for support while you get dressed.  Do not have throw rugs and other things on the floor that can make you trip. What can I do in the kitchen?  Clean up any spills right away.  Avoid walking on wet floors.  Keep items that you use a lot in easy-to-reach places.  If you need to reach something above you, use a strong step stool that has a grab bar.  Keep electrical cords out of the way.  Do not use floor polish or wax that makes floors slippery. If you must use wax, use non-skid floor wax.  Do not have throw rugs and other things on the floor that can make you trip. What can I do with my stairs?  Do not leave any items on the stairs.  Make sure that there are handrails on both sides of the stairs and use them. Fix handrails  that are broken or loose. Make sure that handrails are as long as the stairways.  Check any carpeting to make sure that it is firmly attached to the stairs. Fix any carpet that is loose or worn.  Avoid having throw rugs at the top or bottom of the stairs. If you do have throw rugs, attach them to the floor with carpet tape.  Make sure that you have a light switch at the top of the stairs and the bottom of the stairs. If you do not have them, ask someone to add them for you. What else can I do to help prevent falls?  Wear shoes that:  Do not have high heels.  Have rubber bottoms.  Are comfortable and fit you well.  Are closed at the toe. Do not wear sandals.  If you use a stepladder:  Make sure that it is fully opened. Do not climb a closed stepladder.  Make sure that both sides of the stepladder are locked into place.  Ask someone to hold it for you, if possible.  Clearly mark and make sure that you can see:  Any grab bars or handrails.  First and last steps.  Where the edge of each step is.  Use tools that help you move around (mobility aids) if they are needed. These include:  Canes.  Walkers.  Scooters.  Crutches.  Turn on the lights when you go into a dark area. Replace any light bulbs as soon as they burn out.  Set up your furniture so you have a clear path. Avoid moving your furniture around.  If any of your floors are uneven, fix them.  If there are any pets around you, be aware of where they are.  Review your medicines with your doctor. Some medicines can make you feel dizzy. This can increase your chance of falling. Ask your doctor what other things that you can do to help prevent falls. This information is not intended to replace advice given to you by your health care provider. Make sure you discuss any questions you have with your health care provider. Document Released: 04/17/2009 Document Revised: 11/27/2015 Document Reviewed: 07/26/2014 Elsevier  Interactive Patient Education  2017 [employer].

## 2020-04-21 DIAGNOSIS — E1122 Type 2 diabetes mellitus with diabetic chronic kidney disease: Secondary | ICD-10-CM | POA: Diagnosis not present

## 2020-04-21 DIAGNOSIS — E1165 Type 2 diabetes mellitus with hyperglycemia: Secondary | ICD-10-CM | POA: Diagnosis not present

## 2020-04-21 DIAGNOSIS — N1832 Chronic kidney disease, stage 3b: Secondary | ICD-10-CM | POA: Diagnosis not present

## 2020-04-28 DIAGNOSIS — E1122 Type 2 diabetes mellitus with diabetic chronic kidney disease: Secondary | ICD-10-CM | POA: Diagnosis not present

## 2020-04-28 DIAGNOSIS — E1159 Type 2 diabetes mellitus with other circulatory complications: Secondary | ICD-10-CM | POA: Diagnosis not present

## 2020-04-28 DIAGNOSIS — I10 Essential (primary) hypertension: Secondary | ICD-10-CM | POA: Diagnosis not present

## 2020-04-28 DIAGNOSIS — N1832 Chronic kidney disease, stage 3b: Secondary | ICD-10-CM | POA: Diagnosis not present

## 2020-04-28 DIAGNOSIS — R809 Proteinuria, unspecified: Secondary | ICD-10-CM | POA: Diagnosis not present

## 2020-04-28 DIAGNOSIS — Z794 Long term (current) use of insulin: Secondary | ICD-10-CM | POA: Diagnosis not present

## 2020-04-28 DIAGNOSIS — E1129 Type 2 diabetes mellitus with other diabetic kidney complication: Secondary | ICD-10-CM | POA: Diagnosis not present

## 2020-05-01 ENCOUNTER — Other Ambulatory Visit: Payer: Self-pay | Admitting: Nephrology

## 2020-05-01 DIAGNOSIS — N1832 Chronic kidney disease, stage 3b: Secondary | ICD-10-CM | POA: Diagnosis not present

## 2020-05-01 DIAGNOSIS — N179 Acute kidney failure, unspecified: Secondary | ICD-10-CM

## 2020-05-01 DIAGNOSIS — N390 Urinary tract infection, site not specified: Secondary | ICD-10-CM | POA: Diagnosis not present

## 2020-05-01 DIAGNOSIS — I10 Essential (primary) hypertension: Secondary | ICD-10-CM | POA: Diagnosis not present

## 2020-05-07 ENCOUNTER — Telehealth: Payer: Self-pay | Admitting: *Deleted

## 2020-05-07 DIAGNOSIS — E785 Hyperlipidemia, unspecified: Secondary | ICD-10-CM

## 2020-05-07 NOTE — Telephone Encounter (Signed)
Please place future orders for lab appt.  

## 2020-05-08 ENCOUNTER — Other Ambulatory Visit (INDEPENDENT_AMBULATORY_CARE_PROVIDER_SITE_OTHER): Payer: Medicare PPO

## 2020-05-08 ENCOUNTER — Other Ambulatory Visit: Payer: Self-pay

## 2020-05-08 DIAGNOSIS — E785 Hyperlipidemia, unspecified: Secondary | ICD-10-CM

## 2020-05-08 LAB — HEPATIC FUNCTION PANEL
ALT: 16 U/L (ref 0–35)
AST: 14 U/L (ref 0–37)
Albumin: 4.2 g/dL (ref 3.5–5.2)
Alkaline Phosphatase: 81 U/L (ref 39–117)
Bilirubin, Direct: 0.1 mg/dL (ref 0.0–0.3)
Total Bilirubin: 0.7 mg/dL (ref 0.2–1.2)
Total Protein: 6 g/dL (ref 6.0–8.3)

## 2020-05-08 LAB — LDL CHOLESTEROL, DIRECT: Direct LDL: 60 mg/dL

## 2020-05-12 ENCOUNTER — Other Ambulatory Visit: Payer: Self-pay

## 2020-05-12 ENCOUNTER — Ambulatory Visit
Admission: RE | Admit: 2020-05-12 | Discharge: 2020-05-12 | Disposition: A | Discharge status: Home / Self Care | Payer: Medicare PPO | Source: Ambulatory Visit | Attending: Oncology | Admitting: Oncology

## 2020-05-12 DIAGNOSIS — Z853 Personal history of malignant neoplasm of breast: Secondary | ICD-10-CM | POA: Diagnosis not present

## 2020-05-12 DIAGNOSIS — Z79811 Long term (current) use of aromatase inhibitors: Secondary | ICD-10-CM | POA: Insufficient documentation

## 2020-05-12 DIAGNOSIS — Z1231 Encounter for screening mammogram for malignant neoplasm of breast: Secondary | ICD-10-CM | POA: Insufficient documentation

## 2020-05-13 ENCOUNTER — Ambulatory Visit
Admission: RE | Admit: 2020-05-13 | Discharge: 2020-05-13 | Disposition: A | Discharge status: Home / Self Care | Payer: Medicare PPO | Source: Ambulatory Visit | Attending: Nephrology | Admitting: Nephrology

## 2020-05-13 DIAGNOSIS — N179 Acute kidney failure, unspecified: Secondary | ICD-10-CM | POA: Diagnosis not present

## 2020-05-14 ENCOUNTER — Inpatient Hospital Stay: Payer: Medicare PPO

## 2020-05-14 ENCOUNTER — Inpatient Hospital Stay: Payer: Medicare PPO | Admitting: Oncology

## 2020-05-16 ENCOUNTER — Inpatient Hospital Stay: Payer: Medicare PPO | Admitting: Oncology

## 2020-05-16 ENCOUNTER — Inpatient Hospital Stay: Payer: Medicare PPO | Attending: Oncology

## 2020-05-16 ENCOUNTER — Other Ambulatory Visit: Payer: Self-pay

## 2020-05-16 VITALS — BP 155/68 | HR 70 | Temp 98.0°F | Resp 18 | Wt 165.1 lb

## 2020-05-16 DIAGNOSIS — Z79899 Other long term (current) drug therapy: Secondary | ICD-10-CM | POA: Diagnosis not present

## 2020-05-16 DIAGNOSIS — E1122 Type 2 diabetes mellitus with diabetic chronic kidney disease: Secondary | ICD-10-CM | POA: Diagnosis not present

## 2020-05-16 DIAGNOSIS — Z87891 Personal history of nicotine dependence: Secondary | ICD-10-CM | POA: Insufficient documentation

## 2020-05-16 DIAGNOSIS — Z853 Personal history of malignant neoplasm of breast: Secondary | ICD-10-CM

## 2020-05-16 DIAGNOSIS — N184 Chronic kidney disease, stage 4 (severe): Secondary | ICD-10-CM | POA: Insufficient documentation

## 2020-05-16 DIAGNOSIS — Z7982 Long term (current) use of aspirin: Secondary | ICD-10-CM | POA: Insufficient documentation

## 2020-05-16 DIAGNOSIS — Z170 Estrogen receptor positive status [ER+]: Secondary | ICD-10-CM | POA: Insufficient documentation

## 2020-05-16 DIAGNOSIS — Z923 Personal history of irradiation: Secondary | ICD-10-CM | POA: Diagnosis not present

## 2020-05-16 DIAGNOSIS — Z9221 Personal history of antineoplastic chemotherapy: Secondary | ICD-10-CM | POA: Insufficient documentation

## 2020-05-16 DIAGNOSIS — M816 Localized osteoporosis [Lequesne]: Secondary | ICD-10-CM | POA: Diagnosis not present

## 2020-05-16 DIAGNOSIS — Z794 Long term (current) use of insulin: Secondary | ICD-10-CM | POA: Insufficient documentation

## 2020-05-16 DIAGNOSIS — C50912 Malignant neoplasm of unspecified site of left female breast: Secondary | ICD-10-CM | POA: Insufficient documentation

## 2020-05-16 DIAGNOSIS — I129 Hypertensive chronic kidney disease with stage 1 through stage 4 chronic kidney disease, or unspecified chronic kidney disease: Secondary | ICD-10-CM | POA: Insufficient documentation

## 2020-05-16 DIAGNOSIS — Z79811 Long term (current) use of aromatase inhibitors: Secondary | ICD-10-CM | POA: Diagnosis not present

## 2020-05-16 DIAGNOSIS — C50911 Malignant neoplasm of unspecified site of right female breast: Secondary | ICD-10-CM | POA: Insufficient documentation

## 2020-05-16 LAB — CBC WITH DIFFERENTIAL/PLATELET
Abs Immature Granulocytes: 0.06 10*3/uL (ref 0.00–0.07)
Basophils Absolute: 0.1 10*3/uL (ref 0.0–0.1)
Basophils Relative: 1 %
Eosinophils Absolute: 0.7 10*3/uL — ABNORMAL HIGH (ref 0.0–0.5)
Eosinophils Relative: 7 %
HCT: 33.8 % — ABNORMAL LOW (ref 36.0–46.0)
Hemoglobin: 11.5 g/dL — ABNORMAL LOW (ref 12.0–15.0)
Immature Granulocytes: 1 %
Lymphocytes Relative: 18 %
Lymphs Abs: 1.7 10*3/uL (ref 0.7–4.0)
MCH: 29.8 pg (ref 26.0–34.0)
MCHC: 34 g/dL (ref 30.0–36.0)
MCV: 87.6 fL (ref 80.0–100.0)
Monocytes Absolute: 0.6 10*3/uL (ref 0.1–1.0)
Monocytes Relative: 7 %
Neutro Abs: 6.3 10*3/uL (ref 1.7–7.7)
Neutrophils Relative %: 66 %
Platelets: 158 10*3/uL (ref 150–400)
RBC: 3.86 MIL/uL — ABNORMAL LOW (ref 3.87–5.11)
RDW: 12.3 % (ref 11.5–15.5)
WBC: 9.4 10*3/uL (ref 4.0–10.5)
nRBC: 0 % (ref 0.0–0.2)

## 2020-05-16 LAB — COMPREHENSIVE METABOLIC PANEL
ALT: 22 U/L (ref 0–44)
AST: 20 U/L (ref 15–41)
Albumin: 4.1 g/dL (ref 3.5–5.0)
Alkaline Phosphatase: 75 U/L (ref 38–126)
Anion gap: 6 (ref 5–15)
BUN: 12 mg/dL (ref 8–23)
CO2: 26 mmol/L (ref 22–32)
Calcium: 9 mg/dL (ref 8.9–10.3)
Chloride: 103 mmol/L (ref 98–111)
Creatinine, Ser: 1.56 mg/dL — ABNORMAL HIGH (ref 0.44–1.00)
GFR, Estimated: 34 mL/min — ABNORMAL LOW (ref >60)
Glucose, Bld: 116 mg/dL — ABNORMAL HIGH (ref 70–99)
Potassium: 3.8 mmol/L (ref 3.5–5.1)
Sodium: 135 mmol/L (ref 135–145)
Total Bilirubin: 0.6 mg/dL (ref 0.3–1.2)
Total Protein: 6.4 g/dL — ABNORMAL LOW (ref 6.5–8.1)

## 2020-05-16 NOTE — Progress Notes (Signed)
Patient here for oncology follow-up appointment, expresses  concerns of headaches 

## 2020-05-17 NOTE — Progress Notes (Signed)
[facility] day:  05/17/2020  Chief Complaint: Setareh Rom is a 79 y.o. female with bilateral stage IA breast cancer who is seen for 6 month assessment on Femara.  PERTINENT ONCOLOGY HISTORY Letia Guidry is a 79 y.o.afemale who has above oncology history reviewed by me today presented for follow up visit for management of history of bilateral breast cancer. Patient previously follows up with Dr. Mike Gip. Switched care to me on 09/18/2018. Medical record review was performed by me.  bilateral breast cancer s/p lumpectomy and sentinel lymph node biopsy on 11/17/2012.  Right breast revealed a 5 mm grade I invasive carcinoma.  There was no DCIS.  Two sentinel lymph nodes were negative.  Pathologic stage was T1aN0M0.  Tumor was ER + (90%), PR + (20%), and Her2/neu -.   Left breast revealed a 1.8 cm grade III invasive ductal carcinoma.  There was lymphovascular invasion.  There was no DCIS.  One sentinel lymph node was negative.  Pathologic stage was T1cN0M0.  Tumor was ER + (>90%), PR + (1-5%), and Her2/neu -.  Oncotype DX testing on the left breast mass revealed a recurrence score of 28 which corresponded to a 10 year risk of distant recurrence of 19% (CI 15-23%) with tamoxifen alone.  BCI testing on 10/04/2017 revealed a high risk of late recurrence (5.4%; CI 2.5%-8.2%) during years 5-10.  There was a low likelihood of benefit from extended endocrine therapy.  She received Adriamycin and Cytoxan (AC) x 4 cycles followed by weekly Taxol x 10 (completed 06/05/2013).  She did not receive 2 cycles of Taxol secondary to progressive neuropathy.  She received bilateral breast radiation (07/2013 - 09/2013).  She began letrozole (Femara) in 09/2013.  CA27.29 has been followed:  24.4 on 09/10/2016, 28.1 on 03/14/2017, 24.9 on 09/12/2017, and 22.8 on 03/13/2018. She has a history of a borderline mild normocytic anemia.  Diet appears modest.   She denies any melena, hematochezia, hematuria or vaginal bleeding. Colonoscopy on 10/13/2015 was negative.    Soft tissue neck ultrasound on 03/16/2017 revealed no fluid collection or soft tissue lesion.  09/2013- 12/2019 Letrozole, which was discontinued due to worsening of osteoporosis and  Her BCI was obtained previously 10/04/2017 revealed a high risk of late recurrence (5.4%; CI 2.5%-8.2%) during years 5-10.  There was a low likelihood of benefit from extended endocrine therapy bone density done on 12/20/2019 Her T score was -2.3 in 2019 now left femoral neck T score at -2.5. She has progressed to osteoporosis state. I have previously discussed with patient about dental clearance and bisphosphonate use.  At that time, patient decided not to proceed with intervention due to COVID-19 pandemic      Saxis Bacchi is a 79 y.o. female who has above history reviewed by me today presents for follow up visit for management of bilateral breast cancer. Problems and complaints are listed below: Patient reports no new complaints. Off letrozole Annual mammogram was done in November 2021 She has no new complaints except occasional headche.   Review of Systems  Constitutional: Negative for appetite change, chills, fatigue and fever.  HENT:   Negative for hearing loss and voice change.   Eyes: Negative for eye problems.  Respiratory: Negative for chest tightness and cough.   Cardiovascular: Negative for chest pain.  Gastrointestinal: Negative for abdominal distention, abdominal pain, blood in stool and nausea.  Endocrine: Negative for hot flashes.  Genitourinary: Negative for difficulty urinating and  frequency.   Musculoskeletal: Negative for arthralgias.  Skin: Negative for itching and rash.  Neurological: Positive for headaches. Negative for extremity weakness.  Hematological: Negative for adenopathy.  Psychiatric/Behavioral: Negative for confusion.     Past  Medical History:  Diagnosis Date  . Breast symptom 2014  . Cancer [facility]) 2014   Bilateral Breast, chemo Dr. Jeb Levering  . Diabetes mellitus   . GERD (gastroesophageal reflux disease)    mild  . Hypertension   . Personal history of chemotherapy   . Personal history of radiation therapy 2014   bilat lumpectomy    Past Surgical History:  Procedure Laterality Date  . ABDOMINAL AORTOGRAM W/LOWER EXTREMITY N/A 04/21/2018   Procedure: ABDOMINAL AORTOGRAM W/LOWER EXTREMITY;  Surgeon: Nelva Bush, MD;  Location: [facility];  Service: Cardiovascular;  Laterality: N/A;  . ABDOMINAL HYSTERECTOMY  1976   for pelvic pain  . BREAST EXCISIONAL BIOPSY Bilateral 11/2012   bilat breast ca chemo and rad  . BREAST SURGERY Bilateral 2014   bilateral lumpectomy and SN biopsy  . CHOLECYSTECTOMY  2007  . COLONOSCOPY WITH PROPOFOL N/A 10/13/2015   Procedure: COLONOSCOPY WITH PROPOFOL;  Surgeon: Manya Silvas, MD;  Location: [facility];  Service: Endoscopy;  Laterality: N/A;  . LEFT HEART CATH AND CORONARY ANGIOGRAPHY N/A 04/21/2018   Procedure: LEFT HEART CATH AND CORONARY ANGIOGRAPHY;  Surgeon: Nelva Bush, MD;  Location: [facility];  Service: Cardiovascular;  Laterality: N/A;  . OOPHORECTOMY Right   . PORTACATH PLACEMENT  2014  . TUMOR REMOVAL Right 1980   right foot    Family History  Problem Relation Age of Onset  . Alcohol abuse Mother   . Heart disease Mother   . Hypertension Mother   . Breast cancer Mother 31  . Alcohol abuse Father   . Heart disease Father   . Hypertension Father   . Heart disease Brother   . Heart attack Brother    Social History   Socioeconomic History  . Marital status: Widowed    Spouse name: Not on file  . Number of children: Not on file  . Years of education: Not on file  . Highest education level: Not on file  Occupational History  . Not on file  Tobacco Use  . Smoking status: Former Smoker    Packs/day: 0.25    Years: 1.00     Pack years: 0.25    Types: Cigarettes    Quit date: 1963    Years since quitting: 58.9  . Smokeless tobacco: Never Used  Vaping Use  . Vaping Use: Never used  Substance and Sexual Activity  . Alcohol use: No    Alcohol/week: 0.0 standard drinks  . Drug use: No  . Sexual activity: Never  Other Topics Concern  . Not on file  Social History Narrative   Lives in [place].       Works - [employer]   Diet - regular   Exercise - dance 3x per week   Social Determinants of Health   Financial Resource Strain: Low Risk   . Difficulty of Paying Living Expenses: Not hard at all  Food Insecurity: No Food Insecurity  . Worried About Charity fundraiser in the Last Year: Never true  . Ran Out of Food in the Last Year: Never true  Transportation Needs: No Transportation Needs  . Lack of Transportation (Medical): No  . Lack of Transportation (Non-Medical): No  Physical Activity:   . Days of  Exercise per Week: Not on file  . Minutes of Exercise per Session: Not on file  Stress: No Stress Concern Present  . Feeling of Stress : Not at all  Social Connections:   . Frequency of Communication with Friends and Family: Not on file  . Frequency of Social Gatherings with Friends and Family: Not on file  . Attends Religious Services: Not on file  . Active Member of Clubs or Organizations: Not on file  . Attends Archivist Meetings: Not on file  . Marital Status: Not on file  Intimate Partner Violence:   . Fear of Current or Ex-Partner: Not on file  . Emotionally Abused: Not on file  . Physically Abused: Not on file  . Sexually Abused: Not on file     She lives by herself in [place].  Husband passed away many years ago.  The patient is alone today.  Allergies:  Allergies  Allergen Reactions  . Advil [Ibuprofen] Other (See Comments)    Dizziness   . Aleve [Naproxen] Other (See Comments)    Dizziness  . Fluticasone-Salmeterol Other (See Comments)    dizziness  .  Lipitor [Atorvastatin] Other (See Comments)    Legs weak.  Leg pain, muscle ache  . Naproxen Sodium Other (See Comments)  . Penicillins Hives and Other (See Comments)    Has patient had a PCN reaction causing immediate rash, facial/tongue/throat swelling, SOB or lightheadedness with hypotension: YES Has patient had a PCN reaction causing severe rash involving mucus membranes or skin necrosis: no Has patient had a PCN reaction that required hospitalization: no Has patient had a PCN reaction occurring within the last 10 years: no If all of the above answers are "NO", then may proceed with Cephalosporin use.     Current Medications: Current Outpatient Medications  Medication Sig Dispense Refill  . aspirin 81 MG tablet Take 81 mg by mouth daily.    . blood glucose meter kit and supplies KIT Dispense based on patient and insurance preference. Use up to four times daily as directed. One Touch Ultra mini meter (FOR ICD-9 250.00, 250.01). Dx: E11.9 1 each 0  . carvedilol (COREG) 12.5 MG tablet Take 1 tablet (12.5 mg total) by mouth 2 (two) times daily. 180 tablet 2  . cholecalciferol (VITAMIN D) 25 MCG (1000 UT) tablet Take 1,000 Units by mouth daily.    . dapagliflozin propanediol (FARXIGA) 10 MG TABS tablet Take by mouth.    . furosemide (LASIX) 20 MG tablet Take 1 tablet (20 mg total) by mouth daily as needed (for >2lb weight gain overnight or 5 lb weight gain in 1 week or swelling.). 30 tablet 6  . glimepiride (AMARYL) 2 MG tablet Take 2 mg by mouth daily with breakfast.    . glucose blood (ONE TOUCH ULTRA TEST) test strip USE TO TEST BLOOD SUGAR UP TO 4 TIMES DAILY 100 each 6  . LANTUS SOLOSTAR 100 UNIT/ML Solostar Pen INJECT 20 UNITS INTO THE SKIN DAILY AT 10 PM. (Patient taking differently: Inject 40 Units into the skin at bedtime. ) 18 pen 0  . losartan (COZAAR) 100 MG tablet TAKE 0.5 TABLETS (50 MG TOTAL) BY MOUTH 2 (TWO) TIMES DAILY. 90 tablet 1  . NOVOFINE 32G X 6 MM MISC USE AS DIRECTED  WITH LANTUS PEN 100 each 2  . propranolol ER (INDERAL LA) 60 MG 24 hr capsule Take 1 tablet by mouth daily.    . rosuvastatin (CRESTOR) 20 MG tablet Take 1 tablet (20  mg total) by mouth daily. 90 tablet 3  . TRULICITY 1.5 XI/5.0TU SOPN Inject 1.5 mg as directed once a week.  11  . vitamin B-12 (CYANOCOBALAMIN) 1000 MCG tablet Take 1,000 mcg by mouth daily.    [name] loratadine-pseudoephedrine (CLARITIN-D 24-HOUR) 10-240 MG 24 hr tablet Take 1 tablet by mouth daily. (Patient not taking: Reported on 05/16/2020)    . nitroGLYCERIN (NITROSTAT) 0.4 MG SL tablet Place 1 tablet (0.4 mg total) under the tongue every 5 (five) minutes as needed for chest pain. Maximum of 3 doses. (Patient not taking: Reported on 05/16/2020) 25 tablet 3   No current facility-administered medications for this visit.   Physical Exam: Blood pressure (!) 155/68, pulse 70, temperature 98 F (36.7 C), temperature source Tympanic, resp. rate 18, weight 165 lb 1.6 oz (74.9 kg), SpO2 100 %. Physical Exam Constitutional:      General: She is not in acute distress.    Appearance: She is not diaphoretic.  HENT:     Head: Normocephalic and atraumatic.     Nose: Nose normal.     Mouth/Throat:     Pharynx: No oropharyngeal exudate.  Eyes:     General: No scleral icterus.    Pupils: Pupils are equal, round, and reactive to light.  Cardiovascular:     Rate and Rhythm: Normal rate and regular rhythm.     Heart sounds: No murmur heard.   Pulmonary:     Effort: Pulmonary effort is normal. No respiratory distress.     Breath sounds: No rales.  Chest:     Chest wall: No tenderness.  Abdominal:     General: There is no distension.     Palpations: Abdomen is soft.     Tenderness: There is no abdominal tenderness.  Musculoskeletal:        General: Normal range of motion.     Cervical back: Normal range of motion and neck supple.  Skin:    General: Skin is warm and dry.     Findings: No erythema.  Neurological:     Mental Status:  She is alert and oriented to person, place, and time.     Cranial Nerves: No cranial nerve deficit.     Motor: No abnormal muscle tone.     Coordination: Coordination normal.  Psychiatric:        Mood and Affect: Affect normal.   Patient declines breast examination.    Appointment on 05/16/2020  Component Date Value Ref Range Status  . Sodium 05/16/2020 135  135 - 145 mmol/L Final  . Potassium 05/16/2020 3.8  3.5 - 5.1 mmol/L Final  . Chloride 05/16/2020 103  98 - 111 mmol/L Final  . CO2 05/16/2020 26  22 - 32 mmol/L Final  . Glucose, Bld 05/16/2020 116* 70 - 99 mg/dL Final   Glucose reference range applies only to samples taken after fasting for at least 8 hours.  . BUN 05/16/2020 12  8 - 23 mg/dL Final  . Creatinine, Ser 05/16/2020 1.56* 0.44 - 1.00 mg/dL Final  . Calcium 05/16/2020 9.0  8.9 - 10.3 mg/dL Final  . Total Protein 05/16/2020 6.4* 6.5 - 8.1 g/dL Final  . Albumin 05/16/2020 4.1  3.5 - 5.0 g/dL Final  . AST 05/16/2020 20  15 - 41 U/L Final  . ALT 05/16/2020 22  0 - 44 U/L Final  . Alkaline Phosphatase 05/16/2020 75  38 - 126 U/L Final  . Total Bilirubin 05/16/2020 0.6  0.3 - 1.2 mg/dL Final  .  GFR, Estimated 05/16/2020 34* >60 mL/min Final   Comment: (NOTE) Calculated using the CKD-EPI Creatinine Equation (2021)   . Anion gap 05/16/2020 6  5 - 15 Final   Performed at [facility], [address] [place], [place] [address]  . WBC 05/16/2020 9.4  4.0 - 10.5 K/uL Final  . RBC 05/16/2020 3.86* 3.87 - 5.11 MIL/uL Final  . Hemoglobin 05/16/2020 11.5* 12.0 - 15.0 g/dL Final  . HCT 05/16/2020 33.8* 36 - 46 % Final  . MCV 05/16/2020 87.6  80.0 - 100.0 fL Final  . MCH 05/16/2020 29.8  26.0 - 34.0 pg Final  . MCHC 05/16/2020 34.0  30.0 - 36.0 g/dL Final  . RDW 05/16/2020 12.3  11.5 - 15.5 % Final  . Platelets 05/16/2020 158  150 - 400 K/uL Final  . nRBC 05/16/2020 0.0  0.0 - 0.2 % Final  . Neutrophils Relative % 05/16/2020 66  % Final  . Neutro Abs 05/16/2020 6.3   1.7 - 7.7 K/uL Final  . Lymphocytes Relative 05/16/2020 18  % Final  . Lymphs Abs 05/16/2020 1.7  0.7 - 4.0 K/uL Final  . Monocytes Relative 05/16/2020 7  % Final  . Monocytes Absolute 05/16/2020 0.6  0.1 - 1.0 K/uL Final  . Eosinophils Relative 05/16/2020 7  % Final  . Eosinophils Absolute 05/16/2020 0.7* 0.0 - 0.5 K/uL Final  . Basophils Relative 05/16/2020 1  % Final  . Basophils Absolute 05/16/2020 0.1  0.0 - 0.1 K/uL Final  . Immature Granulocytes 05/16/2020 1  % Final  . Abs Immature Granulocytes 05/16/2020 0.06  0.00 - 0.07 K/uL Final   Performed at [facility], [address] [place], [place] [address]   RADIOGRAPHIC STUDIES: I have personally reviewed the radiological images as listed and agreed with the findings in the report. US RENAL  Result Date: 05/13/2020 CLINICAL DATA:  Acute kidney injury EXAM: RENAL / URINARY TRACT ULTRASOUND COMPLETE COMPARISON:  None. FINDINGS: Right Kidney: Renal measurements: 9.0 x 4.9 x 4.7 cm = volume: 109 mL. Echogenicity within normal limits. There is renal cortical thinning. No mass, perinephric fluid, or hydronephrosis visualized. No sonographically demonstrable calculus or ureterectasis. Left Kidney: Renal measurements: 10.1 x 4.9 x 4.3 cm = volume: 110 mL. Echogenicity within normal limits. There is renal cortical thinning. No mass, perinephric fluid, or hydronephrosis visualized. No sonographically demonstrable calculus or ureterectasis. Bladder: Appears normal for degree of bladder distention. Other: None. IMPRESSION: There is renal cortical thinning which may be a function of age but also may be indicative of medical renal disease. Note that the renal echogenicity bilaterally is within normal limits. No obstructing focus in either kidney. Study otherwise unremarkable. Electronically Signed   By: Lowella Grip III M.D.   On: 05/13/2020 19:26   MM 3D SCREEN BREAST BILATERAL  Result Date: 05/14/2020 CLINICAL DATA:  Screening. EXAM:  DIGITAL SCREENING BILATERAL MAMMOGRAM WITH TOMO AND CAD COMPARISON:  Previous exam(s). ACR Breast Density Category b: There are scattered areas of fibroglandular density. FINDINGS: There are no findings suspicious for malignancy. Images were processed with CAD. IMPRESSION: No mammographic evidence of malignancy. A result letter of this screening mammogram will be mailed directly to the patient. RECOMMENDATION: Screening mammogram in one year. (Code:SM-B-01Y) BI-RADS CATEGORY  1: Negative. Electronically Signed   By: Audie Pinto M.D.   On: 05/14/2020 09:34    Assessment:  Ramiya Delahunty is a 79 y.o. female presents for follow-up of bilateral stage I breast cancer.  1. History  of breast cancer   2. Stage 4 chronic kidney disease ([place])   3. Localized osteoporosis without current pathological fracture   # History of breast cancer- 2014 Off Letrozole.  Mammogram was reviewed and discussed with patient.  Continue annual mammogram surveillance.   #CKD, she appears to have worsening of kidney function.   Check light chain ratio and protein electrophoresis.   # Osteoporosis, recommend patient to continue calcium and vitamin D supplementation.  Previously discussed about bisphosphonate and she declined.   RTC in 6 months MD assessment and labs   Earlie Server, MD, PhD Hematology Oncology [facility] at [facility] Pager- [phone] 05/17/2020

## 2020-05-19 LAB — KAPPA/LAMBDA LIGHT CHAINS
Kappa free light chain: 29.7 mg/L — ABNORMAL HIGH (ref 3.3–19.4)
Kappa, lambda light chain ratio: 1.43 (ref 0.26–1.65)
Lambda free light chains: 20.7 mg/L (ref 5.7–26.3)

## 2020-05-19 LAB — PROTEIN ELECTROPHORESIS, SERUM
A/G Ratio: 1.6 (ref 0.7–1.7)
Albumin ELP: 3.7 g/dL (ref 2.9–4.4)
Alpha-1-Globulin: 0.2 g/dL (ref 0.0–0.4)
Alpha-2-Globulin: 0.7 g/dL (ref 0.4–1.0)
Beta Globulin: 0.8 g/dL (ref 0.7–1.3)
Gamma Globulin: 0.5 g/dL (ref 0.4–1.8)
Globulin, Total: 2.3 g/dL (ref 2.2–3.9)
Total Protein ELP: 6 g/dL (ref 6.0–8.5)

## 2020-05-23 DIAGNOSIS — E113293 Type 2 diabetes mellitus with mild nonproliferative diabetic retinopathy without macular edema, bilateral: Secondary | ICD-10-CM | POA: Diagnosis not present

## 2020-05-23 DIAGNOSIS — H2513 Age-related nuclear cataract, bilateral: Secondary | ICD-10-CM | POA: Diagnosis not present

## 2020-05-23 LAB — HM DIABETES EYE EXAM

## 2020-05-28 DIAGNOSIS — E1129 Type 2 diabetes mellitus with other diabetic kidney complication: Secondary | ICD-10-CM | POA: Diagnosis not present

## 2020-05-28 DIAGNOSIS — N179 Acute kidney failure, unspecified: Secondary | ICD-10-CM | POA: Diagnosis not present

## 2020-05-28 DIAGNOSIS — R809 Proteinuria, unspecified: Secondary | ICD-10-CM | POA: Diagnosis not present

## 2020-05-28 DIAGNOSIS — I10 Essential (primary) hypertension: Secondary | ICD-10-CM | POA: Diagnosis not present

## 2020-05-28 DIAGNOSIS — N1832 Chronic kidney disease, stage 3b: Secondary | ICD-10-CM | POA: Diagnosis not present

## 2020-05-28 DIAGNOSIS — R600 Localized edema: Secondary | ICD-10-CM | POA: Diagnosis not present

## 2020-06-17 ENCOUNTER — Other Ambulatory Visit: Payer: Self-pay | Admitting: Internal Medicine

## 2020-06-17 NOTE — Telephone Encounter (Signed)
Rx request sent to pharmacy.  

## 2020-07-18 ENCOUNTER — Other Ambulatory Visit: Payer: Self-pay

## 2020-07-22 ENCOUNTER — Ambulatory Visit: Payer: Medicare PPO | Admitting: Family Medicine

## 2020-07-22 DIAGNOSIS — H2512 Age-related nuclear cataract, left eye: Secondary | ICD-10-CM | POA: Diagnosis not present

## 2020-07-23 ENCOUNTER — Other Ambulatory Visit: Payer: Self-pay

## 2020-07-23 ENCOUNTER — Encounter: Payer: Self-pay | Admitting: Ophthalmology

## 2020-07-23 DIAGNOSIS — E1122 Type 2 diabetes mellitus with diabetic chronic kidney disease: Secondary | ICD-10-CM | POA: Diagnosis not present

## 2020-07-23 DIAGNOSIS — N1832 Chronic kidney disease, stage 3b: Secondary | ICD-10-CM | POA: Diagnosis not present

## 2020-07-28 ENCOUNTER — Other Ambulatory Visit: Payer: Self-pay

## 2020-07-28 ENCOUNTER — Other Ambulatory Visit
Admission: RE | Admit: 2020-07-28 | Discharge: 2020-07-28 | Disposition: A | Discharge status: Home / Self Care | Payer: Medicare PPO | Source: Ambulatory Visit | Attending: Ophthalmology | Admitting: Ophthalmology

## 2020-07-28 DIAGNOSIS — Z01812 Encounter for preprocedural laboratory examination: Secondary | ICD-10-CM | POA: Insufficient documentation

## 2020-07-28 DIAGNOSIS — E113213 Type 2 diabetes mellitus with mild nonproliferative diabetic retinopathy with macular edema, bilateral: Secondary | ICD-10-CM | POA: Diagnosis not present

## 2020-07-28 DIAGNOSIS — Z20822 Contact with and (suspected) exposure to covid-19: Secondary | ICD-10-CM | POA: Insufficient documentation

## 2020-07-28 LAB — SARS CORONAVIRUS 2 (TAT 6-24 HRS): SARS Coronavirus 2: NEGATIVE

## 2020-07-28 NOTE — Discharge Instructions (Signed)

## 2020-07-30 ENCOUNTER — Encounter
Admission: RE | Disposition: A | Discharge status: Home / Self Care | Payer: Self-pay | Source: Home / Self Care | Attending: Ophthalmology

## 2020-07-30 ENCOUNTER — Ambulatory Visit: Payer: Medicare PPO | Admitting: Anesthesiology

## 2020-07-30 ENCOUNTER — Ambulatory Visit
Admission: RE | Admit: 2020-07-30 | Discharge: 2020-07-30 | Disposition: A | Discharge status: Home / Self Care | Payer: Medicare PPO | Attending: Ophthalmology | Admitting: Ophthalmology

## 2020-07-30 ENCOUNTER — Encounter: Payer: Self-pay | Admitting: Ophthalmology

## 2020-07-30 ENCOUNTER — Other Ambulatory Visit: Payer: Self-pay

## 2020-07-30 DIAGNOSIS — H2512 Age-related nuclear cataract, left eye: Secondary | ICD-10-CM | POA: Insufficient documentation

## 2020-07-30 DIAGNOSIS — Z794 Long term (current) use of insulin: Secondary | ICD-10-CM | POA: Insufficient documentation

## 2020-07-30 DIAGNOSIS — I110 Hypertensive heart disease with heart failure: Secondary | ICD-10-CM | POA: Insufficient documentation

## 2020-07-30 DIAGNOSIS — I509 Heart failure, unspecified: Secondary | ICD-10-CM | POA: Insufficient documentation

## 2020-07-30 DIAGNOSIS — Z886 Allergy status to analgesic agent status: Secondary | ICD-10-CM | POA: Insufficient documentation

## 2020-07-30 DIAGNOSIS — Z888 Allergy status to other drugs, medicaments and biological substances status: Secondary | ICD-10-CM | POA: Insufficient documentation

## 2020-07-30 DIAGNOSIS — Z79899 Other long term (current) drug therapy: Secondary | ICD-10-CM | POA: Insufficient documentation

## 2020-07-30 DIAGNOSIS — H25812 Combined forms of age-related cataract, left eye: Secondary | ICD-10-CM | POA: Diagnosis not present

## 2020-07-30 HISTORY — DX: Unspecified osteoarthritis, unspecified site: M19.90

## 2020-07-30 HISTORY — PX: CATARACT EXTRACTION W/PHACO: SHX586

## 2020-07-30 HISTORY — DX: Other intervertebral disc degeneration, lumbar region: M51.36

## 2020-07-30 HISTORY — DX: Other intervertebral disc degeneration, lumbar region without mention of lumbar back pain or lower extremity pain: M51.369

## 2020-07-30 LAB — GLUCOSE, CAPILLARY
Glucose-Capillary: 109 mg/dL — ABNORMAL HIGH (ref 70–99)
Glucose-Capillary: 109 mg/dL — ABNORMAL HIGH (ref 70–99)

## 2020-07-30 SURGERY — PHACOEMULSIFICATION, CATARACT, WITH IOL INSERTION
Anesthesia: Monitor Anesthesia Care | Site: Eye | Laterality: Left

## 2020-07-30 MED ORDER — MIDAZOLAM HCL 2 MG/2ML IJ SOLN
INTRAMUSCULAR | Status: DC | PRN
  Administered 2020-07-30: 1 mg via INTRAVENOUS

## 2020-07-30 MED ORDER — LIDOCAINE HCL (PF) 2 % IJ SOLN
INTRAOCULAR | Status: DC | PRN
  Administered 2020-07-30: 2 mL

## 2020-07-30 MED ORDER — ONDANSETRON HCL 4 MG/2ML IJ SOLN: 4.0000 mg | Freq: Once | INTRAMUSCULAR | Status: DC | PRN

## 2020-07-30 MED ORDER — NA HYALUR & NA CHOND-NA HYALUR 0.4-0.35 ML IO KIT
PACK | INTRAOCULAR | Status: DC | PRN
  Administered 2020-07-30: 1 mL via INTRAOCULAR

## 2020-07-30 MED ORDER — ACETAMINOPHEN 325 MG PO TABS: 325.0000 mg | ORAL_TABLET | ORAL | Status: DC | PRN

## 2020-07-30 MED ORDER — MOXIFLOXACIN HCL 0.5 % OP SOLN
OPHTHALMIC | Status: DC | PRN
  Administered 2020-07-30: 0.2 mL via OPHTHALMIC

## 2020-07-30 MED ORDER — BRIMONIDINE TARTRATE-TIMOLOL 0.2-0.5 % OP SOLN
OPHTHALMIC | Status: DC | PRN
  Administered 2020-07-30: 1 [drp] via OPHTHALMIC

## 2020-07-30 MED ORDER — ACETAMINOPHEN 160 MG/5ML PO SOLN: 325.0000 mg | ORAL | Status: DC | PRN

## 2020-07-30 MED ORDER — TETRACAINE HCL 0.5 % OP SOLN
1.0000 [drp] | OPHTHALMIC | Status: DC | PRN
  Administered 2020-07-30 (×3): 1 [drp] via OPHTHALMIC

## 2020-07-30 MED ORDER — ARMC OPHTHALMIC DILATING DROPS
1.0000 "application " | OPHTHALMIC | Status: DC | PRN
  Administered 2020-07-30 (×3): 1 via OPHTHALMIC

## 2020-07-30 MED ORDER — EPINEPHRINE PF 1 MG/ML IJ SOLN
INTRAOCULAR | Status: DC | PRN
  Administered 2020-07-30: 88 mL via OPHTHALMIC

## 2020-07-30 MED ORDER — FENTANYL CITRATE (PF) 100 MCG/2ML IJ SOLN
INTRAMUSCULAR | Status: DC | PRN
  Administered 2020-07-30: 50 ug via INTRAVENOUS

## 2020-07-30 SURGICAL SUPPLY — 22 items
CANNULA ANT/CHMB 27G (MISCELLANEOUS) ×1 IMPLANT
CANNULA ANT/CHMB 27GA (MISCELLANEOUS) ×2 IMPLANT
GLOVE SURG LX 7.5 STRW (GLOVE) ×1
GLOVE SURG LX STRL 7.5 STRW (GLOVE) ×1 IMPLANT
GLOVE SURG TRIUMPH 8.0 PF LTX (GLOVE) ×2 IMPLANT
GOWN STRL REUS W/ TWL LRG LVL3 (GOWN DISPOSABLE) ×2 IMPLANT
GOWN STRL REUS W/TWL LRG LVL3 (GOWN DISPOSABLE) ×4
LENS IOL TECNIS EYHANCE 19.5 (Intraocular Lens) ×1 IMPLANT
MARKER SKIN DUAL TIP RULER LAB (MISCELLANEOUS) ×2 IMPLANT
NDL CAPSULORHEX 25GA (NEEDLE) ×1 IMPLANT
NDL FILTER BLUNT 18X1 1/2 (NEEDLE) ×2 IMPLANT
NEEDLE CAPSULORHEX 25GA (NEEDLE) ×2 IMPLANT
NEEDLE FILTER BLUNT 18X 1/2SAF (NEEDLE) ×2
NEEDLE FILTER BLUNT 18X1 1/2 (NEEDLE) ×2 IMPLANT
PACK CATARACT BRASINGTON (MISCELLANEOUS) ×2 IMPLANT
PACK EYE AFTER SURG (MISCELLANEOUS) ×2 IMPLANT
PACK OPTHALMIC (MISCELLANEOUS) ×2 IMPLANT
SOLUTION OPHTHALMIC SALT (MISCELLANEOUS) ×2 IMPLANT
SYR 3ML LL SCALE MARK (SYRINGE) ×4 IMPLANT
SYR TB 1ML LUER SLIP (SYRINGE) ×2 IMPLANT
WATER STERILE IRR 250ML POUR (IV SOLUTION) ×2 IMPLANT
WIPE NON LINTING 3.25X3.25 (MISCELLANEOUS) ×2 IMPLANT

## 2020-07-30 NOTE — Op Note (Signed)
OPERATIVE NOTE  Kelsey Mason [id number] 07/30/2020   PREOPERATIVE DIAGNOSIS:  Nuclear sclerotic cataract left eye. H25.12   POSTOPERATIVE DIAGNOSIS:    Nuclear sclerotic cataract left eye.     PROCEDURE:  Phacoemusification with posterior chamber intraocular lens placement of the left eye  Ultrasound time: Procedure(s) with comments: CATARACT EXTRACTION PHACO AND INTRAOCULAR LENS PLACEMENT (IOC) LEFT DIABETIC 18.73 01:45.0 17.8% (Left) - Diabetic - insulin and opral meds  LENS:   Implant Name Type Inv. Item Serial No. Manufacturer Lot No. LRB No. Used Action  LENS IOL TECNIS EYHANCE 19.5 - [id number] Intraocular Lens LENS IOL TECNIS EYHANCE 19.5 [id number] JOHNSON   Left 1 Implanted      SURGEON:  Wyonia Hough, MD   ANESTHESIA:  Topical with tetracaine drops and 2% Xylocaine jelly, augmented with 1% preservative-free intracameral lidocaine.    COMPLICATIONS:  None.   DESCRIPTION OF PROCEDURE:  The patient was identified in the holding room and transported to the operating room and placed in the supine position under the operating microscope.  The left eye was identified as the operative eye and it was prepped and draped in the usual sterile ophthalmic fashion.   A 1 millimeter clear-corneal paracentesis was made at the 1:30 position.  0.5 ml of preservative-free 1% lidocaine was injected into the anterior chamber.  The anterior chamber was filled with Viscoat viscoelastic.  A 2.4 millimeter keratome was used to make a near-clear corneal incision at the 10:30 position.  .  A curvilinear capsulorrhexis was made with a cystotome and capsulorrhexis forceps.  Balanced salt solution was used to hydrodissect and hydrodelineate the nucleus.   Phacoemulsification was then used in stop and chop fashion to remove the lens nucleus and epinucleus.  The remaining cortex was then removed using the irrigation and aspiration handpiece. Provisc was then placed into the capsular bag  to distend it for lens placement.  A lens was then injected into the capsular bag.  The remaining viscoelastic was aspirated.   Wounds were hydrated with balanced salt solution.  The anterior chamber was inflated to a physiologic pressure with balanced salt solution.  No wound leaks were noted. Vigamox 0.2 ml of a 1mg  per ml solution was injected into the anterior chamber for a dose of 0.2 mg of intracameral antibiotic at the completion of the case.   Timolol and Brimonidine drops were applied to the eye.  The patient was taken to the recovery room in stable condition without complications of anesthesia or surgery.  [name] 07/30/2020, 10:39 AM

## 2020-07-30 NOTE — H&P (Signed)

## 2020-07-30 NOTE — Transfer of Care (Signed)
Immediate Anesthesia Transfer of Care Note  Patient: Kelsey Mason  Procedure(s) Performed: CATARACT EXTRACTION PHACO AND INTRAOCULAR LENS PLACEMENT (IOC) LEFT DIABETIC 18.73 01:45.0 17.8% (Left Eye)  Patient Location: PACU  Anesthesia Type: MAC  Level of Consciousness: awake, alert  and patient cooperative  Airway and Oxygen Therapy: Patient Spontanous Breathing and Patient connected to supplemental oxygen  Post-op Assessment: Post-op Vital signs reviewed, Patient's Cardiovascular Status Stable, Respiratory Function Stable, Patent Airway and No signs of Nausea or vomiting  Post-op Vital Signs: Reviewed and stable  Complications: No complications documented.

## 2020-07-30 NOTE — Anesthesia Procedure Notes (Signed)
Date/Time: 07/30/2020 10:19 AM Performed by: Dionne Bucy, CRNA Pre-anesthesia Checklist: Patient identified, Emergency Drugs available, Suction available, Patient being monitored and Timeout performed Oxygen Delivery Method: Nasal cannula Placement Confirmation: positive ETCO2

## 2020-07-30 NOTE — Anesthesia Postprocedure Evaluation (Signed)
Anesthesia Post Note  Patient: Kelsey Mason  Procedure(s) Performed: CATARACT EXTRACTION PHACO AND INTRAOCULAR LENS PLACEMENT (IOC) LEFT DIABETIC 18.73 01:45.0 17.8% (Left Eye)     Patient location during evaluation: PACU Anesthesia Type: MAC Level of consciousness: awake and alert Pain management: pain level controlled Vital Signs Assessment: post-procedure vital signs reviewed and stable Respiratory status: spontaneous breathing Cardiovascular status: blood pressure returned to baseline Postop Assessment: no apparent nausea or vomiting, adequate PO intake and no headache Anesthetic complications: no   No complications documented.  Adele Barthel Brinleigh Tew

## 2020-07-30 NOTE — Anesthesia Preprocedure Evaluation (Signed)
Anesthesia Evaluation  Patient identified by MRN, date of birth, ID band Patient awake    Airway Mallampati: II  TM Distance: >3 FB Neck ROM: Full    Dental no notable dental hx.    Pulmonary former smoker,    Pulmonary exam normal        Cardiovascular Exercise Tolerance: Good hypertension, +CHF (EF 40%)  Normal cardiovascular exam  FINDINGS 01/2020 Left Ventricle: Left ventricular ejection fraction, by estimation, is 40%%. The left ventricle has moderately decreased function. The left ventricle demonstrates global hypokinesis. The left ventricular internal cavity size was normal in size. There is  mild left ventricular hypertrophy. Left ventricular diastolic parameters are consistent with Grade I diastolic dysfunction (impaired relaxation). Elevated left ventricular end-diastolic pressure.       Neuro/Psych negative psych ROS   GI/Hepatic negative GI ROS, Neg liver ROS,   Endo/Other  diabetes  Renal/GU CRFRenal disease (Stage III CKD)     Musculoskeletal   Abdominal   Peds  Hematology   Anesthesia Other Findings   Reproductive/Obstetrics                             Anesthesia Physical Anesthesia Plan  ASA: III  Anesthesia Plan: MAC   Post-op Pain Management:    Induction: Intravenous  PONV Risk Score and Plan: 2 and Midazolam, TIVA and Treatment may vary due to age or medical condition  Airway Management Planned: Nasal Cannula and Natural Airway  Additional Equipment: None  Intra-op Plan:   Post-operative Plan:   Informed Consent: I have reviewed the patients History and Physical, chart, labs and discussed the procedure including the risks, benefits and alternatives for the proposed anesthesia with the patient or authorized representative who has indicated his/her understanding and acceptance.       Plan Discussed with: CRNA  Anesthesia Plan Comments:          Anesthesia Quick Evaluation

## 2020-07-31 ENCOUNTER — Encounter: Payer: Self-pay | Admitting: Ophthalmology

## 2020-08-01 ENCOUNTER — Other Ambulatory Visit: Payer: Self-pay

## 2020-08-01 ENCOUNTER — Encounter: Payer: Self-pay | Admitting: Ophthalmology

## 2020-08-05 ENCOUNTER — Ambulatory Visit: Payer: Medicare PPO | Admitting: Family Medicine

## 2020-08-05 ENCOUNTER — Other Ambulatory Visit: Payer: Self-pay

## 2020-08-05 DIAGNOSIS — H259 Unspecified age-related cataract: Secondary | ICD-10-CM | POA: Diagnosis not present

## 2020-08-05 DIAGNOSIS — Z794 Long term (current) use of insulin: Secondary | ICD-10-CM | POA: Diagnosis not present

## 2020-08-05 DIAGNOSIS — I739 Peripheral vascular disease, unspecified: Secondary | ICD-10-CM | POA: Diagnosis not present

## 2020-08-05 DIAGNOSIS — N1832 Chronic kidney disease, stage 3b: Secondary | ICD-10-CM | POA: Diagnosis not present

## 2020-08-05 DIAGNOSIS — I10 Essential (primary) hypertension: Secondary | ICD-10-CM | POA: Diagnosis not present

## 2020-08-05 DIAGNOSIS — E1122 Type 2 diabetes mellitus with diabetic chronic kidney disease: Secondary | ICD-10-CM

## 2020-08-05 DIAGNOSIS — I5022 Chronic systolic (congestive) heart failure: Secondary | ICD-10-CM

## 2020-08-05 NOTE — Patient Instructions (Signed)
Nice to see you. Please continue to monitor your blood sugar.  If it worsens please let us or your endocrinologist know.

## 2020-08-05 NOTE — Progress Notes (Signed)
[name], MD Phone: [phone]  Kelsey Mason is a 80 y.o. female who presents today for follow-up.  Diabetes: Typically 70-90 in the morning, 120-290 later in the day.  A1c 6.9.  Continues on Farxiga, glimepiride, Lantus 40 units once daily though occasionally drops that to 30 or 32 units, and Trulicity.  No polyuria or polydipsia.  Very rarely she will have a low sugar and she will feel little weak and shaky.  She will eat some candy or drink a soda.  Hypertension/CHF: Taking carvedilol, Farxiga, and losartan.  She does have propranolol on her medication list.  Denies chest pain or shortness of breath.  Occasional lower extremity swelling that resolves with her Lasix.  No orthopnea or PND.  She is status post left cataract surgery.  She is preparing to have the right cataract completed in the near future.  Social History   Tobacco Use  Smoking Status Former Smoker  . Packs/day: 0.25  . Years: 1.00  . Pack years: 0.25  . Types: Cigarettes  . Quit date: 1963  . Years since quitting: 59.1  Smokeless Tobacco Never Used    Current Outpatient Medications on File Prior to Visit  Medication Sig Dispense Refill  . aspirin 81 MG tablet Take 81 mg by mouth daily.    . blood glucose meter kit and supplies KIT Dispense based on patient and insurance preference. Use up to four times daily as directed. One Touch Ultra mini meter (FOR ICD-9 250.00, 250.01). Dx: E11.9 1 each 0  . dapagliflozin propanediol (FARXIGA) 10 MG TABS tablet Take by mouth.    [name] glimepiride (AMARYL) 2 MG tablet Take 2 mg by mouth daily with breakfast.    . glucose blood (ONE TOUCH ULTRA TEST) test strip USE TO TEST BLOOD SUGAR UP TO 4 TIMES DAILY 100 each 6  . LANTUS SOLOSTAR 100 UNIT/ML Solostar Pen INJECT 20 UNITS INTO THE SKIN DAILY AT 10 PM. (Patient taking differently: Inject 40 Units into the skin at bedtime.) 18 pen 0  . loratadine-pseudoephedrine (CLARITIN-D 24-HOUR) 10-240 MG 24 hr tablet Take 1  tablet by mouth daily.    [name] losartan (COZAAR) 100 MG tablet TAKE 0.5 TABLETS (50 MG TOTAL) BY MOUTH 2 (TWO) TIMES DAILY. 90 tablet 1  . nitroGLYCERIN (NITROSTAT) 0.4 MG SL tablet PLACE 1 TABLET (0.4 MG TOTAL) UNDER THE TONGUE EVERY 5 (FIVE) MINUTES AS NEEDED FOR CHEST PAIN. MAXIMUM OF 3 DOSES. 25 tablet 3  . NOVOFINE 32G X 6 MM MISC USE AS DIRECTED WITH LANTUS PEN 100 each 2  . propranolol ER (INDERAL LA) 60 MG 24 hr capsule Take 1 tablet by mouth daily.    . rosuvastatin (CRESTOR) 20 MG tablet Take 1 tablet (20 mg total) by mouth daily. 90 tablet 3  . TRULICITY 1.5 NG/2.9BM SOPN Inject 1.5 mg as directed once a week.  11  . carvedilol (COREG) 12.5 MG tablet Take 1 tablet (12.5 mg total) by mouth 2 (two) times daily. 180 tablet 2  . cholecalciferol (VITAMIN D) 25 MCG (1000 UT) tablet Take 1,000 Units by mouth daily. (Patient not taking: Reported on 08/05/2020)    . furosemide (LASIX) 20 MG tablet Take 1 tablet (20 mg total) by mouth daily as needed (for >2lb weight gain overnight or 5 lb weight gain in 1 week or swelling.). 30 tablet 6  . vitamin B-12 (CYANOCOBALAMIN) 1000 MCG tablet Take 1,000 mcg by mouth daily. (Patient not taking: Reported on 08/05/2020)     No current  facility-administered medications on file prior to visit.     ROS see history of present illness  Objective  Physical Exam Vitals:   08/06/20 0918  BP: 130/70  Pulse: 67  Temp: 98.3 F (36.8 C)  SpO2: 99%    BP Readings from Last 3 Encounters:  08/06/20 130/70  07/30/20 140/63  05/16/20 (!) 155/68   Wt Readings from Last 3 Encounters:  08/06/20 161 lb (73 kg)  07/30/20 162 lb (73.5 kg)  05/16/20 165 lb 1.6 oz (74.9 kg)    Physical Exam Constitutional:      General: She is not in acute distress.    Appearance: She is not diaphoretic.  Cardiovascular:     Rate and Rhythm: Normal rate and regular rhythm.     Heart sounds: Normal heart sounds.  Pulmonary:     Effort: Pulmonary effort is normal.     Breath  sounds: Normal breath sounds.  Musculoskeletal:        General: No edema.     Right lower leg: No edema.     Left lower leg: No edema.  Skin:    General: Skin is warm and dry.  Neurological:     Mental Status: She is alert.    Diabetic Foot Exam - Simple   Simple Foot Form Diabetic Foot exam was performed with the following findings: Yes 08/05/2020  9:21 AM  Visual Inspection No deformities, no ulcerations, no other skin breakdown bilaterally: Yes Sensation Testing See comments: Yes Pulse Check See comments: Yes Comments Decreased pedal pulses bilaterally, no signs of ischemia, monofilament testing decreased throughout, light touch sensation intact      Assessment/Plan: Please see individual problem list.  Problem List Items Addressed This Visit    Cataracts, bilateral    Status post surgical intervention for the left cataract.  She will continue to see ophthalmology for the right cataract.      Chronic HFrEF (heart failure with reduced ejection fraction) (HCC)    Seems to be euvolemic.  Weight is relatively stable.  She will monitor for any worsening symptoms.  She will confirm whether or not she is taking propranolol.  Advised to let us know.  She will continue carvedilol 12.5 mg twice daily, fark Sica 10 mg daily, Lasix 20 mg daily as needed, and losartan 100 mg daily.      Essential hypertension (Chronic)    Adequately controlled.  She will continue carvedilol 12.5 mg twice daily, losartan 100 mg daily.      Peripheral vascular disease, unspecified ([place])    She will continue risk factor management.  She follows with cardiology for this.      Type 2 diabetes mellitus with stage 3 chronic kidney disease, with long-term current use of insulin (HCC)    Seems to be well controlled based on most recent A1c.  She will continue glimepiride 2 mg daily, Farxiga 10 mg daily, Lantus 40 units daily, and Trulicity 1.5 mg weekly.         This visit occurred during the  SARS-CoV-2 public health emergency.  Safety protocols were in place, including screening questions prior to the visit, additional usage of staff PPE, and extensive cleaning of exam room while observing appropriate contact time as indicated for disinfecting solutions.    [name], MD [place]

## 2020-08-06 DIAGNOSIS — E1122 Type 2 diabetes mellitus with diabetic chronic kidney disease: Secondary | ICD-10-CM | POA: Diagnosis not present

## 2020-08-06 DIAGNOSIS — E1159 Type 2 diabetes mellitus with other circulatory complications: Secondary | ICD-10-CM | POA: Diagnosis not present

## 2020-08-06 DIAGNOSIS — E1129 Type 2 diabetes mellitus with other diabetic kidney complication: Secondary | ICD-10-CM | POA: Diagnosis not present

## 2020-08-06 DIAGNOSIS — N1832 Chronic kidney disease, stage 3b: Secondary | ICD-10-CM | POA: Diagnosis not present

## 2020-08-06 DIAGNOSIS — Z794 Long term (current) use of insulin: Secondary | ICD-10-CM | POA: Diagnosis not present

## 2020-08-06 DIAGNOSIS — H269 Unspecified cataract: Secondary | ICD-10-CM | POA: Insufficient documentation

## 2020-08-06 DIAGNOSIS — R809 Proteinuria, unspecified: Secondary | ICD-10-CM | POA: Diagnosis not present

## 2020-08-06 DIAGNOSIS — I10 Essential (primary) hypertension: Secondary | ICD-10-CM | POA: Diagnosis not present

## 2020-08-06 NOTE — Assessment & Plan Note (Addendum)
Seems to be euvolemic.  Weight is relatively stable.  She will monitor for any worsening symptoms.  She will confirm whether or not she is taking propranolol.  Advised to let us know.  She will continue carvedilol 12.5 mg twice daily, fark Sica 10 mg daily, Lasix 20 mg daily as needed, and losartan 100 mg daily.

## 2020-08-06 NOTE — Assessment & Plan Note (Signed)
Adequately controlled.  She will continue carvedilol 12.5 mg twice daily, losartan 100 mg daily.

## 2020-08-06 NOTE — Assessment & Plan Note (Signed)
She will continue risk factor management.  She follows with cardiology for this.

## 2020-08-06 NOTE — Assessment & Plan Note (Signed)
Status post surgical intervention for the left cataract.  She will continue to see ophthalmology for the right cataract.

## 2020-08-06 NOTE — Assessment & Plan Note (Signed)
Seems to be well controlled based on most recent A1c.  She will continue glimepiride 2 mg daily, Farxiga 10 mg daily, Lantus 40 units daily, and Trulicity 1.5 mg weekly.

## 2020-08-11 ENCOUNTER — Other Ambulatory Visit: Payer: Self-pay

## 2020-08-11 ENCOUNTER — Other Ambulatory Visit
Admission: RE | Admit: 2020-08-11 | Discharge: 2020-08-11 | Disposition: A | Discharge status: Home / Self Care | Payer: Medicare PPO | Source: Ambulatory Visit | Attending: Ophthalmology | Admitting: Ophthalmology

## 2020-08-11 DIAGNOSIS — Z01812 Encounter for preprocedural laboratory examination: Secondary | ICD-10-CM | POA: Diagnosis not present

## 2020-08-11 DIAGNOSIS — Z20822 Contact with and (suspected) exposure to covid-19: Secondary | ICD-10-CM | POA: Insufficient documentation

## 2020-08-11 DIAGNOSIS — H2511 Age-related nuclear cataract, right eye: Secondary | ICD-10-CM | POA: Diagnosis not present

## 2020-08-11 DIAGNOSIS — E11311 Type 2 diabetes mellitus with unspecified diabetic retinopathy with macular edema: Secondary | ICD-10-CM | POA: Diagnosis not present

## 2020-08-11 LAB — SARS CORONAVIRUS 2 (TAT 6-24 HRS): SARS Coronavirus 2: NEGATIVE

## 2020-08-11 NOTE — Discharge Instructions (Signed)

## 2020-08-13 ENCOUNTER — Ambulatory Visit: Payer: Medicare PPO | Admitting: Anesthesiology

## 2020-08-13 ENCOUNTER — Encounter: Payer: Self-pay | Admitting: Ophthalmology

## 2020-08-13 ENCOUNTER — Ambulatory Visit: Payer: Medicare PPO | Admitting: Internal Medicine

## 2020-08-13 ENCOUNTER — Ambulatory Visit
Admission: RE | Admit: 2020-08-13 | Discharge: 2020-08-13 | Disposition: A | Discharge status: Home / Self Care | Payer: Medicare PPO | Attending: Ophthalmology | Admitting: Ophthalmology

## 2020-08-13 ENCOUNTER — Other Ambulatory Visit: Payer: Self-pay

## 2020-08-13 ENCOUNTER — Encounter
Admission: RE | Disposition: A | Discharge status: Home / Self Care | Payer: Self-pay | Source: Home / Self Care | Attending: Ophthalmology

## 2020-08-13 DIAGNOSIS — H25811 Combined forms of age-related cataract, right eye: Secondary | ICD-10-CM | POA: Diagnosis not present

## 2020-08-13 DIAGNOSIS — Z7982 Long term (current) use of aspirin: Secondary | ICD-10-CM | POA: Insufficient documentation

## 2020-08-13 DIAGNOSIS — Z886 Allergy status to analgesic agent status: Secondary | ICD-10-CM | POA: Diagnosis not present

## 2020-08-13 DIAGNOSIS — E1136 Type 2 diabetes mellitus with diabetic cataract: Secondary | ICD-10-CM | POA: Insufficient documentation

## 2020-08-13 DIAGNOSIS — Z79899 Other long term (current) drug therapy: Secondary | ICD-10-CM | POA: Insufficient documentation

## 2020-08-13 DIAGNOSIS — Z794 Long term (current) use of insulin: Secondary | ICD-10-CM | POA: Insufficient documentation

## 2020-08-13 DIAGNOSIS — Z7984 Long term (current) use of oral hypoglycemic drugs: Secondary | ICD-10-CM | POA: Diagnosis not present

## 2020-08-13 DIAGNOSIS — Z888 Allergy status to other drugs, medicaments and biological substances status: Secondary | ICD-10-CM | POA: Diagnosis not present

## 2020-08-13 DIAGNOSIS — Z880 Allergy status to penicillin: Secondary | ICD-10-CM | POA: Diagnosis not present

## 2020-08-13 DIAGNOSIS — H2511 Age-related nuclear cataract, right eye: Secondary | ICD-10-CM | POA: Diagnosis not present

## 2020-08-13 HISTORY — PX: CATARACT EXTRACTION W/PHACO: SHX586

## 2020-08-13 LAB — GLUCOSE, CAPILLARY
Glucose-Capillary: 140 mg/dL — ABNORMAL HIGH (ref 70–99)
Glucose-Capillary: 143 mg/dL — ABNORMAL HIGH (ref 70–99)

## 2020-08-13 SURGERY — PHACOEMULSIFICATION, CATARACT, WITH IOL INSERTION
Anesthesia: Monitor Anesthesia Care | Site: Eye | Laterality: Right

## 2020-08-13 MED ORDER — TETRACAINE HCL 0.5 % OP SOLN
1.0000 [drp] | OPHTHALMIC | Status: DC | PRN
  Administered 2020-08-13 (×3): 1 [drp] via OPHTHALMIC

## 2020-08-13 MED ORDER — BRIMONIDINE TARTRATE-TIMOLOL 0.2-0.5 % OP SOLN
OPHTHALMIC | Status: DC | PRN
  Administered 2020-08-13: 1 [drp] via OPHTHALMIC

## 2020-08-13 MED ORDER — MIDAZOLAM HCL 2 MG/2ML IJ SOLN
INTRAMUSCULAR | Status: DC | PRN
  Administered 2020-08-13: 1 mg via INTRAVENOUS

## 2020-08-13 MED ORDER — ACETAMINOPHEN 160 MG/5ML PO SOLN: 325.0000 mg | ORAL | Status: DC | PRN

## 2020-08-13 MED ORDER — ONDANSETRON HCL 4 MG/2ML IJ SOLN: 4.0000 mg | Freq: Once | INTRAMUSCULAR | Status: DC | PRN

## 2020-08-13 MED ORDER — MOXIFLOXACIN HCL 0.5 % OP SOLN
OPHTHALMIC | Status: DC | PRN
  Administered 2020-08-13: 0.2 mL via OPHTHALMIC

## 2020-08-13 MED ORDER — EPINEPHRINE PF 1 MG/ML IJ SOLN
INTRAOCULAR | Status: DC | PRN
  Administered 2020-08-13: 97 mL via OPHTHALMIC

## 2020-08-13 MED ORDER — LIDOCAINE HCL (PF) 2 % IJ SOLN
INTRAOCULAR | Status: DC | PRN
  Administered 2020-08-13: 1 mL

## 2020-08-13 MED ORDER — ACETAMINOPHEN 325 MG PO TABS: 325.0000 mg | ORAL_TABLET | ORAL | Status: DC | PRN

## 2020-08-13 MED ORDER — ARMC OPHTHALMIC DILATING DROPS
1.0000 "application " | OPHTHALMIC | Status: DC | PRN
  Administered 2020-08-13 (×3): 1 via OPHTHALMIC

## 2020-08-13 MED ORDER — NA HYALUR & NA CHOND-NA HYALUR 0.4-0.35 ML IO KIT
PACK | INTRAOCULAR | Status: DC | PRN
  Administered 2020-08-13: 1 mL via INTRAOCULAR

## 2020-08-13 MED ORDER — LACTATED RINGERS IV SOLN: INTRAVENOUS | Status: DC

## 2020-08-13 MED ORDER — FENTANYL CITRATE (PF) 100 MCG/2ML IJ SOLN
INTRAMUSCULAR | Status: DC | PRN
  Administered 2020-08-13: 50 ug via INTRAVENOUS

## 2020-08-13 SURGICAL SUPPLY — 28 items
CANNULA ANT/CHMB 27G (MISCELLANEOUS) ×1 IMPLANT
CANNULA ANT/CHMB 27GA (MISCELLANEOUS) ×2 IMPLANT
GLOVE SURG LX 7.5 STRW (GLOVE) ×2
GLOVE SURG LX STRL 7.5 STRW (GLOVE) ×1 IMPLANT
GLOVE SURG TRIUMPH 8.0 PF LTX (GLOVE) ×2 IMPLANT
GOWN STRL REUS W/ TWL LRG LVL3 (GOWN DISPOSABLE) ×2 IMPLANT
GOWN STRL REUS W/TWL LRG LVL3 (GOWN DISPOSABLE) ×4
LENS IOL TECNIS EYHANCE 20.0 (Intraocular Lens) ×1 IMPLANT
MARKER SKIN DUAL TIP RULER LAB (MISCELLANEOUS) ×2 IMPLANT
NDL CAPSULORHEX 25GA (NEEDLE) ×1 IMPLANT
NDL FILTER BLUNT 18X1 1/2 (NEEDLE) ×2 IMPLANT
NDL RETROBULBAR .5 NSTRL (NEEDLE) IMPLANT
NEEDLE CAPSULORHEX 25GA (NEEDLE) ×2 IMPLANT
NEEDLE FILTER BLUNT 18X 1/2SAF (NEEDLE) ×2
NEEDLE FILTER BLUNT 18X1 1/2 (NEEDLE) ×2 IMPLANT
PACK CATARACT BRASINGTON (MISCELLANEOUS) ×2 IMPLANT
PACK EYE AFTER SURG (MISCELLANEOUS) ×2 IMPLANT
PACK OPTHALMIC (MISCELLANEOUS) ×2 IMPLANT
RING MALYGIN 7.0 (MISCELLANEOUS) IMPLANT
SOLUTION OPHTHALMIC SALT (MISCELLANEOUS) ×2 IMPLANT
SUT ETHILON 10-0 CS-B-6CS-B-6 (SUTURE)
SUT VICRYL  9 0 (SUTURE)
SUT VICRYL 9 0 (SUTURE) IMPLANT
SUTURE EHLN 10-0 CS-B-6CS-B-6 (SUTURE) IMPLANT
SYR 3ML LL SCALE MARK (SYRINGE) ×4 IMPLANT
SYR TB 1ML LUER SLIP (SYRINGE) ×2 IMPLANT
WATER STERILE IRR 250ML POUR (IV SOLUTION) ×2 IMPLANT
WIPE NON LINTING 3.25X3.25 (MISCELLANEOUS) ×2 IMPLANT

## 2020-08-13 NOTE — Anesthesia Postprocedure Evaluation (Signed)
Anesthesia Post Note  Patient: Kelsey Mason  Procedure(s) Performed: CATARACT EXTRACTION PHACO AND INTRAOCULAR LENS PLACEMENT (IOC) RIGHT DIABETIC (Right Eye)     Patient location during evaluation: PACU Anesthesia Type: MAC Level of consciousness: awake and alert Pain management: pain level controlled Vital Signs Assessment: post-procedure vital signs reviewed and stable Respiratory status: spontaneous breathing, nonlabored ventilation, respiratory function stable and patient connected to nasal cannula oxygen Cardiovascular status: blood pressure returned to baseline and stable Postop Assessment: no apparent nausea or vomiting Anesthetic complications: no   No complications documented.  [name]

## 2020-08-13 NOTE — Anesthesia Preprocedure Evaluation (Signed)
Anesthesia Evaluation  Patient identified by MRN, date of birth, ID band Patient awake    Airway Mallampati: II  TM Distance: >3 FB Neck ROM: Full    Dental no notable dental hx.    Pulmonary former smoker,    Pulmonary exam normal breath sounds clear to auscultation       Cardiovascular Exercise Tolerance: Good hypertension, + CAD, + Peripheral Vascular Disease and +CHF (EF 40%)  Normal cardiovascular exam Rhythm:Regular Rate:Normal  FINDINGS 01/2020 Left Ventricle: Left ventricular ejection fraction, by estimation, is 40%%. The left ventricle has moderately decreased function. The left ventricle demonstrates global hypokinesis. The left ventricular internal cavity size was normal in size. There is  mild left ventricular hypertrophy. Left ventricular diastolic parameters are consistent with Grade I diastolic dysfunction (impaired relaxation). Elevated left ventricular end-diastolic pressure.       Neuro/Psych PSYCHIATRIC DISORDERS Anxiety    GI/Hepatic Neg liver ROS, GERD  ,  Endo/Other  diabetes  Renal/GU CRFRenal disease (Stage III CKD)     Musculoskeletal  (+) Arthritis ,   Abdominal Normal abdominal exam  (+) - obese,  Abdomen: soft.    Peds  Hematology  (+) Blood dyscrasia, anemia ,   Anesthesia Other Findings   Reproductive/Obstetrics                             Anesthesia Physical  Anesthesia Plan  ASA: III  Anesthesia Plan: MAC   Post-op Pain Management:    Induction: Intravenous  PONV Risk Score and Plan: 2 and Midazolam, TIVA and Treatment may vary due to age or medical condition  Airway Management Planned: Nasal Cannula and Natural Airway  Additional Equipment: None  Intra-op Plan:   Post-operative Plan:   Informed Consent: I have reviewed the patients History and Physical, chart, labs and discussed the procedure including the risks, benefits and alternatives  for the proposed anesthesia with the patient or authorized representative who has indicated his/her understanding and acceptance.       Plan Discussed with: CRNA  Anesthesia Plan Comments:         Anesthesia Quick Evaluation  Patient Active Problem List   Diagnosis Date Noted  . Cataracts, bilateral 08/06/2020  . Cough 11/12/2019  . Right upper quadrant pain 11/06/2019  . Balance problem 11/06/2019  . Chronic bilateral low back pain without sciatica 04/17/2019  . Aromatase inhibitor use 03/24/2019  . Osteopenia 03/24/2019  . Peripheral vascular disease, unspecified ([place]) 05/19/2018  . Coronary artery disease involving native coronary artery of native heart without angina pectoris 05/16/2018  . Tremor 05/16/2018  . Dyspnea on exertion 03/02/2018  . Fatigue 03/02/2018  . Decreased pedal pulses 01/23/2018  . Leukocytosis 01/23/2018  . Lipoma of chest wall 12/17/2017  . Left knee pain 10/26/2017  . Subcutaneous nodule 10/26/2017  . Malignant neoplasm of right breast in female, estrogen receptor positive ([place]) 09/12/2017  . Goals of care, counseling/discussion 09/12/2017  . Pain in both lower extremities 08/31/2017  . Renal artery stenosis ([place]) 04/27/2017  . Cerumen impaction 03/24/2017  . NICM (nonischemic cardiomyopathy) ([place]) 01/25/2017  . Epigastric abdominal pain 11/17/2016  . Non-rheumatic mitral regurgitation 06/10/2016  . Left shoulder pain 06/03/2016  . Type 2 diabetes mellitus with stage 3 chronic kidney disease, with long-term current use of insulin ([place]) 02/27/2016  . Chronic HFrEF (heart failure with reduced ejection fraction) ([place]) 01/30/2016  . Chronic kidney disease, stage III (moderate) ([place]) 03/25/2015  . Claudication ([place])  12/16/2014  . Generalized anxiety disorder 06/18/2014  . Normocytic anemia 02/07/2014  . Mitral valve disease 12/20/2013  . Allergic rhinitis 10/17/2013  . Neuropathy due to chemotherapeutic drug ([place]) 09/06/2013  . Tinnitus  05/24/2013  . Insomnia 04/26/2013  . History of breast cancer 10/27/2012  . Localized swelling, mass and lump, trunk 10/02/2012  . Essential hypertension 12/29/2011  . Hyperlipidemia LDL goal <70 12/29/2011    CBC Latest Ref Rng & Units 05/16/2020 12/24/2019 06/19/2019  WBC 4.0 - 10.5 K/uL 9.4 10.0 9.7  Hemoglobin 12.0 - 15.0 g/dL 11.5(L) 11.5(L) 11.9(L)  Hematocrit 36.0 - 46.0 % 33.8(L) 33.1(L) 35.7(L)  Platelets 150 - 400 K/uL 158 197 211   BMP Latest Ref Rng & Units 05/16/2020 03/20/2020 12/24/2019  Glucose 70 - 99 mg/dL 116(H) 121(H) 257(H)  BUN 8 - 23 mg/dL _0 Creatinine 0.44 - 1.00 mg/dL 1.56(H) 1.29(H) 1.81(H)  BUN/Creat Ratio 12 - 28 - - -  Sodium 135 - 145 mmol/L 135 135 135  Potassium 3.5 - 5.1 mmol/L 3.8 4.5 3.3(L)  Chloride 98 - 111 mmol/L 103 101 96(L)  CO2 22 - 32 mmol/L _1 Calcium 8.9 - 10.3 mg/dL 9.0 9.3 8.8(L)    Risks and benefits of anesthesia discussed at length, patient or surrogate demonstrates understanding. Appropriately NPO. Plan to proceed with anesthesia.  Champ Mungo, MD 08/13/20

## 2020-08-13 NOTE — Transfer of Care (Signed)
Immediate Anesthesia Transfer of Care Note  Patient: Kelsey Mason  Procedure(s) Performed: CATARACT EXTRACTION PHACO AND INTRAOCULAR LENS PLACEMENT (IOC) RIGHT DIABETIC (Right Eye)  Patient Location: PACU  Anesthesia Type: MAC  Level of Consciousness: awake, alert  and patient cooperative  Airway and Oxygen Therapy: Patient Spontanous Breathing  Post-op Assessment: Post-op Vital signs reviewed, Patient's Cardiovascular Status Stable, Respiratory Function Stable, Patent Airway and No signs of Nausea or vomiting  Post-op Vital Signs: Reviewed and stable  Complications: No complications documented.

## 2020-08-13 NOTE — Anesthesia Procedure Notes (Signed)
Procedure Name: MAC Date/Time: 08/13/2020 7:46 AM Performed by: Vanetta Shawl, CRNA Pre-anesthesia Checklist: Patient identified, Emergency Drugs available, Suction available, Timeout performed and Patient being monitored Patient Re-evaluated:Patient Re-evaluated prior to induction Oxygen Delivery Method: Nasal cannula Placement Confirmation: positive ETCO2

## 2020-08-13 NOTE — Op Note (Signed)
LOCATION:  [place]   PREOPERATIVE DIAGNOSIS:    Nuclear sclerotic cataract right eye. H25.11   POSTOPERATIVE DIAGNOSIS:  Nuclear sclerotic cataract right eye.     PROCEDURE:  Phacoemusification with posterior chamber intraocular lens placement of the right eye   ULTRASOUND TIME: Procedure(s) with comments: CATARACT EXTRACTION PHACO AND INTRAOCULAR LENS PLACEMENT (IOC) RIGHT DIABETIC (Right) - 10.67 1:27.8 12.1%  LENS:   Implant Name Type Inv. Item Serial No. Manufacturer Lot No. LRB No. Used Action  LENS IOL TECNIS EYHANCE 20.0 - [id number] Intraocular Lens LENS IOL TECNIS EYHANCE 20.0 [id number]   Right 1 Implanted         SURGEON:  Wyonia Hough, MD   ANESTHESIA:  Topical with tetracaine drops and 2% Xylocaine jelly, augmented with 1% preservative-free intracameral lidocaine.    COMPLICATIONS:  None.   DESCRIPTION OF PROCEDURE:  The patient was identified in the holding room and transported to the operating room and placed in the supine position under the operating microscope.  The right eye was identified as the operative eye and it was prepped and draped in the usual sterile ophthalmic fashion.   A 1 millimeter clear-corneal paracentesis was made at the 12:00 position.  0.5 ml of preservative-free 1% lidocaine was injected into the anterior chamber. The anterior chamber was filled with Viscoat viscoelastic.  A 2.4 millimeter keratome was used to make a near-clear corneal incision at the 9:00 position.  A curvilinear capsulorrhexis was made with a cystotome and capsulorrhexis forceps.  Balanced salt solution was used to hydrodissect and hydrodelineate the nucleus.   Phacoemulsification was then used in stop and chop fashion to remove the lens nucleus and epinucleus.  The remaining cortex was then removed using the irrigation and aspiration handpiece. Provisc was then placed into the capsular bag to distend it for lens placement.  A lens was then  injected into the capsular bag.  The remaining viscoelastic was aspirated.   Wounds were hydrated with balanced salt solution.  The anterior chamber was inflated to a physiologic pressure with balanced salt solution.  No wound leaks were noted.Vigamox 0.2 ml of a 1mg  per ml solution was injected into the anterior chamber for a dose of 0.2 mg of intracameral antibiotic at the completion of the case.   Timolol and Brimonidine drops were applied to the eye.  The patient was taken to the recovery room in stable condition without complications of anesthesia or surgery.   Maudell Stanbrough 08/13/2020, 8:06 AM

## 2020-08-13 NOTE — H&P (Signed)

## 2020-09-03 DIAGNOSIS — N179 Acute kidney failure, unspecified: Secondary | ICD-10-CM | POA: Diagnosis not present

## 2020-09-03 DIAGNOSIS — E1129 Type 2 diabetes mellitus with other diabetic kidney complication: Secondary | ICD-10-CM | POA: Diagnosis not present

## 2020-09-03 DIAGNOSIS — I10 Essential (primary) hypertension: Secondary | ICD-10-CM | POA: Diagnosis not present

## 2020-09-03 DIAGNOSIS — N1832 Chronic kidney disease, stage 3b: Secondary | ICD-10-CM | POA: Diagnosis not present

## 2020-09-09 ENCOUNTER — Other Ambulatory Visit: Payer: Self-pay | Admitting: Family Medicine

## 2020-09-20 IMAGING — MG DIGITAL SCREENING BILAT W/ TOMO W/ CAD
8 series · 8 of 24 positions shown · non-contrast
Comparison: Previous exam(s).

CLINICAL DATA: Screening.

EXAM:
DIGITAL SCREENING BILATERAL MAMMOGRAM WITH TOMO AND CAD

[R MLO synth-2D]
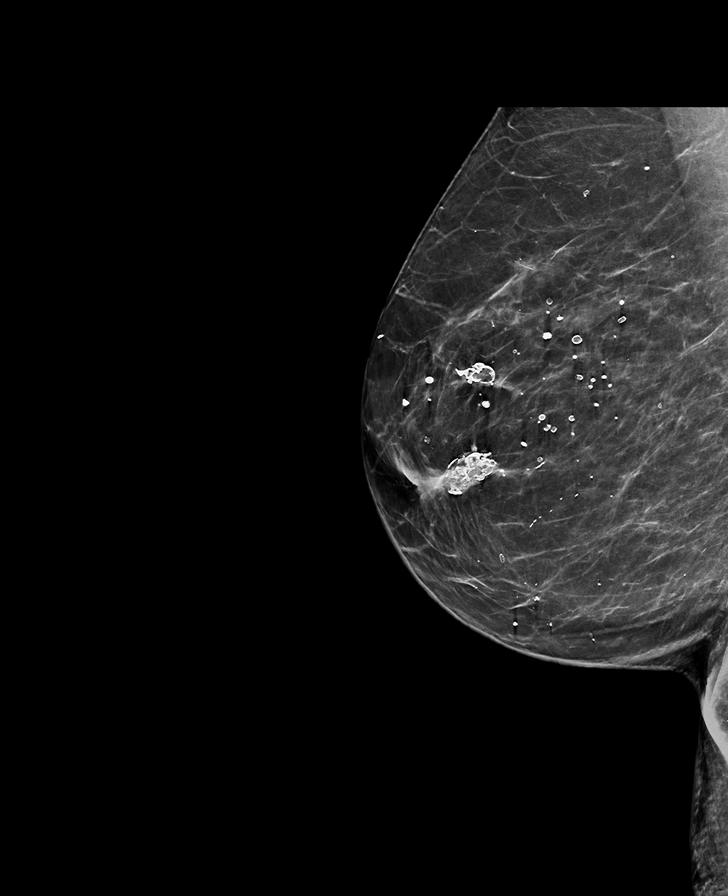

[L MLO synth-2D]
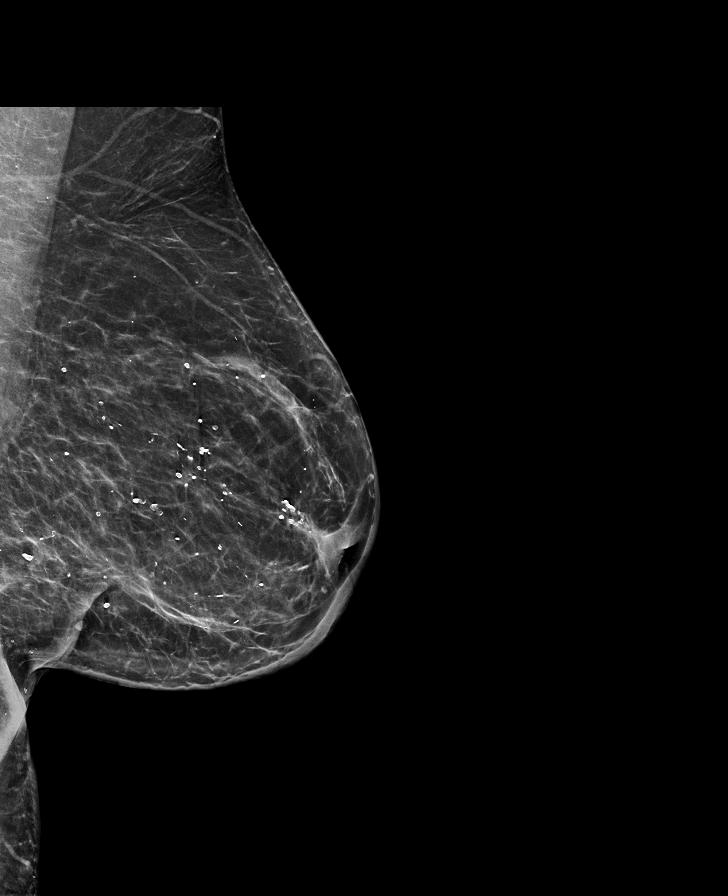

[R CC synth-2D]
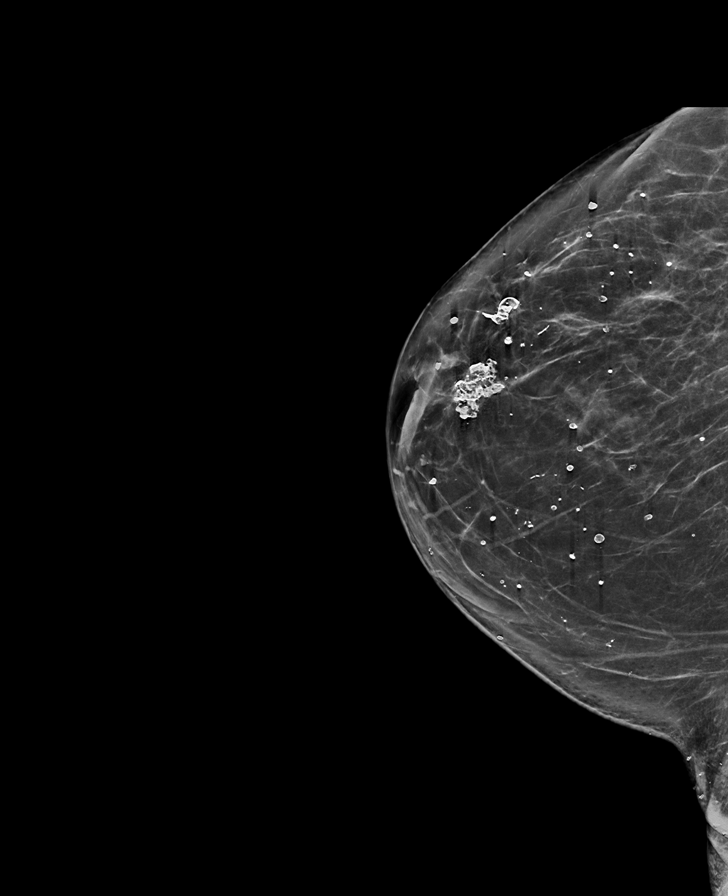

[L CC synth-2D]
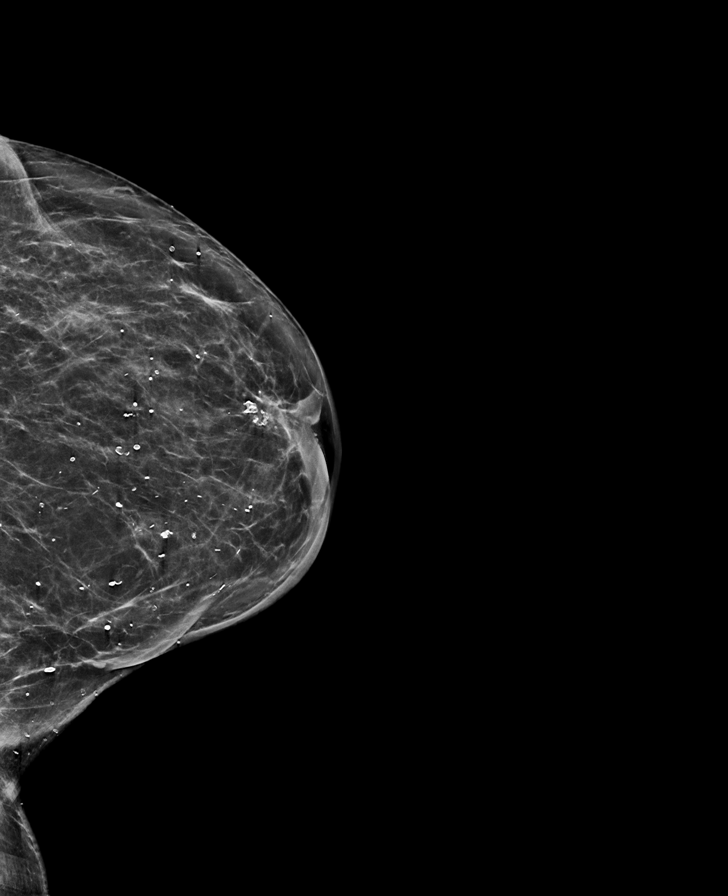

[L MLO tomo · tomo slice 36/71.0]
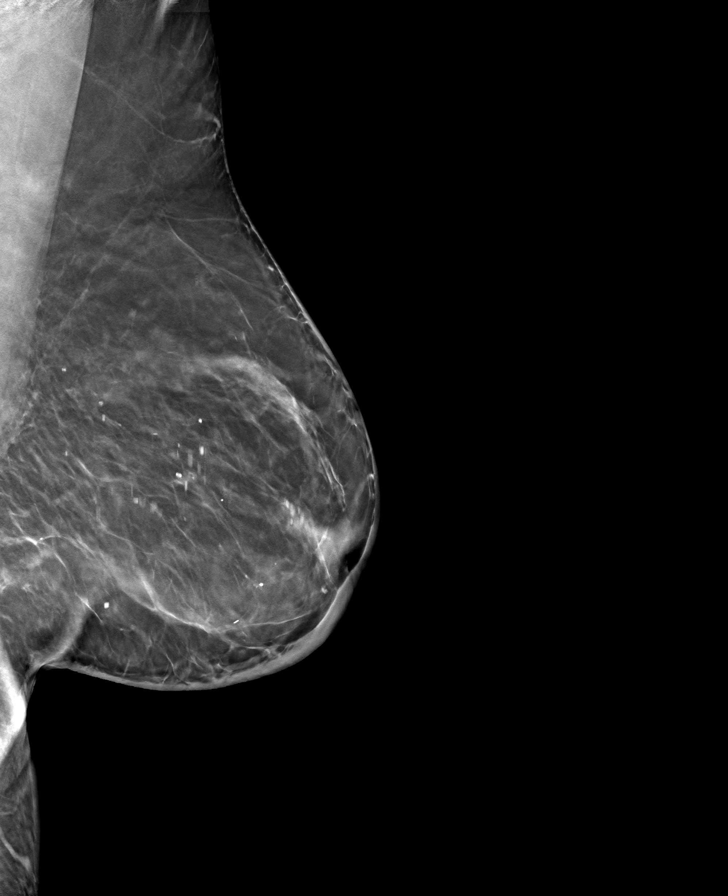

[R CC tomo · tomo slice 38/75.0]
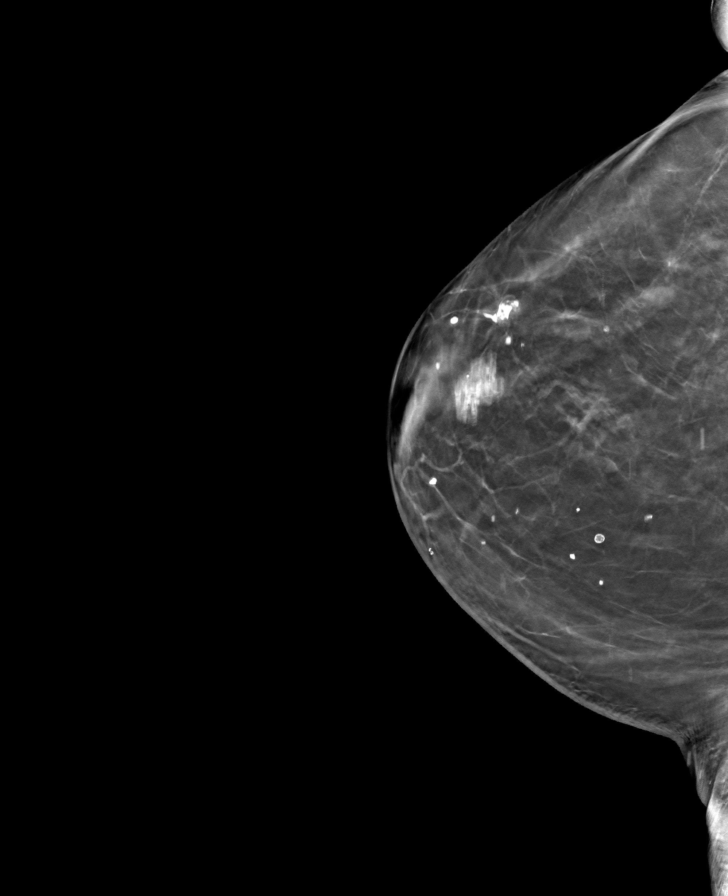

[L CC tomo · tomo slice 37/72.0]
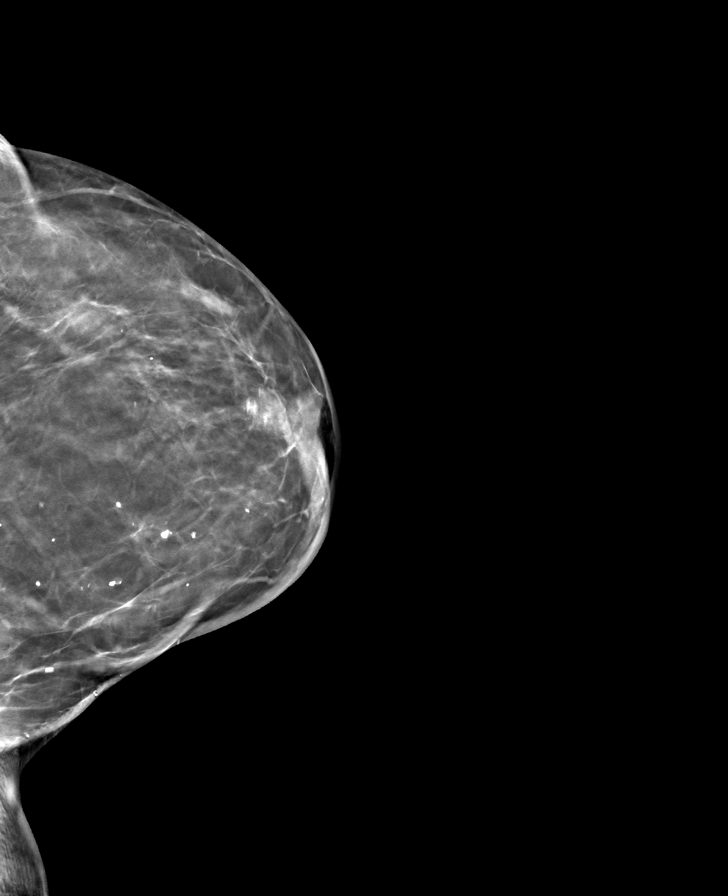

[R MLO tomo · tomo slice 33/65.0]
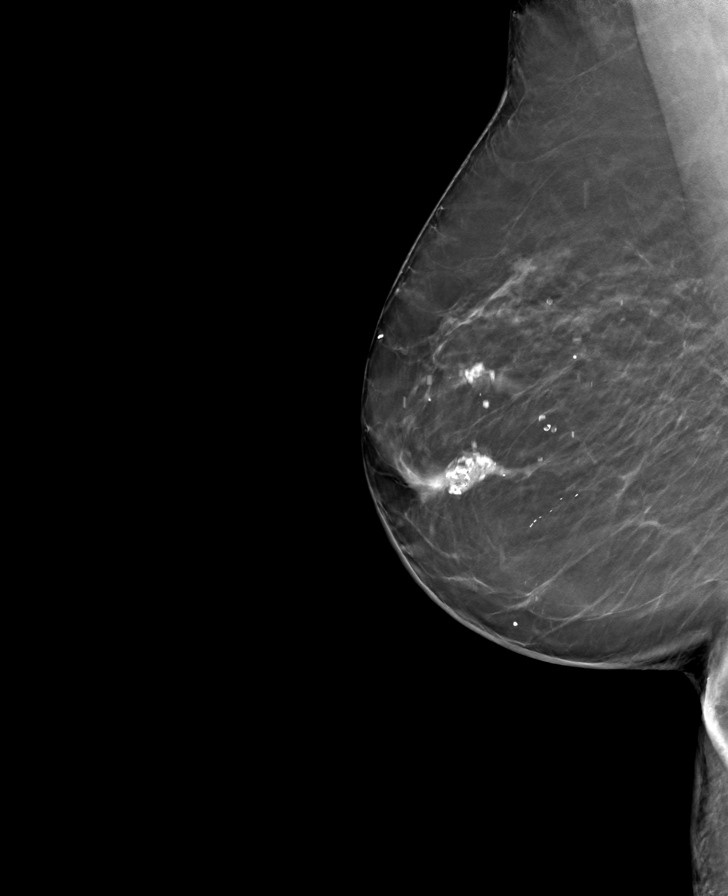

[8 of 24 positions shown; findings below may reference images not displayed]

ACR Breast Density Category b: There are scattered areas of
fibroglandular density.
FINDINGS: There are no findings suspicious for malignancy. Images were
processed with CAD.
IMPRESSION: No mammographic evidence of malignancy. A result letter of this
screening mammogram will be mailed directly to the patient.

RECOMMENDATION:
Screening mammogram in one year. (Code:[id number])

BI-RADS CATEGORY  1: Negative.

## 2020-09-26 ENCOUNTER — Ambulatory Visit: Payer: Medicare PPO | Admitting: Internal Medicine

## 2020-09-26 ENCOUNTER — Encounter: Payer: Self-pay | Admitting: Internal Medicine

## 2020-09-26 ENCOUNTER — Other Ambulatory Visit: Payer: Self-pay

## 2020-09-26 VITALS — BP 130/60 | HR 71 | Ht 62.5 in | Wt 159.4 lb

## 2020-09-26 DIAGNOSIS — I10 Essential (primary) hypertension: Secondary | ICD-10-CM

## 2020-09-26 DIAGNOSIS — E1169 Type 2 diabetes mellitus with other specified complication: Secondary | ICD-10-CM | POA: Insufficient documentation

## 2020-09-26 DIAGNOSIS — I5022 Chronic systolic (congestive) heart failure: Secondary | ICD-10-CM | POA: Diagnosis not present

## 2020-09-26 DIAGNOSIS — I2510 Atherosclerotic heart disease of native coronary artery without angina pectoris: Secondary | ICD-10-CM | POA: Diagnosis not present

## 2020-09-26 DIAGNOSIS — I428 Other cardiomyopathies: Secondary | ICD-10-CM | POA: Diagnosis not present

## 2020-09-26 DIAGNOSIS — E785 Hyperlipidemia, unspecified: Secondary | ICD-10-CM | POA: Diagnosis not present

## 2020-09-26 DIAGNOSIS — N183 Chronic kidney disease, stage 3 unspecified: Secondary | ICD-10-CM | POA: Diagnosis not present

## 2020-09-26 NOTE — Patient Instructions (Signed)
Medication Instructions:  Your physician has recommended you make the following change in your medication:  1- STOP Propranolol.  ** Let us know if your blood pressure is running consistently greater than 130/80.  *If you need a refill on your cardiac medications before your next appointment, please call your pharmacy*  Follow-Up: At [facility], you and your health needs are our priority.  As part of our continuing mission to provide you with exceptional heart care, we have created designated Provider Care Teams.  These Care Teams include your primary Cardiologist (physician) and Advanced Practice Providers (APPs -  Physician Assistants and Nurse Practitioners) who all work together to provide you with the care you need, when you need it.  We recommend signing up for the patient portal called "MyChart".  Sign up information is provided on this After Visit Summary.  MyChart is used to connect with patients for Virtual Visits (Telemedicine).  Patients are able to view lab/test results, encounter notes, upcoming appointments, etc.  Non-urgent messages can be sent to your provider as well.   To learn more about what you can do with MyChart, go to [email or web address].    Your next appointment:   6 month(s)  The format for your next appointment:   In Person  Provider:   You may see Nelva Bush, MD or one of the following Advanced Practice Providers on your designated Care Team:    Murray Hodgkins, NP  Christell Faith, PA-C  Marrianne Mood, PA-C  Cadence Navy, [place]  Laurann Montana, NP

## 2020-09-26 NOTE — Progress Notes (Signed)
Follow-up Outpatient Visit Date: 09/26/2020  Primary Care Provider: Leone Haven, MD [address] [place] [address]  Chief Complaint: Follow-up cardiomyopathy  HPI:  Kelsey Mason is a 80 y.o. female with history of nonobstructive coronary artery disease, peripheral vascular disease with small vessel disease involving the runoff vessels, nonischemic cardiomyopathy, mitral regurgitation, hypertension, hyperlipidemia, type 2 diabetes mellitus, mild to moderate renal artery stenosis, and breast cancer, who presents for follow-up of coronary artery disease and cardiomyopathy.  I last saw Kelsey Mason in 02/2020, at which time she was feeling well other than chronic low back pain.  She also noted frequent dizziness that she attributed to some of her medications.  She had previously been switched from carvedilol to propranolol in an attempt to treat tremor.  Due to lack of improvement with this medication change, we agreed to switch back to carvedilol in an effort to optimize her goal-directed medical therapy for her heart failure.  Today, Kelsey Mason reports that she has been feeling fairly well.  She underwent cataract surgery in January and February and feels like her vision is much better.  She notices some lability in her blood pressure and blood sugar around the time of the surgeries, though both seem to be back to baseline.  Home BP's are typically low normal.  She feels a little off balance at time, especially when doing lots of twirls when dancing.  She otherwise denies lightheadedness, as well as chest pain, shortness of breath, palpitations, edema, and claudication.  Activity is somewhat limited by back pain.  --------------------------------------------------------------------------------------------------  Cardiovascular History & Procedures: Cardiovascular Problems:  Non-ischemic cardiomyopathy  Mitral regurgitation  Claudication  Risk Factors:  HTN, HLD, DM2,  and age > 73  Cath/PCI:  LHC (04/21/2018): LMCA normal. LAD with 50% mid vessel stenosis. LCx proper without disease. Dominant OM3 with 50-60% proximal stenosis. RCA with 50% ostial stenosis. Mildly elevated LVEDP (15 to 20 mmHg).  Abdominal aortogram and runoff (04/21/2018): Aorta, inflow vessels, and outflow vessels. There is tapering/occlusion of the distal anterior tibial and peroneal arteries on the right. There is three-vessel runoff on the left.  CV Surgery:  None  EP Procedures and Devices:  None  Non-Invasive Evaluation(s):  TTE (01/30/2020): Normal LV size with LVEF 40% and global hypokinesis.  Grade 1 diastolic dysfunction with elevated filling pressure.  Low normal RV function with normal size and wall thickness.  Mild left atrial enlargement.  Mild to moderate mitral regurgitation.  TTE (03/14/18): Mildly dilated LV with LVEF 40-45%. Global hypokinesis with grade 2 diastolic dysfunction. Moderate MR and mild LA enlargement. Normal RV size and function.  ABIs (02/22/2018): Noncompressible runoff vessels bilaterally with triphasic waveforms. TBI's are normal bilaterally.  Renal artery Doppler (04/20/17): Mild to moderate (less than 60%) stenosis of the right renal artery. No significant left renal artery disease.  TTE (01/14/17): Normal LV size with LVEF 50-55%. Anteroseptal hypokinesis is noted, as well as grade 1 diastolic dysfunction. Mild to moderate MR. Normal RV size and function. Mild pulmonary hypertension.  Pharmacologic myocardial perfusion stress test (06/16/16): Low risk study with moderate in size, moderate in severity, fixed defect involving the septum most likely related to LBBB. No evidence of ischemia. LVEF 50% by automated calculation and likely higher based on visual estimation.  ABIs (06/10/16): ABIs: Right not obtainable, left 1.4. TBIs right 1.1, left not obtainable. Bilateral great toe PPG's are normal. Bilateral common femoral, popliteal,  peroneal, anterior tibial, and posterior tibial artery waveforms are brisk and  triphasic.  TTE (02/23/16, [facility]): Mildly dilated left ventricle with mild to moderate LV dysfunction (EF 35-45%). Grade 1 diastolic dysfunction. Moderate mitral regurgitation. Mild left atrial enlargement. Normal RV function.  TTE (01/01/14, [facility]): Moderate to severe LV dysfunction (EF 30%) with mild LVH and mild left ventricular dilation. Mitral annular calcification with moderate MR and moderate TR noted. Normal right ventricular contraction.  MUGA (12/25/12): Normal contraction and wall motion. EF 63%.   Recent CV Pertinent Labs: Lab Results  Component Value Date   CHOL 151 11/06/2019   CHOL 156 08/01/2019   HDL 47.10 11/06/2019   HDL 45 08/01/2019   LDLCALC 73 11/06/2019   LDLCALC 88 08/01/2019   LDLDIRECT 60.0 05/08/2020   TRIG 154.0 (H) 11/06/2019   CHOLHDL 3 11/06/2019   K 3.8 05/16/2020   K 4.7 01/07/2014   BUN 12 05/16/2020   BUN 19 02/19/2019   BUN 19 (H) 01/07/2014   CREATININE 1.56 (H) 05/16/2020   CREATININE 1.24 01/07/2014    Past medical and surgical history were reviewed and updated in EPIC.  Current Meds  Medication Sig  . aspirin 81 MG tablet Take 81 mg by mouth daily.  . blood glucose meter kit and supplies KIT Dispense based on patient and insurance preference. Use up to four times daily as directed. One Touch Ultra mini meter (FOR ICD-9 250.00, 250.01). Dx: E11.9  . carvedilol (COREG) 12.5 MG tablet Take 1 tablet (12.5 mg total) by mouth 2 (two) times daily.  . cholecalciferol (VITAMIN D) 25 MCG (1000 UT) tablet Take 1,000 Units by mouth daily.  . dapagliflozin propanediol (FARXIGA) 10 MG TABS tablet Take by mouth.  . furosemide (LASIX) 20 MG tablet Take 1 tablet (20 mg total) by mouth daily as needed (for >2lb weight gain overnight or 5 lb weight gain in 1 week or swelling.).  [name] glimepiride (AMARYL) 2 MG tablet Take 2 mg by mouth daily with breakfast.  .  glucose blood (ONE TOUCH ULTRA TEST) test strip USE TO TEST BLOOD SUGAR UP TO 4 TIMES DAILY  . LANTUS SOLOSTAR 100 UNIT/ML Solostar Pen INJECT 20 UNITS INTO THE SKIN DAILY AT 10 PM. (Patient taking differently: Inject 40 Units into the skin daily at 10 pm.)  . loratadine-pseudoephedrine (CLARITIN-D 24-HOUR) 10-240 MG 24 hr tablet Take 1 tablet by mouth daily.  [name] losartan (COZAAR) 100 MG tablet TAKE 0.5 TABLETS (50 MG TOTAL) BY MOUTH 2 (TWO) TIMES DAILY.  . nitroGLYCERIN (NITROSTAT) 0.4 MG SL tablet PLACE 1 TABLET (0.4 MG TOTAL) UNDER THE TONGUE EVERY 5 (FIVE) MINUTES AS NEEDED FOR CHEST PAIN. MAXIMUM OF 3 DOSES.  [name] NOVOFINE PEN NEEDLE 32G X 6 MM MISC USE AS DIRECTED WITH LANTUS PEN  . propranolol ER (INDERAL LA) 60 MG 24 hr capsule Take 1 tablet by mouth daily.  . rosuvastatin (CRESTOR) 20 MG tablet Take 1 tablet (20 mg total) by mouth daily.  . TRULICITY 1.5 OI/7.8MV SOPN Inject 1.5 mg as directed once a week.  . vitamin B-12 (CYANOCOBALAMIN) 1000 MCG tablet Take 1,000 mcg by mouth daily.    Allergies: Advil [ibuprofen], Aleve [naproxen], Fluticasone-salmeterol, Lipitor [atorvastatin], Naproxen sodium, and Penicillins  Social History   Tobacco Use  . Smoking status: Former Smoker    Packs/day: 0.25    Years: 1.00    Pack years: 0.25    Types: Cigarettes    Quit date: 1963    Years since quitting: 59.2  . Smokeless tobacco: Never Used  Vaping Use  .  Vaping Use: Never used  Substance Use Topics  . Alcohol use: No    Alcohol/week: 0.0 standard drinks  . Drug use: No    Family History  Problem Relation Age of Onset  . Alcohol abuse Mother   . Heart disease Mother   . Hypertension Mother   . Breast cancer Mother 42  . Alcohol abuse Father   . Heart disease Father   . Hypertension Father   . Heart disease Brother   . Heart attack Brother     Review of Systems: A 12-system review of systems was performed and was negative except as noted in the  HPI.  --------------------------------------------------------------------------------------------------  Physical Exam: BP 130/60 (BP Location: Left Arm, Patient Position: Sitting, Cuff Size: Normal)   Pulse 71   Ht 5' 2.5" (1.588 m)   Wt 159 lb 6 oz (72.3 kg)   SpO2 99%   BMI 28.69 kg/m   General:  NAD. Neck: No JVD or HJR. Lungs: Clear to auscultation bilaterally without wheezes or crackles. Heart: Regular rate and rhythm without murmurs, rubs, or gallops. Abdomen: Soft, nontender, nondistended. Extremities: No lower extremity edema.  EKG:  NSR with LBBB.  No significant change from prior tracing on 02/06/2020.  Lab Results  Component Value Date   WBC 9.4 05/16/2020   HGB 11.5 (L) 05/16/2020   HCT 33.8 (L) 05/16/2020   MCV 87.6 05/16/2020   PLT 158 05/16/2020    Lab Results  Component Value Date   NA 135 05/16/2020   K 3.8 05/16/2020   CL 103 05/16/2020   CO2 26 05/16/2020   BUN 12 05/16/2020   CREATININE 1.56 (H) 05/16/2020   GLUCOSE 116 (H) 05/16/2020   ALT 22 05/16/2020    Lab Results  Component Value Date   CHOL 151 11/06/2019   HDL 47.10 11/06/2019   LDLCALC 73 11/06/2019   LDLDIRECT 60.0 05/08/2020   TRIG 154.0 (H) 11/06/2019   CHOLHDL 3 11/06/2019    --------------------------------------------------------------------------------------------------  ASSESSMENT AND PLAN: Chronic HFrEF due to NICM: Kelsey Mason appears euvolemic with stable NYHA class II symptoms.  She has been taking both propranolol and carvedilol.  We will stop propranolol and continue carvedilol 12.5 mg BID.  She will monitor her BP at home and alert [place] if it is consistent above 130/80.  Continue current doses of losartan and dapagliflozin as well as prn furosemide.  Hypertension: BP borderline today but typically much better at home.  We will stop propranolol today but otherwise continue her current medications.  Kelsey Mason should let us know if her BP is consistent above goal  (130/80).  CAD: No angina reported.  LDL at goal on last check.  Continue asprin and rosuvastatin.  PAD: Runoff disease previously noted without significant claudication.  Continue aspirin and statin therapy.  CKD: Continue follow-up with PCP and nephrology.  Avoid nephrotoxic drugs.  Follow-up: Return to clinic in 6 months.  Nelva Bush, MD 09/26/2020 9:07 AM

## 2020-09-29 DIAGNOSIS — N184 Chronic kidney disease, stage 4 (severe): Secondary | ICD-10-CM | POA: Diagnosis not present

## 2020-09-29 DIAGNOSIS — I10 Essential (primary) hypertension: Secondary | ICD-10-CM | POA: Diagnosis not present

## 2020-09-29 DIAGNOSIS — R809 Proteinuria, unspecified: Secondary | ICD-10-CM | POA: Diagnosis not present

## 2020-09-29 DIAGNOSIS — E1129 Type 2 diabetes mellitus with other diabetic kidney complication: Secondary | ICD-10-CM | POA: Diagnosis not present

## 2020-09-29 DIAGNOSIS — R600 Localized edema: Secondary | ICD-10-CM | POA: Diagnosis not present

## 2020-10-08 ENCOUNTER — Other Ambulatory Visit: Payer: Self-pay | Admitting: Internal Medicine

## 2020-10-09 ENCOUNTER — Other Ambulatory Visit: Payer: Self-pay | Admitting: Internal Medicine

## 2020-10-27 DIAGNOSIS — G250 Essential tremor: Secondary | ICD-10-CM | POA: Diagnosis not present

## 2020-10-27 DIAGNOSIS — G620 Drug-induced polyneuropathy: Secondary | ICD-10-CM | POA: Diagnosis not present

## 2020-10-27 DIAGNOSIS — E1169 Type 2 diabetes mellitus with other specified complication: Secondary | ICD-10-CM | POA: Diagnosis not present

## 2020-10-27 DIAGNOSIS — E785 Hyperlipidemia, unspecified: Secondary | ICD-10-CM | POA: Diagnosis not present

## 2020-11-09 ENCOUNTER — Other Ambulatory Visit: Payer: Self-pay | Admitting: Internal Medicine

## 2020-11-14 ENCOUNTER — Inpatient Hospital Stay: Payer: Medicare PPO | Attending: Oncology

## 2020-11-14 ENCOUNTER — Inpatient Hospital Stay: Payer: Medicare PPO | Admitting: Oncology

## 2020-11-14 ENCOUNTER — Encounter: Payer: Self-pay | Admitting: Oncology

## 2020-11-14 VITALS — BP 121/58 | HR 81 | Temp 98.8°F | Resp 18 | Wt 161.5 lb

## 2020-11-14 DIAGNOSIS — M810 Age-related osteoporosis without current pathological fracture: Secondary | ICD-10-CM | POA: Diagnosis not present

## 2020-11-14 DIAGNOSIS — N184 Chronic kidney disease, stage 4 (severe): Secondary | ICD-10-CM

## 2020-11-14 DIAGNOSIS — Z9221 Personal history of antineoplastic chemotherapy: Secondary | ICD-10-CM | POA: Diagnosis not present

## 2020-11-14 DIAGNOSIS — C50912 Malignant neoplasm of unspecified site of left female breast: Secondary | ICD-10-CM | POA: Insufficient documentation

## 2020-11-14 DIAGNOSIS — Z7982 Long term (current) use of aspirin: Secondary | ICD-10-CM | POA: Diagnosis not present

## 2020-11-14 DIAGNOSIS — Z87891 Personal history of nicotine dependence: Secondary | ICD-10-CM | POA: Diagnosis not present

## 2020-11-14 DIAGNOSIS — Z794 Long term (current) use of insulin: Secondary | ICD-10-CM | POA: Insufficient documentation

## 2020-11-14 DIAGNOSIS — E1122 Type 2 diabetes mellitus with diabetic chronic kidney disease: Secondary | ICD-10-CM | POA: Insufficient documentation

## 2020-11-14 DIAGNOSIS — Z79899 Other long term (current) drug therapy: Secondary | ICD-10-CM | POA: Insufficient documentation

## 2020-11-14 DIAGNOSIS — C50911 Malignant neoplasm of unspecified site of right female breast: Secondary | ICD-10-CM | POA: Insufficient documentation

## 2020-11-14 DIAGNOSIS — I129 Hypertensive chronic kidney disease with stage 1 through stage 4 chronic kidney disease, or unspecified chronic kidney disease: Secondary | ICD-10-CM | POA: Diagnosis not present

## 2020-11-14 DIAGNOSIS — Z853 Personal history of malignant neoplasm of breast: Secondary | ICD-10-CM | POA: Diagnosis not present

## 2020-11-14 DIAGNOSIS — M818 Other osteoporosis without current pathological fracture: Secondary | ICD-10-CM

## 2020-11-14 DIAGNOSIS — Z923 Personal history of irradiation: Secondary | ICD-10-CM | POA: Diagnosis not present

## 2020-11-14 DIAGNOSIS — E1140 Type 2 diabetes mellitus with diabetic neuropathy, unspecified: Secondary | ICD-10-CM | POA: Diagnosis not present

## 2020-11-14 DIAGNOSIS — Z79811 Long term (current) use of aromatase inhibitors: Secondary | ICD-10-CM | POA: Diagnosis not present

## 2020-11-14 DIAGNOSIS — Z170 Estrogen receptor positive status [ER+]: Secondary | ICD-10-CM | POA: Diagnosis not present

## 2020-11-14 LAB — CBC WITH DIFFERENTIAL/PLATELET
Abs Immature Granulocytes: 0.06 10*3/uL (ref 0.00–0.07)
Basophils Absolute: 0.1 10*3/uL (ref 0.0–0.1)
Basophils Relative: 1 %
Eosinophils Absolute: 0.5 10*3/uL (ref 0.0–0.5)
Eosinophils Relative: 5 %
HCT: 34.6 % — ABNORMAL LOW (ref 36.0–46.0)
Hemoglobin: 11.8 g/dL — ABNORMAL LOW (ref 12.0–15.0)
Immature Granulocytes: 1 %
Lymphocytes Relative: 15 %
Lymphs Abs: 1.5 10*3/uL (ref 0.7–4.0)
MCH: 31.1 pg (ref 26.0–34.0)
MCHC: 34.1 g/dL (ref 30.0–36.0)
MCV: 91.3 fL (ref 80.0–100.0)
Monocytes Absolute: 0.6 10*3/uL (ref 0.1–1.0)
Monocytes Relative: 6 %
Neutro Abs: 7.7 10*3/uL (ref 1.7–7.7)
Neutrophils Relative %: 72 %
Platelets: 164 10*3/uL (ref 150–400)
RBC: 3.79 MIL/uL — ABNORMAL LOW (ref 3.87–5.11)
RDW: 12.4 % (ref 11.5–15.5)
WBC: 10.5 10*3/uL (ref 4.0–10.5)
nRBC: 0 % (ref 0.0–0.2)

## 2020-11-14 LAB — COMPREHENSIVE METABOLIC PANEL
ALT: 20 U/L (ref 0–44)
AST: 24 U/L (ref 15–41)
Albumin: 4.1 g/dL (ref 3.5–5.0)
Alkaline Phosphatase: 77 U/L (ref 38–126)
Anion gap: 7 (ref 5–15)
BUN: 14 mg/dL (ref 8–23)
CO2: 25 mmol/L (ref 22–32)
Calcium: 9.1 mg/dL (ref 8.9–10.3)
Chloride: 103 mmol/L (ref 98–111)
Creatinine, Ser: 1.39 mg/dL — ABNORMAL HIGH (ref 0.44–1.00)
GFR, Estimated: 39 mL/min — ABNORMAL LOW (ref >60)
Glucose, Bld: 152 mg/dL — ABNORMAL HIGH (ref 70–99)
Potassium: 5 mmol/L (ref 3.5–5.1)
Sodium: 135 mmol/L (ref 135–145)
Total Bilirubin: 0.7 mg/dL (ref 0.3–1.2)
Total Protein: 6.6 g/dL (ref 6.5–8.1)

## 2020-11-14 NOTE — Progress Notes (Signed)
[facility] day:  11/14/2020  Chief Complaint: Kelsey Mason is a 80 y.o. female with bilateral stage IA breast cancer who is seen for 6 month assessment on Femara.  PERTINENT ONCOLOGY HISTORY Kelsey Mason is a 80 y.o.afemale who has above oncology history reviewed by me today presented for follow up visit for management of history of bilateral breast cancer. Patient previously follows up with Dr. Mike Gip. Switched care to me on 09/18/2018. Medical record review was performed by me.  bilateral breast cancer s/p lumpectomy and sentinel lymph node biopsy on 11/17/2012.  Right breast revealed a 5 mm grade I invasive carcinoma.  There was no DCIS.  Two sentinel lymph nodes were negative.  Pathologic stage was T1aN0M0.  Tumor was ER + (90%), PR + (20%), and Her2/neu -.   Left breast revealed a 1.8 cm grade III invasive ductal carcinoma.  There was lymphovascular invasion.  There was no DCIS.  One sentinel lymph node was negative.  Pathologic stage was T1cN0M0.  Tumor was ER + (>90%), PR + (1-5%), and Her2/neu -.  Oncotype DX testing on the left breast mass revealed a recurrence score of 28 which corresponded to a 10 year risk of distant recurrence of 19% (CI 15-23%) with tamoxifen alone.  BCI testing on 10/04/2017 revealed a high risk of late recurrence (5.4%; CI 2.5%-8.2%) during years 5-10.  There was a low likelihood of benefit from extended endocrine therapy.  She received Adriamycin and Cytoxan (AC) x 4 cycles followed by weekly Taxol x 10 (completed 06/05/2013).  She did not receive 2 cycles of Taxol secondary to progressive neuropathy.  She received bilateral breast radiation (07/2013 - 09/2013).  She began letrozole (Femara) in 09/2013.  CA27.29 has been followed:  24.4 on 09/10/2016, 28.1 on 03/14/2017, 24.9 on 09/12/2017, and 22.8 on 03/13/2018. She has a history of a borderline mild normocytic anemia.  Diet appears modest.   She denies any melena, hematochezia, hematuria or vaginal bleeding. Colonoscopy on 10/13/2015 was negative.    Soft tissue neck ultrasound on 03/16/2017 revealed no fluid collection or soft tissue lesion.  09/2013- 12/2019 Letrozole, which was discontinued due to worsening of osteoporosis and  Her BCI was obtained previously 10/04/2017 revealed a high risk of late recurrence (5.4%; CI 2.5%-8.2%) during years 5-10.  There was a low likelihood of benefit from extended endocrine therapy bone density done on 12/20/2019 Her T score was -2.3 in 2019 now left femoral neck T score at -2.5. She has progressed to osteoporosis state. I have previously discussed with patient about dental clearance and bisphosphonate use.  At that time, patient decided not to proceed with intervention due to COVID-19 pandemic      Kelsey Mason is a 80 y.o. female who has above history reviewed by me today presents for follow up visit for management of bilateral breast cancer. Problems and complaints are listed below: Patient reports doing well.  She has chronic aches and pains but  Review of Systems  Constitutional: Negative for appetite change, chills, fatigue and fever.  HENT:   Negative for hearing loss and voice change.   Eyes: Negative for eye problems.  Respiratory: Negative for chest tightness and cough.   Cardiovascular: Negative for chest pain.  Gastrointestinal: Negative for abdominal distention, abdominal pain, blood in stool and nausea.  Endocrine: Negative for hot flashes.  Genitourinary: Negative for difficulty urinating and frequency.   Musculoskeletal: Positive for arthralgias.  Skin: Negative for  itching and rash.  Neurological: Negative for extremity weakness and headaches.  Hematological: Negative for adenopathy.  Psychiatric/Behavioral: Negative for confusion.     Past Medical History:  Diagnosis Date  . Arthritis    fingers  . Breast symptom 2014  . Cancer  [facility]) 2014   Bilateral Breast, chemo Dr. Jeb Levering  . Degenerative disc disease, lumbar   . Diabetes mellitus   . GERD (gastroesophageal reflux disease)    mild  . Hypertension   . Personal history of chemotherapy   . Personal history of radiation therapy 2014   bilat lumpectomy    Past Surgical History:  Procedure Laterality Date  . ABDOMINAL AORTOGRAM W/LOWER EXTREMITY N/A 04/21/2018   Procedure: ABDOMINAL AORTOGRAM W/LOWER EXTREMITY;  Surgeon: Nelva Bush, MD;  Location: [place] [facility];  Service: Cardiovascular;  Laterality: N/A;  . ABDOMINAL HYSTERECTOMY  1976   for pelvic pain  . BREAST EXCISIONAL BIOPSY Bilateral 11/2012   bilat breast ca chemo and rad  . BREAST SURGERY Bilateral 2014   bilateral lumpectomy and SN biopsy  . CATARACT EXTRACTION W/PHACO Left 07/30/2020   Procedure: CATARACT EXTRACTION PHACO AND INTRAOCULAR LENS PLACEMENT (I[facility]) LEFT DIABETIC 18.73 01:45.0 17.8%;  Surgeon: Leandrew Koyanagi, MD;  Location: [place];  Service: Ophthalmology;  Laterality: Left;  Diabetic - insulin and opral meds  . CATARACT EXTRACTION W/PHACO Right 08/13/2020   Procedure: CATARACT EXTRACTION PHACO AND INTRAOCULAR LENS PLACEMENT ([employer]) RIGHT DIABETIC;  Surgeon: Leandrew Koyanagi, MD;  Location: [place];  Service: Ophthalmology;  Laterality: Right;  10.67 1:27.8 12.1%  . CHOLECYSTECTOMY  2007  . COLONOSCOPY WITH PROPOFOL N/A 10/13/2015   Procedure: COLONOSCOPY WITH PROPOFOL;  Surgeon: Manya Silvas, MD;  [facility];  Service: Endoscopy;  Laterality: N/A;  . LEFT HEART CATH AND CORONARY ANGIOGRAPHY N/A 04/21/2018   Procedure: LEFT HEART CATH AND CORONARY ANGIOGRAPHY;  Surgeon: Nelva Bush, MD;  Location: [place] [facility];  Service: Cardiovascular;  Laterality: N/A;  . OOPHORECTOMY Right   . PORTACATH PLACEMENT  2014  . TUMOR REMOVAL Right 1980   right foot    Family History  Problem Relation Age of Onset  . Alcohol abuse  Mother   . Heart disease Mother   . Hypertension Mother   . Breast cancer Mother 47  . Alcohol abuse Father   . Heart disease Father   . Hypertension Father   . Heart disease Brother   . Heart attack Brother    Social History   Socioeconomic History  . Marital status: Widowed    Spouse name: Not on file  . Number of children: Not on file  . Years of education: Not on file  . Highest education level: Not on file  Occupational History  . Not on file  Tobacco Use  . Smoking status: Former Smoker    Packs/day: 0.25    Years: 1.00    Pack years: 0.25    Types: Cigarettes    Quit date: 1963    Years since quitting: 59.4  . Smokeless tobacco: Never Used  Vaping Use  . Vaping Use: Never used  Substance and Sexual Activity  . Alcohol use: No    Alcohol/week: 0.0 standard drinks  . Drug use: No  . Sexual activity: Never  Other Topics Concern  . Not on file  Social History Narrative   Lives in [place].       Works - [employer]   Diet - regular   Exercise - dance  3x per week   Social Determinants of Health   Financial Resource Strain: Low Risk   . Difficulty of Paying Living Expenses: Not hard at all  Food Insecurity: No Food Insecurity  . Worried About Charity fundraiser in the Last Year: Never true  . Ran Out of Food in the Last Year: Never true  Transportation Needs: No Transportation Needs  . Lack of Transportation (Medical): No  . Lack of Transportation (Non-Medical): No  Physical Activity: Not on file  Stress: No Stress Concern Present  . Feeling of Stress : Not at all  Social Connections: Not on file  Intimate Partner Violence: Not on file     She lives by herself in [place].  Husband passed away many years ago.  The patient is alone today.  Allergies:  Allergies  Allergen Reactions  . Advil [Ibuprofen] Other (See Comments)    Dizziness   . Aleve [Naproxen] Other (See Comments)    Dizziness  . Fluticasone-Salmeterol Other (See Comments)     dizziness  . Lipitor [Atorvastatin] Other (See Comments)    Legs weak.  Leg pain, muscle ache  . Naproxen Sodium Other (See Comments)  . Penicillins Hives and Other (See Comments)    Has patient had a PCN reaction causing immediate rash, facial/tongue/throat swelling, SOB or lightheadedness with hypotension: YES Has patient had a PCN reaction causing severe rash involving mucus membranes or skin necrosis: no Has patient had a PCN reaction that required hospitalization: no Has patient had a PCN reaction occurring within the last 10 years: no If all of the above answers are "NO", then may proceed with Cephalosporin use.     Current Medications: Current Outpatient Medications  Medication Sig Dispense Refill  . aspirin 81 MG tablet Take 81 mg by mouth daily.    . blood glucose meter kit and supplies KIT Dispense based on patient and insurance preference. Use up to four times daily as directed. One Touch Ultra mini meter (FOR ICD-9 250.00, 250.01). Dx: E11.9 1 each 0  . carvedilol (COREG) 12.5 MG tablet TAKE 1 TABLET BY MOUTH 2 TIMES DAILY. 180 tablet 0  . cholecalciferol (VITAMIN D) 25 MCG (1000 UT) tablet Take 1,000 Units by mouth daily.    . dapagliflozin propanediol (FARXIGA) 10 MG TABS tablet Take by mouth.    . furosemide (LASIX) 20 MG tablet TAKE 1 TABLET (20 MG TOTAL) BY MOUTH DAILY AS NEEDED (FOR >2LB WEIGHT GAIN OVERNIGHT OR 5 LB WEIGHT GAIN IN 1 WEEK OR SWELLING.). 90 tablet 3  . glimepiride (AMARYL) 2 MG tablet Take 2 mg by mouth daily with breakfast.    . glucose blood (ONE TOUCH ULTRA TEST) test strip USE TO TEST BLOOD SUGAR UP TO 4 TIMES DAILY 100 each 6  . LANTUS SOLOSTAR 100 UNIT/ML Solostar Pen INJECT 20 UNITS INTO THE SKIN DAILY AT 10 PM. (Patient taking differently: Inject 40 Units into the skin daily at 10 pm.) 18 pen 0  . loratadine-pseudoephedrine (CLARITIN-D 24-HOUR) 10-240 MG 24 hr tablet Take 1 tablet by mouth daily.    [name] losartan (COZAAR) 100 MG tablet TAKE 1/2 TABLET  (50 MG TOTAL) BY MOUTH 2 (TWO) TIMES DAILY. 90 tablet 1  . nitroGLYCERIN (NITROSTAT) 0.4 MG SL tablet PLACE 1 TABLET (0.4 MG TOTAL) UNDER THE TONGUE EVERY 5 (FIVE) MINUTES AS NEEDED FOR CHEST PAIN. MAXIMUM OF 3 DOSES. 25 tablet 3  . NOVOFINE PEN NEEDLE 32G X 6 MM MISC USE AS DIRECTED WITH LANTUS PEN 100  each 2  . rosuvastatin (CRESTOR) 20 MG tablet Take 1 tablet (20 mg total) by mouth daily. 90 tablet 3  . TRULICITY 1.5 IW/8.0HO SOPN Inject 1.5 mg as directed once a week.  11  . vitamin B-12 (CYANOCOBALAMIN) 1000 MCG tablet Take 1,000 mcg by mouth daily.     No current facility-administered medications for this visit.   Physical Exam: Blood pressure (!) 121/58, pulse 81, temperature 98.8 F (37.1 C), temperature source Tympanic, resp. rate 18, weight 161 lb 8 oz (73.3 kg), SpO2 98 %. Physical Exam Constitutional:      General: She is not in acute distress.    Appearance: She is not diaphoretic.  HENT:     Head: Normocephalic and atraumatic.     Nose: Nose normal.     Mouth/Throat:     Pharynx: No oropharyngeal exudate.  Eyes:     General: No scleral icterus.    Pupils: Pupils are equal, round, and reactive to light.  Cardiovascular:     Rate and Rhythm: Normal rate and regular rhythm.     Heart sounds: No murmur heard.   Pulmonary:     Effort: Pulmonary effort is normal. No respiratory distress.     Breath sounds: No rales.  Chest:     Chest wall: No tenderness.  Abdominal:     General: There is no distension.     Palpations: Abdomen is soft.     Tenderness: There is no abdominal tenderness.  Musculoskeletal:        General: Normal range of motion.     Cervical back: Normal range of motion and neck supple.  Skin:    General: Skin is warm and dry.     Findings: No erythema.  Neurological:     Mental Status: She is alert and oriented to person, place, and time.     Cranial Nerves: No cranial nerve deficit.     Motor: No abnormal muscle tone.     Coordination:  Coordination normal.  Psychiatric:        Mood and Affect: Affect normal.    Breast exam was performed in seated and lying down position. Patient is status post bilateral lumpectomy with well-healed surgical scar.  Bilateral chronic nipple retraction  no evidence of any palpable masses. No evidence of axillary adenopathy bilaterally. round "sacrring tissue around right nipple, chronic per patient.   Appointment on 11/14/2020  Component Date Value Ref Range Status  . Sodium 11/14/2020 135  135 - 145 mmol/L Final  . Potassium 11/14/2020 5.0  3.5 - 5.1 mmol/L Final  . Chloride 11/14/2020 103  98 - 111 mmol/L Final  . CO2 11/14/2020 25  22 - 32 mmol/L Final  . Glucose, Bld 11/14/2020 152* 70 - 99 mg/dL Final   Glucose reference range applies only to samples taken after fasting for at least 8 hours.  . BUN 11/14/2020 14  8 - 23 mg/dL Final  . Creatinine, Ser 11/14/2020 1.39* 0.44 - 1.00 mg/dL Final  . Calcium 11/14/2020 9.1  8.9 - 10.3 mg/dL Final  . Total Protein 11/14/2020 6.6  6.5 - 8.1 g/dL Final  . Albumin 11/14/2020 4.1  3.5 - 5.0 g/dL Final  . AST 11/14/2020 24  15 - 41 U/L Final  . ALT 11/14/2020 20  0 - 44 U/L Final  . Alkaline Phosphatase 11/14/2020 77  38 - 126 U/L Final  . Total Bilirubin 11/14/2020 0.7  0.3 - 1.2 mg/dL Final  . GFR, Estimated 11/14/2020 39* >60  mL/min Final   Comment: (NOTE) Calculated using the CKD-EPI Creatinine Equation (2021)   . Anion gap 11/14/2020 7  5 - 15 Final   Performed at [facility], [address] [place], [place] [address]  . WBC 11/14/2020 10.5  4.0 - 10.5 K/uL Final  . RBC 11/14/2020 3.79* 3.87 - 5.11 MIL/uL Final  . Hemoglobin 11/14/2020 11.8* 12.0 - 15.0 g/dL Final  . HCT 11/14/2020 34.6* 36.0 - 46.0 % Final  . MCV 11/14/2020 91.3  80.0 - 100.0 fL Final  . MCH 11/14/2020 31.1  26.0 - 34.0 pg Final  . MCHC 11/14/2020 34.1  30.0 - 36.0 g/dL Final  . RDW 11/14/2020 12.4  11.5 - 15.5 % Final  . Platelets 11/14/2020 164  150 -  400 K/uL Final  . nRBC 11/14/2020 0.0  0.0 - 0.2 % Final  . Neutrophils Relative % 11/14/2020 72  % Final  . Neutro Abs 11/14/2020 7.7  1.7 - 7.7 K/uL Final  . Lymphocytes Relative 11/14/2020 15  % Final  . Lymphs Abs 11/14/2020 1.5  0.7 - 4.0 K/uL Final  . Monocytes Relative 11/14/2020 6  % Final  . Monocytes Absolute 11/14/2020 0.6  0.1 - 1.0 K/uL Final  . Eosinophils Relative 11/14/2020 5  % Final  . Eosinophils Absolute 11/14/2020 0.5  0.0 - 0.5 K/uL Final  . Basophils Relative 11/14/2020 1  % Final  . Basophils Absolute 11/14/2020 0.1  0.0 - 0.1 K/uL Final  . Immature Granulocytes 11/14/2020 1  % Final  . Abs Immature Granulocytes 11/14/2020 0.06  0.00 - 0.07 K/uL Final   Performed at [facility], [address] [place], [place] [address]   RADIOGRAPHIC STUDIES: I have personally reviewed the radiological images as listed and agreed with the findings in the report. No results found.  Assessment:  Bettyjean Stefanski is a 80 y.o. female presents for follow-up of bilateral stage I breast cancer.  1. History of breast cancer   2. Stage 4 chronic kidney disease ([place])   3. Other osteoporosis without current pathological fracture   # History of breast cancer- 2014 Patient has been off letrozole. Labs are reviewed and discussed with patient. Obtain annual screening mammogram.  #Osteoporosis, left femoral neck, discussed with patient about the recommendation of Prolia every 6 months. Continue calcium and vitamin D supplementation Recommend patient to obtain dental clearance if she wants to proceed with Prolia treatments.  #CKD, creatinines are stable.  Avoid nephrotoxin.   RTC in 6 months MD assessment and labs   Earlie Server, MD, PhD Hematology Oncology [facility] Pager- [phone] 11/14/2020

## 2020-11-20 DIAGNOSIS — E113211 Type 2 diabetes mellitus with mild nonproliferative diabetic retinopathy with macular edema, right eye: Secondary | ICD-10-CM | POA: Diagnosis not present

## 2020-11-25 DIAGNOSIS — I10 Essential (primary) hypertension: Secondary | ICD-10-CM | POA: Diagnosis not present

## 2020-11-25 DIAGNOSIS — N1832 Chronic kidney disease, stage 3b: Secondary | ICD-10-CM | POA: Diagnosis not present

## 2020-12-02 DIAGNOSIS — N1832 Chronic kidney disease, stage 3b: Secondary | ICD-10-CM | POA: Diagnosis not present

## 2020-12-02 DIAGNOSIS — R600 Localized edema: Secondary | ICD-10-CM | POA: Diagnosis not present

## 2020-12-02 DIAGNOSIS — I10 Essential (primary) hypertension: Secondary | ICD-10-CM | POA: Diagnosis not present

## 2020-12-02 DIAGNOSIS — E1129 Type 2 diabetes mellitus with other diabetic kidney complication: Secondary | ICD-10-CM | POA: Diagnosis not present

## 2020-12-03 ENCOUNTER — Ambulatory Visit: Payer: Medicare PPO | Admitting: Family Medicine

## 2020-12-03 ENCOUNTER — Other Ambulatory Visit: Payer: Self-pay

## 2020-12-03 ENCOUNTER — Encounter: Payer: Self-pay | Admitting: Family Medicine

## 2020-12-03 DIAGNOSIS — G620 Drug-induced polyneuropathy: Secondary | ICD-10-CM

## 2020-12-03 DIAGNOSIS — C50911 Malignant neoplasm of unspecified site of right female breast: Secondary | ICD-10-CM

## 2020-12-03 DIAGNOSIS — Z170 Estrogen receptor positive status [ER+]: Secondary | ICD-10-CM | POA: Diagnosis not present

## 2020-12-03 DIAGNOSIS — Z794 Long term (current) use of insulin: Secondary | ICD-10-CM

## 2020-12-03 DIAGNOSIS — T451X5A Adverse effect of antineoplastic and immunosuppressive drugs, initial encounter: Secondary | ICD-10-CM | POA: Diagnosis not present

## 2020-12-03 DIAGNOSIS — N1832 Chronic kidney disease, stage 3b: Secondary | ICD-10-CM

## 2020-12-03 DIAGNOSIS — E1122 Type 2 diabetes mellitus with diabetic chronic kidney disease: Secondary | ICD-10-CM | POA: Diagnosis not present

## 2020-12-03 DIAGNOSIS — I10 Essential (primary) hypertension: Secondary | ICD-10-CM

## 2020-12-03 DIAGNOSIS — I2510 Atherosclerotic heart disease of native coronary artery without angina pectoris: Secondary | ICD-10-CM | POA: Diagnosis not present

## 2020-12-03 DIAGNOSIS — I5022 Chronic systolic (congestive) heart failure: Secondary | ICD-10-CM

## 2020-12-03 NOTE — Assessment & Plan Note (Signed)
She will see endocrinology next week.  She will continue farxiga, Lantus, and Trulicity.

## 2020-12-03 NOTE — Patient Instructions (Addendum)
Nice to see you. Please consider getting the Shingrix vaccine.

## 2020-12-03 NOTE — Assessment & Plan Note (Signed)
Improved on recheck.  She will continue losartan and carvedilol.

## 2020-12-03 NOTE — Assessment & Plan Note (Signed)
She continues to follow with oncology.  She has follow-up scheduled later this year.  Her mammogram is up-to-date.  She is no longer on letrozole.

## 2020-12-03 NOTE — Assessment & Plan Note (Signed)
Denies recent symptoms.  She has not needed nitroglycerin.  She will monitor continue to see cardiology.

## 2020-12-03 NOTE — Progress Notes (Signed)
[name], MD Phone: [phone]  Kelsey Mason is a 80 y.o. female who presents today for f/u.  HYPERTENSION/CHF Disease Monitoring  Home BP Monitoring not checking Chest pain- no    Dyspnea- no Medications  Compliance-  Taking farxiga, lasix prn, losartan, coreg.   Edema- rare and resolves with lasix No orthopnea or PND.   DIABETES Disease Monitoring: Blood Sugar ranges-70-120s Polyuria/phagia/dipsia- no      Optho- UTD Medications: Compliance- taking farxiga, lantus 40 u daily, trulicity Hypoglycemic symptoms- feels low 2-3x/week though is never less than 70  Neuropathy: This is a chronic issue.  Has been going on since she had chemotherapy.  Stable.  She declines medication for this.   Social History   Tobacco Use  Smoking Status Former Smoker  . Packs/day: 0.25  . Years: 1.00  . Pack years: 0.25  . Types: Cigarettes  . Quit date: 1963  . Years since quitting: 59.4  Smokeless Tobacco Never Used    Current Outpatient Medications on File Prior to Visit  Medication Sig Dispense Refill  . aspirin 81 MG tablet Take 81 mg by mouth daily.    . blood glucose meter kit and supplies KIT Dispense based on patient and insurance preference. Use up to four times daily as directed. One Touch Ultra mini meter (FOR ICD-9 250.00, 250.01). Dx: E11.9 1 each 0  . carvedilol (COREG) 12.5 MG tablet TAKE 1 TABLET BY MOUTH 2 TIMES DAILY. 180 tablet 0  . cholecalciferol (VITAMIN D) 25 MCG (1000 UT) tablet Take 1,000 Units by mouth daily.    . dapagliflozin propanediol (FARXIGA) 10 MG TABS tablet Take by mouth.    . furosemide (LASIX) 20 MG tablet TAKE 1 TABLET (20 MG TOTAL) BY MOUTH DAILY AS NEEDED (FOR >2LB WEIGHT GAIN OVERNIGHT OR 5 LB WEIGHT GAIN IN 1 WEEK OR SWELLING.). 90 tablet 3  . glimepiride (AMARYL) 2 MG tablet Take 2 mg by mouth daily with breakfast.    . glucose blood (ONE TOUCH ULTRA TEST) test strip USE TO TEST BLOOD SUGAR UP TO 4 TIMES DAILY 100 each 6  . LANTUS  SOLOSTAR 100 UNIT/ML Solostar Pen INJECT 20 UNITS INTO THE SKIN DAILY AT 10 PM. (Patient taking differently: Inject 40 Units into the skin daily at 10 pm.) 18 pen 0  . loratadine-pseudoephedrine (CLARITIN-D 24-HOUR) 10-240 MG 24 hr tablet Take 1 tablet by mouth daily.    [name] losartan (COZAAR) 100 MG tablet TAKE 1/2 TABLET (50 MG TOTAL) BY MOUTH 2 (TWO) TIMES DAILY. 90 tablet 1  . nitroGLYCERIN (NITROSTAT) 0.4 MG SL tablet PLACE 1 TABLET (0.4 MG TOTAL) UNDER THE TONGUE EVERY 5 (FIVE) MINUTES AS NEEDED FOR CHEST PAIN. MAXIMUM OF 3 DOSES. 25 tablet 3  . NOVOFINE PEN NEEDLE 32G X 6 MM MISC USE AS DIRECTED WITH LANTUS PEN 100 each 2  . rosuvastatin (CRESTOR) 20 MG tablet Take 1 tablet (20 mg total) by mouth daily. 90 tablet 3  . TRULICITY 1.5 QL/7.3PV SOPN Inject 1.5 mg as directed once a week.  11  . vitamin B-12 (CYANOCOBALAMIN) 1000 MCG tablet Take 1,000 mcg by mouth daily.     No current facility-administered medications on file prior to visit.     ROS see history of present illness  Objective  Physical Exam Vitals:   12/03/20 0853 12/03/20 0917  BP: 140/80 130/80  Pulse: 74   Temp: 97.7 F (36.5 C)   SpO2: 99%     BP Readings from Last 3 Encounters:  12/03/20 130/80  11/14/20 (!) 121/58  09/26/20 130/60   Wt Readings from Last 3 Encounters:  12/03/20 160 lb (72.6 kg)  11/14/20 161 lb 8 oz (73.3 kg)  09/26/20 159 lb 6 oz (72.3 kg)    Physical Exam Constitutional:      General: She is not in acute distress.    Appearance: She is not diaphoretic.  Cardiovascular:     Rate and Rhythm: Normal rate and regular rhythm.     Heart sounds: Normal heart sounds.  Pulmonary:     Effort: Pulmonary effort is normal.     Breath sounds: Normal breath sounds.  Musculoskeletal:     Right lower leg: No edema.     Left lower leg: No edema.  Skin:    General: Skin is warm and dry.  Neurological:     Mental Status: She is alert.      Assessment/Plan: Please see individual problem  list.  Problem List Items Addressed This Visit    Essential hypertension (Chronic)    Improved on recheck.  She will continue losartan and carvedilol.      Neuropathy due to chemotherapeutic drug (HCC) (Chronic)    Stable.  She has declined treatment for this.  She will monitor.      Chronic HFrEF (heart failure with reduced ejection fraction) (HCC) (Chronic)    Euvolemic.  Weight has been stable.  She will continue carvedilol 12.5 mg twice daily, farxiga 10 mg daily, Lasix 20 mg daily as needed, and losartan 100 mg daily.  BMP done recently through nephrology.      Type 2 diabetes mellitus with stage 3 chronic kidney disease, with long-term current use of insulin (HCC) (Chronic)    She will see endocrinology next week.  She will continue farxiga, Lantus, and Trulicity.      Coronary artery disease involving native coronary artery of native heart without angina pectoris (Chronic)    Denies recent symptoms.  She has not needed nitroglycerin.  She will monitor continue to see cardiology.      Malignant neoplasm of right breast in female, estrogen receptor positive ([place])    She continues to follow with oncology.  She has follow-up scheduled later this year.  Her mammogram is up-to-date.  She is no longer on letrozole.          Health Maintenance: I discussed the Shingrix vaccine with the patient.  She declines this.  Return in about 9 months (around 09/02/2021). I opted to have the patient follow-up in 9 months as she is seeing multiple specialists in the interim and this will allow [place] to alternate her seeing me and her specialists.  This visit occurred during the SARS-CoV-2 public health emergency.  Safety protocols were in place, including screening questions prior to the visit, additional usage of staff PPE, and extensive cleaning of exam room while observing appropriate contact time as indicated for disinfecting solutions.    [name], MD [place]

## 2020-12-03 NOTE — Assessment & Plan Note (Signed)
Euvolemic.  Weight has been stable.  She will continue carvedilol 12.5 mg twice daily, farxiga 10 mg daily, Lasix 20 mg daily as needed, and losartan 100 mg daily.  BMP done recently through nephrology.

## 2020-12-03 NOTE — Assessment & Plan Note (Signed)
Stable.  She has declined treatment for this.  She will monitor.

## 2020-12-10 DIAGNOSIS — E1122 Type 2 diabetes mellitus with diabetic chronic kidney disease: Secondary | ICD-10-CM | POA: Diagnosis not present

## 2020-12-10 DIAGNOSIS — I10 Essential (primary) hypertension: Secondary | ICD-10-CM | POA: Diagnosis not present

## 2020-12-10 DIAGNOSIS — E1129 Type 2 diabetes mellitus with other diabetic kidney complication: Secondary | ICD-10-CM | POA: Diagnosis not present

## 2020-12-10 DIAGNOSIS — E1159 Type 2 diabetes mellitus with other circulatory complications: Secondary | ICD-10-CM | POA: Diagnosis not present

## 2020-12-10 DIAGNOSIS — R809 Proteinuria, unspecified: Secondary | ICD-10-CM | POA: Diagnosis not present

## 2020-12-10 DIAGNOSIS — Z794 Long term (current) use of insulin: Secondary | ICD-10-CM | POA: Diagnosis not present

## 2020-12-10 DIAGNOSIS — N1832 Chronic kidney disease, stage 3b: Secondary | ICD-10-CM | POA: Diagnosis not present

## 2020-12-17 DIAGNOSIS — Z961 Presence of intraocular lens: Secondary | ICD-10-CM | POA: Diagnosis not present

## 2021-02-14 ENCOUNTER — Other Ambulatory Visit: Payer: Self-pay | Admitting: Internal Medicine

## 2021-03-17 DIAGNOSIS — Z794 Long term (current) use of insulin: Secondary | ICD-10-CM | POA: Diagnosis not present

## 2021-03-17 DIAGNOSIS — E1129 Type 2 diabetes mellitus with other diabetic kidney complication: Secondary | ICD-10-CM | POA: Diagnosis not present

## 2021-03-17 DIAGNOSIS — R809 Proteinuria, unspecified: Secondary | ICD-10-CM | POA: Diagnosis not present

## 2021-03-24 DIAGNOSIS — E1122 Type 2 diabetes mellitus with diabetic chronic kidney disease: Secondary | ICD-10-CM | POA: Diagnosis not present

## 2021-03-24 DIAGNOSIS — N1832 Chronic kidney disease, stage 3b: Secondary | ICD-10-CM | POA: Diagnosis not present

## 2021-03-24 DIAGNOSIS — Z794 Long term (current) use of insulin: Secondary | ICD-10-CM | POA: Diagnosis not present

## 2021-03-24 DIAGNOSIS — I10 Essential (primary) hypertension: Secondary | ICD-10-CM | POA: Diagnosis not present

## 2021-03-24 DIAGNOSIS — E1159 Type 2 diabetes mellitus with other circulatory complications: Secondary | ICD-10-CM | POA: Diagnosis not present

## 2021-03-24 DIAGNOSIS — R809 Proteinuria, unspecified: Secondary | ICD-10-CM | POA: Diagnosis not present

## 2021-03-24 DIAGNOSIS — E1129 Type 2 diabetes mellitus with other diabetic kidney complication: Secondary | ICD-10-CM | POA: Diagnosis not present

## 2021-04-01 ENCOUNTER — Ambulatory Visit: Payer: Medicare PPO | Admitting: Internal Medicine

## 2021-04-01 ENCOUNTER — Other Ambulatory Visit: Payer: Self-pay

## 2021-04-01 ENCOUNTER — Encounter: Payer: Self-pay | Admitting: Internal Medicine

## 2021-04-01 VITALS — BP 146/64 | HR 64 | Ht 62.5 in | Wt 157.0 lb

## 2021-04-01 DIAGNOSIS — I5022 Chronic systolic (congestive) heart failure: Secondary | ICD-10-CM

## 2021-04-01 DIAGNOSIS — I2510 Atherosclerotic heart disease of native coronary artery without angina pectoris: Secondary | ICD-10-CM

## 2021-04-01 DIAGNOSIS — E785 Hyperlipidemia, unspecified: Secondary | ICD-10-CM

## 2021-04-01 DIAGNOSIS — I739 Peripheral vascular disease, unspecified: Secondary | ICD-10-CM

## 2021-04-01 DIAGNOSIS — E1169 Type 2 diabetes mellitus with other specified complication: Secondary | ICD-10-CM | POA: Diagnosis not present

## 2021-04-01 DIAGNOSIS — I428 Other cardiomyopathies: Secondary | ICD-10-CM

## 2021-04-01 DIAGNOSIS — N1832 Chronic kidney disease, stage 3b: Secondary | ICD-10-CM | POA: Diagnosis not present

## 2021-04-01 DIAGNOSIS — I10 Essential (primary) hypertension: Secondary | ICD-10-CM

## 2021-04-01 NOTE — Patient Instructions (Addendum)
Medication Instructions:  - Your physician recommends that you continue on your current medications as directed. Please refer to the Current Medication list given to you today.  *If you need a refill on your cardiac medications before your next appointment, please call your pharmacy*   Lab Work: - none ordered  If you have labs (blood work) drawn today and your tests are completely normal, you will receive your results only by: [place] (if you have MyChart) OR A paper copy in the mail If you have any lab test that is abnormal or we need to change your treatment, we will call you to review the results.   Testing/Procedures: - none ordered   Follow-Up: At [facility], you and your health needs are our priority.  As part of our continuing mission to provide you with exceptional heart care, we have created designated Provider Care Teams.  These Care Teams include your primary Cardiologist (physician) and Advanced Practice Providers (APPs -  Physician Assistants and Nurse Practitioners) who all work together to provide you with the care you need, when you need it.  We recommend signing up for the patient portal called "MyChart".  Sign up information is provided on this After Visit Summary.  MyChart is used to connect with patients for Virtual Visits (Telemedicine).  Patients are able to view lab/test results, encounter notes, upcoming appointments, etc.  Non-urgent messages can be sent to your provider as well.   To learn more about what you can do with MyChart, go to [email or web address].    Your next appointment:   6 month(s)  The format for your next appointment:   In Person  Provider:   You may see Nelva Bush, MD or one of the following Advanced Practice Providers on your designated Care Team:   Murray Hodgkins, NP Christell Faith, PA-C Marrianne Mood, PA-C Cadence Kathlen Mody, [place]   Other Instructions - Please try to decrease the sodium intake in your  diet   Cooking With Less Salt Cooking with less salt is one way to reduce the amount of sodium you get from food. Sodium is one of the elements that make up salt. It is found naturally in foods and is also added to certain foods. Depending on your condition and overall health, your health care provider or dietitian may recommend that you reduce your sodium intake. Most people should have less than 2,300 milligrams (mg) of sodium each day. If you have high blood pressure (hypertension), you may need to limit your sodium to 1,500 mg each day. Follow the tips below to help reduce your sodium intake. What are tips for eating less sodium? Reading food labels  Check the food label before buying or using packaged ingredients. Always check the label for the serving size and sodium content. Look for products with no more than 140 mg of sodium in one serving. Check the % Daily Value column to see what percent of the daily recommended amount of sodium is provided in one serving of the product. Foods with 5% or less in this column are considered low in sodium. Foods with 20% or higher are considered high in sodium. Do not choose foods with salt as one of the first three ingredients on the ingredients list. If salt is one of the first three ingredients, it usually means the item is high in sodium. Shopping Buy sodium-free or low-sodium products. Look for the following words on food labels: Low-sodium. Sodium-free. Reduced-sodium. No salt added. Unsalted. Always check the sodium content  even if foods are labeled as low-sodium or no salt added. Buy fresh foods. Cooking Use herbs, seasonings without salt, and spices as substitutes for salt. Use sodium-free baking soda when baking. Grill, braise, or roast foods to add flavor with less salt. Avoid adding salt to pasta, rice, or hot cereals. Drain and rinse canned vegetables, beans, and meat before use. Avoid adding salt when cooking sweets and  desserts. Cook with low-sodium ingredients. What foods are high in sodium? Vegetables Regular canned vegetables (not low-sodium or reduced-sodium). Sauerkraut, pickled vegetables, and relishes. Olives. [place] fries. Onion rings. Regular canned tomato sauce and paste. Regular tomato and vegetable juice. Frozen vegetables in sauces. Grains Instant hot cereals. Bread stuffing, pancake, and biscuit mixes. Croutons. Seasoned rice or pasta mixes. Noodle soup cups. Boxed or frozen macaroni and cheese. Regular salted crackers. Self-rising flour. Rolls. Bagels. Flour tortillas and wraps. Meats and other proteins Meat or fish that is salted, canned, smoked, cured, spiced, or pickled. This includes bacon, ham, sausages, hot dogs, corned beef, chipped beef, meat loaves, salt pork, jerky, pickled herring, anchovies, regular canned tuna, and sardines. Salted nuts. Dairy Processed cheese and cheese spreads. Cheese curds. Blue cheese. Feta cheese. String cheese. Regular cottage cheese. Buttermilk. Canned milk. The items listed above may not be a complete list of foods high in sodium. Actual amounts of sodium may be different depending on processing. Contact a dietitian for more information. What foods are low in sodium? Fruits Fresh, frozen, or canned fruit with no sauce added. Fruit juice. Vegetables Fresh or frozen vegetables with no sauce added. "No salt added" canned vegetables. "No salt added" tomato sauce and paste. Low-sodium or reduced-sodium tomato and vegetable juice. Grains Noodles, pasta, quinoa, rice. Shredded or puffed wheat or puffed rice. Regular or quick oats (not instant). Low-sodium crackers. Low-sodium bread. Whole-grain bread and whole-grain pasta. Unsalted popcorn. Meats and other proteins Fresh or frozen whole meats, poultry (not injected with sodium), and fish with no sauce added. Unsalted nuts. Dried peas, beans, and lentils without added salt. Unsalted canned beans. Eggs. Unsalted nut  butters. Low-sodium canned tuna or chicken. Dairy Milk. Soy milk. Yogurt. Low-sodium cheeses, such as Swiss, Monterey Jack, [place], and [employer]. Sherbet or ice cream (keep to  cup per serving). Cream cheese. Fats and oils Unsalted butter or margarine. Other foods Homemade pudding. Sodium-free baking soda and baking powder. Herbs and spices. Low-sodium seasoning mixes. Beverages Coffee and tea. Carbonated beverages. The items listed above may not be a complete list of foods low in sodium. Actual amounts of sodium may be different depending on processing. Contact a dietitian for more information. What are some salt alternatives when cooking? The following are herbs, seasonings, and spices that can be used instead of salt to flavor your food. Herbs should be fresh or dried. Do not choose packaged mixes. Next to the name of the herb, spice, or seasoning are some examples of foods you can pair it with. Herbs [place] leaves - Soups, meat and vegetable dishes, and spaghetti sauce. Basil - [employer], soups, pasta, and fish dishes. Cilantro - Meat, poultry, and vegetable dishes. Chili powder - Marinades and Mexican dishes. Chives - Salad dressings and potato dishes. Cumin - Mexican dishes, couscous, and meat dishes. Dill - Fish dishes, sauces, and salads. Fennel - Meat and vegetable dishes, breads, and cookies. Garlic (do not use garlic salt) - [place] dishes, meat dishes, salad dressings, and sauces. Marjoram - Soups, potato dishes, and meat dishes. Oregano - Pizza and spaghetti sauce. Parsley -  Salads, soups, pasta, and meat dishes. Rosemary - [place] dishes, salad dressings, soups, and red meats. Saffron - Fish dishes, pasta, and some poultry dishes. Sage - Stuffings and sauces. Tarragon - Fish and [employer]. Thyme - Stuffing, meat, and fish dishes. Seasonings Lemon juice - Fish dishes, poultry dishes, vegetables, and salads. Vinegar - Salad dressings, vegetables, and fish  dishes. Spices Cinnamon - Sweet dishes, such as cakes, cookies, and puddings. Cloves - Gingerbread, puddings, and marinades for meats. Curry - Vegetable dishes, fish and poultry dishes, and stir-fry dishes. Ginger - Vegetable dishes, fish dishes, and stir-fry dishes. Nutmeg - Pasta, vegetables, poultry, fish dishes, and custard. Summary Cooking with less salt is one way to reduce the amount of sodium that you get from food. Buy sodium-free or low-sodium products. Check the food label before using or buying packaged ingredients. Use herbs, seasonings without salt, and spices as substitutes for salt in foods. This information is not intended to replace advice given to you by your health care provider. Make sure you discuss any questions you have with your health care provider. Document Revised: 06/13/2019 Document Reviewed: 06/13/2019 Elsevier Patient Education  [place].

## 2021-04-01 NOTE — Progress Notes (Signed)
Follow-up Outpatient Visit Date: 04/01/2021  Primary Care Provider: Leone Haven, MD [address] [place] [address]  Chief Complaint: Follow-up heart failure and PAD  HPI:  Kelsey Mason is a 80 y.o. female with history of  nonobstructive coronary artery disease, peripheral vascular disease with small vessel disease involving the runoff vessels, nonischemic cardiomyopathy, mitral regurgitation, hypertension, hyperlipidemia, type 2 diabetes mellitus, mild to moderate renal artery stenosis, and breast cancer, who presents for follow-up of coronary artery disease, PAD, and heart failure.  I last saw Kelsey Mason in March, at which time she was doing fairly well.  At that time, she was taking both propranolol and carvedilol.  We agreed to stop propranolol.  Today, Kelsey Mason reports that she is feeling fairly well.  She continues to notice some tightening in her calves when she dances for an extended period.  This is stable for at least the last few years.  She is still able to mow her lawn with a push mower as well as do weed eating.  Her biggest limiting factor is back pain due to degenerative disc disease.  She denies chest pain and shortness of breath.  She has been needing to use her as needed furosemide almost daily for leg swelling, which she attributes to increased sodium intake (chips and onion rings).  Home blood pressures are typically better than today's reading in the office, usually between 110-130 mmHg.  --------------------------------------------------------------------------------------------------  Cardiovascular History & Procedures: Cardiovascular Problems: Non-ischemic cardiomyopathy Mitral regurgitation Claudication   Risk Factors: HTN, HLD, DM2, and age > 80   Cath/PCI: LHC (04/21/2018): LMCA normal.  LAD with 50% mid vessel stenosis.  LCx proper without disease.  Dominant OM3 with 50-60% proximal stenosis.  RCA with 50% ostial stenosis.  Mildly  elevated LVEDP (15 to 20 mmHg). Abdominal aortogram and runoff (04/21/2018): Aorta, inflow vessels, and outflow vessels.  There is tapering/occlusion of the distal anterior tibial and peroneal arteries on the right.  There is three-vessel runoff on the left.   CV Surgery: None   EP Procedures and Devices: None   Non-Invasive Evaluation(s): TTE (01/30/2020): Normal LV size with LVEF 40% and global hypokinesis.  Grade 1 diastolic dysfunction with elevated filling pressure.  Low normal RV function with normal size and wall thickness.  Mild left atrial enlargement.  Mild to moderate mitral regurgitation. TTE (03/14/18): Mildly dilated LV with LVEF 40-45%.  Global hypokinesis with grade 2 diastolic dysfunction.  Moderate MR and mild LA enlargement.  Normal RV size and function. ABIs (02/22/2018): Noncompressible runoff vessels bilaterally with triphasic waveforms.  TBI's are normal bilaterally. Renal artery Doppler (04/20/17): Mild to moderate (less than 60%) stenosis of the right renal artery.  No significant left renal artery disease. TTE (01/14/17): Normal LV size with LVEF 50-55%. Anteroseptal hypokinesis is noted, as well as grade 1 diastolic dysfunction. Mild to moderate MR. Normal RV size and function. Mild pulmonary hypertension. Pharmacologic myocardial perfusion stress test (06/16/16): Low risk study with moderate in size, moderate in severity, fixed defect involving the septum most likely related to LBBB. No evidence of ischemia. LVEF 50% by automated calculation and likely higher based on visual estimation. ABIs (06/10/16): ABIs: Right not obtainable, left 1.4. TBIs right 1.1, left not obtainable. Bilateral great toe PPG's are normal. Bilateral common femoral, popliteal, peroneal, anterior tibial, and posterior tibial artery waveforms are brisk and triphasic. TTE (02/23/16, [facility]): Mildly dilated left ventricle with mild to moderate LV dysfunction (EF 35-45%). Grade 1 diastolic  dysfunction. Moderate mitral regurgitation. Mild left atrial enlargement. Normal RV function. TTE (01/01/14, [facility]): Moderate to severe LV dysfunction (EF 30%) with mild LVH and mild left ventricular dilation. Mitral annular calcification with moderate MR and moderate TR noted. Normal right ventricular contraction. MUGA (12/25/12): Normal contraction and wall motion. EF 63%.  Recent CV Pertinent Labs: Lab Results  Component Value Date   CHOL 151 11/06/2019   CHOL 156 08/01/2019   HDL 47.10 11/06/2019   HDL 45 08/01/2019   LDLCALC 73 11/06/2019   LDLCALC 88 08/01/2019   LDLDIRECT 60.0 05/08/2020   TRIG 154.0 (H) 11/06/2019   CHOLHDL 3 11/06/2019   K 5.0 11/14/2020   K 4.7 01/07/2014   BUN 14 11/14/2020   BUN 19 02/19/2019   BUN 19 (H) 01/07/2014   CREATININE 1.39 (H) 11/14/2020   CREATININE 1.24 01/07/2014    Past medical and surgical history were reviewed and updated in EPIC.  Current Meds  Medication Sig   aspirin 81 MG tablet Take 81 mg by mouth daily.   blood glucose meter kit and supplies KIT Dispense based on patient and insurance preference. Use up to four times daily as directed. One Touch Ultra mini meter (FOR ICD-9 250.00, 250.01). Dx: E11.9   carvedilol (COREG) 12.5 MG tablet TAKE 1 TABLET BY MOUTH TWICE A DAY   cholecalciferol (VITAMIN D) 25 MCG (1000 UT) tablet Take 1,000 Units by mouth daily.   dapagliflozin propanediol (FARXIGA) 10 MG TABS tablet Take 10 mg by mouth daily.   furosemide (LASIX) 20 MG tablet TAKE 1 TABLET (20 MG TOTAL) BY MOUTH DAILY AS NEEDED (FOR >2LB WEIGHT GAIN OVERNIGHT OR 5 LB WEIGHT GAIN IN 1 WEEK OR SWELLING.).   glucose blood (ONE TOUCH ULTRA TEST) test strip USE TO TEST BLOOD SUGAR UP TO 4 TIMES DAILY   insulin glargine (LANTUS SOLOSTAR) 100 UNIT/ML Solostar Pen Inject 40 Units into the skin daily.   loratadine-pseudoephedrine (CLARITIN-D 24-HOUR) 10-240 MG 24 hr tablet Take 1 tablet by mouth daily.   losartan (COZAAR) 100 MG tablet  TAKE 1/2 TABLET (50 MG TOTAL) BY MOUTH 2 (TWO) TIMES DAILY.   nitroGLYCERIN (NITROSTAT) 0.4 MG SL tablet PLACE 1 TABLET (0.4 MG TOTAL) UNDER THE TONGUE EVERY 5 (FIVE) MINUTES AS NEEDED FOR CHEST PAIN. MAXIMUM OF 3 DOSES.   NOVOFINE PEN NEEDLE 32G X 6 MM MISC USE AS DIRECTED WITH LANTUS PEN   rosuvastatin (CRESTOR) 20 MG tablet Take 1 tablet (20 mg total) by mouth daily.   TRULICITY 1.5 OI/3.7CW SOPN Inject 1.5 mg as directed once a week.   vitamin B-12 (CYANOCOBALAMIN) 1000 MCG tablet Take 1,000 mcg by mouth daily.    Allergies: Advil [ibuprofen], Aleve [naproxen], Fluticasone-salmeterol, Lipitor [atorvastatin], Naproxen sodium, and Penicillins  Social History   Tobacco Use   Smoking status: Former    Packs/day: 0.25    Years: 1.00    Pack years: 0.25    Types: Cigarettes    Quit date: 1963    Years since quitting: 59.7   Smokeless tobacco: Never  Vaping Use   Vaping Use: Never used  Substance Use Topics   Alcohol use: No    Alcohol/week: 0.0 standard drinks   Drug use: No    Family History  Problem Relation Age of Onset   Alcohol abuse Mother    Heart disease Mother    Hypertension Mother    Breast cancer Mother 79   Alcohol abuse Father    Heart disease Father  Hypertension Father    Heart disease Brother    Heart attack Brother     Review of Systems: A 12-system review of systems was performed and was negative except as noted in the HPI.  --------------------------------------------------------------------------------------------------  Physical Exam: BP (!) 146/64 (BP Location: Left Arm, Patient Position: Sitting, Cuff Size: Normal)   Pulse 64   Ht 5' 2.5" (1.588 m)   Wt 157 lb (71.2 kg)   SpO2 99%   BMI 28.26 kg/m   General:  NAD. Neck: No JVD or HJR. Lungs: Clear to auscultation bilaterally without wheezes or crackles. Heart: Regular rate and rhythm without murmurs, rubs, or gallops. Abdomen: Soft, nontender, nondistended. Extremities: No lower  extremity edema.  Trace pedal pulses bilaterally.  EKG: Normal sinus rhythm with left bundle branch block.  No significant change since 09/26/2020.  Lab Results  Component Value Date   WBC 10.5 11/14/2020   HGB 11.8 (L) 11/14/2020   HCT 34.6 (L) 11/14/2020   MCV 91.3 11/14/2020   PLT 164 11/14/2020    Lab Results  Component Value Date   NA 135 11/14/2020   K 5.0 11/14/2020   CL 103 11/14/2020   CO2 25 11/14/2020   BUN 14 11/14/2020   CREATININE 1.39 (H) 11/14/2020   GLUCOSE 152 (H) 11/14/2020   ALT 20 11/14/2020    Lab Results  Component Value Date   CHOL 151 11/06/2019   HDL 47.10 11/06/2019   LDLCALC 73 11/06/2019   LDLDIRECT 60.0 05/08/2020   TRIG 154.0 (H) 11/06/2019   CHOLHDL 3 11/06/2019    --------------------------------------------------------------------------------------------------  ASSESSMENT AND PLAN: Chronic HFrEF due to nonischemic cardiomyopathy: Kelsey Mason reports increased furosemide use over the last few weeks, which I suspect is due to increased sodium intake.  She appears euvolemic on exam today with NYHA class II symptoms.  She is most limited by back pain.  Her blood pressure is mildly elevated today but typically better at home.  We have agreed to defer medication changes, continuing her current regimen of carvedilol, losartan, and dapagliflozin.  Nonobstructive coronary artery disease: No angina reported.  Continue medical therapy with aspirin and rosuvastatin to prevent progression of disease.  PAD: Kelsey Mason has relatively stable Rutherford class II claudication with prior aortogram and runoff in 2019 demonstrating primarily small vessel disease involving the distal runoff vessels.  I have encouraged her to remain active doing to try to walk regularly (this is somewhat limited by chronic back pain).  We will defer additional testing and medication changes at this time.  Hyperlipidemia associated with type 2 diabetes mellitus: Hemoglobin A1c  just above goal on last check with Dr. Gabriel Carina at 7.2.  Lipids in June were at goal with LDL and triglycerides of 56 and 118, respectively.  Continue current regimen of rosuvastatin and ongoing diabetes management per Dr. Gabriel Carina.  Chronic kidney disease stage IIIb: Most recent BMP approximately 2 weeks ago through [place] [facility] showed stable renal function with a creatinine of 1.4 and GFR of 36.  Continue current medications and ongoing follow-up with nephrology.  Avoid nephrotoxic agents.  Hypertension: Blood pressure mildly elevated today but typically better at home.  We discussed the importance of sodium restriction.  Defer medication changes today.  Follow-up: Return to clinic in 6 months.  Nelva Bush, MD 04/01/2021 9:36 AM

## 2021-04-20 ENCOUNTER — Ambulatory Visit: Payer: Medicare PPO

## 2021-05-13 ENCOUNTER — Other Ambulatory Visit: Payer: Self-pay

## 2021-05-13 ENCOUNTER — Ambulatory Visit
Admission: RE | Admit: 2021-05-13 | Discharge: 2021-05-13 | Disposition: A | Discharge status: Home / Self Care | Payer: Medicare PPO | Source: Ambulatory Visit | Attending: Oncology | Admitting: Oncology

## 2021-05-13 DIAGNOSIS — Z1231 Encounter for screening mammogram for malignant neoplasm of breast: Secondary | ICD-10-CM | POA: Diagnosis not present

## 2021-05-13 DIAGNOSIS — Z853 Personal history of malignant neoplasm of breast: Secondary | ICD-10-CM

## 2021-05-15 ENCOUNTER — Inpatient Hospital Stay: Payer: Medicare PPO | Admitting: Nurse Practitioner

## 2021-05-15 ENCOUNTER — Ambulatory Visit: Payer: Medicare PPO | Admitting: Oncology

## 2021-05-15 ENCOUNTER — Other Ambulatory Visit: Payer: Self-pay

## 2021-05-15 ENCOUNTER — Inpatient Hospital Stay: Payer: Medicare PPO | Attending: Nurse Practitioner

## 2021-05-15 VITALS — BP 141/59 | HR 69 | Temp 97.2°F | Resp 16 | Wt 154.7 lb

## 2021-05-15 DIAGNOSIS — Z853 Personal history of malignant neoplasm of breast: Secondary | ICD-10-CM | POA: Diagnosis not present

## 2021-05-15 DIAGNOSIS — M810 Age-related osteoporosis without current pathological fracture: Secondary | ICD-10-CM | POA: Diagnosis not present

## 2021-05-15 DIAGNOSIS — N184 Chronic kidney disease, stage 4 (severe): Secondary | ICD-10-CM

## 2021-05-15 DIAGNOSIS — Z08 Encounter for follow-up examination after completed treatment for malignant neoplasm: Secondary | ICD-10-CM

## 2021-05-15 LAB — CBC WITH DIFFERENTIAL/PLATELET
Abs Immature Granulocytes: 0.03 10*3/uL (ref 0.00–0.07)
Basophils Absolute: 0.1 10*3/uL (ref 0.0–0.1)
Basophils Relative: 1 %
Eosinophils Absolute: 0.4 10*3/uL (ref 0.0–0.5)
Eosinophils Relative: 5 %
HCT: 36.2 % (ref 36.0–46.0)
Hemoglobin: 12.1 g/dL (ref 12.0–15.0)
Immature Granulocytes: 0 %
Lymphocytes Relative: 18 %
Lymphs Abs: 1.6 10*3/uL (ref 0.7–4.0)
MCH: 29.7 pg (ref 26.0–34.0)
MCHC: 33.4 g/dL (ref 30.0–36.0)
MCV: 88.9 fL (ref 80.0–100.0)
Monocytes Absolute: 0.5 10*3/uL (ref 0.1–1.0)
Monocytes Relative: 6 %
Neutro Abs: 6.2 10*3/uL (ref 1.7–7.7)
Neutrophils Relative %: 70 %
Platelets: 164 10*3/uL (ref 150–400)
RBC: 4.07 MIL/uL (ref 3.87–5.11)
RDW: 12.1 % (ref 11.5–15.5)
WBC: 8.7 10*3/uL (ref 4.0–10.5)
nRBC: 0 % (ref 0.0–0.2)

## 2021-05-15 LAB — COMPREHENSIVE METABOLIC PANEL
ALT: 19 U/L (ref 0–44)
AST: 18 U/L (ref 15–41)
Albumin: 4.5 g/dL (ref 3.5–5.0)
Alkaline Phosphatase: 68 U/L (ref 38–126)
Anion gap: 7 (ref 5–15)
BUN: 22 mg/dL (ref 8–23)
CO2: 26 mmol/L (ref 22–32)
Calcium: 9.2 mg/dL (ref 8.9–10.3)
Chloride: 100 mmol/L (ref 98–111)
Creatinine, Ser: 1.31 mg/dL — ABNORMAL HIGH (ref 0.44–1.00)
GFR, Estimated: 41 mL/min — ABNORMAL LOW (ref >60)
Glucose, Bld: 103 mg/dL — ABNORMAL HIGH (ref 70–99)
Potassium: 4.4 mmol/L (ref 3.5–5.1)
Sodium: 133 mmol/L — ABNORMAL LOW (ref 135–145)
Total Bilirubin: 0.8 mg/dL (ref 0.3–1.2)
Total Protein: 6.7 g/dL (ref 6.5–8.1)

## 2021-05-15 MED ORDER — ALENDRONATE SODIUM 70 MG PO TABS: 70.0000 mg | ORAL_TABLET | ORAL | 1 refills | Status: DC

## 2021-05-15 NOTE — Progress Notes (Signed)
[place] at [facility] day:  05/15/2021  Chief Complaint: Kelsey Mason is a 80 y.o. female with bilateral stage IA breast cancer who is seen for 6 month assessment on Femara.  PERTINENT ONCOLOGY HISTORY Kelsey Mason is a 80 y.o.afemale who has above oncology history reviewed by me today presented for follow up visit for management of history of bilateral breast cancer. Patient previously follows up with Dr. Mike Gip. Switched care to me on 09/18/2018. Medical record review was performed by me.  bilateral breast cancer s/p lumpectomy and sentinel lymph node biopsy on 11/17/2012.  Right breast revealed a 5 mm grade I invasive carcinoma.  There was no DCIS.  Two sentinel lymph nodes were negative.  Pathologic stage was T1aN0M0.  Tumor was ER + (90%), PR + (20%), and Her2/neu -.   Left breast revealed a 1.8 cm grade III invasive ductal carcinoma.  There was lymphovascular invasion.  There was no DCIS.  One sentinel lymph node was negative.  Pathologic stage was T1cN0M0.  Tumor was ER + (>90%), PR + (1-5%), and Her2/neu -.  Oncotype DX testing on the left breast mass revealed a recurrence score of 28 which corresponded to a 10 year risk of distant recurrence of 19% (CI 15-23%) with tamoxifen alone.  BCI testing on 10/04/2017 revealed a high risk of late recurrence (5.4%; CI 2.5%-8.2%) during years 5-10.  There was a low likelihood of benefit from extended endocrine therapy.  She received Adriamycin and Cytoxan (AC) x 4 cycles followed by weekly Taxol x 10 (completed 06/05/2013).  She did not receive 2 cycles of Taxol secondary to progressive neuropathy.  She received bilateral breast radiation (07/2013 - 09/2013).  She began letrozole (Femara) in 09/2013.  CA27.29 has been followed:  24.4 on 09/10/2016, 28.1 on 03/14/2017, 24.9 on 09/12/2017, and 22.8 on 03/13/2018. She has a history of a borderline mild normocytic anemia.  Diet appears modest.   She denies any melena, hematochezia, hematuria or vaginal bleeding. Colonoscopy on 10/13/2015 was negative.    Soft tissue neck ultrasound on 03/16/2017 revealed no fluid collection or soft tissue lesion.  09/2013- 12/2019 Letrozole, which was discontinued due to worsening of osteoporosis and  Her BCI was obtained previously 10/04/2017 revealed a high risk of late recurrence (5.4%; CI 2.5%-8.2%) during years 5-10.  There was a low likelihood of benefit from extended endocrine therapy bone density done on 12/20/2019 Her T score was -2.3 in 2019 now left femoral neck T score at -2.5. She has progressed to osteoporosis state. I have previously discussed with patient about dental clearance and bisphosphonate use.  At that time, patient decided not to proceed with intervention due to COVID-19 pandemic    Kelsey Mason is a 80 y.o. female with above history of breast cancer who returns to clinic for routine surveillance of bilateral breast cancer.  She continues Femara.  Has chronic aches and pains but otherwise tolerating well without significant side effects.  No new complaints.  No new lumps or bumps.  No breast complaints.  No bone pain.  Review of Systems  Constitutional:  Negative for appetite change, chills, fatigue and fever.  HENT:   Negative for hearing loss and voice change.   Eyes:  Negative for eye problems.  Respiratory:  Negative for chest tightness and cough.   Cardiovascular:  Negative for chest pain.  Gastrointestinal:  Negative for abdominal distention, abdominal pain, blood in stool and nausea.  Endocrine: Negative for hot  flashes.  Genitourinary:  Negative for difficulty urinating and frequency.   Musculoskeletal:  Positive for arthralgias.  Skin:  Negative for itching and rash.  Neurological:  Negative for extremity weakness and headaches.  Hematological:  Negative for adenopathy.  Psychiatric/Behavioral:  Negative for confusion.     Past  Medical History:  Diagnosis Date   Arthritis    fingers   Breast symptom 2014   Cancer [facility]) 2014   Bilateral Breast, chemo Dr. Jeb Levering   Degenerative disc disease, lumbar    Diabetes mellitus    GERD (gastroesophageal reflux disease)    mild   Hypertension    Personal history of chemotherapy    Personal history of radiation therapy 2014   bilat lumpectomy    Past Surgical History:  Procedure Laterality Date   ABDOMINAL AORTOGRAM W/LOWER EXTREMITY N/A 04/21/2018   Procedure: ABDOMINAL AORTOGRAM W/LOWER EXTREMITY;  Surgeon: Nelva Bush, MD;  Location: [place] [facility];  Service: Cardiovascular;  Laterality: N/A;   ABDOMINAL HYSTERECTOMY  1976   for pelvic pain   BREAST EXCISIONAL BIOPSY Bilateral 11/2012   bilat breast ca chemo and rad   BREAST SURGERY Bilateral 2014   bilateral lumpectomy and SN biopsy   CATARACT EXTRACTION W/PHACO Left 07/30/2020   Procedure: CATARACT EXTRACTION PHACO AND INTRAOCULAR LENS PLACEMENT (IOC) LEFT DIABETIC 18.73 01:45.0 17.8%;  Surgeon: Leandrew Koyanagi, MD;  Location: [place];  Service: Ophthalmology;  Laterality: Left;  Diabetic - insulin and opral meds   CATARACT EXTRACTION W/PHACO Right 08/13/2020   Procedure: CATARACT EXTRACTION PHACO AND INTRAOCULAR LENS PLACEMENT ([place]) RIGHT DIABETIC;  Surgeon: Leandrew Koyanagi, MD;  Location: [place];  Service: Ophthalmology;  Laterality: Right;  10.67 1:27.8 12.1%   CHOLECYSTECTOMY  2007   COLONOSCOPY WITH PROPOFOL N/A 10/13/2015   Procedure: COLONOSCOPY WITH PROPOFOL;  Surgeon: Manya Silvas, MD;  Location: [facility];  Service: Endoscopy;  Laterality: N/A;   LEFT HEART CATH AND CORONARY ANGIOGRAPHY N/A 04/21/2018   Procedure: LEFT HEART CATH AND CORONARY ANGIOGRAPHY;  Surgeon: Nelva Bush, MD;  Location: [place] [facility];  Service: Cardiovascular;  Laterality: N/A;   OOPHORECTOMY Right    PORTACATH PLACEMENT  2014   TUMOR REMOVAL Right 1980   right foot     Family History  Problem Relation Age of Onset   Alcohol abuse Mother    Heart disease Mother    Hypertension Mother    Breast cancer Mother 43   Alcohol abuse Father    Heart disease Father    Hypertension Father    Heart disease Brother    Heart attack Brother    Social History   Socioeconomic History   Marital status: Widowed    Spouse name: Not on file   Number of children: Not on file   Years of education: Not on file   Highest education level: Not on file  Occupational History   Not on file  Tobacco Use   Smoking status: Former    Packs/day: 0.25    Years: 1.00    Pack years: 0.25    Types: Cigarettes    Quit date: 1963    Years since quitting: 59.9   Smokeless tobacco: Never  Vaping Use   Vaping Use: Never used  Substance and Sexual Activity   Alcohol use: No    Alcohol/week: 0.0 standard drinks   Drug use: No   Sexual activity: Never  Other Topics Concern   Not on file  Social History Narrative   Lives  in [place].       Works - [employer]   Diet - regular   Exercise - dance 3x per week   Social Determinants of Radio broadcast assistant Strain: Not on file  Food Insecurity: Not on file  Transportation Needs: Not on file  Physical Activity: Not on file  Stress: Not on file  Social Connections: Not on file  Intimate Partner Violence: Not on file   She lives by herself in [place].  Husband passed away many years ago.   Allergies:  Allergies  Allergen Reactions   Advil [Ibuprofen] Other (See Comments)    Dizziness    Aleve [Naproxen] Other (See Comments)    Dizziness   Fluticasone-Salmeterol Other (See Comments)    dizziness   Lipitor [Atorvastatin] Other (See Comments)    Legs weak.  Leg pain, muscle ache   Naproxen Sodium Other (See Comments)   Penicillins Hives and Other (See Comments)    Has patient had a PCN reaction causing immediate rash, facial/tongue/throat swelling, SOB or lightheadedness with hypotension:  YES Has patient had a PCN reaction causing severe rash involving mucus membranes or skin necrosis: no Has patient had a PCN reaction that required hospitalization: no Has patient had a PCN reaction occurring within the last 10 years: no If all of the above answers are "NO", then may proceed with Cephalosporin use.     Current Medications: Current Outpatient Medications  Medication Sig Dispense Refill   aspirin 81 MG tablet Take 81 mg by mouth daily.     blood glucose meter kit and supplies KIT Dispense based on patient and insurance preference. Use up to four times daily as directed. One Touch Ultra mini meter (FOR ICD-9 250.00, 250.01). Dx: E11.9 1 each 0   carvedilol (COREG) 12.5 MG tablet TAKE 1 TABLET BY MOUTH TWICE A DAY 180 tablet 0   cholecalciferol (VITAMIN D) 25 MCG (1000 UT) tablet Take 1,000 Units by mouth daily.     dapagliflozin propanediol (FARXIGA) 10 MG TABS tablet Take 10 mg by mouth daily.     furosemide (LASIX) 20 MG tablet TAKE 1 TABLET (20 MG TOTAL) BY MOUTH DAILY AS NEEDED (FOR >2LB WEIGHT GAIN OVERNIGHT OR 5 LB WEIGHT GAIN IN 1 WEEK OR SWELLING.). 90 tablet 3   glucose blood (ONE TOUCH ULTRA TEST) test strip USE TO TEST BLOOD SUGAR UP TO 4 TIMES DAILY 100 each 6   insulin glargine (LANTUS SOLOSTAR) 100 UNIT/ML Solostar Pen Inject 40 Units into the skin daily.     loratadine-pseudoephedrine (CLARITIN-D 24-HOUR) 10-240 MG 24 hr tablet Take 1 tablet by mouth daily.     losartan (COZAAR) 100 MG tablet TAKE 1/2 TABLET (50 MG TOTAL) BY MOUTH 2 (TWO) TIMES DAILY. 90 tablet 1   nitroGLYCERIN (NITROSTAT) 0.4 MG SL tablet PLACE 1 TABLET (0.4 MG TOTAL) UNDER THE TONGUE EVERY 5 (FIVE) MINUTES AS NEEDED FOR CHEST PAIN. MAXIMUM OF 3 DOSES. 25 tablet 3   NOVOFINE PEN NEEDLE 32G X 6 MM MISC USE AS DIRECTED WITH LANTUS PEN 100 each 2   rosuvastatin (CRESTOR) 20 MG tablet Take 1 tablet (20 mg total) by mouth daily. 90 tablet 3   TRULICITY 1.5 AX/6.5VV SOPN Inject 1.5 mg as directed once  a week.  11   vitamin B-12 (CYANOCOBALAMIN) 1000 MCG tablet Take 1,000 mcg by mouth daily.     No current facility-administered medications for this visit.   Physical Exam: Blood pressure (!) 141/59, pulse 69, temperature (!)  97.2 F (36.2 C), resp. rate 16, weight 154 lb 11.2 oz (70.2 kg), SpO2 100 %. Physical Exam Constitutional:      Appearance: She is not ill-appearing.  Eyes:     General: No scleral icterus.    Conjunctiva/sclera: Conjunctivae normal.  Cardiovascular:     Rate and Rhythm: Normal rate and regular rhythm.  Pulmonary:     Effort: No respiratory distress.  Chest:     Comments: Breast exam performed in seated and lying position. She is s/p bilateral lumpectomy. Well healed surgical scars. Chronic bilateral nipple retraction. No palpable masses, erythema, or axillary adenopathy. Chronic scarring of right nipple is stable per patient.  Lymphadenopathy:     Cervical: No cervical adenopathy.  Skin:    General: Skin is warm and dry.  Neurological:     Mental Status: She is alert and oriented to person, place, and time.  Psychiatric:        Mood and Affect: Mood normal.        Behavior: Behavior normal.    Appointment on 05/15/2021  Component Date Value Ref Range Status   Sodium 05/15/2021 133 (A)  135 - 145 mmol/L Final   Potassium 05/15/2021 4.4  3.5 - 5.1 mmol/L Final   Chloride 05/15/2021 100  98 - 111 mmol/L Final   CO2 05/15/2021 26  22 - 32 mmol/L Final   Glucose, Bld 05/15/2021 103 (A)  70 - 99 mg/dL Final   Glucose reference range applies only to samples taken after fasting for at least 8 hours.   BUN 05/15/2021 22  8 - 23 mg/dL Final   Creatinine, Ser 05/15/2021 1.31 (A)  0.44 - 1.00 mg/dL Final   Calcium 05/15/2021 9.2  8.9 - 10.3 mg/dL Final   Total Protein 05/15/2021 6.7  6.5 - 8.1 g/dL Final   Albumin 05/15/2021 4.5  3.5 - 5.0 g/dL Final   AST 05/15/2021 18  15 - 41 U/L Final   ALT 05/15/2021 19  0 - 44 U/L Final   Alkaline Phosphatase 05/15/2021  68  38 - 126 U/L Final   Total Bilirubin 05/15/2021 0.8  0.3 - 1.2 mg/dL Final   GFR, Estimated 05/15/2021 41 (A)  >60 mL/min Final   Comment: (NOTE) Calculated using the CKD-EPI Creatinine Equation (2021)    Anion gap 05/15/2021 7  5 - 15 Final   Performed at [facility], [place], [place] [address]   WBC 05/15/2021 8.7  4.0 - 10.5 K/uL Final   RBC 05/15/2021 4.07  3.87 - 5.11 MIL/uL Final   Hemoglobin 05/15/2021 12.1  12.0 - 15.0 g/dL Final   HCT 05/15/2021 36.2  36.0 - 46.0 % Final   MCV 05/15/2021 88.9  80.0 - 100.0 fL Final   MCH 05/15/2021 29.7  26.0 - 34.0 pg Final   MCHC 05/15/2021 33.4  30.0 - 36.0 g/dL Final   RDW 05/15/2021 12.1  11.5 - 15.5 % Final   Platelets 05/15/2021 164  150 - 400 K/uL Final   nRBC 05/15/2021 0.0  0.0 - 0.2 % Final   Neutrophils Relative % 05/15/2021 70  % Final   Neutro Abs 05/15/2021 6.2  1.7 - 7.7 K/uL Final   Lymphocytes Relative 05/15/2021 18  % Final   Lymphs Abs 05/15/2021 1.6  0.7 - 4.0 K/uL Final   Monocytes Relative 05/15/2021 6  % Final   Monocytes Absolute 05/15/2021 0.5  0.1 - 1.0 K/uL Final   Eosinophils Relative 05/15/2021 5  %  Final   Eosinophils Absolute 05/15/2021 0.4  0.0 - 0.5 K/uL Final   Basophils Relative 05/15/2021 1  % Final   Basophils Absolute 05/15/2021 0.1  0.0 - 0.1 K/uL Final   Immature Granulocytes 05/15/2021 0  % Final   Abs Immature Granulocytes 05/15/2021 0.03  0.00 - 0.07 K/uL Final   Performed at [facility], [address] [place], [place] [address]   RADIOGRAPHIC STUDIES: I have personally reviewed the radiological images as listed and agreed with the findings in the report. MM 3D SCREEN BREAST BILATERAL  Result Date: 05/13/2021 CLINICAL DATA:  Screening. EXAM: DIGITAL SCREENING BILATERAL MAMMOGRAM WITH TOMOSYNTHESIS AND CAD TECHNIQUE: Bilateral screening digital craniocaudal and mediolateral oblique mammograms were obtained. Bilateral screening digital breast tomosynthesis was  performed. The images were evaluated with computer-aided detection. COMPARISON:  Previous exam(s). ACR Breast Density Category b: There are scattered areas of fibroglandular density. FINDINGS: There are no findings suspicious for malignancy. IMPRESSION: No mammographic evidence of malignancy. A result letter of this screening mammogram will be mailed directly to the patient. RECOMMENDATION: Screening mammogram in one year. (Code:SM-B-01Y) BI-RADS CATEGORY  1: Negative. Electronically Signed   By: Franki Cabot M.D.   On: 05/13/2021 16:28   Assessment:  Kelsey Mason is a 80 y.o. female presents for follow-up of bilateral stage I breast cancer.  No diagnosis found.  1.  History of breast cancer-diagnosed in 2014. BCI was obtained in 2019 revealed high risk of recurrence.  Likelihood of benefit from extended endocrine therapy. She completed 5 years of adjuvant femara in 2019 and declined additional treatment. Clinically she is asymptomatic. Negative exam today. I independently reviewed imaging from 05/13/21 which was reported as bi-rads category 1: negative. Recommend repeating mammogram in 05/2022.   2.  Osteoporosis-left femoral neck.  T score is now -2.5.  Prolia was recommended but declined. Discussed alternatives today including fosamax 70 mg weekly. Lengthy conversation regarding use of this medications, risks, benefits, potential costs, and administration. Advised to take on an empty stomach, stay upright for 30 minutes and until first food of day. Take with full glass of water. She agreed to start fosamax.  3.  CKD-creatinine's are stable. eGFR 41. Encouraged hydration. Avoid fosamax if gfr drops below 35 ml/minute.   Follow up in 1 month for virtual visit to assess tolerance of fosamax RTC in 6 months for labs (cbc, cmp, ca27.29), see Dr Tasia Catchings- la  Beckey Rutter, [place], AGNP-C [place] at [facility] [phone] (clinic) 05/15/2021

## 2021-05-15 NOTE — Progress Notes (Signed)
Pt c/o lightheaded and weakness since last Friday. Pt also reports right groin pain since 2 Saturdays ago made worse when standing or bending over. Denies urinary symptoms

## 2021-05-16 LAB — CANCER ANTIGEN 27.29: CA 27.29: 20.4 U/mL (ref 0.0–38.6)

## 2021-06-02 ENCOUNTER — Ambulatory Visit (INDEPENDENT_AMBULATORY_CARE_PROVIDER_SITE_OTHER): Payer: Medicare PPO

## 2021-06-02 VITALS — Ht 62.5 in | Wt 154.0 lb

## 2021-06-02 DIAGNOSIS — R600 Localized edema: Secondary | ICD-10-CM | POA: Diagnosis not present

## 2021-06-02 DIAGNOSIS — N1832 Chronic kidney disease, stage 3b: Secondary | ICD-10-CM | POA: Diagnosis not present

## 2021-06-02 DIAGNOSIS — E1129 Type 2 diabetes mellitus with other diabetic kidney complication: Secondary | ICD-10-CM | POA: Diagnosis not present

## 2021-06-02 DIAGNOSIS — Z0000 Encounter for general adult medical examination without abnormal findings: Secondary | ICD-10-CM

## 2021-06-02 DIAGNOSIS — I10 Essential (primary) hypertension: Secondary | ICD-10-CM | POA: Diagnosis not present

## 2021-06-02 NOTE — Patient Instructions (Addendum)
Kelsey Mason , Thank you for taking time to come for your Medicare Wellness Visit. I appreciate your ongoing commitment to your health goals. Please review the following plan we discussed and let me know if I can assist you in the future.   These are the goals we discussed:  Goals       Patient Stated     I would like to lose 20-30lbs (pt-stated)      Portion control Reduce sugar intake Dancing weekly        This is a list of the screening recommended for you and due dates:  Health Maintenance  Topic Date Due   COVID-19 Vaccine (6 - Booster for [place] series) 05/25/2021   Eye exam for diabetics  05/23/2021   Tetanus Vaccine  06/02/2022*   Complete foot exam   08/05/2021   Hemoglobin A1C  09/14/2021   Flu Shot  Completed   DEXA scan (bone density measurement)  Completed   HPV Vaccine  Aged Out   Pneumonia Vaccine  Discontinued   Zoster (Shingles) Vaccine  Discontinued  *Topic was postponed. The date shown is not the original due date.    Advanced directives: End of life planning; Advance aging; Advanced directives discussed.  Copy of current HCPOA/Living Will requested.    Conditions/risks identified: none new  Follow up in one year for your annual wellness visit    Preventive Care 65 Years and Older, Female Preventive care refers to lifestyle choices and visits with your health care provider that can promote health and wellness. What does preventive care include? A yearly physical exam. This is also called an annual well check. Dental exams once or twice a year. Routine eye exams. Ask your health care provider how often you should have your eyes checked. Personal lifestyle choices, including: Daily care of your teeth and gums. Regular physical activity. Eating a healthy diet. Avoiding tobacco and drug use. Limiting alcohol use. Practicing safe sex. Taking low-dose aspirin every day. Taking vitamin and mineral supplements as recommended by your health care  provider. What happens during an annual well check? The services and screenings done by your health care provider during your annual well check will depend on your age, overall health, lifestyle risk factors, and family history of disease. Counseling  Your health care provider may ask you questions about your: Alcohol use. Tobacco use. Drug use. Emotional well-being. Home and relationship well-being. Sexual activity. Eating habits. History of falls. Memory and ability to understand (cognition). Work and work Statistician. Reproductive health. Screening  You may have the following tests or measurements: Height, weight, and BMI. Blood pressure. Lipid and cholesterol levels. These may be checked every 5 years, or more frequently if you are over 65 years old. Skin check. Lung cancer screening. You may have this screening every year starting at age 22 if you have a 30-pack-year history of smoking and currently smoke or have quit within the past 15 years. Fecal occult blood test (FOBT) of the stool. You may have this test every year starting at age 63. Flexible sigmoidoscopy or colonoscopy. You may have a sigmoidoscopy every 5 years or a colonoscopy every 10 years starting at age 59. Hepatitis C blood test. Hepatitis B blood test. Sexually transmitted disease (STD) testing. Diabetes screening. This is done by checking your blood sugar (glucose) after you have not eaten for a while (fasting). You may have this done every 1-3 years. Bone density scan. This is done to screen for osteoporosis. You may have  this done starting at age 58. Mammogram. This may be done every 1-2 years. Talk to your health care provider about how often you should have regular mammograms. Talk with your health care provider about your test results, treatment options, and if necessary, the need for more tests. Vaccines  Your health care provider may recommend certain vaccines, such as: Influenza vaccine. This is  recommended every year. Tetanus, diphtheria, and acellular pertussis (Tdap, Td) vaccine. You may need a Td booster every 10 years. Zoster vaccine. You may need this after age 61. Pneumococcal 13-valent conjugate (PCV13) vaccine. One dose is recommended after age 23. Pneumococcal polysaccharide (PPSV23) vaccine. One dose is recommended after age 51. Talk to your health care provider about which screenings and vaccines you need and how often you need them. This information is not intended to replace advice given to you by your health care provider. Make sure you discuss any questions you have with your health care provider. Document Released: 07/18/2015 Document Revised: 03/10/2016 Document Reviewed: 04/22/2015 Elsevier Interactive Patient Education  2017 [place] Prevention in the Home Falls can cause injuries. They can happen to people of all ages. There are many things you can do to make your home safe and to help prevent falls. What can I do on the outside of my home? Regularly fix the edges of walkways and driveways and fix any cracks. Remove anything that might make you trip as you walk through a door, such as a raised step or threshold. Trim any bushes or trees on the path to your home. Use bright outdoor lighting. Clear any walking paths of anything that might make someone trip, such as rocks or tools. Regularly check to see if handrails are loose or broken. Make sure that both sides of any steps have handrails. Any raised decks and porches should have guardrails on the edges. Have any leaves, snow, or ice cleared regularly. Use sand or salt on walking paths during winter. Clean up any spills in your garage right away. This includes oil or grease spills. What can I do in the bathroom? Use night lights. Install grab bars by the toilet and in the tub and shower. Do not use towel bars as grab bars. Use non-skid mats or decals in the tub or shower. If you need to sit down in  the shower, use a plastic, non-slip stool. Keep the floor dry. Clean up any water that spills on the floor as soon as it happens. Remove soap buildup in the tub or shower regularly. Attach bath mats securely with double-sided non-slip rug tape. Do not have throw rugs and other things on the floor that can make you trip. What can I do in the bedroom? Use night lights. Make sure that you have a light by your bed that is easy to reach. Do not use any sheets or blankets that are too big for your bed. They should not hang down onto the floor. Have a firm chair that has side arms. You can use this for support while you get dressed. Do not have throw rugs and other things on the floor that can make you trip. What can I do in the kitchen? Clean up any spills right away. Avoid walking on wet floors. Keep items that you use a lot in easy-to-reach places. If you need to reach something above you, use a strong step stool that has a grab bar. Keep electrical cords out of the way. Do not use floor polish or  wax that makes floors slippery. If you must use wax, use non-skid floor wax. Do not have throw rugs and other things on the floor that can make you trip. What can I do with my stairs? Do not leave any items on the stairs. Make sure that there are handrails on both sides of the stairs and use them. Fix handrails that are broken or loose. Make sure that handrails are as long as the stairways. Check any carpeting to make sure that it is firmly attached to the stairs. Fix any carpet that is loose or worn. Avoid having throw rugs at the top or bottom of the stairs. If you do have throw rugs, attach them to the floor with carpet tape. Make sure that you have a light switch at the top of the stairs and the bottom of the stairs. If you do not have them, ask someone to add them for you. What else can I do to help prevent falls? Wear shoes that: Do not have high heels. Have rubber bottoms. Are comfortable  and fit you well. Are closed at the toe. Do not wear sandals. If you use a stepladder: Make sure that it is fully opened. Do not climb a closed stepladder. Make sure that both sides of the stepladder are locked into place. Ask someone to hold it for you, if possible. Clearly mark and make sure that you can see: Any grab bars or handrails. First and last steps. Where the edge of each step is. Use tools that help you move around (mobility aids) if they are needed. These include: Canes. Walkers. Scooters. Crutches. Turn on the lights when you go into a dark area. Replace any light bulbs as soon as they burn out. Set up your furniture so you have a clear path. Avoid moving your furniture around. If any of your floors are uneven, fix them. If there are any pets around you, be aware of where they are. Review your medicines with your doctor. Some medicines can make you feel dizzy. This can increase your chance of falling. Ask your doctor what other things that you can do to help prevent falls. This information is not intended to replace advice given to you by your health care provider. Make sure you discuss any questions you have with your health care provider. Document Released: 04/17/2009 Document Revised: 11/27/2015 Document Reviewed: 07/26/2014 Elsevier Interactive Patient Education  2017 [employer].

## 2021-06-02 NOTE — Progress Notes (Signed)
Subjective:   Kelsey Mason is a 80 y.o. female who presents for Medicare Annual (Subsequent) preventive examination.  Review of Systems    No ROS.  Medicare Wellness Virtual Visit.  Visual/audio telehealth visit, UTA vital signs.   See social history for additional risk factors.   Cardiac Risk Factors include: advanced age (>8mn, >>24women)     Objective:    Today's Vitals   06/02/21 1547  Weight: 154 lb (69.9 kg)  Height: 5' 2.5" (1.588 m)   Body mass index is 27.72 kg/m.  Advanced Directives 06/02/2021 11/14/2020 08/13/2020 07/30/2020 04/17/2020 12/24/2019 06/19/2019  Does Patient Have a Medical Advance Directive? Yes Yes Yes Yes Yes Yes Yes  Type of AParamedicof AWest BrowLiving will Living will;Healthcare Power of [place]Living will [place]Living will HHoodLiving will HTomeLiving will Living will;Healthcare Power of Attorney  Does patient want to make changes to medical advance directive? No - Patient declined - No - Patient declined No - Patient declined No - Patient declined - -  Copy of [place] Chart? No - copy requested - Yes - validated most recent copy scanned in chart (See row information) Yes - validated most recent copy scanned in chart (See row information) Yes - validated most recent copy scanned in chart (See row information) Yes - validated most recent copy scanned in chart (See row information) Yes - validated most recent copy scanned in chart (See row information)  Would patient like information on creating a medical advance directive? - - - - - - -    Current Medications (verified) Outpatient Encounter Medications as of 06/02/2021  Medication Sig   alendronate (FOSAMAX) 70 MG tablet Take 1 tablet (70 mg total) by mouth once a week. Take with a full glass of water on an empty stomach in the morning. Stay  upright for at least 30 minutes after taking and until first food of the day.   aspirin 81 MG tablet Take 81 mg by mouth daily.   blood glucose meter kit and supplies KIT Dispense based on patient and insurance preference. Use up to four times daily as directed. One Touch Ultra mini meter (FOR ICD-9 250.00, 250.01). Dx: E11.9   carvedilol (COREG) 12.5 MG tablet TAKE 1 TABLET BY MOUTH TWICE A DAY   cholecalciferol (VITAMIN D) 25 MCG (1000 UT) tablet Take 1,000 Units by mouth daily.   dapagliflozin propanediol (FARXIGA) 10 MG TABS tablet Take 10 mg by mouth daily.   furosemide (LASIX) 20 MG tablet TAKE 1 TABLET (20 MG TOTAL) BY MOUTH DAILY AS NEEDED (FOR >2LB WEIGHT GAIN OVERNIGHT OR 5 LB WEIGHT GAIN IN 1 WEEK OR SWELLING.).   glucose blood (ONE TOUCH ULTRA TEST) test strip USE TO TEST BLOOD SUGAR UP TO 4 TIMES DAILY   insulin glargine (LANTUS SOLOSTAR) 100 UNIT/ML Solostar Pen Inject 40 Units into the skin daily.   loratadine-pseudoephedrine (CLARITIN-D 24-HOUR) 10-240 MG 24 hr tablet Take 1 tablet by mouth daily.   losartan (COZAAR) 100 MG tablet TAKE 1/2 TABLET (50 MG TOTAL) BY MOUTH 2 (TWO) TIMES DAILY.   nitroGLYCERIN (NITROSTAT) 0.4 MG SL tablet PLACE 1 TABLET (0.4 MG TOTAL) UNDER THE TONGUE EVERY 5 (FIVE) MINUTES AS NEEDED FOR CHEST PAIN. MAXIMUM OF 3 DOSES.   NOVOFINE PEN NEEDLE 32G X 6 MM MISC USE AS DIRECTED WITH LANTUS PEN   rosuvastatin (CRESTOR) 20 MG tablet Take 1 tablet (  20 mg total) by mouth daily.   TRULICITY 1.5 DV/7.6HY SOPN Inject 1.5 mg as directed once a week.   vitamin B-12 (CYANOCOBALAMIN) 1000 MCG tablet Take 1,000 mcg by mouth daily.   No facility-administered encounter medications on file as of 06/02/2021.    Allergies (verified) Advil [ibuprofen], Aleve [naproxen], Fluticasone-salmeterol, Lipitor [atorvastatin], Naproxen sodium, and Penicillins   History: Past Medical History:  Diagnosis Date   Arthritis    fingers   Breast symptom 2014   Cancer [facility]) 2014    Bilateral Breast, chemo Dr. Jeb Levering   Degenerative disc disease, lumbar    Diabetes mellitus    GERD (gastroesophageal reflux disease)    mild   Hypertension    Personal history of chemotherapy    Personal history of radiation therapy 2014   bilat lumpectomy   Past Surgical History:  Procedure Laterality Date   ABDOMINAL AORTOGRAM W/LOWER EXTREMITY N/A 04/21/2018   Procedure: ABDOMINAL AORTOGRAM W/LOWER EXTREMITY;  Surgeon: Nelva Bush, MD;  Location: [facility] [facility];  Service: Cardiovascular;  Laterality: N/A;   ABDOMINAL HYSTERECTOMY  1976   for pelvic pain   BREAST EXCISIONAL BIOPSY Bilateral 11/2012   bilat breast ca chemo and rad   BREAST SURGERY Bilateral 2014   bilateral lumpectomy and SN biopsy   CATARACT EXTRACTION W/PHACO Left 07/30/2020   Procedure: CATARACT EXTRACTION PHACO AND INTRAOCULAR LENS PLACEMENT ([facility]) LEFT DIABETIC 18.73 01:45.0 17.8%;  Surgeon: Leandrew Koyanagi, MD;  Location: [facility];  Service: Ophthalmology;  Laterality: Left;  Diabetic - insulin and opral meds   CATARACT EXTRACTION W/PHACO Right 08/13/2020   Procedure: CATARACT EXTRACTION PHACO AND INTRAOCULAR LENS PLACEMENT ([place]) RIGHT DIABETIC;  Surgeon: Leandrew Koyanagi, MD;  Location: [facility];  Service: Ophthalmology;  Laterality: Right;  10.67 1:27.8 12.1%   CHOLECYSTECTOMY  2007   COLONOSCOPY WITH PROPOFOL N/A 10/13/2015   Procedure: COLONOSCOPY WITH PROPOFOL;  Surgeon: Manya Silvas, MD;  [facility] END[facility];  Service: Endoscopy;  Laterality: N/A;   LEFT HEART CATH AND CORONARY ANGIOGRAPHY N/A 04/21/2018   Procedure: LEFT HEART CATH AND CORONARY ANGIOGRAPHY;  Surgeon: Nelva Bush, MD;  Location: [facility];  Service: Cardiovascular;  Laterality: N/A;   OOPHORECTOMY Right    PORTACATH PLACEMENT  2014   TUMOR REMOVAL Right 1980   right foot   Family History  Problem Relation Age of Onset   Alcohol abuse Mother    Heart disease Mother     Hypertension Mother    Breast cancer Mother 86   Alcohol abuse Father    Heart disease Father    Hypertension Father    Heart disease Brother    Heart attack Brother    Social History   Socioeconomic History   Marital status: Widowed    Spouse name: Not on file   Number of children: Not on file   Years of education: Not on file   Highest education level: Not on file  Occupational History   Not on file  Tobacco Use   Smoking status: Former    Packs/day: 0.25    Years: 1.00    Pack years: 0.25    Types: Cigarettes    Quit date: 1963    Years since quitting: 59.9   Smokeless tobacco: Never  Vaping Use   Vaping Use: Never used  Substance and Sexual Activity   Alcohol use: No    Alcohol/week: 0.0 standard drinks   Drug use: No   Sexual activity: Never  Other Topics Concern  Not on file  Social History Narrative   Lives in [place].       Works - [employer]   Diet - regular   Exercise - dance 3x per week   Social Determinants of Radio broadcast assistant Strain: Low Risk    Difficulty of Paying Living Expenses: Not hard at all  Food Insecurity: No Food Insecurity   Worried About Charity fundraiser in the Last Year: Never true   Arboriculturist in the Last Year: Never true  Transportation Needs: No Transportation Needs   Lack of Transportation (Medical): No   Lack of Transportation (Non-Medical): No  Physical Activity: Sufficiently Active   Days of Exercise per Week: 2 days   Minutes of Exercise per Session: 150+ min  Stress: No Stress Concern Present   Feeling of Stress : Not at all  Social Connections: Unknown   Frequency of Communication with Friends and Family: More than three times a week   Frequency of Social Gatherings with Friends and Family: Twice a week   Attends Religious Services: Not on Electrical engineer or Organizations: Not on file   Attends Archivist Meetings: Not on file   Marital Status: Not on file     Tobacco Counseling Counseling given: Not Answered   Clinical Intake:  Pre-visit preparation completed: Yes        Diabetes: Yes (Followed by Endocrinology, Dr. Gabriel Carina)  How often do you need to have someone help you when you read instructions, pamphlets, or other written materials from your doctor or pharmacy?: 1 - Never    Interpreter Needed?: No      Activities of Daily Living In your present state of health, do you have any difficulty performing the following activities: 06/02/2021 08/13/2020  Hearing? N N  Vision? N N  Difficulty concentrating or making decisions? N N  Walking or climbing stairs? N N  Dressing or bathing? N N  Doing errands, shopping? N -  Preparing Food and eating ? N -  Using the Toilet? N -  In the past six months, have you accidently leaked urine? N -  Do you have problems with loss of bowel control? N -  Managing your Medications? N -  Managing your Finances? N -  Housekeeping or managing your Housekeeping? N -  Some recent data might be hidden    Patient Care Team: Leone Haven, MD as PCP - General (Family Medicine) End, Harrell Gave, MD as PCP - Cardiology (Cardiology) Christene Lye, MD as Consulting Physician (General Surgery) Gabriel Carina Betsey Holiday, MD as Physician Assistant (Endocrinology)  Indicate any recent Medical Services you may have received from other than [facility] providers in the past year (date may be approximate).     Assessment:   This is a routine wellness examination for Jestine.  Virtual Visit via Telephone Note  I connected with  Kelsey Mason on 06/02/21 at  3:30 PM EST by telephone and verified that I am speaking with the correct person using two identifiers.  Location: Patient: home Provider: office Persons participating in the virtual visit: patient/Nurse Health Advisor   I discussed the limitations, risks, security and privacy concerns of performing an evaluation and management service by  telephone and the availability of in person appointments. The patient expressed understanding and agreed to proceed.  Interactive audio and video telecommunications were attempted between this nurse and patient, however failed, due to patient having technical difficulties  OR patient did not have access to video capability.  We continued and completed visit with audio only.  Some vital signs may be absent or patient reported.   Hearing/Vision screen Hearing Screening - Comments:: Patient is able to hear conversational tones without difficulty. No issues reported. Vision Screening - Comments:: Followed by [facility]  Cataract extraction, bilateral Annual visits Wears reading glasses   Dietary issues and exercise activities discussed:   Regular diet Good water intake    Goals Addressed               This Visit's Progress     Patient Stated     I would like to lose 20-30lbs (pt-stated)        Portion control Reduce sugar intake Dancing weekly       Depression Screen PHQ 2/9 Scores 06/02/2021 08/05/2020 04/17/2020 11/12/2019 04/17/2019 03/02/2019 04/11/2018  PHQ - 2 Score 0 0 0 0 0 0 0  PHQ- 9 Score - - - - - - -    Fall Risk Fall Risk  06/02/2021 04/17/2020 11/12/2019 04/17/2019 03/02/2019  Falls in the past year? 0 0 0 0 0  Number falls in past yr: 0 0 0 - 0  Injury with Fall? - - - - 0  Follow up Falls evaluation completed Falls evaluation completed Falls evaluation completed - Falls evaluation completed    [place]: Home free of loose throw rugs in walkways, pet beds, electrical cords, etc? Yes  Adequate lighting in your home to reduce risk of falls? Yes   ASSISTIVE DEVICES UTILIZED TO PREVENT FALLS: Use of a cane, walker or w/c? No   TIMED UP AND GO: Was the test performed? No .   Cognitive Function: Patient is alert and oriented x3.  MMSE - Mini Mental State Exam 04/11/2018 04/08/2017 01/30/2016  Orientation to time '3 3  5  ' Orientation to time comments Asked to draw a clock face of time 11:10. Time shown 11:50. difficulty reading the face of a clock correctly -  Orientation to Place '5 5 5  ' Registration '3 3 3  ' Attention/ Calculation '3 3 5  ' Attention/Calculation-comments Asked to subtract by 3's Difficulty performing simple calculations -  Recall '3 3 3  ' Language- name 2 objects '2 2 2  ' Language- repeat '1 1 1  ' Language- follow 3 step command '3 3 3  ' Language- read & follow direction '1 1 1  ' Write a sentence '1 1 1  ' Copy design '1 1 1  ' Total score '26 26 30     ' 6CIT Screen 04/17/2019  What Year? 0 points  What month? 0 points  What time? 0 points  Count back from 20 0 points  Months in reverse 0 points  Repeat phrase 0 points  Total Score 0   Immunizations Immunization History  Administered Date(s) Administered   Fluad Quad(high Dose 65+) 04/26/2019, 02/02/2021   Influenza Whole 03/26/2012, 04/10/2013   Influenza, High Dose Seasonal PF 06/03/2016, 03/24/2017, 04/11/2018, 03/06/2020   Influenza,inj,Quad PF,6+ Mos 05/15/2014, 03/25/2015   Influenza-Unspecified 05/15/2014, 03/25/2015, 04/26/2019   PFIZER Comirnaty(Gray Top)Covid-19 Tri-Sucrose Vaccine 08/28/2019, 09/18/2019, 04/01/2020, 12/15/2020, 03/30/2021   PFIZER(Purple Top)SARS-COV-2 Vaccination 08/28/2019, 09/18/2019   Pneumococcal Conjugate-13 02/05/2019   Screening Tests Health Maintenance  Topic Date Due   COVID-19 Vaccine (6 - Booster for [place] series) 05/25/2021   OPHTHALMOLOGY EXAM  05/23/2021   TETANUS/TDAP  06/02/2022 (Originally 03/13/1960)   FOOT EXAM  08/05/2021   HEMOGLOBIN A1C  09/14/2021  INFLUENZA VACCINE  Completed   DEXA SCAN  Completed   HPV VACCINES  Aged Out   Pneumonia Vaccine 58+ Years old  Discontinued   Zoster Vaccines- Shingrix  Discontinued   Health Maintenance Health Maintenance Due  Topic Date Due   COVID-19 Vaccine (6 - Booster for [place] series) 05/25/2021   OPHTHALMOLOGY EXAM  05/23/2021   Hepatitis C  Screening: does not qualify  Vision Screening: Recommended annual ophthalmology exams for early detection of glaucoma and other disorders of the eye.  Dental Screening: Recommended annual dental exams for proper oral hygiene.  Community Resource Referral / Chronic Care Management: CRR required this visit?  No   CCM required this visit?  No      Plan:   Keep all routine maintenance appointments.   I have personally reviewed and noted the following in the patient's chart:   Medical and social history Use of alcohol, tobacco or illicit drugs  Current medications and supplements including opioid prescriptions. Not taking opioid.  Functional ability and status Nutritional status Physical activity Advanced directives List of other physicians Hospitalizations, surgeries, and ER visits in previous 12 months Vitals Screenings to include cognitive, depression, and falls Referrals and appointments  In addition, I have reviewed and discussed with patient certain preventive protocols, quality metrics, and best practice recommendations. A written personalized care plan for preventive services as well as general preventive health recommendations were provided to patient.     Varney Biles, LPN   00/06/3934

## 2021-06-09 DIAGNOSIS — E113211 Type 2 diabetes mellitus with mild nonproliferative diabetic retinopathy with macular edema, right eye: Secondary | ICD-10-CM | POA: Diagnosis not present

## 2021-06-09 LAB — HM DIABETES EYE EXAM

## 2021-06-15 ENCOUNTER — Inpatient Hospital Stay: Payer: Medicare PPO | Admitting: Nurse Practitioner

## 2021-06-15 ENCOUNTER — Telehealth: Payer: Medicare PPO | Admitting: Nurse Practitioner

## 2021-06-17 ENCOUNTER — Telehealth: Payer: Self-pay | Admitting: Internal Medicine

## 2021-06-17 ENCOUNTER — Inpatient Hospital Stay: Payer: Medicare PPO | Attending: Nurse Practitioner | Admitting: Nurse Practitioner

## 2021-06-17 ENCOUNTER — Other Ambulatory Visit: Payer: Self-pay

## 2021-06-17 ENCOUNTER — Encounter: Payer: Self-pay | Admitting: Nurse Practitioner

## 2021-06-17 VITALS — BP 162/67 | HR 78 | Temp 97.8°F | Resp 20 | Wt 156.2 lb

## 2021-06-17 DIAGNOSIS — M810 Age-related osteoporosis without current pathological fracture: Secondary | ICD-10-CM | POA: Diagnosis not present

## 2021-06-17 DIAGNOSIS — E1122 Type 2 diabetes mellitus with diabetic chronic kidney disease: Secondary | ICD-10-CM | POA: Diagnosis not present

## 2021-06-17 DIAGNOSIS — Z87891 Personal history of nicotine dependence: Secondary | ICD-10-CM | POA: Insufficient documentation

## 2021-06-17 DIAGNOSIS — Z853 Personal history of malignant neoplasm of breast: Secondary | ICD-10-CM | POA: Diagnosis not present

## 2021-06-17 DIAGNOSIS — Z90710 Acquired absence of both cervix and uterus: Secondary | ICD-10-CM | POA: Insufficient documentation

## 2021-06-17 DIAGNOSIS — I129 Hypertensive chronic kidney disease with stage 1 through stage 4 chronic kidney disease, or unspecified chronic kidney disease: Secondary | ICD-10-CM | POA: Diagnosis not present

## 2021-06-17 DIAGNOSIS — M816 Localized osteoporosis [Lequesne]: Secondary | ICD-10-CM | POA: Diagnosis not present

## 2021-06-17 DIAGNOSIS — Z803 Family history of malignant neoplasm of breast: Secondary | ICD-10-CM | POA: Insufficient documentation

## 2021-06-17 DIAGNOSIS — N189 Chronic kidney disease, unspecified: Secondary | ICD-10-CM | POA: Insufficient documentation

## 2021-06-17 MED ORDER — LOSARTAN POTASSIUM 100 MG PO TABS
ORAL_TABLET | ORAL | 1 refills | Status: DC
Start: 2021-06-17 — End: 2021-06-17

## 2021-06-17 MED ORDER — LOSARTAN POTASSIUM 100 MG PO TABS: ORAL_TABLET | ORAL | 1 refills | Status: DC

## 2021-06-17 NOTE — Progress Notes (Signed)
[facility] day:  06/17/2021  Chief Complaint: Kelsey Mason is a 80 y.o. female with bilateral stage IA breast cancer who is seen for 6 month assessment on Femara.  PERTINENT ONCOLOGY HISTORY Kelsey Mason is a 80 y.o.afemale who has above oncology history reviewed by me today presented for follow up visit for management of history of bilateral breast cancer. Patient previously follows up with Dr. Mike Gip. Switched care to me on 09/18/2018. Medical record review was performed by me.  bilateral breast cancer s/p lumpectomy and sentinel lymph node biopsy on 11/17/2012.  Right breast revealed a 5 mm grade I invasive carcinoma.  There was no DCIS.  Two sentinel lymph nodes were negative.  Pathologic stage was T1aN0M0.  Tumor was ER + (90%), PR + (20%), and Her2/neu -.   Left breast revealed a 1.8 cm grade III invasive ductal carcinoma.  There was lymphovascular invasion.  There was no DCIS.  One sentinel lymph node was negative.  Pathologic stage was T1cN0M0.  Tumor was ER + (>90%), PR + (1-5%), and Her2/neu -.  Oncotype DX testing on the left breast mass revealed a recurrence score of 28 which corresponded to a 10 year risk of distant recurrence of 19% (CI 15-23%) with tamoxifen alone.  BCI testing on 10/04/2017 revealed a high risk of late recurrence (5.4%; CI 2.5%-8.2%) during years 5-10.  There was a low likelihood of benefit from extended endocrine therapy.  She received Adriamycin and Cytoxan (AC) x 4 cycles followed by weekly Taxol x 10 (completed 06/05/2013).  She did not receive 2 cycles of Taxol secondary to progressive neuropathy.  She received bilateral breast radiation (07/2013 - 09/2013).  She began letrozole (Femara) in 09/2013.  CA27.29 has been followed:  24.4 on 09/10/2016, 28.1 on 03/14/2017, 24.9 on 09/12/2017, and 22.8 on 03/13/2018. She has a history of a borderline mild normocytic anemia.  Diet appears modest.   She denies any melena, hematochezia, hematuria or vaginal bleeding. Colonoscopy on 10/13/2015 was negative.    Soft tissue neck ultrasound on 03/16/2017 revealed no fluid collection or soft tissue lesion.  09/2013- 12/2019 Letrozole, which was discontinued due to worsening of osteoporosis and  Her BCI was obtained previously 10/04/2017 revealed a high risk of late recurrence (5.4%; CI 2.5%-8.2%) during years 5-10.  There was a low likelihood of benefit from extended endocrine therapy bone density done on 12/20/2019 Her T score was -2.3 in 2019 now left femoral neck T score at -2.5. She has progressed to osteoporosis state. I have previously discussed with patient about dental clearance and bisphosphonate use.  At that time, patient decided not to proceed with intervention due to COVID-19 pandemic      Kelsey Mason is a 80 y.o. female with above history of breast cancer who returns to clinic for routine surveillance of bilateral breast cancer.  She completed femara x 5 years in 2019. She declined prolia or zometa. Tried fosamax x 1 tablet but suffered constant diarrhea following. She is not interested in trying other medications.    Review of Systems  Constitutional:  Negative for appetite change, chills, fatigue and fever.  HENT:   Negative for hearing loss and voice change.   Eyes:  Negative for eye problems.  Respiratory:  Negative for chest tightness and cough.   Cardiovascular:  Negative for chest pain.  Gastrointestinal:  Negative for abdominal distention, abdominal pain, blood in stool and nausea.  Endocrine: Negative for hot  flashes.  Genitourinary:  Negative for difficulty urinating and frequency.   Musculoskeletal:  Positive for arthralgias.  Skin:  Negative for itching and rash.  Neurological:  Negative for extremity weakness and headaches.  Hematological:  Negative for adenopathy.  Psychiatric/Behavioral:  Negative for confusion.     Past Medical  History:  Diagnosis Date   Arthritis    fingers   Breast symptom 2014   Cancer [facility]) 2014   Bilateral Breast, chemo Dr. Jeb Levering   Degenerative disc disease, lumbar    Diabetes mellitus    GERD (gastroesophageal reflux disease)    mild   Hypertension    Personal history of chemotherapy    Personal history of radiation therapy 2014   bilat lumpectomy    Past Surgical History:  Procedure Laterality Date   ABDOMINAL AORTOGRAM W/LOWER EXTREMITY N/A 04/21/2018   Procedure: ABDOMINAL AORTOGRAM W/LOWER EXTREMITY;  Surgeon: Nelva Bush, MD;  Location: [place] [facility];  Service: Cardiovascular;  Laterality: N/A;   ABDOMINAL HYSTERECTOMY  1976   for pelvic pain   BREAST EXCISIONAL BIOPSY Bilateral 11/2012   bilat breast ca chemo and rad   BREAST SURGERY Bilateral 2014   bilateral lumpectomy and SN biopsy   CATARACT EXTRACTION W/PHACO Left 07/30/2020   Procedure: CATARACT EXTRACTION PHACO AND INTRAOCULAR LENS PLACEMENT ([facility]) LEFT DIABETIC 18.73 01:45.0 17.8%;  Surgeon: Leandrew Koyanagi, MD;  Location: [place];  Service: Ophthalmology;  Laterality: Left;  Diabetic - insulin and opral meds   CATARACT EXTRACTION W/PHACO Right 08/13/2020   Procedure: CATARACT EXTRACTION PHACO AND INTRAOCULAR LENS PLACEMENT ([place]) RIGHT DIABETIC;  Surgeon: Leandrew Koyanagi, MD;  Location: [place];  Service: Ophthalmology;  Laterality: Right;  10.67 1:27.8 12.1%   CHOLECYSTECTOMY  2007   COLONOSCOPY WITH PROPOFOL N/A 10/13/2015   Procedure: COLONOSCOPY WITH PROPOFOL;  Surgeon: Manya Silvas, MD;  Location: [facility];  Service: Endoscopy;  Laterality: N/A;   LEFT HEART CATH AND CORONARY ANGIOGRAPHY N/A 04/21/2018   Procedure: LEFT HEART CATH AND CORONARY ANGIOGRAPHY;  Surgeon: Nelva Bush, MD;  Location: [place] [facility];  Service: Cardiovascular;  Laterality: N/A;   OOPHORECTOMY Right    PORTACATH PLACEMENT  2014   TUMOR REMOVAL Right 1980   right foot     Family History  Problem Relation Age of Onset   Alcohol abuse Mother    Heart disease Mother    Hypertension Mother    Breast cancer Mother 25   Alcohol abuse Father    Heart disease Father    Hypertension Father    Heart disease Brother    Heart attack Brother    Social History   Socioeconomic History   Marital status: Widowed    Spouse name: Not on file   Number of children: Not on file   Years of education: Not on file   Highest education level: Not on file  Occupational History   Not on file  Tobacco Use   Smoking status: Former    Packs/day: 0.25    Years: 1.00    Pack years: 0.25    Types: Cigarettes    Quit date: 1963    Years since quitting: 59.9   Smokeless tobacco: Never  Vaping Use   Vaping Use: Never used  Substance and Sexual Activity   Alcohol use: No    Alcohol/week: 0.0 standard drinks   Drug use: No   Sexual activity: Never  Other Topics Concern   Not on file  Social History Narrative   Lives  in [place].       Works - [employer]   Diet - regular   Exercise - dance 3x per week   Social Determinants of Radio broadcast assistant Strain: Low Risk    Difficulty of Paying Living Expenses: Not hard at all  Food Insecurity: No Food Insecurity   Worried About Charity fundraiser in the Last Year: Never true   Arboriculturist in the Last Year: Never true  Transportation Needs: No Transportation Needs   Lack of Transportation (Medical): No   Lack of Transportation (Non-Medical): No  Physical Activity: Sufficiently Active   Days of Exercise per Week: 2 days   Minutes of Exercise per Session: 150+ min  Stress: No Stress Concern Present   Feeling of Stress : Not at all  Social Connections: Unknown   Frequency of Communication with Friends and Family: More than three times a week   Frequency of Social Gatherings with Friends and Family: Twice a week   Attends Religious Services: Not on Electrical engineer or Organizations:  Not on file   Attends Archivist Meetings: Not on file   Marital Status: Not on file  Intimate Partner Violence: Not At Risk   Fear of Current or Ex-Partner: No   Emotionally Abused: No   Physically Abused: No   Sexually Abused: No  She lives by herself in [place].  Husband passed away many years ago.  The patient is alone today.  Allergies:  Allergies  Allergen Reactions   Advil [Ibuprofen] Other (See Comments)    Dizziness    Aleve [Naproxen] Other (See Comments)    Dizziness   Fluticasone-Salmeterol Other (See Comments)    dizziness   Lipitor [Atorvastatin] Other (See Comments)    Legs weak.  Leg pain, muscle ache   Naproxen Sodium Other (See Comments)   Penicillins Hives and Other (See Comments)    Has patient had a PCN reaction causing immediate rash, facial/tongue/throat swelling, SOB or lightheadedness with hypotension: YES Has patient had a PCN reaction causing severe rash involving mucus membranes or skin necrosis: no Has patient had a PCN reaction that required hospitalization: no Has patient had a PCN reaction occurring within the last 10 years: no If all of the above answers are "NO", then may proceed with Cephalosporin use.     Current Medications: Current Outpatient Medications  Medication Sig Dispense Refill   aspirin 81 MG tablet Take 81 mg by mouth daily.     blood glucose meter kit and supplies KIT Dispense based on patient and insurance preference. Use up to four times daily as directed. One Touch Ultra mini meter (FOR ICD-9 250.00, 250.01). Dx: E11.9 1 each 0   carvedilol (COREG) 12.5 MG tablet TAKE 1 TABLET BY MOUTH TWICE A DAY 180 tablet 0   cholecalciferol (VITAMIN D) 25 MCG (1000 UT) tablet Take 1,000 Units by mouth daily.     dapagliflozin propanediol (FARXIGA) 10 MG TABS tablet Take 10 mg by mouth daily.     furosemide (LASIX) 20 MG tablet TAKE 1 TABLET (20 MG TOTAL) BY MOUTH DAILY AS NEEDED (FOR >2LB WEIGHT GAIN OVERNIGHT OR 5 LB WEIGHT  GAIN IN 1 WEEK OR SWELLING.). 90 tablet 3   glucose blood (ONE TOUCH ULTRA TEST) test strip USE TO TEST BLOOD SUGAR UP TO 4 TIMES DAILY 100 each 6   insulin glargine (LANTUS SOLOSTAR) 100 UNIT/ML Solostar Pen Inject 40 Units into the skin  daily.     loratadine-pseudoephedrine (CLARITIN-D 24-HOUR) 10-240 MG 24 hr tablet Take 1 tablet by mouth daily.     losartan (COZAAR) 100 MG tablet TAKE 1/2 TABLET (50 MG TOTAL) BY MOUTH 2 (TWO) TIMES DAILY. 90 tablet 1   nitroGLYCERIN (NITROSTAT) 0.4 MG SL tablet PLACE 1 TABLET (0.4 MG TOTAL) UNDER THE TONGUE EVERY 5 (FIVE) MINUTES AS NEEDED FOR CHEST PAIN. MAXIMUM OF 3 DOSES. 25 tablet 3   NOVOFINE PEN NEEDLE 32G X 6 MM MISC USE AS DIRECTED WITH LANTUS PEN 100 each 2   rosuvastatin (CRESTOR) 20 MG tablet Take 1 tablet (20 mg total) by mouth daily. 90 tablet 3   TRULICITY 1.5 XN/2.3FT SOPN Inject 1.5 mg as directed once a week.  11   vitamin B-12 (CYANOCOBALAMIN) 1000 MCG tablet Take 1,000 mcg by mouth daily.     alendronate (FOSAMAX) 70 MG tablet Take 1 tablet (70 mg total) by mouth once a week. Take with a full glass of water on an empty stomach in the morning. Stay upright for at least 30 minutes after taking and until first food of the day. (Patient not taking: Reported on 06/17/2021) 4 tablet 1   No current facility-administered medications for this visit.   Physical Exam: Blood pressure (!) 162/67, pulse 78, temperature 97.8 F (36.6 C), resp. rate 20, weight 156 lb 3.2 oz (70.9 kg), SpO2 100 %. Physical Exam Constitutional:      General: She is not in acute distress.    Appearance: She is not diaphoretic.  HENT:     Mouth/Throat:     Pharynx: No oropharyngeal exudate.  Eyes:     General: No scleral icterus. Cardiovascular:     Rate and Rhythm: Normal rate and regular rhythm.  Pulmonary:     Effort: Pulmonary effort is normal. No respiratory distress.  Abdominal:     General: There is no distension.     Palpations: Abdomen is soft.      Tenderness: There is no abdominal tenderness.  Musculoskeletal:        General: No deformity. Normal range of motion.  Skin:    General: Skin is warm and dry.  Neurological:     Mental Status: She is alert and oriented to person, place, and time.     Motor: No abnormal muscle tone.  Psychiatric:        Mood and Affect: Mood and affect normal.        Behavior: Behavior normal.    No visits with results within 3 Day(s) from this visit.  Latest known visit with results is:  Appointment on 05/15/2021  Component Date Value Ref Range Status   CA 27.29 05/15/2021 20.4  0.0 - 38.6 U/mL Final   Comment: (NOTE) Siemens Centaur Immunochemiluminometric Methodology (ICMA) Values obtained with different assay methods or kits cannot be used interchangeably. Results cannot be interpreted as absolute evidence of the presence or absence of malignant disease. Performed At: [facility] [place], [place] [id number] Rush Farmer MD HC:[id number]    Sodium 05/15/2021 133 (L)  135 - 145 mmol/L Final   Potassium 05/15/2021 4.4  3.5 - 5.1 mmol/L Final   Chloride 05/15/2021 100  98 - 111 mmol/L Final   CO2 05/15/2021 26  22 - 32 mmol/L Final   Glucose, Bld 05/15/2021 103 (H)  70 - 99 mg/dL Final   Glucose reference range applies only to samples taken after fasting for at least 8 hours.   BUN 05/15/2021 22  8 - 23 mg/dL Final   Creatinine, Ser 05/15/2021 1.31 (H)  0.44 - 1.00 mg/dL Final   Calcium 05/15/2021 9.2  8.9 - 10.3 mg/dL Final   Total Protein 05/15/2021 6.7  6.5 - 8.1 g/dL Final   Albumin 05/15/2021 4.5  3.5 - 5.0 g/dL Final   AST 05/15/2021 18  15 - 41 U/L Final   ALT 05/15/2021 19  0 - 44 U/L Final   Alkaline Phosphatase 05/15/2021 68  38 - 126 U/L Final   Total Bilirubin 05/15/2021 0.8  0.3 - 1.2 mg/dL Final   GFR, Estimated 05/15/2021 41 (L)  >60 mL/min Final   Comment: (NOTE) Calculated using the CKD-EPI Creatinine Equation (2021)    Anion gap 05/15/2021 7  5  - 15 Final   Performed at [facility], [place], [place] [address]   WBC 05/15/2021 8.7  4.0 - 10.5 K/uL Final   RBC 05/15/2021 4.07  3.87 - 5.11 MIL/uL Final   Hemoglobin 05/15/2021 12.1  12.0 - 15.0 g/dL Final   HCT 05/15/2021 36.2  36.0 - 46.0 % Final   MCV 05/15/2021 88.9  80.0 - 100.0 fL Final   MCH 05/15/2021 29.7  26.0 - 34.0 pg Final   MCHC 05/15/2021 33.4  30.0 - 36.0 g/dL Final   RDW 05/15/2021 12.1  11.5 - 15.5 % Final   Platelets 05/15/2021 164  150 - 400 K/uL Final   nRBC 05/15/2021 0.0  0.0 - 0.2 % Final   Neutrophils Relative % 05/15/2021 70  % Final   Neutro Abs 05/15/2021 6.2  1.7 - 7.7 K/uL Final   Lymphocytes Relative 05/15/2021 18  % Final   Lymphs Abs 05/15/2021 1.6  0.7 - 4.0 K/uL Final   Monocytes Relative 05/15/2021 6  % Final   Monocytes Absolute 05/15/2021 0.5  0.1 - 1.0 K/uL Final   Eosinophils Relative 05/15/2021 5  % Final   Eosinophils Absolute 05/15/2021 0.4  0.0 - 0.5 K/uL Final   Basophils Relative 05/15/2021 1  % Final   Basophils Absolute 05/15/2021 0.1  0.0 - 0.1 K/uL Final   Immature Granulocytes 05/15/2021 0  % Final   Abs Immature Granulocytes 05/15/2021 0.03  0.00 - 0.07 K/uL Final   Performed at [facility], [address] [place], [place] [address]   RADIOGRAPHIC STUDIES: I have personally reviewed the radiological images as listed and agreed with the findings in the report. MM 3D SCREEN BREAST BILATERAL  Result Date: 05/13/2021 CLINICAL DATA:  Screening. EXAM: DIGITAL SCREENING BILATERAL MAMMOGRAM WITH TOMOSYNTHESIS AND CAD TECHNIQUE: Bilateral screening digital craniocaudal and mediolateral oblique mammograms were obtained. Bilateral screening digital breast tomosynthesis was performed. The images were evaluated with computer-aided detection. COMPARISON:  Previous exam(s). ACR Breast Density Category b: There are scattered areas of fibroglandular density. FINDINGS: There are no findings suspicious for  malignancy. IMPRESSION: No mammographic evidence of malignancy. A result letter of this screening mammogram will be mailed directly to the patient. RECOMMENDATION: Screening mammogram in one year. (Code:[id number]-B-01Y) BI-RADS CATEGORY  1: Negative. Electronically Signed   By: Franki Cabot M.D.   On: 05/13/2021 16:28   Assessment:  Kelsey Mason is a 80 y.o. female presents for follow-up of bilateral stage I breast cancer.  No diagnosis found.  1.  History of breast cancer-diagnosed in 2014. BCI was obtained in 2019 revealed high risk of recurrence.  Low likelihood of benefit from extended endocrine therapy and therefore she completed 5 years of femara in  2019. 05/13/21 Mammogram was independently reviewed today and was negative for radiographic evidence of malignancy.  Clinically, she remains asymptomatic. Continue annual surveillance  2.  Osteoporosis-left femoral neck.  T score is now -2.5.  Declined prolia or zometa. Unable to tolerate fosamax. She can follow up with pcp but declines any additional treatment at this time.   3.  CKD-creatinine's are stable. eGFR 41. Encouraged hydration.   RTC in 6 months for labs (cbc, cmp, ca27.29), see Dr Tasia Catchings- la  Beckey Rutter, [place], AGNP-C [place] at [facility] [phone] (clinic) 06/17/2021

## 2021-06-17 NOTE — Telephone Encounter (Signed)
°*  STAT* If patient is at the pharmacy, call can be transferred to refill team.   1. Which medications need to be refilled? (please list name of each medication and dose if known) Losartan 100mg  half tablet twice a day  2. Which pharmacy/location (including street and city if local pharmacy) is medication to be sent to? [facility], [place]  3. Do they need a 30 day or 90 day supply? 90 day

## 2021-06-17 NOTE — Progress Notes (Signed)
Patient states she started fosamax and stopped it the very next day because the first day taking this medication it made her go to the bathroom every 4 hours with very bad diarrhea.

## 2021-06-17 NOTE — Telephone Encounter (Signed)
losartan (COZAAR) 100 MG tablet 90 tablet 1 06/17/2021    Sig: TAKE 1/2 TABLET (50 MG TOTAL) BY MOUTH 2 (TWO) TIMES DAILY.   Sent to pharmacy as: losartan (COZAAR) 100 MG tablet   E-Prescribing Status: Receipt confirmed by pharmacy (06/17/2021  3:30 PM EST)    Pharmacy  [id number] [address], [place]

## 2021-07-28 ENCOUNTER — Other Ambulatory Visit: Payer: Self-pay

## 2021-07-28 ENCOUNTER — Telehealth: Payer: Self-pay | Admitting: Internal Medicine

## 2021-07-28 MED ORDER — CARVEDILOL 12.5 MG PO TABS: 12.5000 mg | ORAL_TABLET | Freq: Two times a day (BID) | ORAL | 0 refills | Status: DC

## 2021-07-28 NOTE — Telephone Encounter (Signed)
°*  STAT* If patient is at the pharmacy, call can be transferred to refill team.   1. Which medications need to be refilled? (please list name of each medication and dose if known) carvedilol   2. Which pharmacy/location (including street and city if local pharmacy) is medication to be sent to? [facility] [place]    3. Do they need a 30 day or 90 day supply? Kelsey Mason

## 2021-07-28 NOTE — Telephone Encounter (Signed)
carvedilol (COREG) 12.5 MG tablet 180 tablet 0 07/28/2021    Sig - Route: Take 1 tablet (12.5 mg total) by mouth 2 (two) times daily. - Oral   Sent to pharmacy as: carvedilol (COREG) 12.5 MG tablet   E-Prescribing Status: Receipt confirmed by pharmacy (07/28/2021  9:24 AM EST)    Pharmacy  [id number] [address], [place]

## 2021-07-29 DIAGNOSIS — G3184 Mild cognitive impairment, so stated: Secondary | ICD-10-CM | POA: Diagnosis not present

## 2021-07-29 DIAGNOSIS — E785 Hyperlipidemia, unspecified: Secondary | ICD-10-CM | POA: Diagnosis not present

## 2021-07-29 DIAGNOSIS — E1169 Type 2 diabetes mellitus with other specified complication: Secondary | ICD-10-CM | POA: Diagnosis not present

## 2021-07-29 DIAGNOSIS — G250 Essential tremor: Secondary | ICD-10-CM | POA: Diagnosis not present

## 2021-08-03 ENCOUNTER — Encounter: Payer: Self-pay | Admitting: Internal Medicine

## 2021-08-03 ENCOUNTER — Other Ambulatory Visit: Payer: Self-pay

## 2021-08-03 ENCOUNTER — Ambulatory Visit
Admission: RE | Admit: 2021-08-03 | Discharge: 2021-08-03 | Disposition: A | Discharge status: Home / Self Care | Payer: Medicare PPO | Source: Ambulatory Visit | Attending: Internal Medicine | Admitting: Internal Medicine

## 2021-08-03 ENCOUNTER — Ambulatory Visit (INDEPENDENT_AMBULATORY_CARE_PROVIDER_SITE_OTHER): Payer: Medicare PPO | Admitting: Internal Medicine

## 2021-08-03 VITALS — Ht 62.5 in | Wt 151.2 lb

## 2021-08-03 DIAGNOSIS — Z87898 Personal history of other specified conditions: Secondary | ICD-10-CM

## 2021-08-03 DIAGNOSIS — R053 Chronic cough: Secondary | ICD-10-CM | POA: Insufficient documentation

## 2021-08-03 DIAGNOSIS — J189 Pneumonia, unspecified organism: Secondary | ICD-10-CM

## 2021-08-03 DIAGNOSIS — R059 Cough, unspecified: Secondary | ICD-10-CM | POA: Diagnosis not present

## 2021-08-03 DIAGNOSIS — T17800A Unspecified foreign body in other parts of respiratory tract causing asphyxiation, initial encounter: Secondary | ICD-10-CM

## 2021-08-03 MED ORDER — BENZONATATE 200 MG PO CAPS: 200.0000 mg | ORAL_CAPSULE | Freq: Three times a day (TID) | ORAL | 0 refills | Status: DC | PRN

## 2021-08-03 MED ORDER — ALBUTEROL SULFATE HFA 108 (90 BASE) MCG/ACT IN AERS: 2.0000 | INHALATION_SPRAY | Freq: Four times a day (QID) | RESPIRATORY_TRACT | 11 refills | Status: DC | PRN

## 2021-08-03 MED ORDER — PREDNISONE 10 MG PO TABS: ORAL_TABLET | ORAL | 0 refills | Status: DC

## 2021-08-03 NOTE — Progress Notes (Addendum)
Telephone Note  This visit type was conducted due to national recommendations for restrictions regarding the COVID-19 pandemic (e.g. social distancing).  This format is felt to be most appropriate for this patient at this time.  All issues noted in this document were discussed and addressed.  No physical exam was performed (except for noted visual exam findings with Video Visits).   I connected withNAME@ on 08/03/21 at 11:00 AM EST by telephone and verified that I am speaking with the correct person using two identifiers. Location patient: home Location provider: work or home office Persons participating in the virtual visit: patient, provider  I discussed the limitations, risks, security and privacy concerns of performing an evaluation and management service by telephone and the availability of in person appointments. I also discussed with the patient that there may be a patient responsible charge related to this service. The patient expressed understanding and agreed to proceed.  Interactive audio and video telecommunications were attempted between this provider and patient, however failed, due to patient having technical difficulties OR patient did not have access to video capability.  We continued and completed visit with audio only.   Reason for visit: persistent cough,  low grade fevers   HPI:  81 yr old female  with history of breast cancer  and heart failure.  presents with persistent paroxysmal cough productive of clear sputum accompanied by temp to 100.6 using a forehead  thermometer .  symptoms started after a choking episode that occurred at home 5 days ago that was prolonged.  Has a history of sinus drainage with intermittent purulence,  a 'bad headache " above the eyebrows, but states that the headaches  is chronic. and coughs  a little daily.  Remote history of smoking less than one year over 50 years ago.  Denies dyspnea, pleurisy, but has been wheezing at night with supine  position  .  Denies fluid retention,  weight  has  not changed ,  151.2 at home , no weight gain by daily weights.   Lives alone  can drive.    ROS: See pertinent positives and negatives per HPI.  Past Medical History:  Diagnosis Date   Arthritis    fingers   Breast symptom 2014   Cancer [facility]) 2014   Bilateral Breast, chemo Dr. Jeb Levering   Degenerative disc disease, lumbar    Diabetes mellitus    GERD (gastroesophageal reflux disease)    mild   Hypertension    Personal history of chemotherapy    Personal history of radiation therapy 2014   bilat lumpectomy    Past Surgical History:  Procedure Laterality Date   ABDOMINAL AORTOGRAM W/LOWER EXTREMITY N/A 04/21/2018   Procedure: ABDOMINAL AORTOGRAM W/LOWER EXTREMITY;  Surgeon: Nelva Bush, MD;  Location: [place] [facility];  Service: Cardiovascular;  Laterality: N/A;   ABDOMINAL HYSTERECTOMY  1976   for pelvic pain   BREAST EXCISIONAL BIOPSY Bilateral 11/2012   bilat breast ca chemo and rad   BREAST SURGERY Bilateral 2014   bilateral lumpectomy and SN biopsy   CATARACT EXTRACTION W/PHACO Left 07/30/2020   Procedure: CATARACT EXTRACTION PHACO AND INTRAOCULAR LENS PLACEMENT ([facility]) LEFT DIABETIC 18.73 01:45.0 17.8%;  Surgeon: Leandrew Koyanagi, MD;  Location: [place];  Service: Ophthalmology;  Laterality: Left;  Diabetic - insulin and opral meds   CATARACT EXTRACTION W/PHACO Right 08/13/2020   Procedure: CATARACT EXTRACTION PHACO AND INTRAOCULAR LENS PLACEMENT ([place]) RIGHT DIABETIC;  Surgeon: Leandrew Koyanagi, MD;  Location: [place];  Service:  Ophthalmology;  Laterality: Right;  10.67 1:27.8 12.1%   CHOLECYSTECTOMY  2007   COLONOSCOPY WITH PROPOFOL N/A 10/13/2015   Procedure: COLONOSCOPY WITH PROPOFOL;  Surgeon: Manya Silvas, MD;  Location: [facility];  Service: Endoscopy;  Laterality: N/A;   LEFT HEART CATH AND CORONARY ANGIOGRAPHY N/A 04/21/2018   Procedure: LEFT HEART CATH AND CORONARY ANGIOGRAPHY;   Surgeon: Nelva Bush, MD;  Location: [place] [facility];  Service: Cardiovascular;  Laterality: N/A;   OOPHORECTOMY Right    PORTACATH PLACEMENT  2014   TUMOR REMOVAL Right 1980   right foot    Family History  Problem Relation Age of Onset   Alcohol abuse Mother    Heart disease Mother    Hypertension Mother    Breast cancer Mother 68   Alcohol abuse Father    Heart disease Father    Hypertension Father    Heart disease Brother    Heart attack Brother     SOCIAL HX:  reports that she quit smoking about 60 years ago. Her smoking use included cigarettes. She has a 0.25 pack-year smoking history. She has never used smokeless tobacco. She reports that she does not drink alcohol and does not use drugs.    Current Outpatient Medications:    albuterol (VENTOLIN HFA) 108 (90 Base) MCG/ACT inhaler, Inhale 2 puffs into the lungs every 6 (six) hours as needed for wheezing or shortness of breath., Disp: 3.7 g, Rfl: 11   aspirin 81 MG tablet, Take 81 mg by mouth daily., Disp: , Rfl:    benzonatate (TESSALON) 200 MG capsule, Take 1 capsule (200 mg total) by mouth 3 (three) times daily as needed for cough., Disp: 20 capsule, Rfl: 0   blood glucose meter kit and supplies KIT, Dispense based on patient and insurance preference. Use up to four times daily as directed. One Touch Ultra mini meter (FOR ICD-9 250.00, 250.01). Dx: E11.9, Disp: 1 each, Rfl: 0   carvedilol (COREG) 12.5 MG tablet, Take 1 tablet (12.5 mg total) by mouth 2 (two) times daily., Disp: 180 tablet, Rfl: 0   cholecalciferol (VITAMIN D) 25 MCG (1000 UT) tablet, Take 1,000 Units by mouth daily., Disp: , Rfl:    dapagliflozin propanediol (FARXIGA) 10 MG TABS tablet, Take 10 mg by mouth daily., Disp: , Rfl:    Dextromethorphan-guaiFENesin (ROBITUSSIN COLD COUGH+ CHEST PO), Take by mouth as needed., Disp: , Rfl:    furosemide (LASIX) 20 MG tablet, TAKE 1 TABLET (20 MG TOTAL) BY MOUTH DAILY AS NEEDED (FOR >2LB WEIGHT GAIN OVERNIGHT OR  5 LB WEIGHT GAIN IN 1 WEEK OR SWELLING.)., Disp: 90 tablet, Rfl: 3   glucose blood (ONE TOUCH ULTRA TEST) test strip, USE TO TEST BLOOD SUGAR UP TO 4 TIMES DAILY, Disp: 100 each, Rfl: 6   insulin glargine (LANTUS SOLOSTAR) 100 UNIT/ML Solostar Pen, Inject 40 Units into the skin daily., Disp: , Rfl:    loratadine-pseudoephedrine (CLARITIN-D 24-HOUR) 10-240 MG 24 hr tablet, Take 1 tablet by mouth daily., Disp: , Rfl:    losartan (COZAAR) 100 MG tablet, TAKE 1/2 TABLET (50 MG TOTAL) BY MOUTH 2 (TWO) TIMES DAILY., Disp: 90 tablet, Rfl: 1   nitroGLYCERIN (NITROSTAT) 0.4 MG SL tablet, PLACE 1 TABLET (0.4 MG TOTAL) UNDER THE TONGUE EVERY 5 (FIVE) MINUTES AS NEEDED FOR CHEST PAIN. MAXIMUM OF 3 DOSES., Disp: 25 tablet, Rfl: 3   NOVOFINE PEN NEEDLE 32G X 6 MM MISC, USE AS DIRECTED WITH LANTUS PEN, Disp: 100 each, Rfl: 2  predniSONE (DELTASONE) 10 MG tablet, 6 tablets on Day 1 , then reduce by 1 tablet daily until gone, Disp: 21 tablet, Rfl: 0   TRULICITY 1.5 AQ/7.6AU SOPN, Inject 1.5 mg as directed once a week., Disp: , Rfl: 11   vitamin B-12 (CYANOCOBALAMIN) 1000 MCG tablet, Take 1,000 mcg by mouth daily., Disp: , Rfl:    doxycycline (VIBRA-TABS) 100 MG tablet, Take 1 tablet (100 mg total) by mouth 2 (two) times daily., Disp: 14 tablet, Rfl: 0   levofloxacin (LEVAQUIN) 500 MG tablet, Take 1 tablet (500 mg total) by mouth daily., Disp: 7 tablet, Rfl: 0   rosuvastatin (CRESTOR) 20 MG tablet, Take 1 tablet (20 mg total) by mouth daily. (Patient not taking: Reported on 08/03/2021), Disp: 90 tablet, Rfl: 3  EXAM:   General impression: alert, cooperative and articulate.  No signs of being in distress , but sounds tired Lungs: speech is fluent sentence length suggests that patient is not short of breath,  but is  punctuated by paroxysmal coughing brought on by talking and deep breathing .   Psych: affect grumpy.  speech is articulate and non pressured .  Denies suicidal thoughts   ASSESSMENT AND  PLAN:  Discussed the following assessment and plan:  Persistent cough - Plan: DG Chest 2 View  History of airway aspiration - Plan: DG Chest 2 View  Aspiration into lower respiratory tract, initial encounter  Pneumonia of both lower lobes due to infectious organism  Aspiration into lower respiratory tract Suggested by history of choking episode 5 days ago while eating chicken tenders at home.  Had no precedent symptoms and no history of dysphagia.   Has had a persistent paroxysmal cough since Wednesday accompanied by wheezing without fevers,  Pleurisy or dyspnea.  Sending for chest x ray.  She prefers to avoid prednisone but has no history of use or of adverse events.  In the absence of fevers,  Will prescribe cough suppression, prednisone and albuterol MDI.  Will add abx if infiltrate is noted.    Pneumonia Chest x ray concerning for pneumonia.  Covering with dual therapy (levofloxacin and doxycycline) x 7 days,  Follow up on Friday     I discussed the assessment and treatment plan with the patient. The patient was provided an opportunity to ask questions and all were answered. The patient agreed with the plan and demonstrated an understanding of the instructions.   The patient was advised to call back or seek an in-person evaluation if the symptoms worsen or if the condition fails to improve as anticipated.   I provided 20 minutes of non-face-to-face time during this telephone encounter to include pre-visit review of his medical history,  telephone  time with the patient , and post visit ordering of testing and therapeutics.    Crecencio Mc, MD

## 2021-08-03 NOTE — Progress Notes (Signed)
Onset of symptoms last Wednesday afternoon. Patient states she ate chicken nuggets for lunch that day and choked on them. States she thought she may have to call EMS but was able to breathe again. Has had these symptoms since then.   Productive cough with clear mucus, runny nose with slight blood, an on and off fever with hight being 100.6 F.   No one around the Patient is sick. Has not been tested for anything.

## 2021-08-03 NOTE — Assessment & Plan Note (Addendum)
Suggested by history of choking episode 5 days ago while eating chicken tenders at home.  Had no precedent symptoms and no history of dysphagia.   Has had a persistent paroxysmal cough since Wednesday accompanied by wheezing without fevers,  Pleurisy or dyspnea.  Sending for chest x ray.  She prefers to avoid prednisone but has no history of use or of adverse events.  In the absence of fevers,  Will prescribe cough suppression, prednisone and albuterol MDI.  Will add abx if infiltrate is noted.

## 2021-08-04 ENCOUNTER — Telehealth: Payer: Self-pay | Admitting: Family Medicine

## 2021-08-04 ENCOUNTER — Other Ambulatory Visit: Payer: Self-pay | Admitting: Internal Medicine

## 2021-08-04 MED ORDER — LEVOFLOXACIN 500 MG PO TABS: 500.0000 mg | ORAL_TABLET | Freq: Every day | ORAL | 0 refills | Status: DC

## 2021-08-04 MED ORDER — DOXYCYCLINE HYCLATE 100 MG PO TABS: 100.0000 mg | ORAL_TABLET | Freq: Two times a day (BID) | ORAL | 0 refills | Status: DC

## 2021-08-04 NOTE — Telephone Encounter (Signed)
Pt called in requesting result from chest x-ray. Pt requesting callback

## 2021-08-04 NOTE — Assessment & Plan Note (Signed)
Chest x ray concerning for pneumonia.  Covering with dual therapy (levofloxacin and doxycycline) x 7 days,  Follow up on Friday

## 2021-08-04 NOTE — Telephone Encounter (Signed)
I called the patient to inform her that the chest xray has not been resulted yet but I will call her when it has.  She understood.  Jahid Weida,cma

## 2021-08-05 ENCOUNTER — Telehealth: Payer: Self-pay | Admitting: Family Medicine

## 2021-08-05 NOTE — Telephone Encounter (Signed)
I called and spoke with the patient and informed her that the provider stated for her to not take the prednisone or benzonatate and if she has any breathing issues to go to the Er, I also informed her of her x-ray results and informed hr that 2 antibiotics were sent to the pharmacy for her and she is scheduled for a follow up on Friday with the provider.  Quint Chestnut,cma

## 2021-08-05 NOTE — Telephone Encounter (Signed)
Patient called in took new medication  benzonatate 200 mg  and prednisone , making eye run and swelling , breathing is hard  transfer to the access nurse

## 2021-08-05 NOTE — Telephone Encounter (Signed)
Noted.  She needs to remain off of the prednisone and the benzonatate.  If she has increasing trouble breathing she needs to go to the emergency department.  Please see the result note from Dr. Derrel Nip that is attached to the chest x-ray.  Please relay that to the patient.

## 2021-08-05 NOTE — Telephone Encounter (Signed)
I called and spoke with the patient she stated that the ambulance did come to her home and stated she did not need t go to the Er, she stated she is somewhat better she just has a cough.  She asked about her chest xray that was done yesterday and I informed her that it has not resulted yet, she has stopped the medication and wants to know your suggestions.  Woodrow Drab,cma

## 2021-08-07 ENCOUNTER — Other Ambulatory Visit: Payer: Self-pay

## 2021-08-07 ENCOUNTER — Encounter: Payer: Self-pay | Admitting: Family Medicine

## 2021-08-07 ENCOUNTER — Ambulatory Visit: Payer: Medicare PPO | Admitting: Family Medicine

## 2021-08-07 DIAGNOSIS — I10 Essential (primary) hypertension: Secondary | ICD-10-CM

## 2021-08-07 DIAGNOSIS — E1122 Type 2 diabetes mellitus with diabetic chronic kidney disease: Secondary | ICD-10-CM | POA: Diagnosis not present

## 2021-08-07 DIAGNOSIS — E785 Hyperlipidemia, unspecified: Secondary | ICD-10-CM | POA: Diagnosis not present

## 2021-08-07 DIAGNOSIS — Z794 Long term (current) use of insulin: Secondary | ICD-10-CM | POA: Diagnosis not present

## 2021-08-07 DIAGNOSIS — J189 Pneumonia, unspecified organism: Secondary | ICD-10-CM | POA: Diagnosis not present

## 2021-08-07 DIAGNOSIS — N1832 Chronic kidney disease, stage 3b: Secondary | ICD-10-CM

## 2021-08-07 MED ORDER — LANTUS SOLOSTAR 100 UNIT/ML ~~LOC~~ SOPN: 35.0000 [IU] | PEN_INJECTOR | Freq: Every day | SUBCUTANEOUS | 0 refills | Status: DC

## 2021-08-07 NOTE — Assessment & Plan Note (Addendum)
Patient with pneumonia based on chest x-ray findings.  Based on review of IDSA guidelines patient does not need dual therapy given that she is on Levaquin.  She cannot take a penicillin-based antibiotic given allergies related to those.  She will discontinue the doxycycline.  Somebody will call her on Monday to follow-up.  Discussed the need for repeat chest x-ray in 1 month.  She will seek medical attention for any worsening symptoms.

## 2021-08-07 NOTE — Assessment & Plan Note (Signed)
She will trial Crestor 10 mg once daily to see if she can tolerate this.  If she cannot tolerate it she will discontinue it.

## 2021-08-07 NOTE — Patient Instructions (Signed)
Nice to see. You can discontinue the doxycycline.  You only need to take the Levaquin as this provides adequate coverage for your pneumonia. Please reduce your Lantus to 35 units once daily. Please hold the Farxiga until you are feeling better. You reduce your Crestor to 10 mg once daily and see if you can tolerate this.

## 2021-08-07 NOTE — Assessment & Plan Note (Signed)
Improved on recheck.  She will continue carvedilol, Lasix, and losartan

## 2021-08-07 NOTE — Assessment & Plan Note (Signed)
The patient is getting frequent low sugars.  I have advised her to reduce her Lantus to 35 units once daily.  If she continues to get sugars less than 70 despite this reduction she will let us or her endocrinologist know.  She will continue [place] and Trulicity.  I did discuss having her hold the [place] during her current illness.

## 2021-08-07 NOTE — Progress Notes (Signed)
[name], MD Phone: [phone]  Kelsey Mason is a 81 y.o. female who presents today for f/u.  DIABETES Disease Monitoring: Blood Sugar ranges-90s Polyuria/phagia/dipsia- no      Optho- UTD Medications: Compliance- taking farxiga, trulicity, lantus Hypoglycemic symptoms- 2-3x/week in to the 60s  HYPERTENSION Disease Monitoring Home BP Monitoring 116/71, though has not checked recently Chest pain- no    Dyspnea- no Medications Compliance-  taking coreg, lasix, losartan.   Edema- no BMET    Component Value Date/Time   NA 133 (L) 05/15/2021 1303   NA 137 02/19/2019 0000   NA 135 (L) 01/07/2014 1000   K 4.4 05/15/2021 1303   K 4.7 01/07/2014 1000   CL 100 05/15/2021 1303   CL 98 01/07/2014 1000   CO2 26 05/15/2021 1303   CO2 30 01/07/2014 1000   GLUCOSE 103 (H) 05/15/2021 1303   GLUCOSE 268 (H) 01/07/2014 1000   BUN 22 05/15/2021 1303   BUN 19 02/19/2019 0000   BUN 19 (H) 01/07/2014 1000   CREATININE 1.31 (H) 05/15/2021 1303   CREATININE 1.24 01/07/2014 1000   CALCIUM 9.2 05/15/2021 1303   CALCIUM 10.0 01/07/2014 1000   GFRNONAA 41 (L) 05/15/2021 1303   GFRNONAA 43 (L) 01/07/2014 1000   GFRAA 30 (L) 12/24/2019 0903   GFRAA 50 (L) 01/07/2014 1000   HLD: reports she stopped the crestor due to myalgias in her legs.   Pneumonia: Patient reports getting sick on 07/29/2021.  She had cough and rhinorrhea.  She has taken Tylenol and not improving.  She was evaluated by Dr. Derrel Nip on 08/03/2021.  She was subsequently diagnosed with pneumonia and placed on Levaquin and doxycycline.  She started these antibiotics yesterday.  She feels slightly better today.  She was also placed on prednisone and Tessalon initially though she reports her eyes swelled up and had watery eyes.  She discontinued those things and that has improved.  She has Delsym that she is going to start taking for cough.  She is previously taking Robitussin.   Social History   Tobacco Use  Smoking  Status Former   Packs/day: 0.25   Years: 1.00   Pack years: 0.25   Types: Cigarettes   Quit date: 1963   Years since quitting: 60.1  Smokeless Tobacco Never    Current Outpatient Medications on File Prior to Visit  Medication Sig Dispense Refill   albuterol (VENTOLIN HFA) 108 (90 Base) MCG/ACT inhaler Inhale 2 puffs into the lungs every 6 (six) hours as needed for wheezing or shortness of breath. 3.7 g 11   aspirin 81 MG tablet Take 81 mg by mouth daily.     benzonatate (TESSALON) 200 MG capsule Take 1 capsule (200 mg total) by mouth 3 (three) times daily as needed for cough. 20 capsule 0   blood glucose meter kit and supplies KIT Dispense based on patient and insurance preference. Use up to four times daily as directed. One Touch Ultra mini meter (FOR ICD-9 250.00, 250.01). Dx: E11.9 1 each 0   carvedilol (COREG) 12.5 MG tablet Take 1 tablet (12.5 mg total) by mouth 2 (two) times daily. 180 tablet 0   cholecalciferol (VITAMIN D) 25 MCG (1000 UT) tablet Take 1,000 Units by mouth daily.     dapagliflozin propanediol (FARXIGA) 10 MG TABS tablet Take 10 mg by mouth daily.     Dextromethorphan-guaiFENesin (ROBITUSSIN COLD COUGH+ CHEST PO) Take by mouth as needed.     furosemide (LASIX) 20 MG tablet TAKE 1  TABLET (20 MG TOTAL) BY MOUTH DAILY AS NEEDED (FOR >2LB WEIGHT GAIN OVERNIGHT OR 5 LB WEIGHT GAIN IN 1 WEEK OR SWELLING.). 90 tablet 3   glucose blood (ONE TOUCH ULTRA TEST) test strip USE TO TEST BLOOD SUGAR UP TO 4 TIMES DAILY 100 each 6   levofloxacin (LEVAQUIN) 500 MG tablet Take 1 tablet (500 mg total) by mouth daily. 7 tablet 0   loratadine-pseudoephedrine (CLARITIN-D 24-HOUR) 10-240 MG 24 hr tablet Take 1 tablet by mouth daily.     losartan (COZAAR) 100 MG tablet TAKE 1/2 TABLET (50 MG TOTAL) BY MOUTH 2 (TWO) TIMES DAILY. 90 tablet 1   nitroGLYCERIN (NITROSTAT) 0.4 MG SL tablet PLACE 1 TABLET (0.4 MG TOTAL) UNDER THE TONGUE EVERY 5 (FIVE) MINUTES AS NEEDED FOR CHEST PAIN. MAXIMUM OF 3  DOSES. 25 tablet 3   NOVOFINE PEN NEEDLE 32G X 6 MM MISC USE AS DIRECTED WITH LANTUS PEN 100 each 2   predniSONE (DELTASONE) 10 MG tablet 6 tablets on Day 1 , then reduce by 1 tablet daily until gone 21 tablet 0   rosuvastatin (CRESTOR) 20 MG tablet Take 1 tablet (20 mg total) by mouth daily. 90 tablet 3   TRULICITY 1.5 QR/9.7JO SOPN Inject 1.5 mg as directed once a week.  11   vitamin B-12 (CYANOCOBALAMIN) 1000 MCG tablet Take 1,000 mcg by mouth daily.     No current facility-administered medications on file prior to visit.     ROS see history of present illness  Objective  Physical Exam Vitals:   08/07/21 1345 08/07/21 1403  BP: (!) 160/70 120/70  Pulse: 85   Temp: 98.2 F (36.8 C)   SpO2: 94%     BP Readings from Last 3 Encounters:  08/07/21 120/70  06/17/21 (!) 162/67  05/15/21 (!) 141/59   Wt Readings from Last 3 Encounters:  08/07/21 154 lb (69.9 kg)  08/03/21 151 lb 3.2 oz (68.6 kg)  06/17/21 156 lb 3.2 oz (70.9 kg)    Physical Exam Constitutional:      General: She is not in acute distress.    Appearance: She is not diaphoretic.  Cardiovascular:     Rate and Rhythm: Normal rate and regular rhythm.     Heart sounds: Normal heart sounds.  Pulmonary:     Effort: Pulmonary effort is normal.     Breath sounds: Normal breath sounds.  Skin:    General: Skin is warm and dry.  Neurological:     Mental Status: She is alert.     Assessment/Plan: Please see individual problem list.  Problem List Items Addressed This Visit     Essential hypertension (Chronic)    Improved on recheck.  She will continue carvedilol, Lasix, and losartan      Hyperlipidemia LDL goal <70 (Chronic)    She will trial Crestor 10 mg once daily to see if she can tolerate this.  If she cannot tolerate it she will discontinue it.      Type 2 diabetes mellitus with stage 3 chronic kidney disease, with long-term current use of insulin (HCC) (Chronic)    The patient is getting frequent  low sugars.  I have advised her to reduce her Lantus to 35 units once daily.  If she continues to get sugars less than 70 despite this reduction she will let us or her endocrinologist know.  She will continue [place] and Trulicity.  I did discuss having her hold the [place] during her current illness.  Relevant Medications   insulin glargine (LANTUS SOLOSTAR) 100 UNIT/ML Solostar Pen   Pneumonia    Patient with pneumonia based on chest x-ray findings.  Based on review of IDSA guidelines patient does not need dual therapy given that she is on Levaquin.  She cannot take a penicillin-based antibiotic given allergies related to those.  She will discontinue the doxycycline.  Somebody will call her on Monday to follow-up.  Discussed the need for repeat chest x-ray in 1 month.  She will seek medical attention for any worsening symptoms.         Return for As planned.  This visit occurred during the SARS-CoV-2 public health emergency.  Safety protocols were in place, including screening questions prior to the visit, additional usage of staff PPE, and extensive cleaning of exam room while observing appropriate contact time as indicated for disinfecting solutions.    [name], MD [place]

## 2021-08-11 ENCOUNTER — Other Ambulatory Visit: Payer: Self-pay | Admitting: Internal Medicine

## 2021-08-12 ENCOUNTER — Telehealth: Payer: Self-pay

## 2021-08-12 NOTE — Telephone Encounter (Signed)
-----   Message from Leone Haven, MD sent at 08/07/2021  2:11 PM EST ----- Regarding: call patient Please call the patient and see how she is feeling with the treatment of her pneumonia. Thanks.

## 2021-08-13 MED ORDER — LEVOFLOXACIN 750 MG PO TABS: 750.0000 mg | ORAL_TABLET | ORAL | 0 refills | Status: DC

## 2021-08-13 NOTE — Telephone Encounter (Signed)
Please let the patient know that I sent in an additional 3 doses for her to take. Looking at her kidney function it appears that she needs a different dosing schedule for the levaquin and should take one tablet every other day so I sent the prescription in to reflect this. This should clear things up for her. If she is not feeling quite a better at the end of this regimen she needs to let us know.

## 2021-08-13 NOTE — Telephone Encounter (Signed)
I called and spoke with the patient and informed her that the provider sent in 3 more doses and if she does not feel better after those doses to call and let us know and she understood.  Patient stated that after the farxiga the provider wanted her to start back on the [place] and she has some now but may need a new refill called in later.  Anthony Tamburo,cma

## 2021-08-19 NOTE — Telephone Encounter (Signed)
I called and spoke with the patient and she stated she is feeling much better and does not need a refill of Levoflaxin and I informed her that she can restart the [place] and she understood.  Kyndell Zeiser,cma

## 2021-08-19 NOTE — Telephone Encounter (Signed)
Noted  

## 2021-08-19 NOTE — Telephone Encounter (Signed)
The patient called to inform the provider she has taken her last pill of levoflaxin.

## 2021-09-02 ENCOUNTER — Ambulatory Visit (INDEPENDENT_AMBULATORY_CARE_PROVIDER_SITE_OTHER): Payer: Medicare PPO

## 2021-09-02 ENCOUNTER — Ambulatory Visit: Payer: Medicare PPO | Admitting: Family Medicine

## 2021-09-02 ENCOUNTER — Other Ambulatory Visit: Payer: Self-pay

## 2021-09-02 ENCOUNTER — Encounter: Payer: Self-pay | Admitting: Family Medicine

## 2021-09-02 VITALS — BP 150/70 | HR 72 | Temp 98.6°F | Ht 62.5 in | Wt 149.4 lb

## 2021-09-02 DIAGNOSIS — E1169 Type 2 diabetes mellitus with other specified complication: Secondary | ICD-10-CM

## 2021-09-02 DIAGNOSIS — Z794 Long term (current) use of insulin: Secondary | ICD-10-CM

## 2021-09-02 DIAGNOSIS — K219 Gastro-esophageal reflux disease without esophagitis: Secondary | ICD-10-CM | POA: Diagnosis not present

## 2021-09-02 DIAGNOSIS — R918 Other nonspecific abnormal finding of lung field: Secondary | ICD-10-CM | POA: Diagnosis not present

## 2021-09-02 DIAGNOSIS — J189 Pneumonia, unspecified organism: Secondary | ICD-10-CM

## 2021-09-02 DIAGNOSIS — N1832 Chronic kidney disease, stage 3b: Secondary | ICD-10-CM | POA: Diagnosis not present

## 2021-09-02 DIAGNOSIS — E785 Hyperlipidemia, unspecified: Secondary | ICD-10-CM

## 2021-09-02 DIAGNOSIS — E1122 Type 2 diabetes mellitus with diabetic chronic kidney disease: Secondary | ICD-10-CM

## 2021-09-02 LAB — COMPREHENSIVE METABOLIC PANEL
ALT: 15 U/L (ref 0–35)
AST: 15 U/L (ref 0–37)
Albumin: 4.1 g/dL (ref 3.5–5.2)
Alkaline Phosphatase: 79 U/L (ref 39–117)
BUN: 10 mg/dL (ref 6–23)
CO2: 28 mEq/L (ref 19–32)
Calcium: 8.9 mg/dL (ref 8.4–10.5)
Chloride: 101 mEq/L (ref 96–112)
Creatinine, Ser: 1.25 mg/dL — ABNORMAL HIGH (ref 0.40–1.20)
GFR: 40.72 mL/min — ABNORMAL LOW (ref >60.00)
Glucose, Bld: 155 mg/dL — ABNORMAL HIGH (ref 70–99)
Potassium: 4.2 mEq/L (ref 3.5–5.1)
Sodium: 135 mEq/L (ref 135–145)
Total Bilirubin: 0.8 mg/dL (ref 0.2–1.2)
Total Protein: 6 g/dL (ref 6.0–8.3)

## 2021-09-02 LAB — LDL CHOLESTEROL, DIRECT: Direct LDL: 64 mg/dL

## 2021-09-02 MED ORDER — OMEPRAZOLE 20 MG PO CPDR: 20.0000 mg | DELAYED_RELEASE_CAPSULE | Freq: Every day | ORAL | 3 refills | Status: DC

## 2021-09-02 MED ORDER — ROSUVASTATIN CALCIUM 20 MG PO TABS: 10.0000 mg | ORAL_TABLET | Freq: Every day | ORAL | 3 refills | Status: DC

## 2021-09-02 NOTE — Progress Notes (Signed)
[name], MD Phone: [phone]  Kelsey Mason is a 81 y.o. female who presents today for follow-up.  Pneumonia: Patient reports she feels quite a bit better.  She is still recovering.  She reports progressive improvement.  She wonders why she has had pneumonia two times over the last several years.  She does report reflux issues and occasionally feels as though saliva has trouble going down through her throat but has no trouble swallowing foods or liquids.  She takes Tums 1-2 times a week.  She notes this has been going on for decades.  No blood in her stool.  No epigastric pain.  She reports she had an EGD years ago that revealed an ulcer.  On chart review it appears her most recent episode of pneumonia prior to January was in 2020.  HYPERLIPIDEMIA Symptoms Chest pain on exertion:  no   Leg claudication:   no Medications: Compliance- taking crestor 10 mg daily Right upper quadrant pain- no  Muscle aches- no Lipid Panel     Component Value Date/Time   CHOL 151 11/06/2019 1001   CHOL 156 08/01/2019 0854   TRIG 154.0 (H) 11/06/2019 1001   HDL 47.10 11/06/2019 1001   HDL 45 08/01/2019 0854   CHOLHDL 3 11/06/2019 1001   VLDL 30.8 11/06/2019 1001   LDLCALC 73 11/06/2019 1001   LDLCALC 88 08/01/2019 0854   LDLDIRECT 60.0 05/08/2020 1118   LABVLDL 23 08/01/2019 0854   DIABETES Disease Monitoring: Blood Sugar ranges-126-160   Polyuria/phagia/dipsia- no      Optho- UTD Medications: Compliance-taking Lantus 35 units once daily (she notes the needles have been bending as they are new brand this occurs 2-3 times a week and she is only been getting about 8 units when this occurs), Trulicity, and [place]. Hypoglycemic symptoms-no   Social History   Tobacco Use  Smoking Status Former   Packs/day: 0.25   Years: 1.00   Pack years: 0.25   Types: Cigarettes   Quit date: 1963   Years since quitting: 60.2  Smokeless Tobacco Never    Current Outpatient Medications on File  Prior to Visit  Medication Sig Dispense Refill   albuterol (VENTOLIN HFA) 108 (90 Base) MCG/ACT inhaler Inhale 2 puffs into the lungs every 6 (six) hours as needed for wheezing or shortness of breath. 3.7 g 11   aspirin 81 MG tablet Take 81 mg by mouth daily.     benzonatate (TESSALON) 200 MG capsule Take 1 capsule (200 mg total) by mouth 3 (three) times daily as needed for cough. 20 capsule 0   blood glucose meter kit and supplies KIT Dispense based on patient and insurance preference. Use up to four times daily as directed. One Touch Ultra mini meter (FOR ICD-9 250.00, 250.01). Dx: E11.9 1 each 0   carvedilol (COREG) 12.5 MG tablet Take 1 tablet (12.5 mg total) by mouth 2 (two) times daily. 180 tablet 0   cholecalciferol (VITAMIN D) 25 MCG (1000 UT) tablet Take 1,000 Units by mouth daily.     dapagliflozin propanediol (FARXIGA) 10 MG TABS tablet Take 10 mg by mouth daily.     Dextromethorphan-guaiFENesin (ROBITUSSIN COLD COUGH+ CHEST PO) Take by mouth as needed.     furosemide (LASIX) 20 MG tablet TAKE 1 TABLET (20 MG TOTAL) BY MOUTH DAILY AS NEEDED (FOR >2LB WEIGHT GAIN OVERNIGHT OR 5 LB WEIGHT GAIN IN 1 WEEK OR SWELLING.). 90 tablet 3   glucose blood (ONE TOUCH ULTRA TEST) test strip USE TO TEST  BLOOD SUGAR UP TO 4 TIMES DAILY 100 each 6   insulin glargine (LANTUS SOLOSTAR) 100 UNIT/ML Solostar Pen Inject 35 Units into the skin daily. 15 mL 0   levofloxacin (LEVAQUIN) 750 MG tablet Take 1 tablet (750 mg total) by mouth every other day. 3 tablet 0   loratadine-pseudoephedrine (CLARITIN-D 24-HOUR) 10-240 MG 24 hr tablet Take 1 tablet by mouth daily.     losartan (COZAAR) 100 MG tablet TAKE 1/2 TABLET (50 MG TOTAL) BY MOUTH 2 (TWO) TIMES DAILY. 90 tablet 1   nitroGLYCERIN (NITROSTAT) 0.4 MG SL tablet PLACE 1 TABLET (0.4 MG TOTAL) UNDER THE TONGUE EVERY 5 (FIVE) MINUTES AS NEEDED FOR CHEST PAIN. MAXIMUM OF 3 DOSES. 25 tablet 3   NOVOFINE PEN NEEDLE 32G X 6 MM MISC USE AS DIRECTED WITH LANTUS PEN 100  each 2   predniSONE (DELTASONE) 10 MG tablet 6 tablets on Day 1 , then reduce by 1 tablet daily until gone 21 tablet 0   TRULICITY 1.5 XK/5.5VZ SOPN Inject 1.5 mg as directed once a week.  11   vitamin B-12 (CYANOCOBALAMIN) 1000 MCG tablet Take 1,000 mcg by mouth daily.     No current facility-administered medications on file prior to visit.     ROS see history of present illness  Objective  Physical Exam Vitals:   09/02/21 0811  BP: (!) 150/70  Pulse: 72  Temp: 98.6 F (37 C)  SpO2: 99%    BP Readings from Last 3 Encounters:  09/02/21 (!) 150/70  08/07/21 120/70  06/17/21 (!) 162/67   Wt Readings from Last 3 Encounters:  09/02/21 149 lb 6.4 oz (67.8 kg)  08/07/21 154 lb (69.9 kg)  08/03/21 151 lb 3.2 oz (68.6 kg)    Physical Exam Constitutional:      General: She is not in acute distress.    Appearance: She is not diaphoretic.  Cardiovascular:     Rate and Rhythm: Normal rate and regular rhythm.     Heart sounds: Normal heart sounds.  Pulmonary:     Effort: Pulmonary effort is normal.     Breath sounds: Normal breath sounds.  Chest:    Abdominal:     General: Bowel sounds are normal. There is no distension.     Palpations: Abdomen is soft.     Tenderness: There is no abdominal tenderness. There is no guarding or rebound.  Skin:    General: Skin is warm and dry.  Neurological:     Mental Status: She is alert.   Diabetic Foot Exam - Simple   Simple Foot Form Diabetic Foot exam was performed with the following findings: Yes 09/02/2021  8:52 AM  Visual Inspection No deformities, no ulcerations, no other skin breakdown bilaterally: Yes Sensation Testing Intact to touch and monofilament testing bilaterally: Yes Pulse Check Posterior Tibialis and Dorsalis pulse intact bilaterally: Yes Comments      Assessment/Plan: Please see individual problem list.  Problem List Items Addressed This Visit     Hyperlipidemia associated with type 2 diabetes mellitus  ([place]) (Chronic)    The patient has been able to tolerate the 10 mg dose of Crestor.  She will continue with this.  We will check her LDL today.      Relevant Medications   rosuvastatin (CRESTOR) 20 MG tablet   Type 2 diabetes mellitus with stage 3 chronic kidney disease, with long-term current use of insulin (HCC) (Chronic)    Trended up slightly and this may be related to her not  getting the full amount of insulin when her needles bend.  She will check with the pharmacy to see if she can get the prior brand.  She will continue her Lantus 35 units once daily.  She will continue Trulicity 1.5 mg weekly and Farxiga 10 mg daily.  She will have her A1c through her endocrinologist in several weeks.      Relevant Medications   rosuvastatin (CRESTOR) 20 MG tablet   GERD (gastroesophageal reflux disease)    The patient reports this is a chronic issue.  We will treat her with omeprazole.  We will follow-up in 3 months.  She does report having had a prior EGD.      Relevant Medications   omeprazole (PRILOSEC) 20 MG capsule   Pneumonia - Primary    The patient has recovered quite well.  Discussed it may take a long time for her to fully get back to her baseline given how long she felt ill for.  Discussed that it appears she has had 2 episodes of pneumonia in the past 2.5 years.  Discussed that this is not severely concerning given her age and history of diabetes though does warrant some work-up for potential underlying cause.  Certainly her reflux issues could be contributing though the pattern of her most recent pneumonia does not seem consistent with aspiration.  We will treat her reflux.  We will do some immunoglobulin testing.  She will have a follow-up chest x-ray today to evaluate for clearance in her lungs.      Relevant Orders   DG Chest 2 View   IgG, IgA, IgM   Other Visit Diagnoses     Hyperlipidemia, unspecified hyperlipidemia type       Relevant Medications   rosuvastatin (CRESTOR) 20  MG tablet   Other Relevant Orders   Direct LDL   Comp Met (CMET)       Return in about 3 months (around 12/03/2021) for GERD.  This visit occurred during the SARS-CoV-2 public health emergency.  Safety protocols were in place, including screening questions prior to the visit, additional usage of staff PPE, and extensive cleaning of exam room while observing appropriate contact time as indicated for disinfecting solutions.    [name], MD [place]

## 2021-09-02 NOTE — Assessment & Plan Note (Addendum)
Trended up slightly and this may be related to her not getting the full amount of insulin when her needles bend.  She will check with the pharmacy to see if she can get the prior brand.  She will continue her Lantus 35 units once daily.  She will continue Trulicity 1.5 mg weekly and Farxiga 10 mg daily.  She will have her A1c through her endocrinologist in several weeks. ?

## 2021-09-02 NOTE — Assessment & Plan Note (Signed)
The patient has recovered quite well.  Discussed it may take a long time for her to fully get back to her baseline given how long she felt ill for.  Discussed that it appears she has had 2 episodes of pneumonia in the past 2.5 years.  Discussed that this is not severely concerning given her age and history of diabetes though does warrant some work-up for potential underlying cause.  Certainly her reflux issues could be contributing though the pattern of her most recent pneumonia does not seem consistent with aspiration.  We will treat her reflux.  We will do some immunoglobulin testing.  She will have a follow-up chest x-ray today to evaluate for clearance in her lungs. ?

## 2021-09-02 NOTE — Assessment & Plan Note (Signed)
The patient reports this is a chronic issue.  We will treat her with omeprazole.  We will follow-up in 3 months.  She does report having had a prior EGD. ?

## 2021-09-02 NOTE — Patient Instructions (Signed)
Nice to see you. ?We will get labs and an x-ray today.  ?Please start on the omeprazole. If it is not beneficial for your reflux in the next 2 weeks please let me know.  ?

## 2021-09-02 NOTE — Assessment & Plan Note (Signed)
The patient has been able to tolerate the 10 mg dose of Crestor.  She will continue with this.  We will check her LDL today. ?

## 2021-09-03 LAB — IGG, IGA, IGM
IgG (Immunoglobin G), Serum: 488 mg/dL — ABNORMAL LOW (ref 600–1540)
IgM, Serum: 37 mg/dL — ABNORMAL LOW (ref 50–300)
Immunoglobulin A: 134 mg/dL (ref 70–320)

## 2021-09-13 ENCOUNTER — Other Ambulatory Visit: Payer: Self-pay | Admitting: Family Medicine

## 2021-09-13 ENCOUNTER — Other Ambulatory Visit: Payer: Self-pay | Admitting: Oncology

## 2021-09-13 ENCOUNTER — Encounter: Payer: Self-pay | Admitting: Oncology

## 2021-09-13 DIAGNOSIS — R9389 Abnormal findings on diagnostic imaging of other specified body structures: Secondary | ICD-10-CM

## 2021-09-13 DIAGNOSIS — R771 Abnormality of globulin: Secondary | ICD-10-CM

## 2021-09-13 HISTORY — DX: Abnormality of globulin: R77.1

## 2021-09-14 ENCOUNTER — Telehealth: Payer: Self-pay

## 2021-09-14 NOTE — Telephone Encounter (Signed)
-----   Message from Earlie Server, MD sent at 09/13/2021  2:10 PM EDT ----- ?Please move her app to first week of April. Lab MD IVIG ?----- Message ----- ?From: Leone Haven, MD ?Sent: 09/13/2021   8:13 AM EDT ?To: Earlie Server, MD ? ?Noted. I will forward to Dr Tasia Catchings so she is aware of these results that were obtained in the setting of having pneumonia a couple of times over the past few years.  ? ?

## 2021-09-14 NOTE — Telephone Encounter (Signed)
Patient informed of plan to move appts up.  ? ?Please schedule patient for lab/MD/ IVIG* new- the first week of April and inform pt of appts. Please cancel appts in May.  ?

## 2021-09-17 DIAGNOSIS — E1129 Type 2 diabetes mellitus with other diabetic kidney complication: Secondary | ICD-10-CM | POA: Diagnosis not present

## 2021-09-17 DIAGNOSIS — R809 Proteinuria, unspecified: Secondary | ICD-10-CM | POA: Diagnosis not present

## 2021-09-17 DIAGNOSIS — Z794 Long term (current) use of insulin: Secondary | ICD-10-CM | POA: Diagnosis not present

## 2021-09-25 ENCOUNTER — Other Ambulatory Visit: Payer: Self-pay

## 2021-09-25 ENCOUNTER — Ambulatory Visit
Admission: RE | Admit: 2021-09-25 | Discharge: 2021-09-25 | Disposition: A | Discharge status: Home / Self Care | Payer: Medicare PPO | Source: Ambulatory Visit | Attending: Family Medicine | Admitting: Family Medicine

## 2021-09-25 DIAGNOSIS — R9389 Abnormal findings on diagnostic imaging of other specified body structures: Secondary | ICD-10-CM | POA: Diagnosis not present

## 2021-09-25 DIAGNOSIS — I2510 Atherosclerotic heart disease of native coronary artery without angina pectoris: Secondary | ICD-10-CM | POA: Diagnosis not present

## 2021-09-25 DIAGNOSIS — J439 Emphysema, unspecified: Secondary | ICD-10-CM | POA: Diagnosis not present

## 2021-09-25 DIAGNOSIS — Z8701 Personal history of pneumonia (recurrent): Secondary | ICD-10-CM | POA: Diagnosis not present

## 2021-09-25 DIAGNOSIS — I700 Atherosclerosis of aorta: Secondary | ICD-10-CM | POA: Diagnosis not present

## 2021-09-28 NOTE — Progress Notes (Deleted)
?Cardiology Office Note:   ? ?Date:  09/28/2021  ? ?ID:  Kelsey Mason, DOB 06-05-1941, MRN [id number] ? ?PCP:  Leone Haven, MD  ?[facility] Cardiologist:  Nelva Bush, MD  ?[facility] Electrophysiologist:  None  ? ?Referring MD: Leone Haven, MD  ? ?Chief Complaint: 6 month follow-up ? ?History of Present Illness:   ? ?Kelsey Mason is a 81 y.o. female with a hx of nonobstructive CAD, peripheral vascular disease with small vessel disease involving the runoff vessels, nonischemic cardiomyopathy, mitral regurgitation, hypertension, hyperlipidemia, type 2 diabetes mellitus, mild to moderate renal artery stenosis, and breast cancer, who presents for follow-up. ? ?Last seen 04/01/21 and was feeling fairly well. She was using lasix more due to  ? ?Past Medical History:  ?Diagnosis Date  ? Arthritis   ? fingers  ? Breast symptom 2014  ? Cancer [facility]) 2014  ? Bilateral Breast, chemo Dr. Jeb Levering  ? Degenerative disc disease, lumbar   ? Diabetes mellitus   ? GERD (gastroesophageal reflux disease)   ? mild  ? Hypertension   ? 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 Personal history of chemotherapy   ? Personal history of radiation therapy 2014  ? bilat lumpectomy  ? ? ?Past Surgical History:  ?Procedure Laterality Date  ? ABDOMINAL AORTOGRAM W/LOWER EXTREMITY N/A 04/21/2018  ? Procedure: ABDOMINAL AORTOGRAM W/LOWER EXTREMITY;  Surgeon: Nelva Bush, MD;  Location: [place] [facility];  Service: Cardiovascular;  Laterality: N/A;  ? ABDOMINAL HYSTERECTOMY  1976  ? for pelvic pain  ? BREAST EXCISIONAL BIOPSY Bilateral 11/2012  ? bilat breast ca chemo and rad  ? BREAST SURGERY Bilateral 2014  ? bilateral lumpectomy and SN biopsy  ? CATARACT EXTRACTION W/PHACO Left 07/30/2020  ? Procedure: CATARACT EXTRACTION PHACO AND INTRAOCULAR LENS PLACEMENT ([facility]) LEFT DIABETIC 18.73 01:45.0 17.8%;  Surgeon: Leandrew Koyanagi, MD;  Location: [place];  Service: Ophthalmology;  Laterality: Left;   Diabetic - insulin and opral meds  ? CATARACT EXTRACTION W/PHACO Right 08/13/2020  ? Procedure: CATARACT EXTRACTION PHACO AND INTRAOCULAR LENS PLACEMENT ([place]) RIGHT DIABETIC;  Surgeon: Leandrew Koyanagi, MD;  Location: [place];  Service: Ophthalmology;  Laterality: Right;  10.67 ?1:27.8 ?12.1%  ? CHOLECYSTECTOMY  2007  ? COLONOSCOPY WITH PROPOFOL N/A 10/13/2015  ? Procedure: COLONOSCOPY WITH PROPOFOL;  Surgeon: Manya Silvas, MD;  Location: [facility];  Service: Endoscopy;  Laterality: N/A;  ? LEFT HEART CATH AND CORONARY ANGIOGRAPHY N/A 04/21/2018  ? Procedure: LEFT HEART CATH AND CORONARY ANGIOGRAPHY;  Surgeon: Nelva Bush, MD;  Location: [place] [facility];  Service: Cardiovascular;  Laterality: N/A;  ? OOPHORECTOMY Right   ? PORTACATH PLACEMENT  2014  ? TUMOR REMOVAL Right 1980  ? right foot  ? ? ?Current Medications: ?No outpatient medications have been marked as taking for the 09/29/21 encounter (Appointment) with Kathlen Mody, Ortencia Askari H, PA-C.  ?  ? ?Allergies:   Advil [ibuprofen], Aleve [naproxen], Fluticasone-salmeterol, Lipitor [atorvastatin], Naproxen sodium, and Penicillins  ? ?Social History  ? ?Socioeconomic History  ? Marital status: Widowed  ?  Spouse name: Not on file  ? Number of children: Not on file  ? Years of education: Not on file  ? Highest education level: Not on file  ?Occupational History  ? Not on file  ?Tobacco Use  ? Smoking status: Former  ?  Packs/day: 0.25  ?  Years: 1.00  ?  Pack years: 0.25  ?  Types: Cigarettes  ?  Quit date: 1963  ?  Years since quitting: 60.2  ? Smokeless tobacco: Never  ?Vaping Use  ? Vaping Use: Never used  ?Substance and Sexual Activity  ? Alcohol use: No  ?  Alcohol/week: 0.0 standard drinks  ? Drug use: No  ? Sexual activity: Never  ?Other Topics Concern  ? Not on file  ?Social History Narrative  ? Lives in [place].   ?   ? 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 Diet - regular  ? Exercise - dance 3x per week  ? ?Social Determinants of Health   ? ?Financial Resource Strain: Low Risk   ? Difficulty of Paying Living Expenses: Not hard at all  ?Food Insecurity: No Food Insecurity  ? Worried About Charity fundraiser in the Last Year: Never true  ? Ran Out of Food in the Last Year: Never true  ?Transportation Needs: No Transportation Needs  ? Lack of Transportation (Medical): No  ? Lack of Transportation (Non-Medical): No  ?Physical Activity: Sufficiently Active  ? Days of Exercise per Week: 2 days  ? Minutes of Exercise per Session: 150+ min  ?Stress: No Stress Concern Present  ? Feeling of Stress : Not at all  ?Social Connections: Unknown  ? Frequency of Communication with Friends and Family: More than three times a week  ? Frequency of Social Gatherings with Friends and Family: Twice a week  ? Attends Religious Services: Not on file  ? Active Member of Clubs or Organizations: Not on file  ? Attends Archivist Meetings: Not on file  ? Marital Status: Not on file  ?  ? ?Family History: ?The patient's ***family history includes Alcohol abuse in her father and mother; Breast cancer (age of onset: 26) in her mother; Heart attack in her brother; Heart disease in her brother, father, and mother; Hypertension in her father and mother. ? ?ROS:   ?Please see the history of present illness.    ?*** All other systems reviewed and are negative. ? ?EKGs/Labs/Other Studies Reviewed:   ? ?The following studies were reviewed today: ?*** ? ?EKG:  EKG is *** ordered today.  The ekg ordered today demonstrates *** ? ?Recent Labs: ?05/15/2021: Hemoglobin 12.1; Platelets 164 ?09/02/2021: ALT 15; BUN 10; Creatinine, Ser 1.25; Potassium 4.2; Sodium 135  ?Recent Lipid Panel ?   ?Component Value Date/Time  ? CHOL 151 11/06/2019 1001  ? CHOL 156 08/01/2019 0854  ? TRIG 154.0 (H) 11/06/2019 1001  ? HDL 47.10 11/06/2019 1001  ? HDL 45 08/01/2019 0854  ? CHOLHDL 3 11/06/2019 1001  ? VLDL 30.8 11/06/2019 1001  ? LDLCALC 73 11/06/2019 1001  ? [place] 88 08/01/2019 0854  ?  LDLDIRECT 64.0 09/02/2021 0833  ? ? ? ?Risk Assessment/Calculations:   ?{Does this patient have ATRIAL FIBRILLATION?[phone]} ? ? ?Physical Exam:   ? ?VS:  There were no vitals taken for this visit.   ? ?Wt Readings from Last 3 Encounters:  ?09/02/21 149 lb 6.4 oz (67.8 kg)  ?08/07/21 154 lb (69.9 kg)  ?08/03/21 151 lb 3.2 oz (68.6 kg)  ?  ? ?GEN: *** Well nourished, well developed in no acute distress ?HEENT: Normal ?NECK: No JVD; No carotid bruits ?LYMPHATICS: No lymphadenopathy ?CARDIAC: ***RRR, no murmurs, rubs, gallops ?RESPIRATORY:  Clear to auscultation without rales, wheezing or rhonchi  ?ABDOMEN: Soft, non-tender, non-distended ?MUSCULOSKELETAL:  No edema; No deformity  ?SKIN: Warm and dry ?NEUROLOGIC:  Alert and oriented x 3 ?PSYCHIATRIC:  Normal affect  ? ?ASSESSMENT:   ? ?No diagnosis found. ?PLAN:   ? ?  In order of problems listed above: ? ?*** ? ?Disposition: Follow up {follow up:15908} with ***  ? ?Shared Decision Making/Informed Consent   ?{Are you ordering a CV Procedure (e.g. stress test, cath, DCCV, TEE, etc)?   Press F2        :[id number]}  ? ? ?Signed, ?[place] Warriner Ninfa Meeker, PA-C  ?09/28/2021 1:20 PM    ?[place]  ?

## 2021-09-29 ENCOUNTER — Other Ambulatory Visit: Payer: Self-pay

## 2021-09-29 ENCOUNTER — Ambulatory Visit: Payer: Medicare PPO | Admitting: Medical

## 2021-10-05 ENCOUNTER — Inpatient Hospital Stay: Payer: Medicare PPO | Admitting: Oncology

## 2021-10-05 ENCOUNTER — Encounter: Payer: Self-pay | Admitting: Oncology

## 2021-10-05 ENCOUNTER — Inpatient Hospital Stay: Payer: Medicare PPO | Attending: Oncology

## 2021-10-05 ENCOUNTER — Inpatient Hospital Stay: Payer: Medicare PPO

## 2021-10-05 VITALS — BP 175/67 | HR 72 | Temp 98.7°F | Resp 20 | Wt 149.1 lb

## 2021-10-05 VITALS — BP 168/55 | HR 71 | Temp 99.1°F | Resp 18

## 2021-10-05 DIAGNOSIS — R771 Abnormality of globulin: Secondary | ICD-10-CM

## 2021-10-05 DIAGNOSIS — D801 Nonfamilial hypogammaglobulinemia: Secondary | ICD-10-CM | POA: Insufficient documentation

## 2021-10-05 DIAGNOSIS — I129 Hypertensive chronic kidney disease with stage 1 through stage 4 chronic kidney disease, or unspecified chronic kidney disease: Secondary | ICD-10-CM | POA: Diagnosis not present

## 2021-10-05 DIAGNOSIS — N184 Chronic kidney disease, stage 4 (severe): Secondary | ICD-10-CM | POA: Diagnosis not present

## 2021-10-05 DIAGNOSIS — Z87891 Personal history of nicotine dependence: Secondary | ICD-10-CM | POA: Insufficient documentation

## 2021-10-05 DIAGNOSIS — M810 Age-related osteoporosis without current pathological fracture: Secondary | ICD-10-CM | POA: Diagnosis not present

## 2021-10-05 DIAGNOSIS — E1122 Type 2 diabetes mellitus with diabetic chronic kidney disease: Secondary | ICD-10-CM | POA: Diagnosis not present

## 2021-10-05 DIAGNOSIS — Z08 Encounter for follow-up examination after completed treatment for malignant neoplasm: Secondary | ICD-10-CM

## 2021-10-05 DIAGNOSIS — Z803 Family history of malignant neoplasm of breast: Secondary | ICD-10-CM | POA: Diagnosis not present

## 2021-10-05 DIAGNOSIS — Z90710 Acquired absence of both cervix and uterus: Secondary | ICD-10-CM | POA: Diagnosis not present

## 2021-10-05 DIAGNOSIS — Z853 Personal history of malignant neoplasm of breast: Secondary | ICD-10-CM | POA: Insufficient documentation

## 2021-10-05 LAB — CBC WITH DIFFERENTIAL/PLATELET
Abs Immature Granulocytes: 0.09 10*3/uL — ABNORMAL HIGH (ref 0.00–0.07)
Basophils Absolute: 0.1 10*3/uL (ref 0.0–0.1)
Basophils Relative: 1 %
Eosinophils Absolute: 0.9 10*3/uL — ABNORMAL HIGH (ref 0.0–0.5)
Eosinophils Relative: 8 %
HCT: 38.6 % (ref 36.0–46.0)
Hemoglobin: 12.9 g/dL (ref 12.0–15.0)
Immature Granulocytes: 1 %
Lymphocytes Relative: 13 %
Lymphs Abs: 1.4 10*3/uL (ref 0.7–4.0)
MCH: 29.7 pg (ref 26.0–34.0)
MCHC: 33.4 g/dL (ref 30.0–36.0)
MCV: 88.9 fL (ref 80.0–100.0)
Monocytes Absolute: 0.7 10*3/uL (ref 0.1–1.0)
Monocytes Relative: 6 %
Neutro Abs: 7.5 10*3/uL (ref 1.7–7.7)
Neutrophils Relative %: 71 %
Platelets: 198 10*3/uL (ref 150–400)
RBC: 4.34 MIL/uL (ref 3.87–5.11)
RDW: 13.1 % (ref 11.5–15.5)
WBC: 10.6 10*3/uL — ABNORMAL HIGH (ref 4.0–10.5)
nRBC: 0 % (ref 0.0–0.2)

## 2021-10-05 LAB — COMPREHENSIVE METABOLIC PANEL
ALT: 11 U/L (ref 0–44)
AST: 15 U/L (ref 15–41)
Albumin: 4.3 g/dL (ref 3.5–5.0)
Alkaline Phosphatase: 73 U/L (ref 38–126)
Anion gap: 7 (ref 5–15)
BUN: 14 mg/dL (ref 8–23)
CO2: 26 mmol/L (ref 22–32)
Calcium: 9 mg/dL (ref 8.9–10.3)
Chloride: 100 mmol/L (ref 98–111)
Creatinine, Ser: 1.2 mg/dL — ABNORMAL HIGH (ref 0.44–1.00)
GFR, Estimated: 46 mL/min — ABNORMAL LOW (ref >60)
Glucose, Bld: 143 mg/dL — ABNORMAL HIGH (ref 70–99)
Potassium: 4.3 mmol/L (ref 3.5–5.1)
Sodium: 133 mmol/L — ABNORMAL LOW (ref 135–145)
Total Bilirubin: 1 mg/dL (ref 0.3–1.2)
Total Protein: 6.4 g/dL — ABNORMAL LOW (ref 6.5–8.1)

## 2021-10-05 MED ORDER — IMMUNE GLOBULIN (HUMAN) 5 GM/50ML IV SOLN
25.0000 g | Freq: Once | INTRAVENOUS | Status: AC
  Administered 2021-10-05: 25 g via INTRAVENOUS
  Filled 2021-10-05: qty 200

## 2021-10-05 MED ORDER — ACETAMINOPHEN 325 MG PO TABS
650.0000 mg | ORAL_TABLET | Freq: Once | ORAL | Status: AC
  Administered 2021-10-05: 650 mg via ORAL
  Filled 2021-10-05: qty 2

## 2021-10-05 MED ORDER — DEXTROSE 5 % IV SOLN
Freq: Once | INTRAVENOUS | Status: AC
  Filled 2021-10-05: qty 250

## 2021-10-05 MED ORDER — DIPHENHYDRAMINE HCL 25 MG PO CAPS
25.0000 mg | ORAL_CAPSULE | Freq: Once | ORAL | Status: AC
  Administered 2021-10-05: 25 mg via ORAL
  Filled 2021-10-05: qty 1

## 2021-10-05 NOTE — Progress Notes (Signed)
[place] ? ?Clinic day:  10/05/2021 ? ?Chief Complaint: Kelsey Mason is a 81 y.o. female with bilateral stage IA breast cancer presents for follow-up ?[name] ?PERTINENT ONCOLOGY HISTORY ?Kelsey Mason is a 81 y.o.afemale who has above oncology history reviewed by me today presented for follow up visit for management of history of bilateral breast cancer. ?Patient previously follows up with Dr. Mike Gip. Switched care to me on 09/18/2018. ?Medical record review was performed by me. ? ?bilateral breast cancer s/p lumpectomy and sentinel lymph node biopsy on 11/17/2012. ? ?Right breast revealed a 5 mm grade I invasive carcinoma.  There was no DCIS.  Two sentinel lymph nodes were negative.  Pathologic stage was T1aN0M0.  Tumor was ER + (90%), PR + (20%), and Her2/neu -.  ? ?Left breast revealed a 1.8 cm grade III invasive ductal carcinoma.  There was lymphovascular invasion.  There was no DCIS.  One sentinel lymph node was negative.  Pathologic stage was T1cN0M0.  Tumor was ER + (>90%), PR + (1-5%), and Her2/neu -. ? ?Oncotype DX testing on the left breast mass revealed a recurrence score of 28 which corresponded to a 10 year risk of distant recurrence of 19% (CI 15-23%) with tamoxifen alone. ? ?BCI testing on 10/04/2017 revealed a high risk of late recurrence (5.4%; CI 2.5%-8.2%) during years 5-10.  There was a low likelihood of benefit from extended endocrine therapy. ? ?She received Adriamycin and Cytoxan (AC) x 4 cycles followed by weekly Taxol x 10 (completed 06/05/2013).  She did not receive 2 cycles of Taxol secondary to progressive neuropathy.  She received bilateral breast radiation (07/2013 - 09/2013).  She began letrozole (Femara) in 09/2013. ? ?CA27.29 has been followed:  24.4 on 09/10/2016, 28.1 on 03/14/2017, 24.9 on 09/12/2017, and 22.8 on 03/13/2018. ?She has a history of a borderline mild normocytic anemia.  Diet appears modest.  She denies any melena,  hematochezia, hematuria or vaginal bleeding. Colonoscopy on 10/13/2015 was negative.   ? ?Soft tissue neck ultrasound on 03/16/2017 revealed no fluid collection or soft tissue lesion. ? ?09/2013- 12/2019 Letrozole, which was discontinued due to worsening of osteoporosis and  ?Her BCI was obtained previously 10/04/2017 revealed a high risk of late recurrence (5.4%; CI 2.5%-8.2%) during years 5-10.  There was a low likelihood of benefit from extended endocrine therapy ?bone density done on 12/20/2019 ?Her T score was -2.3 in 2019 now left femoral neck T score at -2.5. ?She has progressed to osteoporosis state. ?I have previously discussed with patient about dental clearance and bisphosphonate use.  At that time, patient decided not to proceed with intervention due to COVID-19 pandemic ?  ? ? ? ?INTERVAL HISTORY ?Kelsey Mason is a 81 y.o. female who has above history reviewed by me today presents for follow up visit for history of breast cancer, and hypoglobulinemia.  ? ?Per Dr. Davonna Belling, patient has had recurrent sinopulmonary infections.  ?09/02/2021 immunoglobulin level has been checked recently which showed decreased IgG level 488.  ?09/27/2021, CT chest without contrast showed patchy groundglass infiltration in the right middle lobe and both lower lobes suggesting multifocal pneumonia and 2.2 x 0.6 cm linear nodular density in the left lower lobe which may be part of atelectasis/pneumonia underlying neoplastic process.  Recommend short-term CT follow-up.  Extensive coronary artery calcification. ? ?Patient reports mild occasional cough which she feels is allergy related.  She also has some runny nose.  Patient will see pulmonology Dr. Patsey Berthold for evaluation. ? ? ? ?  Problems and complaints are listed below: ?Patient reports doing well.  She has chronic aches and pains but ? ?Review of Systems  ?Constitutional:  Negative for appetite change, chills, fatigue and fever.  ?HENT:   Negative for hearing loss  and voice change.   ?Eyes:  Negative for eye problems.  ?Respiratory:  Positive for cough. Negative for chest tightness.   ?Cardiovascular:  Negative for chest pain.  ?Gastrointestinal:  Negative for abdominal distention, abdominal pain, blood in stool and nausea.  ?Endocrine: Negative for hot flashes.  ?Genitourinary:  Negative for difficulty urinating and frequency.   ?Musculoskeletal:  Positive for arthralgias.  ?Skin:  Negative for itching and rash.  ?Neurological:  Negative for extremity weakness and headaches.  ?Hematological:  Negative for adenopathy.  ?Psychiatric/Behavioral:  Negative for confusion.   ? ? ?Past Medical History:  ?Diagnosis Date  ? Arthritis   ? fingers  ? Breast symptom 2014  ? Cancer [facility]) 2014  ? Bilateral Breast, chemo Dr. Jeb Levering  ? Degenerative disc disease, lumbar   ? Diabetes mellitus   ? GERD (gastroesophageal reflux disease)   ? mild  ? Hypertension   ? 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 Personal history of chemotherapy   ? Personal history of radiation therapy 2014  ? bilat lumpectomy  ? ? ?Past Surgical History:  ?Procedure Laterality Date  ? ABDOMINAL AORTOGRAM W/LOWER EXTREMITY N/A 04/21/2018  ? Procedure: ABDOMINAL AORTOGRAM W/LOWER EXTREMITY;  Surgeon: Nelva Bush, MD;  Location: [place] [facility];  Service: Cardiovascular;  Laterality: N/A;  ? ABDOMINAL HYSTERECTOMY  1976  ? for pelvic pain  ? BREAST EXCISIONAL BIOPSY Bilateral 11/2012  ? bilat breast ca chemo and rad  ? BREAST SURGERY Bilateral 2014  ? bilateral lumpectomy and SN biopsy  ? CATARACT EXTRACTION W/PHACO Left 07/30/2020  ? Procedure: CATARACT EXTRACTION PHACO AND INTRAOCULAR LENS PLACEMENT (IOC) LEFT DIABETIC 18.73 01:45.0 17.8%;  Surgeon: Leandrew Koyanagi, MD;  Location: [place];  Service: Ophthalmology;  Laterality: Left;  Diabetic - insulin and opral meds  ? CATARACT EXTRACTION W/PHACO Right 08/13/2020  ? Procedure: CATARACT EXTRACTION PHACO AND INTRAOCULAR LENS PLACEMENT ([place]) RIGHT DIABETIC;   Surgeon: Leandrew Koyanagi, MD;  Location: [place];  Service: Ophthalmology;  Laterality: Right;  10.67 ?1:27.8 ?12.1%  ? CHOLECYSTECTOMY  2007  ? COLONOSCOPY WITH PROPOFOL N/A 10/13/2015  ? Procedure: COLONOSCOPY WITH PROPOFOL;  Surgeon: Manya Silvas, MD;  Location: [facility];  Service: Endoscopy;  Laterality: N/A;  ? LEFT HEART CATH AND CORONARY ANGIOGRAPHY N/A 04/21/2018  ? Procedure: LEFT HEART CATH AND CORONARY ANGIOGRAPHY;  Surgeon: Nelva Bush, MD;  Location: [place] [facility];  Service: Cardiovascular;  Laterality: N/A;  ? OOPHORECTOMY Right   ? PORTACATH PLACEMENT  2014  ? TUMOR REMOVAL Right 1980  ? right foot  ? ? ?Family History  ?Problem Relation Age of Onset  ? Alcohol abuse Mother   ? Heart disease Mother   ? Hypertension Mother   ? Breast cancer Mother 65  ? Alcohol abuse Father   ? Heart disease Father   ? Hypertension Father   ? Heart disease Brother   ? Heart attack Brother   ? ?Social History  ? ?Socioeconomic History  ? Marital status: Widowed  ?  Spouse name: Not on file  ? Number of children: Not on file  ? Years of education: Not on file  ? Highest education level: Not on file  ?Occupational History  ? Not on file  ?Tobacco Use  ? Smoking  status: Former  ?  Packs/day: 0.25  ?  Years: 1.00  ?  Pack years: 0.25  ?  Types: Cigarettes  ?  Quit date: 1963  ?  Years since quitting: 60.2  ? Smokeless tobacco: Never  ?Vaping Use  ? Vaping Use: Never used  ?Substance and Sexual Activity  ? Alcohol use: No  ?  Alcohol/week: 0.0 standard drinks  ? Drug use: No  ? Sexual activity: Never  ?Other Topics Concern  ? Not on file  ?Social History Narrative  ? Lives in [place].   ?   ? 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 Diet - regular  ? Exercise - dance 3x per week  ? ?Social Determinants of Health  ? ?Financial Resource Strain: Low Risk   ? Difficulty of Paying Living Expenses: Not hard at all  ?Food Insecurity: No Food Insecurity  ? Worried About Charity fundraiser in the Last Year:  Never true  ? Ran Out of Food in the Last Year: Never true  ?Transportation Needs: No Transportation Needs  ? Lack of Transportation (Medical): No  ? Lack of Transportation (Non-Medical): No  ?Physical

## 2021-10-06 ENCOUNTER — Telehealth: Payer: Self-pay | Admitting: Family Medicine

## 2021-10-06 DIAGNOSIS — R9389 Abnormal findings on diagnostic imaging of other specified body structures: Secondary | ICD-10-CM

## 2021-10-06 NOTE — Telephone Encounter (Signed)
Patient returned office phone call. She would like a referral to pulmonary. ?

## 2021-10-06 NOTE — Telephone Encounter (Signed)
Please let the patient know I heard back from pulmonology regarding her recent pneumonia and CT findings.  She suggested seeing her in clinic to discuss if anything else needs to be completed.  I can place a referral after you speak with the patient. ?

## 2021-10-06 NOTE — Telephone Encounter (Signed)
Lvm for the patient to call back to see if she would like a referral to pulmonary to discuss her Ct with them.  Ami Thornsberry,cma  ?

## 2021-10-06 NOTE — Telephone Encounter (Signed)
-----   Message from Tyler Pita, MD sent at 10/06/2021 10:01 AM EDT ----- ?Regarding: RE: question about a patient ?Randall Hiss, ?I look at situations like this it is highly suspicious for chronic silent aspiration be due to reflux or some pharyngeal dysphagia that is asymptomatic otherwise.  If she is not symptomatic does not have fever etc. I would not give antibiotics.  We will be happy to see her in consultation. ? ?Elta Guadeloupe. ?----- Message ----- ?From: Leone Haven, MD ?Sent: 10/05/2021   1:40 PM EDT ?To: Tyler Pita, MD ?Subject: question about a patient                      ? ?Hi Dr Patsey Berthold,  ? ?I wanted to see if you could help guide me on the next step for this patient. She was seen in late January for cough and diagnosed with multifocal pneumonia on CXR. She was treated with levaquin and progressively improved back to her baseline. On repeat CXR a month after the first XRAY she had an increased right basilar opacity. She was feeling ok at this point so to evaluate further I ended up getting a CT chest that revealed:  ? ?"There are patchy ground-glass infiltrates in the right middle lobe ?and both lower lobes suggesting multifocal pneumonia. ?? ?There is 2.2 x 0.6 cm linear nodular density in the left lower lobe ?which may be part of atelectasis/pneumonia or underlying neoplastic ?process. Short-term follow-up CT after medical treatment of ?pneumonia should be considered." ? ?She notes she still generally feels well with only a minimal cough that she feels is allergy related. I think she certainly needs further evaluation for the findings on the scan. Given that she is feeling better I am not sure more antibiotics are indicated. Do I just need to refer her over to you for further evaluation? Thanks for your help. ? ?Randall Hiss ? ? ?

## 2021-10-07 NOTE — Telephone Encounter (Signed)
Referral placed.

## 2021-10-07 NOTE — Addendum Note (Signed)
Addended by: Leone Haven on: 10/07/2021 09:58 AM ? ? Modules accepted: Orders ? ?

## 2021-10-13 ENCOUNTER — Encounter: Payer: Self-pay | Admitting: Pulmonary Disease

## 2021-10-13 ENCOUNTER — Ambulatory Visit: Payer: Medicare PPO | Admitting: Pulmonary Disease

## 2021-10-13 VITALS — BP 130/60 | HR 81 | Temp 98.1°F | Ht 62.5 in | Wt 150.2 lb

## 2021-10-13 DIAGNOSIS — D839 Common variable immunodeficiency, unspecified: Secondary | ICD-10-CM | POA: Diagnosis not present

## 2021-10-13 DIAGNOSIS — R9389 Abnormal findings on diagnostic imaging of other specified body structures: Secondary | ICD-10-CM

## 2021-10-13 DIAGNOSIS — T17800S Unspecified foreign body in other parts of respiratory tract causing asphyxiation, sequela: Secondary | ICD-10-CM

## 2021-10-13 NOTE — Patient Instructions (Signed)
Continue follow-up with Dr. Tasia Catchings.  I do think that the IVIG will help you. ? ?We will schedule a follow-up chest CT without contrast in 3 months time with follow-up with me after that.  This is what we consider "short-term" follow-up.  Call sooner if you develop any issues with cough, fevers, chills, sweats or any other respiratory issues of concern. ?

## 2021-10-13 NOTE — Progress Notes (Signed)
Subjective:    Patient ID: Kelsey Mason, female    DOB: [birth year], 81 y.o.   MRN: [id number] Patient Care Team: Glori Luis, MD as PCP - General (Family Medicine) End, Cristal Deer, MD as PCP - Cardiology (Cardiology) Kieth Brightly, MD as Consulting Physician (General Surgery) Tedd Sias Marlana Salvage, MD as Physician Assistant (Endocrinology)  Chief Complaint  Patient presents with   Consult    Abnormal CT scan.    HPI Patient is an 81 year old female former smoker with minimal tobacco exposure in the past and history as noted below who is referred for evaluation of normal CT scan of the chest.  She is kindly referred by Dr. [name].  As noted she has had prior history of cancer both breast.  He is followed by oncology.  She received chemoradiation on both instances.  She has been known to have recurrent sinopulmonary infections.  She has been noted to have mild variable immune deficiency with low IgG.  She is on IVIG infusions.  She notes that these infusions have helped her.  This history is pertinent vis--vis her findings on CT scan of the chest.  The films were reviewed and discussed with the patient.  The films show patchy groundglass infiltrates in the right middle lobe and both lower lobe suggesting multifocal pneumonia.  He had a linear nodular density in the left lower lobe which may be related to atelectasis/pneumonia or scarring.  Patient will need follow-up chest CT.  She has noted a cough that started approximately 3 weeks ago after an acute illness.  She did notice that prior to that illness she had had an episode of aspiration at nighttime.  Cough has been  worse at nighttime.  It has been nonproductive.  No hemoptysis.  She has not had any weight loss or anorexia.  No fevers, chills or sweats since her acute illness several weeks back.  Overall except for her lingering cough she feels that she is doing well.  She even feels that the cough is abating.  It now  does not occur daily.  He has used an albuterol inhaler which has helped her symptoms somewhat.    Review of Systems A 10 point review of systems was performed and it is as noted above otherwise negative.  Past Medical History:  Diagnosis Date   Arthritis    fingers   Breast symptom 2014   Cancer [facility]) 2014   Bilateral Breast, chemo Dr. Koleen Nimrod   Degenerative disc disease, lumbar    Diabetes mellitus    GERD (gastroesophageal reflux disease)    mild   Hypertension    Hypoglobulinemia 09/13/2021   Personal history of chemotherapy    Personal history of radiation therapy 2014   bilat lumpectomy   Past Surgical History:  Procedure Laterality Date   ABDOMINAL AORTOGRAM W/LOWER EXTREMITY N/A 04/21/2018   Procedure: ABDOMINAL AORTOGRAM W/LOWER EXTREMITY;  Surgeon: Yvonne Kendall, MD;  Location: [facility];  Service: Cardiovascular;  Laterality: N/A;   ABDOMINAL HYSTERECTOMY  1976   for pelvic pain   BREAST EXCISIONAL BIOPSY Bilateral 11/2012   bilat breast ca chemo and rad   BREAST SURGERY Bilateral 2014   bilateral lumpectomy and SN biopsy   CATARACT EXTRACTION W/PHACO Left 07/30/2020   Procedure: CATARACT EXTRACTION PHACO AND INTRAOCULAR LENS PLACEMENT ([facility]) LEFT DIABETIC 18.73 01:45.0 17.8%;  Surgeon: Lockie Mola, MD;  Location: [facility];  Service: Ophthalmology;  Laterality: Left;  Diabetic - insulin and opral meds  CATARACT EXTRACTION W/PHACO Right 08/13/2020   Procedure: CATARACT EXTRACTION PHACO AND INTRAOCULAR LENS PLACEMENT ([facility]) RIGHT DIABETIC;  Surgeon: Lockie Mola, MD;  Location: [facility];  Service: Ophthalmology;  Laterality: Right;  10.67 1:27.8 12.1%   CHOLECYSTECTOMY  2007   COLONOSCOPY WITH PROPOFOL N/A 10/13/2015   Procedure: COLONOSCOPY WITH PROPOFOL;  Surgeon: Scot Jun, MD;  Location: [facility] ENDOSCOPY;  Service: Endoscopy;  Laterality: N/A;   LEFT HEART CATH AND CORONARY ANGIOGRAPHY N/A 04/21/2018    Procedure: LEFT HEART CATH AND CORONARY ANGIOGRAPHY;  Surgeon: Yvonne Kendall, MD;  Location: [facility];  Service: Cardiovascular;  Laterality: N/A;   OOPHORECTOMY Right    PORTACATH PLACEMENT  2014   TUMOR REMOVAL Right 1980   right foot   Patient Active Problem List   Diagnosis Date Noted   Hypoglobulinemia 09/13/2021   GERD (gastroesophageal reflux disease) 09/02/2021   Aspiration into lower respiratory tract 08/03/2021   History of airway aspiration 08/03/2021   Hyperlipidemia associated with type 2 diabetes mellitus (HCC) 09/26/2020   Cataracts, bilateral 08/06/2020   Persistent cough 11/12/2019   Right upper quadrant pain 11/06/2019   Balance problem 11/06/2019   Chronic bilateral low back pain without sciatica 04/17/2019   Osteopenia 03/24/2019   Pneumonia 03/09/2019   PAD (peripheral artery disease) (HCC) 05/19/2018   Coronary artery disease involving native coronary artery of native heart without angina pectoris 05/16/2018   Tremor 05/16/2018   Dyspnea on exertion 03/02/2018   Fatigue 03/02/2018   Decreased pedal pulses 01/23/2018   Lipoma of chest wall 12/17/2017   Left knee pain 10/26/2017   Subcutaneous nodule 10/26/2017   Malignant neoplasm of right breast in female, estrogen receptor positive (HCC) 09/12/2017   Pain in both lower extremities 08/31/2017   Renal artery stenosis (HCC) 04/27/2017   NICM (nonischemic cardiomyopathy) (HCC) 01/25/2017   Epigastric abdominal pain 11/17/2016   Non-rheumatic mitral regurgitation 06/10/2016   Left shoulder pain 06/03/2016   Type 2 diabetes mellitus with stage 3 chronic kidney disease, with long-term current use of insulin (HCC) 02/27/2016   Chronic HFrEF (heart failure with reduced ejection fraction) (HCC) 01/30/2016   Chronic kidney disease, stage 3b (HCC) 03/25/2015   Generalized anxiety disorder 06/18/2014   Normocytic anemia 02/07/2014   Mitral valve disease 12/20/2013   Allergic rhinitis 10/17/2013    Neuropathy due to chemotherapeutic drug (HCC) 09/06/2013   Tinnitus 05/24/2013   Insomnia 04/26/2013   History of breast cancer 10/27/2012   Essential hypertension 12/29/2011   Family History  Problem Relation Age of Onset   Alcohol abuse Mother    Heart disease Mother    Hypertension Mother    Breast cancer Mother 90   Alcohol abuse Father    Heart disease Father    Hypertension Father    Heart disease Brother    Heart attack Brother    Social History   Tobacco Use   Smoking status: Former    Packs/day: 0.25    Years: 1.00    Pack years: 0.25    Types: Cigarettes    Quit date: 1963    Years since quitting: 60.3   Smokeless tobacco: Never  Substance Use Topics   Alcohol use: No    Alcohol/week: 0.0 standard drinks   Allergies  Allergen Reactions   Advil [Ibuprofen] Other (See Comments)    Dizziness    Aleve [Naproxen] Other (See Comments)    Dizziness   Fluticasone-Salmeterol Other (See Comments)    dizziness   Lipitor [Atorvastatin]  Other (See Comments)    Legs weak.  Leg pain, muscle ache   Naproxen Sodium Other (See Comments)   Penicillins Hives and Other (See Comments)    Has patient had a PCN reaction causing immediate rash, facial/tongue/throat swelling, SOB or lightheadedness with hypotension: YES Has patient had a PCN reaction causing severe rash involving mucus membranes or skin necrosis: no Has patient had a PCN reaction that required hospitalization: no Has patient had a PCN reaction occurring within the last 10 years: no If all of the above answers are "NO", then may proceed with Cephalosporin use.    Current Meds  Medication Sig   albuterol (VENTOLIN HFA) 108 (90 Base) MCG/ACT inhaler Inhale 2 puffs into the lungs every 6 (six) hours as needed for wheezing or shortness of breath.   aspirin 81 MG tablet Take 81 mg by mouth daily.   blood glucose meter kit and supplies KIT Dispense based on patient and insurance preference. Use up to four times  daily as directed. One Touch Ultra mini meter (FOR ICD-9 250.00, 250.01). Dx: E11.9   carvedilol (COREG) 12.5 MG tablet Take 1 tablet (12.5 mg total) by mouth 2 (two) times daily.   cholecalciferol (VITAMIN D) 25 MCG (1000 UT) tablet Take 1,000 Units by mouth daily.   dapagliflozin propanediol (FARXIGA) 10 MG TABS tablet Take 10 mg by mouth daily.   furosemide (LASIX) 20 MG tablet TAKE 1 TABLET (20 MG TOTAL) BY MOUTH DAILY AS NEEDED (FOR >2LB WEIGHT GAIN OVERNIGHT OR 5 LB WEIGHT GAIN IN 1 WEEK OR SWELLING.).   glucose blood (ONE TOUCH ULTRA TEST) test strip USE TO TEST BLOOD SUGAR UP TO 4 TIMES DAILY   insulin glargine (LANTUS SOLOSTAR) 100 UNIT/ML Solostar Pen Inject 35 Units into the skin daily.   losartan (COZAAR) 100 MG tablet TAKE 1/2 TABLET (50 MG TOTAL) BY MOUTH 2 (TWO) TIMES DAILY.   omeprazole (PRILOSEC) 20 MG capsule Take 1 capsule (20 mg total) by mouth daily.   rosuvastatin (CRESTOR) 20 MG tablet Take 0.5 tablets (10 mg total) by mouth daily.   TRULICITY 1.5 MG/0.5ML SOPN Inject 1.5 mg as directed once a week.   vitamin B-12 (CYANOCOBALAMIN) 1000 MCG tablet Take 1,000 mcg by mouth daily.   Immunization History  Administered Date(s) Administered   Fluad Quad(high Dose 65+) 04/26/2019, 02/02/2021   Influenza Whole 03/26/2012, 04/10/2013   Influenza, High Dose Seasonal PF 06/03/2016, 03/24/2017, 04/11/2018, 03/06/2020   Influenza,inj,Quad PF,6+ Mos 05/15/2014, 03/25/2015   Influenza-Unspecified 05/15/2014, 03/25/2015, 04/26/2019   PFIZER Comirnaty(Gray Top)Covid-19 Tri-Sucrose Vaccine 08/28/2019, 09/18/2019, 04/01/2020, 12/15/2020, 03/30/2021   PFIZER(Purple Top)SARS-COV-2 Vaccination 08/28/2019, 09/18/2019   Pneumococcal Conjugate-13 02/05/2019       Objective:   Physical Exam BP 130/60 (BP Location: Right Arm, Patient Position: Sitting, Cuff Size: Large)   Pulse 81   Temp 98.1 F (36.7 C) (Oral)   Ht 5' 2.5" (1.588 m)   Wt 150 lb 3.2 oz (68.1 kg)   SpO2 96%   BMI 27.03  kg/m  GENERAL: Well-developed, well-nourished elderly woman, no acute distress.  Fully ambulatory. HEAD: Normocephalic, atraumatic.  EYES: Pupils equal, round, reactive to light.  No scleral icterus.  MOUTH: Dentition intact, oral mucosa moist, no thrush.   NECK: Supple. No thyromegaly. Trachea midline. No JVD.  No adenopathy. PULMONARY: Good air entry bilaterally.  No adventitious sounds. CARDIOVASCULAR: S1 and S2. Regular rate and rhythm.  No rubs, murmurs or gallops heard. ABDOMEN: Benign. MUSCULOSKELETAL: No joint deformity, no clubbing, no edema.  NEUROLOGIC:  No overt focal deficit, no gait disturbance, speech is fluent. SKIN: Intact,warm,dry. PSYCH: Mood and behavior normal.         Assessment & Plan:     ICD-10-CM   1. Abnormal CT of the chest  R93.89 CT CHEST WO CONTRAST   We will repeat CT chest in 3 months time Follow-up after CT chest performed    2. CVID (common variable immunodeficiency) (HCC)  D83.9    Continue IVIG infusions Managed by hematology/oncology (Dr. Cathie Hoops)    3. Aspiration into lower respiratory tract, sequela  T17.800S CT CHEST WO CONTRAST   Pattern of multifocal pneumonia on CT consistent with aspiration Currently no need for antibiotics Recommend antireflux measures Continue PPI     Orders Placed This Encounter  Procedures   CT CHEST WO CONTRAST    Standing Status:   Future    Number of Occurrences:   1    Standing Expiration Date:   04/14/2022    Order Specific Question:   Preferred imaging location?    Answer:   [facility]   Appropriate studies have been ordered as above.  Patient has been encouraged to continue her IVIG infusions.  We will see her in follow-up in 3 months time she is to contact us prior to that time should any new difficulties arise.  Gailen Shelter, MD Advanced Bronchoscopy PCCM [facility]    *This note was dictated using voice recognition software/Dragon.  Despite best efforts to  proofread, errors can occur which can change the meaning. Any transcriptional errors that result from this process are unintentional and may not be fully corrected at the time of dictation.

## 2021-10-19 ENCOUNTER — Telehealth: Payer: Self-pay | Admitting: Pulmonary Disease

## 2021-10-19 NOTE — Telephone Encounter (Signed)
Spoke to patient. ?She is wanting to know what CT showed that was ordered by PCP. She is questioning if it still showed PNA. ? ?Dr. Patsey Berthold, please advise. Thanks ?

## 2021-10-19 NOTE — Telephone Encounter (Signed)
Patient is aware of below message and voiced her understanding.  Nothing further needed.   

## 2021-10-19 NOTE — Telephone Encounter (Signed)
As I explained during the visit she had pneumonia on the chest CT.  There was also an area of inflammation and/or pneumonia.  This cannot be further characterized.  We need to do a follow-up CT, this was ordered, to let all of the inflammation from the pneumonia clear.  As I explained to her pneumonia can take sometimes 12 to 14 weeks to clear completely from a CT.  This is the reason why we need a follow-up CT so we can better tell what else is there in the lung.  It may all be related to the pneumonia and I will likely will clear up.  What ever it does not clear up then will determine what would be the best way to approach. ?

## 2021-10-21 ENCOUNTER — Other Ambulatory Visit: Payer: Self-pay | Admitting: Internal Medicine

## 2021-10-26 DIAGNOSIS — N1832 Chronic kidney disease, stage 3b: Secondary | ICD-10-CM | POA: Diagnosis not present

## 2021-10-26 DIAGNOSIS — E1129 Type 2 diabetes mellitus with other diabetic kidney complication: Secondary | ICD-10-CM | POA: Diagnosis not present

## 2021-10-26 DIAGNOSIS — E1122 Type 2 diabetes mellitus with diabetic chronic kidney disease: Secondary | ICD-10-CM | POA: Diagnosis not present

## 2021-10-26 DIAGNOSIS — R809 Proteinuria, unspecified: Secondary | ICD-10-CM | POA: Diagnosis not present

## 2021-10-26 DIAGNOSIS — E1159 Type 2 diabetes mellitus with other circulatory complications: Secondary | ICD-10-CM | POA: Diagnosis not present

## 2021-10-26 DIAGNOSIS — I10 Essential (primary) hypertension: Secondary | ICD-10-CM | POA: Diagnosis not present

## 2021-10-26 DIAGNOSIS — Z794 Long term (current) use of insulin: Secondary | ICD-10-CM | POA: Diagnosis not present

## 2021-11-03 ENCOUNTER — Ambulatory Visit: Payer: Medicare PPO | Admitting: Nurse Practitioner

## 2021-11-03 ENCOUNTER — Encounter: Payer: Self-pay | Admitting: Nurse Practitioner

## 2021-11-03 VITALS — BP 134/78 | HR 71 | Ht 62.5 in | Wt 151.0 lb

## 2021-11-03 DIAGNOSIS — I428 Other cardiomyopathies: Secondary | ICD-10-CM | POA: Diagnosis not present

## 2021-11-03 DIAGNOSIS — I739 Peripheral vascular disease, unspecified: Secondary | ICD-10-CM

## 2021-11-03 DIAGNOSIS — I5022 Chronic systolic (congestive) heart failure: Secondary | ICD-10-CM

## 2021-11-03 DIAGNOSIS — Z794 Long term (current) use of insulin: Secondary | ICD-10-CM

## 2021-11-03 DIAGNOSIS — E785 Hyperlipidemia, unspecified: Secondary | ICD-10-CM

## 2021-11-03 DIAGNOSIS — I2510 Atherosclerotic heart disease of native coronary artery without angina pectoris: Secondary | ICD-10-CM

## 2021-11-03 DIAGNOSIS — E1122 Type 2 diabetes mellitus with diabetic chronic kidney disease: Secondary | ICD-10-CM

## 2021-11-03 DIAGNOSIS — N1832 Chronic kidney disease, stage 3b: Secondary | ICD-10-CM | POA: Diagnosis not present

## 2021-11-03 MED ORDER — EZETIMIBE 10 MG PO TABS: 10.0000 mg | ORAL_TABLET | Freq: Every day | ORAL | 3 refills | Status: DC

## 2021-11-03 NOTE — Progress Notes (Signed)
? ? ?Office Visit  ?  ?Patient Name: Kelsey Mason ?Date of Encounter: 11/03/2021 ? ?Primary Care Provider:  Leone Haven, MD ?Primary Cardiologist:  Nelva Bush, MD ? ?Chief Complaint  ?  ?81 year old female with a history of chronic heart failure with midrange ejection fraction, nonischemic cardiomyopathy, nonobstructive CAD, peripheral arterial disease, hypertension, hyperlipidemia, diabetes, renal artery stenosis, and breast cancer, who presents for follow-up related to nonischemic cardiomyopathy. ? ?Past Medical History  ?  ?Past Medical History:  ?Diagnosis Date  ? Arthritis   ? fingers  ? Breast symptom 2014  ? Cancer [facility]) 2014  ? Bilateral Breast, chemo Dr. Jeb Levering  ? Chronic HFmrEF (heart failure with mid-range ejection fraction) ([place])   ? a. 01/2017 Echo: EF 50-55%; b. 03/2018 Echo: EF 40-45%; c. 01/2020 Echo: EF 40%, glob HK, mild LVH, GrI DD.  ? Degenerative disc disease, lumbar   ? Diabetes mellitus   ? GERD (gastroesophageal reflux disease)   ? mild  ? Hypertension   ? 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 LBBB (left bundle branch block)   ? Mitral regurgitation   ? a. 01/2020 mild-mod MR.  ? NICM (nonischemic cardiomyopathy) ([place])   ? a. 01/2017 Echo: EF 50-55%; b. 03/2018 Echo: EF 40-45%; c. 01/2020 Echo: EF 40%, glob HK, mild LVH, GrI DD, nl RV fxn, mildly dil LA, mild to mod MR.  ? Nonobstructive CAD (coronary artery disease)   ? a. 06/2016 MV: low risk; b. 04/2018 Cath: LM nl, LAD 43m D1/2/3 nl, LCX nl, OM3 55ost Ca2+, RCA 50ost Ca2+.  ? PAD (peripheral artery disease) ([place]   ? a. 04/2018 Lower ext angio: L pop 327mR AT 100, R Peroneal 100d.  ? Personal history of chemotherapy   ? Personal history of radiation therapy 2014  ? bilat lumpectomy  ? ?Past Surgical History:  ?Procedure Laterality Date  ? ABDOMINAL AORTOGRAM W/LOWER EXTREMITY N/A 04/21/2018  ? Procedure: ABDOMINAL AORTOGRAM W/LOWER EXTREMITY;  Surgeon: EnNelva BushMD;  [facility];  Service: Cardiovascular;   Laterality: N/A;  ? ABDOMINAL HYSTERECTOMY  1976  ? for pelvic pain  ? BREAST EXCISIONAL BIOPSY Bilateral 11/2012  ? bilat breast ca chemo and rad  ? BREAST SURGERY Bilateral 2014  ? bilateral lumpectomy and SN biopsy  ? CATARACT EXTRACTION W/PHACO Left 07/30/2020  ? Procedure: CATARACT EXTRACTION PHACO AND INTRAOCULAR LENS PLACEMENT (IOC) LEFT DIABETIC 18.73 01:45.0 17.8%;  Surgeon: BrLeandrew KoyanagiMD;  [facility] Service: Ophthalmology;  Laterality: Left;  Diabetic - insulin and opral meds  ? CATARACT EXTRACTION W/PHACO Right 08/13/2020  ? Procedure: CATARACT EXTRACTION PHACO AND INTRAOCULAR LENS PLACEMENT ([place] DIABETIC;  Surgeon: BrLeandrew KoyanagiMD;  [place]: Ophthalmology;  Laterality: Right;  10.67 ?1:27.8 ?12.1%  ? CHOLECYSTECTOMY  2007  ? COLONOSCOPY WITH PROPOFOL N/A 10/13/2015  ? Procedure: COLONOSCOPY WITH PROPOFOL;  Surgeon: RoManya SilvasMD;  [facility];  Service: Endoscopy;  Laterality: N/A;  ? LEFT HEART CATH AND CORONARY ANGIOGRAPHY N/A 04/21/2018  ? Procedure: LEFT HEART CATH AND CORONARY ANGIOGRAPHY;  Surgeon: EnNelva BushMD;  [facility];  Service: Cardiovascular;  Laterality: N/A;  ? OOPHORECTOMY Right   ? PORTACATH PLACEMENT  2014  ? TUMOR REMOVAL Right 1980  ? right foot  ? ? ?Allergies ? ?Allergies  ?Allergen Reactions  ? Advil [Ibuprofen] Other (See Comments)  ?  Dizziness ?  ? Aleve [Naproxen] Other (See Comments)  ?  Dizziness  ?  Fluticasone-Salmeterol Other (See Comments)  ?  dizziness  ? Lipitor [Atorvastatin] Other (See Comments)  ?  Legs weak.  ?Leg pain, muscle ache  ? Naproxen Sodium Other (See Comments)  ? Penicillins Hives and Other (See Comments)  ?  Has patient had a PCN reaction causing immediate rash, facial/tongue/throat swelling, SOB or lightheadedness with hypotension: YES ?Has patient had a PCN reaction causing severe rash involving mucus membranes or skin necrosis:  no ?Has patient had a PCN reaction that required hospitalization: no ?Has patient had a PCN reaction occurring within the last 10 years: no ?If all of the above answers are "NO", then may proceed with Cephalosporin use. ?  ? ? ?History of Present Illness  ?  ?81 year old female with a history of chronic heart failure with midrange ejection fraction, nonischemic cardiomyopathy, nonobstructive CAD, peripheral arterial disease, hypertension, hyperlipidemia, diabetes, renal artery stenosis, and breast cancer.  Following treatment for breast cancer, she began to experience lower extremity edema.  EF was 50 to 55% in July 2018 however, on follow-up echo in September 2019, EF was further depressed at 40 to 45%.  Diagnostic catheterization October 2019 showed moderate, nonobstructive, calcified coronary artery disease.  Peripheral angiogram was also performed which showed occluded right anterior tibial and distal right peroneal, and three-vessel runoff on the left.  She has since been medically managed.  Most recent echocardiogram in July 2021 showed an EF of 40% with global hypokinesis, mild LVH, grade 1 diastolic dysfunction, normal RV function, and mild to moderate mitral regurgitation. ? ?Ms. Vanpelt was last in cardiology clinic in September 2022, at which time she noted stable bilateral calf tightness when dancing for extended periods.  In discussing this today, she believes those symptoms are predominantly related to statin usage.  She notes that she previously held her rosuvastatin and symptoms resolved completely.  More recently, her rosuvastatin dose was reduced to 10 mg daily however, she continues to experience tightness and myalgias in her lower legs, when active or dancing.  She is interested in switching to Zetia.  With the exception of dealing with pneumonia in February with some residual cough and congestion, she feels as though she has done well from a cardiac standpoint.  She has not been experiencing  dyspnea on exertion and denies chest pain, palpitations, PND, orthopnea, dizziness, syncope, edema, or early satiety. ? ?Home Medications  ?  ?Current Outpatient Medications  ?Medication Sig Dispense Refill  ? albuterol (VENTOLIN HFA) 108 (90 Base) MCG/ACT inhaler Inhale 2 puffs into the lungs every 6 (six) hours as needed for wheezing or shortness of breath. 3.7 g 11  ? aspirin 81 MG tablet Take 81 mg by mouth daily.    ? blood glucose meter kit and supplies KIT Dispense based on patient and insurance preference. Use up to four times daily as directed. One Touch Ultra mini meter (FOR ICD-9 250.00, 250.01). Dx: E11.9 1 each 0  ? carvedilol (COREG) 12.5 MG tablet TAKE 1 TABLET BY MOUTH 2 TIMES DAILY. 180 tablet 0  ? cholecalciferol (VITAMIN D) 25 MCG (1000 UT) tablet Take 1,000 Units by mouth daily.    ? dapagliflozin propanediol (FARXIGA) 10 MG TABS tablet Take 10 mg by mouth daily.    ? furosemide (LASIX) 20 MG tablet TAKE 1 TABLET (20 MG TOTAL) BY MOUTH DAILY AS NEEDED (FOR >2LB WEIGHT GAIN OVERNIGHT OR 5 LB WEIGHT GAIN IN 1 WEEK OR SWELLING.). 90 tablet 3  ? glucose blood (ONE TOUCH ULTRA TEST) test strip USE TO  TEST BLOOD SUGAR UP TO 4 TIMES DAILY 100 each 6  ? insulin glargine (LANTUS SOLOSTAR) 100 UNIT/ML Solostar Pen Inject 35 Units into the skin daily. 15 mL 0  ? losartan (COZAAR) 100 MG tablet TAKE 1/2 TABLET (50 MG TOTAL) BY MOUTH 2 (TWO) TIMES DAILY. 90 tablet 1  ? nitroGLYCERIN (NITROSTAT) 0.4 MG SL tablet PLACE 1 TABLET (0.4 MG TOTAL) UNDER THE TONGUE EVERY 5 (FIVE) MINUTES AS NEEDED FOR CHEST PAIN. MAXIMUM OF 3 DOSES. 25 tablet 3  ? NOVOFINE PEN NEEDLE 32G X 6 MM MISC USE AS DIRECTED WITH LANTUS PEN 100 each 2  ? omeprazole (PRILOSEC) 20 MG capsule Take 1 capsule (20 mg total) by mouth daily. 30 capsule 3  ? rosuvastatin (CRESTOR) 20 MG tablet Take 0.5 tablets (10 mg total) by mouth daily. 45 tablet 3  ? TRULICITY 1.5 JJ/9.4RD SOPN Inject 1.5 mg as directed once a week.  11  ? vitamin B-12  (CYANOCOBALAMIN) 1000 MCG tablet Take 1,000 mcg by mouth daily.    ? ?No current facility-administered medications for this visit.  ?  ? ?Review of Systems  ?  ?Has been having some cough and congestion since pneumo

## 2021-11-03 NOTE — Patient Instructions (Signed)
Medication Instructions:  ?Your physician has recommended you make the following change in your medication:  ? ?STOP Rosuvastatin ?START Ezetimibe 10 mg once daily  ? ?*If you need a refill on your cardiac medications before your next appointment, please call your pharmacy* ? ? ?Lab Work: ?Fasting lipid and liver panel to be done in 8 weeks. Go to [facility] Entrance and check in at registration to have those done. Make sure not to eat or drink anything after midnight before except sip of water with your medications.  ? ? ?If you have labs (blood work) drawn today and your tests are completely normal, you will receive your results only by: ?MyChart Message (if you have MyChart) OR ?A paper copy in the mail ?If you have any lab test that is abnormal or we need to change your treatment, we will call you to review the results. ? ? ?Testing/Procedures: ?None ? ? ?Follow-Up: ?At [facility], you and your health needs are our priority.  As part of our continuing mission to provide you with exceptional heart care, we have created designated Provider Care Teams.  These Care Teams include your primary Cardiologist (physician) and Advanced Practice Providers (APPs -  Physician Assistants and Nurse Practitioners) who all work together to provide you with the care you need, when you need it. ? ? ?Your next appointment:   ?6 month(s) ? ?The format for your next appointment:   ?In Person ? ?Provider:   ?Nelva Bush, MD  ? ? ? ? ? ?Important Information About Sugar ? ? ? ? ?  ?

## 2021-11-04 ENCOUNTER — Other Ambulatory Visit: Payer: Self-pay | Admitting: Internal Medicine

## 2021-11-04 ENCOUNTER — Telehealth: Payer: Self-pay | Admitting: Nurse Practitioner

## 2021-11-04 DIAGNOSIS — E785 Hyperlipidemia, unspecified: Secondary | ICD-10-CM

## 2021-11-04 NOTE — Telephone Encounter (Signed)
Pt needs lab orders placed for requested labs. Kelsey Mason requested on 05/2 appt  ?

## 2021-11-04 NOTE — Telephone Encounter (Signed)
I called and spoke with the patient. ?She was calling to discuss her lab work that Ignacia Bayley, NP ordered yesterday at her office visit.  ? ?I advised the patient that she will need to have a FASTING lipid/ liver panel in 8 weeks (around early July), at the [facility]. ?She advised she has a CT scan on 01/07/22 at [facility]- I advised her she can have her labs done the same day since she will be over this way. ? ?The patient voices understanding and is agreeable. ? ?She then advised if she needed to cancel lab work on 05/04/22. ?I advised I do not see any pending lab work for her on 05/04/22. ?She will follow up with Dr. Saunders Revel on 05/05/22. ? ?The patient is aware her lab work can be done on a walk in basis. ? ?Medical Mall Entrance at [facility] ?1st desk on the right to check in (REGISTRATION) ?Lab hours: Monday- Friday (7:30 am- 5:30 pm) ? ?Lab orders placed for a lipid/ liver panel for 01/07/22 at the [facility].  ? ? ? ?

## 2021-11-06 ENCOUNTER — Inpatient Hospital Stay: Payer: Medicare PPO | Attending: Oncology

## 2021-11-06 DIAGNOSIS — M810 Age-related osteoporosis without current pathological fracture: Secondary | ICD-10-CM | POA: Diagnosis not present

## 2021-11-06 DIAGNOSIS — N184 Chronic kidney disease, stage 4 (severe): Secondary | ICD-10-CM | POA: Insufficient documentation

## 2021-11-06 DIAGNOSIS — I130 Hypertensive heart and chronic kidney disease with heart failure and stage 1 through stage 4 chronic kidney disease, or unspecified chronic kidney disease: Secondary | ICD-10-CM | POA: Diagnosis not present

## 2021-11-06 DIAGNOSIS — Z853 Personal history of malignant neoplasm of breast: Secondary | ICD-10-CM | POA: Diagnosis not present

## 2021-11-06 DIAGNOSIS — Z87891 Personal history of nicotine dependence: Secondary | ICD-10-CM | POA: Diagnosis not present

## 2021-11-06 DIAGNOSIS — I509 Heart failure, unspecified: Secondary | ICD-10-CM | POA: Insufficient documentation

## 2021-11-06 DIAGNOSIS — I2510 Atherosclerotic heart disease of native coronary artery without angina pectoris: Secondary | ICD-10-CM | POA: Insufficient documentation

## 2021-11-06 DIAGNOSIS — Z803 Family history of malignant neoplasm of breast: Secondary | ICD-10-CM | POA: Diagnosis not present

## 2021-11-06 DIAGNOSIS — Z90710 Acquired absence of both cervix and uterus: Secondary | ICD-10-CM | POA: Insufficient documentation

## 2021-11-06 LAB — CBC WITH DIFFERENTIAL/PLATELET
Abs Immature Granulocytes: 0.04 10*3/uL (ref 0.00–0.07)
Basophils Absolute: 0.1 10*3/uL (ref 0.0–0.1)
Basophils Relative: 1 %
Eosinophils Absolute: 0.7 10*3/uL — ABNORMAL HIGH (ref 0.0–0.5)
Eosinophils Relative: 7 %
HCT: 36.9 % (ref 36.0–46.0)
Hemoglobin: 12.6 g/dL (ref 12.0–15.0)
Immature Granulocytes: 0 %
Lymphocytes Relative: 18 %
Lymphs Abs: 1.7 10*3/uL (ref 0.7–4.0)
MCH: 30.3 pg (ref 26.0–34.0)
MCHC: 34.1 g/dL (ref 30.0–36.0)
MCV: 88.7 fL (ref 80.0–100.0)
Monocytes Absolute: 0.6 10*3/uL (ref 0.1–1.0)
Monocytes Relative: 6 %
Neutro Abs: 6.4 10*3/uL (ref 1.7–7.7)
Neutrophils Relative %: 68 %
Platelets: 173 10*3/uL (ref 150–400)
RBC: 4.16 MIL/uL (ref 3.87–5.11)
RDW: 12.7 % (ref 11.5–15.5)
WBC: 9.4 10*3/uL (ref 4.0–10.5)
nRBC: 0 % (ref 0.0–0.2)

## 2021-11-06 LAB — COMPREHENSIVE METABOLIC PANEL
ALT: 15 U/L (ref 0–44)
AST: 17 U/L (ref 15–41)
Albumin: 4 g/dL (ref 3.5–5.0)
Alkaline Phosphatase: 67 U/L (ref 38–126)
Anion gap: 6 (ref 5–15)
BUN: 19 mg/dL (ref 8–23)
CO2: 26 mmol/L (ref 22–32)
Calcium: 8.7 mg/dL — ABNORMAL LOW (ref 8.9–10.3)
Chloride: 98 mmol/L (ref 98–111)
Creatinine, Ser: 1.32 mg/dL — ABNORMAL HIGH (ref 0.44–1.00)
GFR, Estimated: 41 mL/min — ABNORMAL LOW (ref >60)
Glucose, Bld: 120 mg/dL — ABNORMAL HIGH (ref 70–99)
Potassium: 4 mmol/L (ref 3.5–5.1)
Sodium: 130 mmol/L — ABNORMAL LOW (ref 135–145)
Total Bilirubin: 0.8 mg/dL (ref 0.3–1.2)
Total Protein: 7.1 g/dL (ref 6.5–8.1)

## 2021-11-09 LAB — KAPPA/LAMBDA LIGHT CHAINS
Kappa free light chain: 32.4 mg/L — ABNORMAL HIGH (ref 3.3–19.4)
Kappa, lambda light chain ratio: 1.29 (ref 0.26–1.65)
Lambda free light chains: 25.2 mg/L (ref 5.7–26.3)

## 2021-11-10 LAB — MULTIPLE MYELOMA PANEL, SERUM
Albumin SerPl Elph-Mcnc: 3.9 g/dL (ref 2.9–4.4)
Albumin/Glob SerPl: 1.7 (ref 0.7–1.7)
Alpha 1: 0.2 g/dL (ref 0.0–0.4)
Alpha2 Glob SerPl Elph-Mcnc: 0.7 g/dL (ref 0.4–1.0)
B-Globulin SerPl Elph-Mcnc: 0.9 g/dL (ref 0.7–1.3)
Gamma Glob SerPl Elph-Mcnc: 0.6 g/dL (ref 0.4–1.8)
Globulin, Total: 2.4 g/dL (ref 2.2–3.9)
IgA: 125 mg/dL (ref 64–422)
IgG (Immunoglobin G), Serum: 650 mg/dL (ref 586–1602)
IgM (Immunoglobulin M), Srm: 42 mg/dL (ref 26–217)
Total Protein ELP: 6.3 g/dL (ref 6.0–8.5)

## 2021-11-13 ENCOUNTER — Other Ambulatory Visit: Payer: Self-pay | Admitting: Oncology

## 2021-11-13 ENCOUNTER — Inpatient Hospital Stay: Payer: Medicare PPO | Admitting: Oncology

## 2021-11-13 ENCOUNTER — Other Ambulatory Visit: Payer: Medicare PPO

## 2021-11-13 ENCOUNTER — Encounter: Payer: Self-pay | Admitting: Oncology

## 2021-11-13 ENCOUNTER — Inpatient Hospital Stay: Payer: Medicare PPO

## 2021-11-13 VITALS — BP 180/65 | HR 64 | Temp 97.2°F | Ht 60.25 in | Wt 153.0 lb

## 2021-11-13 DIAGNOSIS — Z803 Family history of malignant neoplasm of breast: Secondary | ICD-10-CM | POA: Diagnosis not present

## 2021-11-13 DIAGNOSIS — I2510 Atherosclerotic heart disease of native coronary artery without angina pectoris: Secondary | ICD-10-CM | POA: Diagnosis not present

## 2021-11-13 DIAGNOSIS — M810 Age-related osteoporosis without current pathological fracture: Secondary | ICD-10-CM

## 2021-11-13 DIAGNOSIS — Z87891 Personal history of nicotine dependence: Secondary | ICD-10-CM | POA: Diagnosis not present

## 2021-11-13 DIAGNOSIS — Z853 Personal history of malignant neoplasm of breast: Secondary | ICD-10-CM | POA: Diagnosis not present

## 2021-11-13 DIAGNOSIS — R771 Abnormality of globulin: Secondary | ICD-10-CM | POA: Diagnosis not present

## 2021-11-13 DIAGNOSIS — Z90710 Acquired absence of both cervix and uterus: Secondary | ICD-10-CM | POA: Diagnosis not present

## 2021-11-13 DIAGNOSIS — I509 Heart failure, unspecified: Secondary | ICD-10-CM | POA: Diagnosis not present

## 2021-11-13 DIAGNOSIS — N184 Chronic kidney disease, stage 4 (severe): Secondary | ICD-10-CM | POA: Diagnosis not present

## 2021-11-13 DIAGNOSIS — I130 Hypertensive heart and chronic kidney disease with heart failure and stage 1 through stage 4 chronic kidney disease, or unspecified chronic kidney disease: Secondary | ICD-10-CM | POA: Diagnosis not present

## 2021-11-13 NOTE — Progress Notes (Signed)
[place] ? ?Clinic day:  11/13/2021 ? ?Chief Complaint: Kelsey Mason is a 81 y.o. female with bilateral stage IA breast cancer presents for follow-up ?[name] ?PERTINENT ONCOLOGY HISTORY ?Kelsey Mason is a 81 y.o.afemale who has above oncology history reviewed by me today presented for follow up visit for management of history of bilateral breast cancer. ?Patient previously follows up with Dr. Mike Gip. Switched care to me on 09/18/2018. ?Medical record review was performed by me. ? ?bilateral breast cancer s/p lumpectomy and sentinel lymph node biopsy on 11/17/2012. ? ?Right breast revealed a 5 mm grade I invasive carcinoma.  There was no DCIS.  Two sentinel lymph nodes were negative.  Pathologic stage was T1aN0M0.  Tumor was ER + (90%), PR + (20%), and Her2/neu -.  ? ?Left breast revealed a 1.8 cm grade III invasive ductal carcinoma.  There was lymphovascular invasion.  There was no DCIS.  One sentinel lymph node was negative.  Pathologic stage was T1cN0M0.  Tumor was ER + (>90%), PR + (1-5%), and Her2/neu -. ? ?Oncotype DX testing on the left breast mass revealed a recurrence score of 28 which corresponded to a 10 year risk of distant recurrence of 19% (CI 15-23%) with tamoxifen alone. ? ?BCI testing on 10/04/2017 revealed a high risk of late recurrence (5.4%; CI 2.5%-8.2%) during years 5-10.  There was a low likelihood of benefit from extended endocrine therapy. ? ?She received Adriamycin and Cytoxan (AC) x 4 cycles followed by weekly Taxol x 10 (completed 06/05/2013).  She did not receive 2 cycles of Taxol secondary to progressive neuropathy.  She received bilateral breast radiation (07/2013 - 09/2013).  She began letrozole (Femara) in 09/2013. ? ?CA27.29 has been followed:  24.4 on 09/10/2016, 28.1 on 03/14/2017, 24.9 on 09/12/2017, and 22.8 on 03/13/2018. ?She has a history of a borderline mild normocytic anemia.  Diet appears modest.  She denies any melena,  hematochezia, hematuria or vaginal bleeding. Colonoscopy on 10/13/2015 was negative.   ? ?Soft tissue neck ultrasound on 03/16/2017 revealed no fluid collection or soft tissue lesion. ? ?09/2013- 12/2019 Letrozole, which was discontinued due to worsening of osteoporosis and  ?Her BCI was obtained previously 10/04/2017 revealed a high risk of late recurrence (5.4%; CI 2.5%-8.2%) during years 5-10.  There was a low likelihood of benefit from extended endocrine therapy ?bone density done on 12/20/2019 ?Her T score was -2.3 in 2019 now left femoral neck T score at -2.5. ?She has progressed to osteoporosis state. ?I have previously discussed with patient about dental clearance and bisphosphonate use.  At that time, patient decided not to proceed with intervention due to COVID-19 pandemic ?  ?Per Dr. Davonna Belling, patient has had recurrent sinopulmonary infections.  ?09/02/2021 immunoglobulin level has been checked recently which showed decreased IgG level 488.  ?09/27/2021, CT chest without contrast showed patchy groundglass infiltration in the right middle lobe and both lower lobes suggesting multifocal pneumonia and 2.2 x 0.6 cm linear nodular density in the left lower lobe which may be part of atelectasis/pneumonia underlying neoplastic process.  Recommend short-term CT follow-up.  Extensive coronary artery calcification. ? ?Patient reports mild occasional cough which she feels is allergy related.  She also has some runny nose.  Patient will see pulmonology Dr. Patsey Berthold for evaluation. ? ? ? ?INTERVAL HISTORY ?Kelsey Mason is a 81 y.o. female who has above history reviewed by me today presents for follow up visit for history of breast cancer, and hypoglobulinemia.  ?10/05/2021  IVIG 25g x 1.  She tolerated well ?She is not interested to get additional IVIG doses at this point due to financial reasons. ?Patient was seen by pulmonology and was recommended to have follow-up CT done. ?Today she has no new complaints.   She reports to have a little bit of sinus congestion. ?Review of Systems  ?Constitutional:  Negative for appetite change, chills, fatigue and fever.  ?HENT:   Negative for hearing loss and voice change.   ?Eyes:  Negative for eye problems.  ?Respiratory:  Negative for chest tightness and cough.   ?Cardiovascular:  Negative for chest pain.  ?Gastrointestinal:  Negative for abdominal distention, abdominal pain, blood in stool and nausea.  ?Endocrine: Negative for hot flashes.  ?Genitourinary:  Negative for difficulty urinating and frequency.   ?Musculoskeletal:  Positive for arthralgias.  ?Skin:  Negative for itching and rash.  ?Neurological:  Negative for extremity weakness and headaches.  ?Hematological:  Negative for adenopathy.  ?Psychiatric/Behavioral:  Negative for confusion.   ? ? ?Past Medical History:  ?Diagnosis Date  ? Arthritis   ? fingers  ? Breast symptom 2014  ? Cancer [facility]) 2014  ? Bilateral Breast, chemo Dr. Jeb Levering  ? Chronic HFmrEF (heart failure with mid-range ejection fraction) ([place])   ? a. 01/2017 Echo: EF 50-55%; b. 03/2018 Echo: EF 40-45%; c. 01/2020 Echo: EF 40%, glob HK, mild LVH, GrI DD.  ? Degenerative disc disease, lumbar   ? Diabetes mellitus   ? GERD (gastroesophageal reflux disease)   ? mild  ? Hypertension   ? 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 LBBB (left bundle branch block)   ? Mitral regurgitation   ? a. 01/2020 mild-mod MR.  ? NICM (nonischemic cardiomyopathy) ([place])   ? a. 01/2017 Echo: EF 50-55%; b. 03/2018 Echo: EF 40-45%; c. 01/2020 Echo: EF 40%, glob HK, mild LVH, GrI DD, nl RV fxn, mildly dil LA, mild to mod MR.  ? Nonobstructive CAD (coronary artery disease)   ? a. 06/2016 MV: low risk; b. 04/2018 Cath: LM nl, LAD 62m D1/2/3 nl, LCX nl, OM3 55ost Ca2+, RCA 50ost Ca2+.  ? PAD (peripheral artery disease) ([place]   ? a. 04/2018 Lower ext angio: L pop 352mR AT 100, R Peroneal 100d.  ? Personal history of chemotherapy   ? Personal history of radiation therapy 2014  ? bilat lumpectomy   ? ? ?Past Surgical History:  ?Procedure Laterality Date  ? ABDOMINAL AORTOGRAM W/LOWER EXTREMITY N/A 04/21/2018  ? Procedure: ABDOMINAL AORTOGRAM W/LOWER EXTREMITY;  Surgeon: EnNelva BushMD;  [facility];  Service: Cardiovascular;  Laterality: N/A;  ? ABDOMINAL HYSTERECTOMY  1976  ? for pelvic pain  ? BREAST EXCISIONAL BIOPSY Bilateral 11/2012  ? bilat breast ca chemo and rad  ? BREAST SURGERY Bilateral 2014  ? bilateral lumpectomy and SN biopsy  ? CATARACT EXTRACTION W/PHACO Left 07/30/2020  ? Procedure: CATARACT EXTRACTION PHACO AND INTRAOCULAR LENS PLACEMENT (IOC) LEFT DIABETIC 18.73 01:45.0 17.8%;  Surgeon: BrLeandrew KoyanagiMD;  [place] Service: Ophthalmology;  Laterality: Left;  Diabetic - insulin and opral meds  ? CATARACT EXTRACTION W/PHACO Right 08/13/2020  ? Procedure: CATARACT EXTRACTION PHACO AND INTRAOCULAR LENS PLACEMENT ([place];  Surgeon: BrLeandrew KoyanagiMD;  [place] [facility] Service: Ophthalmology;  Laterality: Right;  10.67 ?1:27.8 ?12.1%  ? CHOLECYSTECTOMY  2007  ? COLONOSCOPY WITH PROPOFOL N/A 10/13/2015  ? Procedure: COLONOSCOPY WITH PROPOFOL;  Surgeon: RoManya SilvasMD;  [facility];  Service: Endoscopy;  Laterality: N/A;  ? LEFT HEART CATH AND CORONARY ANGIOGRAPHY N/A 04/21/2018  ? Procedure: LEFT HEART CATH AND CORONARY ANGIOGRAPHY;  Surgeon: Nelva Bush, MD;  Location: [place] LAB;  Service: Cardiovascular;  Laterality: N/A;  ? OOPHORECTOMY Right   ? PORTACATH PLACEMENT  2014  ? TUMOR REMOVAL Right 1980  ? right foot  ? ? ?Family History  ?Problem Relation Age of Onset  ? Alcohol abuse Mother   ? Heart disease Mother   ? Hypertension Mother   ? Breast cancer Mother 49  ? Alcohol abuse Father   ? Heart disease Father   ? Hypertension Father   ? Heart disease Brother   ? Heart attack Brother   ? ?Social History  ? ?Socioeconomic History  ? Marital status: Widowed  ?  Spouse name: Not  on file  ? Number of children: Not on file  ? Years of education: Not on file  ? Highest education level: Not on file  ?Occupational History  ? Not on file  ?Tobacco Use  ? Smoking status: Former  ?  Packs/da

## 2021-11-19 DIAGNOSIS — E1136 Type 2 diabetes mellitus with diabetic cataract: Secondary | ICD-10-CM | POA: Diagnosis not present

## 2021-11-19 DIAGNOSIS — E261 Secondary hyperaldosteronism: Secondary | ICD-10-CM | POA: Diagnosis not present

## 2021-11-19 DIAGNOSIS — E1159 Type 2 diabetes mellitus with other circulatory complications: Secondary | ICD-10-CM | POA: Diagnosis not present

## 2021-11-19 DIAGNOSIS — E1151 Type 2 diabetes mellitus with diabetic peripheral angiopathy without gangrene: Secondary | ICD-10-CM | POA: Diagnosis not present

## 2021-11-19 DIAGNOSIS — I509 Heart failure, unspecified: Secondary | ICD-10-CM | POA: Diagnosis not present

## 2021-11-19 DIAGNOSIS — I25119 Atherosclerotic heart disease of native coronary artery with unspecified angina pectoris: Secondary | ICD-10-CM | POA: Diagnosis not present

## 2021-11-19 DIAGNOSIS — J449 Chronic obstructive pulmonary disease, unspecified: Secondary | ICD-10-CM | POA: Diagnosis not present

## 2021-11-19 DIAGNOSIS — I110 Hypertensive heart disease with heart failure: Secondary | ICD-10-CM | POA: Diagnosis not present

## 2021-11-19 DIAGNOSIS — Z794 Long term (current) use of insulin: Secondary | ICD-10-CM | POA: Diagnosis not present

## 2021-11-30 ENCOUNTER — Other Ambulatory Visit: Payer: Self-pay | Admitting: Family Medicine

## 2021-11-30 DIAGNOSIS — K219 Gastro-esophageal reflux disease without esophagitis: Secondary | ICD-10-CM

## 2021-12-04 ENCOUNTER — Ambulatory Visit: Payer: Medicare PPO | Admitting: Family Medicine

## 2021-12-04 ENCOUNTER — Encounter: Payer: Self-pay | Admitting: Family Medicine

## 2021-12-04 VITALS — BP 130/80 | HR 87 | Temp 98.6°F | Ht 60.0 in | Wt 154.2 lb

## 2021-12-04 DIAGNOSIS — I10 Essential (primary) hypertension: Secondary | ICD-10-CM

## 2021-12-04 DIAGNOSIS — Z794 Long term (current) use of insulin: Secondary | ICD-10-CM

## 2021-12-04 DIAGNOSIS — N1832 Chronic kidney disease, stage 3b: Secondary | ICD-10-CM

## 2021-12-04 DIAGNOSIS — B372 Candidiasis of skin and nail: Secondary | ICD-10-CM | POA: Diagnosis not present

## 2021-12-04 DIAGNOSIS — E1122 Type 2 diabetes mellitus with diabetic chronic kidney disease: Secondary | ICD-10-CM | POA: Diagnosis not present

## 2021-12-04 LAB — BASIC METABOLIC PANEL
BUN: 21 mg/dL (ref 6–23)
CO2: 28 mEq/L (ref 19–32)
Calcium: 9.5 mg/dL (ref 8.4–10.5)
Chloride: 103 mEq/L (ref 96–112)
Creatinine, Ser: 1.55 mg/dL — ABNORMAL HIGH (ref 0.40–1.20)
GFR: 31.4 mL/min — ABNORMAL LOW (ref >60.00)
Glucose, Bld: 89 mg/dL (ref 70–99)
Potassium: 4.6 mEq/L (ref 3.5–5.1)
Sodium: 136 mEq/L (ref 135–145)

## 2021-12-04 LAB — POCT GLYCOSYLATED HEMOGLOBIN (HGB A1C): Hemoglobin A1C: 6.7 % — AB (ref 4.0–5.6)

## 2021-12-04 MED ORDER — NYSTATIN 100000 UNIT/GM EX CREA: 1.0000 "application " | TOPICAL_CREAM | Freq: Two times a day (BID) | CUTANEOUS | 0 refills | Status: DC

## 2021-12-04 MED ORDER — FLUCONAZOLE 150 MG PO TABS: 150.0000 mg | ORAL_TABLET | ORAL | 0 refills | Status: AC

## 2021-12-04 NOTE — Assessment & Plan Note (Addendum)
Patient has apparent significant candidal intertrigo infection.  We will treat with topical nystatin.  Also treat with Diflucan 150 mg once every 3 days for 2 doses.  If not improving she will let us know.  Discussed this is not related to her reflux medicine or her statin and that she will restart those.  Discussed that the [place] could be contributing to this.  If this issue does not improve with the above treatment I advised that we will need to discontinue the [place].

## 2021-12-04 NOTE — Assessment & Plan Note (Signed)
Well-controlled.  She will continue Lantus 35 units daily, Farxiga 10 mg daily, and Trulicity 1.5 mg weekly.

## 2021-12-04 NOTE — Progress Notes (Signed)
Kelsey Mason °

## 2021-12-04 NOTE — Progress Notes (Signed)
[name], MD Phone: [phone]  Kelsey Mason is a 81 y.o. female who presents today for f/u.  HYPERTENSION Disease Monitoring Home BP Monitoring 130/60 on most recent home check Chest pain- no    Dyspnea- no Medications Compliance-  taking losartan, coreg  Edema- no BMET    Component Value Date/Time   NA 130 (L) 11/06/2021 1120   NA 137 02/19/2019 0000   NA 135 (L) 01/07/2014 1000   K 4.0 11/06/2021 1120   K 4.7 01/07/2014 1000   CL 98 11/06/2021 1120   CL 98 01/07/2014 1000   CO2 26 11/06/2021 1120   CO2 30 01/07/2014 1000   GLUCOSE 120 (H) 11/06/2021 1120   GLUCOSE 268 (H) 01/07/2014 1000   BUN 19 11/06/2021 1120   BUN 19 02/19/2019 0000   BUN 19 (H) 01/07/2014 1000   CREATININE 1.32 (H) 11/06/2021 1120   CREATININE 1.24 01/07/2014 1000   CALCIUM 8.7 (L) 11/06/2021 1120   CALCIUM 10.0 01/07/2014 1000   GFRNONAA 41 (L) 11/06/2021 1120   GFRNONAA 43 (L) 01/07/2014 1000   GFRAA 30 (L) 12/24/2019 0903   GFRAA 50 (L) 01/07/2014 1000   DIABETES Disease Monitoring: Blood Sugar ranges-96 or lower fasting Polyuria/phagia/dipsia- no      Optho- UTD Medications: Compliance- taking lantus, farxiga, trulicity Hypoglycemic symptoms- no  Rash: Patient notes rash on her external vagina for the last year or so.  Notes it does itch.  She has no vaginal discharge or vaginal itching with this.  She has been working on this on her own at home.  Over the last week she has had similar rash in her inguinal folds as well as along her thighs.  She wondered if it was related to her reflux medicine or her statin and thus she stopped those 5 days ago.   Social History   Tobacco Use  Smoking Status Former   Packs/day: 0.25   Years: 1.00   Pack years: 0.25   Types: Cigarettes   Quit date: 1963   Years since quitting: 60.4  Smokeless Tobacco Never    Current Outpatient Medications on File Prior to Visit  Medication Sig Dispense Refill   albuterol (VENTOLIN HFA) 108  (90 Base) MCG/ACT inhaler TAKE 2 PUFFS BY MOUTH EVERY 6 HOURS AS NEEDED FOR WHEEZE OR SHORTNESS OF BREATH 18 each 11   aspirin 81 MG tablet Take 81 mg by mouth daily.     blood glucose meter kit and supplies KIT Dispense based on patient and insurance preference. Use up to four times daily as directed. One Touch Ultra mini meter (FOR ICD-9 250.00, 250.01). Dx: E11.9 1 each 0   carvedilol (COREG) 12.5 MG tablet TAKE 1 TABLET BY MOUTH 2 TIMES DAILY. 180 tablet 0   cholecalciferol (VITAMIN D) 25 MCG (1000 UT) tablet Take 1,000 Units by mouth daily.     dapagliflozin propanediol (FARXIGA) 10 MG TABS tablet Take 10 mg by mouth daily.     ezetimibe (ZETIA) 10 MG tablet Take 1 tablet (10 mg total) by mouth daily. 90 tablet 3   furosemide (LASIX) 20 MG tablet TAKE 1 TABLET (20 MG TOTAL) BY MOUTH DAILY AS NEEDED (FOR >2LB WEIGHT GAIN OVERNIGHT OR 5 LB WEIGHT GAIN IN 1 WEEK OR SWELLING.). 90 tablet 3   glucose blood (ONE TOUCH ULTRA TEST) test strip USE TO TEST BLOOD SUGAR UP TO 4 TIMES DAILY 100 each 6   insulin glargine (LANTUS SOLOSTAR) 100 UNIT/ML Solostar Pen Inject 35 Units into  the skin daily. 15 mL 0   losartan (COZAAR) 100 MG tablet TAKE 1/2 TABLET (50 MG TOTAL) BY MOUTH 2 (TWO) TIMES DAILY. 90 tablet 1   nitroGLYCERIN (NITROSTAT) 0.4 MG SL tablet PLACE 1 TABLET (0.4 MG TOTAL) UNDER THE TONGUE EVERY 5 (FIVE) MINUTES AS NEEDED FOR CHEST PAIN. MAXIMUM OF 3 DOSES. 25 tablet 3   NOVOFINE PEN NEEDLE 32G X 6 MM MISC USE AS DIRECTED WITH LANTUS PEN 100 each 2   omeprazole (PRILOSEC) 20 MG capsule TAKE 1 CAPSULE BY MOUTH EVERY DAY 90 capsule 1   rosuvastatin (CRESTOR) 20 MG tablet Take 0.5 tablets (10 mg total) by mouth daily. 45 tablet 3   TRULICITY 1.5 AO/1.3YQ SOPN Inject 1.5 mg as directed once a week.  11   vitamin B-12 (CYANOCOBALAMIN) 1000 MCG tablet Take 1,000 mcg by mouth daily.     No current facility-administered medications on file prior to visit.     ROS see history of present  illness  Objective  Physical Exam Vitals:   12/04/21 0758  BP: 130/80  Pulse: 87  Temp: 98.6 F (37 C)  SpO2: 97%    BP Readings from Last 3 Encounters:  12/04/21 130/80  11/13/21 (!) 180/65  11/03/21 134/78   Wt Readings from Last 3 Encounters:  12/04/21 154 lb 3.2 oz (69.9 kg)  11/13/21 153 lb (69.4 kg)  11/03/21 151 lb (68.5 kg)    Physical Exam Constitutional:      General: She is not in acute distress.    Appearance: She is not diaphoretic.  Cardiovascular:     Rate and Rhythm: Normal rate and regular rhythm.     Heart sounds: Normal heart sounds.  Pulmonary:     Effort: Pulmonary effort is normal.     Breath sounds: Normal breath sounds.  Genitourinary:    Comments: Fulton Mole, CMA served as chaperone, extensive candidal intertrigo like rash with satellite lesions in her inguinal folds, on her inner upper thighs, and over her external vaginal area Skin:    General: Skin is warm and dry.  Neurological:     Mental Status: She is alert.     Assessment/Plan: Please see individual problem list.  Problem List Items Addressed This Visit     Candidal intertrigo (Chronic)    Patient has apparent significant candidal intertrigo infection.  We will treat with topical nystatin.  Also treat with Diflucan 150 mg once every 3 days for 2 doses.  If not improving she will let us know.  Discussed this is not related to her reflux medicine or her statin and that she will restart those.  Discussed that the [place] could be contributing to this.  If this issue does not improve with the above treatment I advised that we will need to discontinue the [place].       Relevant Medications   fluconazole (DIFLUCAN) 150 MG tablet   nystatin cream (MYCOSTATIN)   Essential hypertension (Chronic)    Well-controlled.  She will continue losartan 100 mg daily and carvedilol 12.5 mg twice daily.  We will check a BMP given recent low sodium.       Relevant Orders   Basic Metabolic  Panel (BMET)   Type 2 diabetes mellitus with stage 3 chronic kidney disease, with long-term current use of insulin (HCC) - Primary (Chronic)    Well-controlled.  She will continue Lantus 35 units daily, Farxiga 10 mg daily, and Trulicity 1.5 mg weekly.       Relevant Orders  POCT HgB A1C (Completed)    Return in about 3 months (around 03/06/2022).   [name], MD [place]

## 2021-12-04 NOTE — Assessment & Plan Note (Signed)
Well-controlled.  She will continue losartan 100 mg daily and carvedilol 12.5 mg twice daily.  We will check a BMP given recent low sodium.

## 2021-12-04 NOTE — Patient Instructions (Signed)
Nice to see you. Please let me know if your rash is not improving over the next week or 2.

## 2021-12-07 DIAGNOSIS — I10 Essential (primary) hypertension: Secondary | ICD-10-CM | POA: Diagnosis not present

## 2021-12-07 DIAGNOSIS — R600 Localized edema: Secondary | ICD-10-CM | POA: Diagnosis not present

## 2021-12-07 DIAGNOSIS — N1832 Chronic kidney disease, stage 3b: Secondary | ICD-10-CM | POA: Diagnosis not present

## 2021-12-07 DIAGNOSIS — E1129 Type 2 diabetes mellitus with other diabetic kidney complication: Secondary | ICD-10-CM | POA: Diagnosis not present

## 2021-12-10 DIAGNOSIS — E113211 Type 2 diabetes mellitus with mild nonproliferative diabetic retinopathy with macular edema, right eye: Secondary | ICD-10-CM | POA: Diagnosis not present

## 2021-12-10 DIAGNOSIS — Z961 Presence of intraocular lens: Secondary | ICD-10-CM | POA: Diagnosis not present

## 2021-12-10 DIAGNOSIS — H353132 Nonexudative age-related macular degeneration, bilateral, intermediate dry stage: Secondary | ICD-10-CM | POA: Diagnosis not present

## 2021-12-14 ENCOUNTER — Telehealth: Payer: Self-pay | Admitting: Family Medicine

## 2021-12-14 NOTE — Telephone Encounter (Signed)
Pt called in requesting refill on medication (nystatin cream (MYCOSTATIN))... Pt requesting callback.Marland KitchenMarland Kitchen

## 2021-12-15 ENCOUNTER — Other Ambulatory Visit: Payer: Self-pay

## 2021-12-15 DIAGNOSIS — B372 Candidiasis of skin and nail: Secondary | ICD-10-CM

## 2021-12-15 MED ORDER — NYSTATIN 100000 UNIT/GM EX CREA: 1.0000 "application " | TOPICAL_CREAM | Freq: Two times a day (BID) | CUTANEOUS | 0 refills | Status: DC

## 2021-12-15 NOTE — Telephone Encounter (Signed)
I called and spoke with the patient and I sent in another tube of the nystatin cream she stated she was out of it.  Kelsey Mason,cma

## 2022-01-07 ENCOUNTER — Other Ambulatory Visit
Admission: RE | Admit: 2022-01-07 | Discharge: 2022-01-07 | Disposition: A | Discharge status: Home / Self Care | Payer: Medicare PPO | Source: Home / Self Care | Attending: Oncology | Admitting: Oncology

## 2022-01-07 ENCOUNTER — Ambulatory Visit
Admission: RE | Admit: 2022-01-07 | Discharge: 2022-01-07 | Disposition: A | Discharge status: Home / Self Care | Payer: Medicare PPO | Source: Ambulatory Visit | Attending: Pulmonary Disease | Admitting: Pulmonary Disease

## 2022-01-07 DIAGNOSIS — Z853 Personal history of malignant neoplasm of breast: Secondary | ICD-10-CM | POA: Insufficient documentation

## 2022-01-07 DIAGNOSIS — R9389 Abnormal findings on diagnostic imaging of other specified body structures: Secondary | ICD-10-CM | POA: Insufficient documentation

## 2022-01-07 DIAGNOSIS — J189 Pneumonia, unspecified organism: Secondary | ICD-10-CM | POA: Diagnosis not present

## 2022-01-07 DIAGNOSIS — R918 Other nonspecific abnormal finding of lung field: Secondary | ICD-10-CM | POA: Diagnosis not present

## 2022-01-07 DIAGNOSIS — Z583 Exposure to soil pollution: Secondary | ICD-10-CM | POA: Insufficient documentation

## 2022-01-07 DIAGNOSIS — T17800S Unspecified foreign body in other parts of respiratory tract causing asphyxiation, sequela: Secondary | ICD-10-CM | POA: Insufficient documentation

## 2022-01-07 DIAGNOSIS — R911 Solitary pulmonary nodule: Secondary | ICD-10-CM | POA: Diagnosis not present

## 2022-01-07 LAB — CBC WITH DIFFERENTIAL/PLATELET
Abs Immature Granulocytes: 0.07 10*3/uL (ref 0.00–0.07)
Basophils Absolute: 0.1 10*3/uL (ref 0.0–0.1)
Basophils Relative: 1 %
Eosinophils Absolute: 0.5 10*3/uL (ref 0.0–0.5)
Eosinophils Relative: 6 %
HCT: 39.6 % (ref 36.0–46.0)
Hemoglobin: 12.9 g/dL (ref 12.0–15.0)
Immature Granulocytes: 1 %
Lymphocytes Relative: 16 %
Lymphs Abs: 1.4 10*3/uL (ref 0.7–4.0)
MCH: 29.1 pg (ref 26.0–34.0)
MCHC: 32.6 g/dL (ref 30.0–36.0)
MCV: 89.2 fL (ref 80.0–100.0)
Monocytes Absolute: 0.5 10*3/uL (ref 0.1–1.0)
Monocytes Relative: 6 %
Neutro Abs: 6.2 10*3/uL (ref 1.7–7.7)
Neutrophils Relative %: 70 %
Platelets: 196 10*3/uL (ref 150–400)
RBC: 4.44 MIL/uL (ref 3.87–5.11)
RDW: 12.5 % (ref 11.5–15.5)
WBC: 8.8 10*3/uL (ref 4.0–10.5)
nRBC: 0 % (ref 0.0–0.2)

## 2022-01-07 LAB — COMPREHENSIVE METABOLIC PANEL
ALT: 17 U/L (ref 0–44)
AST: 14 U/L — ABNORMAL LOW (ref 15–41)
Albumin: 4.1 g/dL (ref 3.5–5.0)
Alkaline Phosphatase: 73 U/L (ref 38–126)
Anion gap: 8 (ref 5–15)
BUN: 32 mg/dL — ABNORMAL HIGH (ref 8–23)
CO2: 25 mmol/L (ref 22–32)
Calcium: 9 mg/dL (ref 8.9–10.3)
Chloride: 103 mmol/L (ref 98–111)
Creatinine, Ser: 1.77 mg/dL — ABNORMAL HIGH (ref 0.44–1.00)
GFR, Estimated: 29 mL/min — ABNORMAL LOW (ref >60)
Glucose, Bld: 158 mg/dL — ABNORMAL HIGH (ref 70–99)
Potassium: 4.9 mmol/L (ref 3.5–5.1)
Sodium: 136 mmol/L (ref 135–145)
Total Bilirubin: 0.8 mg/dL (ref 0.3–1.2)
Total Protein: 6.5 g/dL (ref 6.5–8.1)

## 2022-01-09 LAB — IMMUNOGLOBULINS A/E/G/M, SERUM
IgA: 125 mg/dL (ref 64–422)
IgE (Immunoglobulin E), Serum: 142 IU/mL (ref 6–495)
IgG (Immunoglobin G), Serum: 597 mg/dL (ref 586–1602)
IgM (Immunoglobulin M), Srm: 36 mg/dL (ref 26–217)

## 2022-01-11 ENCOUNTER — Other Ambulatory Visit: Payer: Self-pay

## 2022-01-11 DIAGNOSIS — Z853 Personal history of malignant neoplasm of breast: Secondary | ICD-10-CM

## 2022-01-12 ENCOUNTER — Ambulatory Visit: Payer: Medicare PPO | Admitting: Pulmonary Disease

## 2022-02-11 ENCOUNTER — Other Ambulatory Visit: Payer: Self-pay | Admitting: Family Medicine

## 2022-02-11 DIAGNOSIS — B372 Candidiasis of skin and nail: Secondary | ICD-10-CM

## 2022-03-15 ENCOUNTER — Other Ambulatory Visit: Payer: Self-pay | Admitting: Internal Medicine

## 2022-03-16 ENCOUNTER — Ambulatory Visit: Payer: Medicare PPO | Admitting: Family Medicine

## 2022-03-19 ENCOUNTER — Ambulatory Visit: Payer: Medicare PPO | Admitting: Family Medicine

## 2022-03-19 ENCOUNTER — Encounter: Payer: Self-pay | Admitting: Family Medicine

## 2022-03-19 VITALS — BP 140/70 | HR 55 | Temp 98.3°F | Ht 62.5 in | Wt 153.8 lb

## 2022-03-19 DIAGNOSIS — Z5181 Encounter for therapeutic drug level monitoring: Secondary | ICD-10-CM

## 2022-03-19 DIAGNOSIS — N1832 Chronic kidney disease, stage 3b: Secondary | ICD-10-CM

## 2022-03-19 DIAGNOSIS — E1122 Type 2 diabetes mellitus with diabetic chronic kidney disease: Secondary | ICD-10-CM

## 2022-03-19 DIAGNOSIS — Z23 Encounter for immunization: Secondary | ICD-10-CM | POA: Diagnosis not present

## 2022-03-19 DIAGNOSIS — Z1231 Encounter for screening mammogram for malignant neoplasm of breast: Secondary | ICD-10-CM | POA: Insufficient documentation

## 2022-03-19 DIAGNOSIS — B372 Candidiasis of skin and nail: Secondary | ICD-10-CM | POA: Diagnosis not present

## 2022-03-19 DIAGNOSIS — I5022 Chronic systolic (congestive) heart failure: Secondary | ICD-10-CM | POA: Diagnosis not present

## 2022-03-19 DIAGNOSIS — Z794 Long term (current) use of insulin: Secondary | ICD-10-CM | POA: Diagnosis not present

## 2022-03-19 LAB — BASIC METABOLIC PANEL
BUN: 18 mg/dL (ref 6–23)
CO2: 27 mEq/L (ref 19–32)
Calcium: 8.7 mg/dL (ref 8.4–10.5)
Chloride: 104 mEq/L (ref 96–112)
Creatinine, Ser: 1.52 mg/dL — ABNORMAL HIGH (ref 0.40–1.20)
GFR: 32.08 mL/min — ABNORMAL LOW (ref >60.00)
Glucose, Bld: 97 mg/dL (ref 70–99)
Potassium: 4.7 mEq/L (ref 3.5–5.1)
Sodium: 138 mEq/L (ref 135–145)

## 2022-03-19 LAB — MAGNESIUM: Magnesium: 2.3 mg/dL (ref 1.5–2.5)

## 2022-03-19 LAB — HEMOGLOBIN A1C: Hgb A1c MFr Bld: 6.6 % — ABNORMAL HIGH (ref 4.6–6.5)

## 2022-03-19 MED ORDER — LANTUS SOLOSTAR 100 UNIT/ML ~~LOC~~ SOPN: 30.0000 [IU] | PEN_INJECTOR | Freq: Every day | SUBCUTANEOUS | 0 refills | Status: DC

## 2022-03-19 NOTE — Patient Instructions (Signed)
Nice to see you. We will contact you with your lab results. Please reduce your Lantus dose to 30 units daily.  If you continue to have sugars less than 70 with this dose reduction please let us or endocrinology know.

## 2022-03-19 NOTE — Assessment & Plan Note (Signed)
We will check her magnesium level given that she is on omeprazole.

## 2022-03-19 NOTE — Assessment & Plan Note (Signed)
Euvolemic.  She will continue losartan 50 mg twice daily, Lasix 20 mg daily as needed, carvedilol 12.5 mg twice daily, and Farxiga 10 mg daily.

## 2022-03-19 NOTE — Assessment & Plan Note (Signed)
Check A1c.  She has been having intermittent low sugars.  We will reduce her Lantus to 30 units daily.  She will continue Farxiga 10 mg daily and Trulicity 1.5 mg weekly.  She will see endocrinology as planned.  If she continues to have low sugars she will let us or endocrinology know.

## 2022-03-19 NOTE — Assessment & Plan Note (Signed)
Resolved.  Discussed that if she has any recurrent issues with this we may need to consider discontinuing her [place].  She will let us know if she has any recurrent symptoms.

## 2022-03-19 NOTE — Progress Notes (Signed)
Kelsey Rumps, MD Phone: [phone]  Kelsey Mason is a 81 y.o. female who presents today for f/u.  Marland KitchenDIABETES Disease Monitoring: Blood Sugar ranges-60-90 Polyuria/phagia/dipsia- no      Optho- UTD Medications: Compliance- taking farxiga, trulicity, lantus 35 u daily, though notes she has self decreased down to 10 u when her sugar is less than 100 in the morning Hypoglycemic symptoms- yes  Yeast infection: Patient notes her prior yeast infection has resolved.    CKD: No NSAIDs.  She continues to follow with nephrology.  CHF: Continues to take carvedilol, Lasix as needed, losartan, and [place].  She notes no orthopnea or PND.  No shortness of breath.  She occasionally has some lower extremity edema if she eats a bunch of salty foods.  Patient wonders about starting on magnesium to help with leg cramps and heart health.  She is on omeprazole.  Social History   Tobacco Use  Smoking Status Former   Packs/day: 0.25   Years: 1.00   Total pack years: 0.25   Types: Cigarettes   Quit date: 1963   Years since quitting: 60.7  Smokeless Tobacco Never    Current Outpatient Medications on File Prior to Visit  Medication Sig Dispense Refill   albuterol (VENTOLIN HFA) 108 (90 Base) MCG/ACT inhaler TAKE 2 PUFFS BY MOUTH EVERY 6 HOURS AS NEEDED FOR WHEEZE OR SHORTNESS OF BREATH 18 each 11   aspirin 81 MG tablet Take 81 mg by mouth daily.     blood glucose meter kit and supplies KIT Dispense based on patient and insurance preference. Use up to four times daily as directed. One Touch Ultra mini meter (FOR ICD-9 250.00, 250.01). Dx: E11.9 1 each 0   carvedilol (COREG) 12.5 MG tablet TAKE 1 TABLET BY MOUTH TWICE A DAY 180 tablet 0   cholecalciferol (VITAMIN D) 25 MCG (1000 UT) tablet Take 1,000 Units by mouth daily.     dapagliflozin propanediol (FARXIGA) 10 MG TABS tablet Take 10 mg by mouth daily.     furosemide (LASIX) 20 MG tablet TAKE 1 TABLET (20 MG TOTAL) BY MOUTH DAILY AS  NEEDED (FOR >2LB WEIGHT GAIN OVERNIGHT OR 5 LB WEIGHT GAIN IN 1 WEEK OR SWELLING.). 90 tablet 3   glucose blood (ONE TOUCH ULTRA TEST) test strip USE TO TEST BLOOD SUGAR UP TO 4 TIMES DAILY 100 each 6   losartan (COZAAR) 100 MG tablet TAKE 1/2 TABLET (50 MG TOTAL) BY MOUTH 2 (TWO) TIMES DAILY. 90 tablet 1   nitroGLYCERIN (NITROSTAT) 0.4 MG SL tablet PLACE 1 TABLET (0.4 MG TOTAL) UNDER THE TONGUE EVERY 5 (FIVE) MINUTES AS NEEDED FOR CHEST PAIN. MAXIMUM OF 3 DOSES. 25 tablet 3   NOVOFINE PEN NEEDLE 32G X 6 MM MISC USE AS DIRECTED WITH LANTUS PEN 100 each 2   nystatin cream (MYCOSTATIN) APPLY 1 APPLICATION TOPICALLY TWICE A DAY 60 g 0   omeprazole (PRILOSEC) 20 MG capsule TAKE 1 CAPSULE BY MOUTH EVERY DAY 90 capsule 1   rosuvastatin (CRESTOR) 20 MG tablet Take 0.5 tablets (10 mg total) by mouth daily. 45 tablet 3   TRULICITY 1.5 VH/8.4ON SOPN Inject 1.5 mg as directed once a week.  11   vitamin B-12 (CYANOCOBALAMIN) 1000 MCG tablet Take 1,000 mcg by mouth daily.     ezetimibe (ZETIA) 10 MG tablet Take 1 tablet (10 mg total) by mouth daily. 90 tablet 3   No current facility-administered medications on file prior to visit.     ROS see history of  present illness  Objective  Physical Exam Vitals:   03/19/22 0946  BP: (!) 140/70  Pulse: (!) 55  Temp: 98.3 F (36.8 C)  SpO2: 99%    BP Readings from Last 3 Encounters:  03/19/22 (!) 140/70  12/04/21 130/80  11/13/21 (!) 180/65   Wt Readings from Last 3 Encounters:  03/19/22 153 lb 12.8 oz (69.8 kg)  12/04/21 154 lb 3.2 oz (69.9 kg)  11/13/21 153 lb (69.4 kg)    Physical Exam Constitutional:      General: She is not in acute distress.    Appearance: She is not diaphoretic.  Cardiovascular:     Rate and Rhythm: Normal rate and regular rhythm.     Heart sounds: Normal heart sounds.  Pulmonary:     Effort: Pulmonary effort is normal.     Breath sounds: Normal breath sounds.  Musculoskeletal:     Right lower leg: No edema.      Left lower leg: No edema.  Skin:    General: Skin is warm and dry.  Neurological:     Mental Status: She is alert.      Assessment/Plan: Please see individual problem list.  Problem List Items Addressed This Visit     Candidal intertrigo (Chronic)    Resolved.  Discussed that if she has any recurrent issues with this we may need to consider discontinuing her [place].  She will let us know if she has any recurrent symptoms.      Chronic HFrEF (heart failure with reduced ejection fraction) (HCC) (Chronic)    Euvolemic.  She will continue losartan 50 mg twice daily, Lasix 20 mg daily as needed, carvedilol 12.5 mg twice daily, and Farxiga 10 mg daily.      Relevant Orders   Basic Metabolic Panel (BMET)   Type 2 diabetes mellitus with stage 3 chronic kidney disease, with long-term current use of insulin (HCC) - Primary (Chronic)    Check A1c.  She has been having intermittent low sugars.  We will reduce her Lantus to 30 units daily.  She will continue Farxiga 10 mg daily and Trulicity 1.5 mg weekly.  She will see endocrinology as planned.  If she continues to have low sugars she will let us or endocrinology know.      Relevant Medications   insulin glargine (LANTUS SOLOSTAR) 100 UNIT/ML Solostar Pen   Other Relevant Orders   Basic Metabolic Panel (BMET)   HgB A1c   Chronic kidney disease, stage 3b (HCC)    Avoid NSAIDs.  Check renal function.      Medication monitoring encounter    We will check her magnesium level given that she is on omeprazole.      Relevant Orders   Magnesium   Other Visit Diagnoses     Need for immunization against influenza       Relevant Orders   Flu Vaccine QUAD High Dose(Fluad) (Completed)       Return in about 3 months (around 06/18/2022).   Kelsey Rumps, MD [place]

## 2022-03-19 NOTE — Assessment & Plan Note (Signed)
Avoid NSAIDs.  Check renal function.

## 2022-03-24 DIAGNOSIS — Z86010 Personal history of colonic polyps: Secondary | ICD-10-CM | POA: Diagnosis not present

## 2022-04-22 DIAGNOSIS — E1159 Type 2 diabetes mellitus with other circulatory complications: Secondary | ICD-10-CM | POA: Diagnosis not present

## 2022-04-29 DIAGNOSIS — I10 Essential (primary) hypertension: Secondary | ICD-10-CM | POA: Diagnosis not present

## 2022-04-29 DIAGNOSIS — R809 Proteinuria, unspecified: Secondary | ICD-10-CM | POA: Diagnosis not present

## 2022-04-29 DIAGNOSIS — N1832 Chronic kidney disease, stage 3b: Secondary | ICD-10-CM | POA: Diagnosis not present

## 2022-04-29 DIAGNOSIS — E1159 Type 2 diabetes mellitus with other circulatory complications: Secondary | ICD-10-CM | POA: Diagnosis not present

## 2022-04-29 DIAGNOSIS — E1129 Type 2 diabetes mellitus with other diabetic kidney complication: Secondary | ICD-10-CM | POA: Diagnosis not present

## 2022-04-29 DIAGNOSIS — Z794 Long term (current) use of insulin: Secondary | ICD-10-CM | POA: Diagnosis not present

## 2022-04-29 DIAGNOSIS — E1122 Type 2 diabetes mellitus with diabetic chronic kidney disease: Secondary | ICD-10-CM | POA: Diagnosis not present

## 2022-05-05 ENCOUNTER — Ambulatory Visit: Payer: Medicare PPO | Attending: Internal Medicine | Admitting: Internal Medicine

## 2022-05-05 ENCOUNTER — Other Ambulatory Visit
Admission: RE | Admit: 2022-05-05 | Discharge: 2022-05-05 | Disposition: A | Discharge status: Home / Self Care | Payer: Medicare PPO | Source: Ambulatory Visit | Attending: Internal Medicine | Admitting: Internal Medicine

## 2022-05-05 ENCOUNTER — Encounter: Payer: Self-pay | Admitting: Internal Medicine

## 2022-05-05 VITALS — BP 120/78 | HR 66 | Ht 62.5 in | Wt 155.0 lb

## 2022-05-05 DIAGNOSIS — E785 Hyperlipidemia, unspecified: Secondary | ICD-10-CM | POA: Insufficient documentation

## 2022-05-05 DIAGNOSIS — I2510 Atherosclerotic heart disease of native coronary artery without angina pectoris: Secondary | ICD-10-CM | POA: Insufficient documentation

## 2022-05-05 DIAGNOSIS — E1169 Type 2 diabetes mellitus with other specified complication: Secondary | ICD-10-CM | POA: Insufficient documentation

## 2022-05-05 DIAGNOSIS — I739 Peripheral vascular disease, unspecified: Secondary | ICD-10-CM | POA: Diagnosis not present

## 2022-05-05 DIAGNOSIS — I428 Other cardiomyopathies: Secondary | ICD-10-CM | POA: Diagnosis not present

## 2022-05-05 DIAGNOSIS — I5022 Chronic systolic (congestive) heart failure: Secondary | ICD-10-CM | POA: Diagnosis not present

## 2022-05-05 DIAGNOSIS — N1832 Chronic kidney disease, stage 3b: Secondary | ICD-10-CM

## 2022-05-05 DIAGNOSIS — G720 Drug-induced myopathy: Secondary | ICD-10-CM | POA: Diagnosis not present

## 2022-05-05 DIAGNOSIS — I10 Essential (primary) hypertension: Secondary | ICD-10-CM

## 2022-05-05 DIAGNOSIS — T466X5A Adverse effect of antihyperlipidemic and antiarteriosclerotic drugs, initial encounter: Secondary | ICD-10-CM

## 2022-05-05 LAB — LIPID PANEL
Cholesterol: 206 mg/dL — ABNORMAL HIGH (ref 0–200)
HDL: 56 mg/dL (ref >40)
LDL Cholesterol: 134 mg/dL — ABNORMAL HIGH (ref 0–99)
Total CHOL/HDL Ratio: 3.7 RATIO
Triglycerides: 82 mg/dL (ref <150)
VLDL: 16 mg/dL (ref 0–40)

## 2022-05-05 NOTE — Patient Instructions (Addendum)
Medication Instructions:  Your physician recommends that you continue on your current medications as directed. Please refer to the Current Medication list given to you today.  *If you need a refill on your cardiac medications before your next appointment, please call your pharmacy*  Lab Work: Your physician recommends that you have lab work today- lipid panel  If you have labs (blood work) drawn today and your tests are completely normal, you will receive your results only by: MyChart Message (if you have MyChart) OR A paper copy in the mail If you have any lab test that is abnormal or we need to change your treatment, we will call you to review the results.  Testing/Procedures: None ordered today.  Follow-Up: At [facility], you and your health needs are our priority.  As part of our continuing mission to provide you with exceptional heart care, we have created designated Provider Care Teams.  These Care Teams include your primary Cardiologist (physician) and Advanced Practice Providers (APPs -  Physician Assistants and Nurse Practitioners) who all work together to provide you with the care you need, when you need it.  We recommend signing up for the patient portal called "MyChart".  Sign up information is provided on this After Visit Summary.  MyChart is used to connect with patients for Virtual Visits (Telemedicine).  Patients are able to view lab/test results, encounter notes, upcoming appointments, etc.  Non-urgent messages can be sent to your provider as well.   To learn more about what you can do with MyChart, go to [email or web address].    Your next appointment:   6 month(s)  The format for your next appointment:   In Person  Provider:   You may see Nelva Bush, MD or one of the following Advanced Practice Providers on your designated Care Team:   Murray Hodgkins, NP Christell Faith, PA-C Cadence Kathlen Mody, PA-C Gerrie Nordmann, NP      Important Information About  Sugar

## 2022-05-05 NOTE — Progress Notes (Signed)
Follow-up Outpatient Visit Date: 05/05/2022  Primary Care Provider: Leone Haven, MD [address] [place] [address]  Chief Complaint: Follow-up CAD, HFrEF, and PAD  HPI:  Kelsey Mason is a 81 y.o. female with history of nonobstructive coronary artery disease, peripheral vascular disease with small vessel disease involving the runoff vessels, nonischemic cardiomyopathy, mitral regurgitation, hypertension, hyperlipidemia, type 2 diabetes mellitus, mild to moderate renal artery stenosis, and breast cancer, who presents for follow-up of CAD, PAD, and HFrEF.  She was last seen in our office in May by Ignacia Bayley, NP, at which time she was feeling fairly well.  She reported stable bilateral calf pain when dancing for extended periods.  This did not improve with de-escalation of rosuvastatin.  She was therefore transitioned to ezetimibe to see if her leg pain would improve.  Today, Kelsey Mason reports that she has been feeling fairly well.  She still has some discomfort in her calves though it has improved with discontinuation of rosuvastatin.  She is tolerating ezetimibe well.  She denies chest pain, shortness of breath, palpitations, and lightheadedness.  She has intermittent swelling about the ankles for which she uses as needed furosemide 2-3 times per week.  Home blood pressures are typically around 100/65.  She continues to go dancing regularly and is also able to push mow her lawn without difficulty.  --------------------------------------------------------------------------------------------------  Cardiovascular History & Procedures: Cardiovascular Problems: Nonobstructive coronary artery disease Chronic HFmrEF due to nonischemic cardiomyopathy Mitral regurgitation Claudication   Risk Factors: HTN, HLD, DM2, and age > 54   Cath/PCI: LHC (04/21/2018): LMCA normal.  LAD with 50% mid vessel stenosis.  LCx proper without disease.  Dominant OM3 with 50-60% proximal  stenosis.  RCA with 50% ostial stenosis.  Mildly elevated LVEDP (15 to 20 mmHg). Abdominal aortogram and runoff (04/21/2018): Aorta, inflow vessels, and outflow vessels.  There is tapering/occlusion of the distal anterior tibial and peroneal arteries on the right.  There is three-vessel runoff on the left.   CV Surgery: None   EP Procedures and Devices: None   Non-Invasive Evaluation(s): TTE (01/30/2020): Normal LV size with LVEF 40% and global hypokinesis.  Grade 1 diastolic dysfunction with elevated filling pressure.  Low normal RV function with normal size and wall thickness.  Mild left atrial enlargement.  Mild to moderate mitral regurgitation. TTE (03/14/18): Mildly dilated LV with LVEF 40-45%.  Global hypokinesis with grade 2 diastolic dysfunction.  Moderate MR and mild LA enlargement.  Normal RV size and function. ABIs (02/22/2018): Noncompressible runoff vessels bilaterally with triphasic waveforms.  TBI's are normal bilaterally. Renal artery Doppler (04/20/17): Mild to moderate (less than 60%) stenosis of the right renal artery.  No significant left renal artery disease. TTE (01/14/17): Normal LV size with LVEF 50-55%. Anteroseptal hypokinesis is noted, as well as grade 1 diastolic dysfunction. Mild to moderate MR. Normal RV size and function. Mild pulmonary hypertension. Pharmacologic myocardial perfusion stress test (06/16/16): Low risk study with moderate in size, moderate in severity, fixed defect involving the septum most likely related to LBBB. No evidence of ischemia. LVEF 50% by automated calculation and likely higher based on visual estimation. ABIs (06/10/16): ABIs: Right not obtainable, left 1.4. TBIs right 1.1, left not obtainable. Bilateral great toe PPG's are normal. Bilateral common femoral, popliteal, peroneal, anterior tibial, and posterior tibial artery waveforms are brisk and triphasic. TTE (02/23/16, [facility]): Mildly dilated left ventricle with mild to moderate LV  dysfunction (EF 35-45%). Grade 1 diastolic dysfunction. Moderate mitral regurgitation.  Mild left atrial enlargement. Normal RV function. TTE (01/01/14, [facility]): Moderate to severe LV dysfunction (EF 30%) with mild LVH and mild left ventricular dilation. Mitral annular calcification with moderate MR and moderate TR noted. Normal right ventricular contraction. MUGA (12/25/12): Normal contraction and wall motion. EF 63%.  Recent CV Pertinent Labs: Lab Results  Component Value Date   CHOL 151 11/06/2019   CHOL 156 08/01/2019   HDL 47.10 11/06/2019   HDL 45 08/01/2019   LDLCALC 73 11/06/2019   LDLCALC 88 08/01/2019   LDLDIRECT 64.0 09/02/2021   TRIG 154.0 (H) 11/06/2019   CHOLHDL 3 11/06/2019   K 4.7 03/19/2022   K 4.7 01/07/2014   MG 2.3 03/19/2022   BUN 18 03/19/2022   BUN 19 02/19/2019   BUN 19 (H) 01/07/2014   CREATININE 1.52 (H) 03/19/2022   CREATININE 1.24 01/07/2014    Past medical and surgical history were reviewed and updated in EPIC.  Current Meds  Medication Sig   albuterol (VENTOLIN HFA) 108 (90 Base) MCG/ACT inhaler TAKE 2 PUFFS BY MOUTH EVERY 6 HOURS AS NEEDED FOR WHEEZE OR SHORTNESS OF BREATH   aspirin 81 MG tablet Take 81 mg by mouth daily.   blood glucose meter kit and supplies KIT Dispense based on patient and insurance preference. Use up to four times daily as directed. One Touch Ultra mini meter (FOR ICD-9 250.00, 250.01). Dx: E11.9   carvedilol (COREG) 12.5 MG tablet TAKE 1 TABLET BY MOUTH TWICE A DAY   cholecalciferol (VITAMIN D) 25 MCG (1000 UT) tablet Take 1,000 Units by mouth daily.   dapagliflozin propanediol (FARXIGA) 10 MG TABS tablet Take 10 mg by mouth daily.   ezetimibe (ZETIA) 10 MG tablet Take 1 tablet (10 mg total) by mouth daily.   furosemide (LASIX) 20 MG tablet TAKE 1 TABLET (20 MG TOTAL) BY MOUTH DAILY AS NEEDED (FOR >2LB WEIGHT GAIN OVERNIGHT OR 5 LB WEIGHT GAIN IN 1 WEEK OR SWELLING.).   glucose blood (ONE TOUCH ULTRA TEST) test strip  USE TO TEST BLOOD SUGAR UP TO 4 TIMES DAILY   insulin glargine (LANTUS SOLOSTAR) 100 UNIT/ML Solostar Pen Inject 30 Units into the skin daily.   losartan (COZAAR) 100 MG tablet TAKE 1/2 TABLET (50 MG TOTAL) BY MOUTH 2 (TWO) TIMES DAILY.   nitroGLYCERIN (NITROSTAT) 0.4 MG SL tablet PLACE 1 TABLET (0.4 MG TOTAL) UNDER THE TONGUE EVERY 5 (FIVE) MINUTES AS NEEDED FOR CHEST PAIN. MAXIMUM OF 3 DOSES.   NOVOFINE PEN NEEDLE 32G X 6 MM MISC USE AS DIRECTED WITH LANTUS PEN   rosuvastatin (CRESTOR) 20 MG tablet Take 0.5 tablets (10 mg total) by mouth daily.   TRULICITY 1.5 ZO/1.0RU SOPN Inject 1.5 mg as directed once a week.   vitamin B-12 (CYANOCOBALAMIN) 1000 MCG tablet Take 1,000 mcg by mouth daily.    Allergies: Prednisone, Advil [ibuprofen], Aleve [naproxen], Fluticasone-salmeterol, Lipitor [atorvastatin], Naproxen sodium, and Penicillins  Social History   Tobacco Use   Smoking status: Former    Packs/day: 0.25    Years: 1.00    Total pack years: 0.25    Types: Cigarettes    Quit date: 1963    Years since quitting: 60.8   Smokeless tobacco: Never  Vaping Use   Vaping Use: Never used  Substance Use Topics   Alcohol use: No    Alcohol/week: 0.0 standard drinks of alcohol   Drug use: No    Family History  Problem Relation Age of Onset   Alcohol abuse Mother  Heart disease Mother    Hypertension Mother    Breast cancer Mother 64   Alcohol abuse Father    Heart disease Father    Hypertension Father    Heart disease Brother    Heart attack Brother     Review of Systems: A 12-system review of systems was performed and was negative except as noted in the HPI.  --------------------------------------------------------------------------------------------------  Physical Exam: BP 120/78 (BP Location: Left Arm, Patient Position: Sitting, Cuff Size: Normal)   Pulse 66   Ht 5' 2.5" (1.588 m)   Wt 155 lb (70.3 kg)   SpO2 99%   BMI 27.90 kg/m   General:  NAD. Neck: No JVD or  HJR. Lungs: Clear to auscultation bilaterally without wheezes or crackles. Heart: Regular rate and rhythm without murmurs, rubs, or gallops. Abdomen: Soft, nontender, nondistended. Extremities: No lower extremity edema.  Dorsal Alice pedis pulses are 2+ bilaterally.  Posterior tibial pulses are trace bilaterally.  EKG: Normal sinus rhythm with left bundle branch block.  No significant change from prior tracing on 11/03/2021.  Lab Results  Component Value Date   WBC 8.8 01/07/2022   HGB 12.9 01/07/2022   HCT 39.6 01/07/2022   MCV 89.2 01/07/2022   PLT 196 01/07/2022    Lab Results  Component Value Date   NA 138 03/19/2022   K 4.7 03/19/2022   CL 104 03/19/2022   CO2 27 03/19/2022   BUN 18 03/19/2022   CREATININE 1.52 (H) 03/19/2022   GLUCOSE 97 03/19/2022   ALT 17 01/07/2022    Lab Results  Component Value Date   CHOL 151 11/06/2019   HDL 47.10 11/06/2019   LDLCALC 73 11/06/2019   LDLDIRECT 64.0 09/02/2021   TRIG 154.0 (H) 11/06/2019   CHOLHDL 3 11/06/2019    --------------------------------------------------------------------------------------------------  ASSESSMENT AND PLAN: Chronic HFrEF due to nonischemic cardiomyopathy: Kelsey Mason appears euvolemic with stable NYHA class I-II symptoms.  Her lower extremity edema is well controlled with as needed furosemide.  We will continue her current regimen of carvedilol, dapagliflozin, and losartan.  Coronary artery disease: No angina reported in the setting of nonobstructive CAD by prior catheterization.  Given statin intolerance, we will plan to continue ezetimibe and aspirin.  PAD: Leg pain has improved with discontinuation of rosuvastatin though some claudication persists.  Prior lower extremity runoff showed small vessel disease not amenable to percutaneous intervention.  Given relatively mild, nonlifestyle limiting symptoms, we will continue with medical therapy.  I encouraged Ms. Banbury to remain as active as  possible.  She should remain on aspirin and ezetimibe for secondary prevention.  Hyperlipidemia associated with type 2 diabetes mellitus and statin myopathy: Leg discomfort has improved with discontinuation of rosuvastatin and initiation of ezetimibe.  We will check a lipid panel today to ensure that her lipids are at goal.  Ongoing management of diabetes mellitus per Dr. Solum.  Chronic kidney disease stage 3b: Continue current medications with avoidance of nephrotoxic agents.  Ongoing follow-up through nephrology.  Hypertension: Blood pressure well controlled today.  No medication changes at this time.  Follow-up: Return to clinic in 6 months.   , MD 05/05/2022 9:04 AM  

## 2022-05-06 ENCOUNTER — Telehealth: Payer: Self-pay | Admitting: *Deleted

## 2022-05-06 NOTE — Telephone Encounter (Signed)
Covering for Ignacia Bayley, NP. Patient's wishes noted.

## 2022-05-06 NOTE — Telephone Encounter (Signed)
Reviewed results and recommendations with patient. She would like to hold off on Repatha for now and will think about it. Patient states that she will call back if she decides to try that medication. Will update provider on her wishes.

## 2022-05-06 NOTE — Telephone Encounter (Signed)
-----   Message from Theora Gianotti, NP sent at 05/05/2022  3:26 PM EDT ----- LDL inadequately controlled on zetia alone (started in May).  LDL on rosuvastatin previously 73, now 134 with total cholesterol of 206 (2as 151).  If she never started zetia, I recommend starting.  Otherwise, with h/o statin intolerance, I'd recommend starting Repatha '140mg'$  every 2 wks.

## 2022-05-07 ENCOUNTER — Other Ambulatory Visit: Payer: Self-pay

## 2022-05-07 ENCOUNTER — Telehealth: Payer: Self-pay | Admitting: Internal Medicine

## 2022-05-07 DIAGNOSIS — E1169 Type 2 diabetes mellitus with other specified complication: Secondary | ICD-10-CM

## 2022-05-07 DIAGNOSIS — I2510 Atherosclerotic heart disease of native coronary artery without angina pectoris: Secondary | ICD-10-CM

## 2022-05-07 DIAGNOSIS — Z5181 Encounter for therapeutic drug level monitoring: Secondary | ICD-10-CM

## 2022-05-07 MED ORDER — NEXLIZET 180-10 MG PO TABS: 1.0000 | ORAL_TABLET | Freq: Every day | ORAL | 6 refills | Status: DC

## 2022-05-07 MED ORDER — REPATHA SURECLICK 140 MG/ML ~~LOC~~ SOAJ: 140.0000 mg | SUBCUTANEOUS | 6 refills | Status: DC

## 2022-05-07 NOTE — Telephone Encounter (Signed)
I attempted to reach Kelsey Mason to discuss results of her lipid panel, which show suboptimal control of her LDL after switching from rosuvastatin to ezetimibe.  She has been intolerant of atorvastatin and rosuvastatin now.  Given history of moderate CAD as well as small vessel PAD with mild claudication, I recommend more aggressive lipid therapy then ezetimibe.  If she is agreeable, I recommend switching ezetimibe to Nexlizet 180/10 mg daily.  Nelva Bush, MD [facility]

## 2022-05-07 NOTE — Telephone Encounter (Signed)
Spoke with patient and she wanted to review her lab results in more detail so she may understand the reason for starting this new medication. Once we reviewed then she was agreeable to start [place]. Reviewed that this can take some time to get approved but that we would work on that and will be in touch. She verbalized understanding with no further questions at this time.     ----- Message from Theora Gianotti, NP sent at 05/05/2022  3:26 PM EDT ----- LDL inadequately controlled on zetia alone (started in May).  LDL on rosuvastatin previously 73, now 134 with total cholesterol of 206 (2as 151).  If she never started zetia, I recommend starting.  Otherwise, with h/o statin intolerance, I'd recommend starting Repatha '140mg'$  every 2 wks.

## 2022-05-07 NOTE — Telephone Encounter (Signed)
Spoke w/ pt and advised her of Dr. Darnelle Bos recommendation. She is agreeable to taking the new rx, but states that she spoke w/ a nurse this am that recommended she take Repatha shot. She states that as it may take awhile to get this approved, she would prefer to take the pill and see if ins will approved the Repatha and then compare the costs. She requests 30 day supply be sent to [facility]. Will place order for labs in 3 mos. Asked her to call back w/ any questions or concerns.

## 2022-05-07 NOTE — Telephone Encounter (Signed)
Pt c/o medication issue:  1. Name of Medication: cholesterol shot  2. How are you currently taking this medication (dosage and times per day)? Not started  3. Are you having a reaction (difficulty breathing--STAT)? no  4. What is your medication issue? Patient states she has changed her mind and would like to start the cholesterol injectable medication.

## 2022-05-10 ENCOUNTER — Telehealth: Payer: Self-pay

## 2022-05-10 NOTE — Telephone Encounter (Signed)
Nexlizet prior authorization initiated in [email or web address] KEY: [id number] Response: This request has been approved. PA Case: [id number], Status: Approved, Coverage Starts on: 07/05/2021 12:00:00 AM, Coverage Ends on: 07/05/2023 12:00:00 AM.

## 2022-05-10 NOTE — Telephone Encounter (Signed)
Prior Authorization initiated on [email or web address] KEY: [id number] Response: This request has been approved. PA Case: [id number], Status: Approved, Coverage Starts on: 07/05/2021 12:00:00 AM, Coverage Ends on: 07/05/2023 12:00:00 AM.

## 2022-05-18 ENCOUNTER — Ambulatory Visit
Admission: RE | Admit: 2022-05-18 | Discharge: 2022-05-18 | Disposition: A | Discharge status: Home / Self Care | Payer: Medicare PPO | Source: Ambulatory Visit | Attending: Oncology | Admitting: Oncology

## 2022-05-18 DIAGNOSIS — Z1231 Encounter for screening mammogram for malignant neoplasm of breast: Secondary | ICD-10-CM | POA: Insufficient documentation

## 2022-05-18 DIAGNOSIS — Z853 Personal history of malignant neoplasm of breast: Secondary | ICD-10-CM | POA: Diagnosis not present

## 2022-05-28 ENCOUNTER — Emergency Department
Admission: EM | Admit: 2022-05-28 | Discharge: 2022-05-28 | Disposition: A | Discharge status: Home / Self Care | Payer: Medicare PPO | Attending: Emergency Medicine | Admitting: Emergency Medicine

## 2022-05-28 ENCOUNTER — Emergency Department: Payer: Medicare PPO

## 2022-05-28 ENCOUNTER — Other Ambulatory Visit: Payer: Self-pay

## 2022-05-28 DIAGNOSIS — R42 Dizziness and giddiness: Secondary | ICD-10-CM | POA: Diagnosis not present

## 2022-05-28 DIAGNOSIS — I672 Cerebral atherosclerosis: Secondary | ICD-10-CM | POA: Diagnosis not present

## 2022-05-28 DIAGNOSIS — E119 Type 2 diabetes mellitus without complications: Secondary | ICD-10-CM | POA: Diagnosis not present

## 2022-05-28 DIAGNOSIS — I6523 Occlusion and stenosis of bilateral carotid arteries: Secondary | ICD-10-CM | POA: Diagnosis not present

## 2022-05-28 DIAGNOSIS — R519 Headache, unspecified: Secondary | ICD-10-CM | POA: Diagnosis not present

## 2022-05-28 DIAGNOSIS — I6503 Occlusion and stenosis of bilateral vertebral arteries: Secondary | ICD-10-CM | POA: Diagnosis not present

## 2022-05-28 LAB — URINALYSIS, ROUTINE W REFLEX MICROSCOPIC
Bilirubin Urine: NEGATIVE
Glucose, UA: >=500 mg/dL — AB
Ketones, ur: NEGATIVE mg/dL
Nitrite: NEGATIVE
Protein, ur: NEGATIVE mg/dL
Specific Gravity, Urine: 1.008 (ref 1.005–1.030)
pH: 5 (ref 5.0–8.0)

## 2022-05-28 LAB — CBC
HCT: 42.6 % (ref 36.0–46.0)
Hemoglobin: 14.5 g/dL (ref 12.0–15.0)
MCH: 29.9 pg (ref 26.0–34.0)
MCHC: 34 g/dL (ref 30.0–36.0)
MCV: 87.8 fL (ref 80.0–100.0)
Platelets: 200 10*3/uL (ref 150–400)
RBC: 4.85 MIL/uL (ref 3.87–5.11)
RDW: 12.2 % (ref 11.5–15.5)
WBC: 10.3 10*3/uL (ref 4.0–10.5)
nRBC: 0 % (ref 0.0–0.2)

## 2022-05-28 LAB — BASIC METABOLIC PANEL
Anion gap: 13 (ref 5–15)
BUN: 14 mg/dL (ref 8–23)
CO2: 20 mmol/L — ABNORMAL LOW (ref 22–32)
Calcium: 9.2 mg/dL (ref 8.9–10.3)
Chloride: 105 mmol/L (ref 98–111)
Creatinine, Ser: 1.36 mg/dL — ABNORMAL HIGH (ref 0.44–1.00)
GFR, Estimated: 39 mL/min — ABNORMAL LOW (ref >60)
Glucose, Bld: 98 mg/dL (ref 70–99)
Potassium: 4.3 mmol/L (ref 3.5–5.1)
Sodium: 138 mmol/L (ref 135–145)

## 2022-05-28 LAB — HEPATIC FUNCTION PANEL
ALT: 14 U/L (ref 0–44)
AST: 18 U/L (ref 15–41)
Albumin: 4.3 g/dL (ref 3.5–5.0)
Alkaline Phosphatase: 68 U/L (ref 38–126)
Bilirubin, Direct: <0.1 mg/dL (ref 0.0–0.2)
Total Bilirubin: 1.1 mg/dL (ref 0.3–1.2)
Total Protein: 6.9 g/dL (ref 6.5–8.1)

## 2022-05-28 LAB — CBG MONITORING, ED: Glucose-Capillary: 87 mg/dL (ref 70–99)

## 2022-05-28 LAB — TROPONIN I (HIGH SENSITIVITY)
Troponin I (High Sensitivity): 8 ng/L (ref <18)
Troponin I (High Sensitivity): 8 ng/L (ref <18)

## 2022-05-28 MED ORDER — ACETAMINOPHEN 500 MG PO TABS
1000.0000 mg | ORAL_TABLET | Freq: Once | ORAL | Status: AC
  Administered 2022-05-28: 1000 mg via ORAL
  Filled 2022-05-28: qty 2

## 2022-05-28 MED ORDER — MECLIZINE HCL 25 MG PO TABS: 12.5000 mg | ORAL_TABLET | Freq: Three times a day (TID) | ORAL | 0 refills | Status: AC | PRN

## 2022-05-28 MED ORDER — MECLIZINE HCL 25 MG PO TABS
12.5000 mg | ORAL_TABLET | Freq: Once | ORAL | Status: AC
  Administered 2022-05-28: 12.5 mg via ORAL
  Filled 2022-05-28: qty 1

## 2022-05-28 MED ORDER — IOHEXOL 350 MG/ML SOLN
75.0000 mL | Freq: Once | INTRAVENOUS | Status: AC | PRN
  Administered 2022-05-28: 60 mL via INTRAVENOUS

## 2022-05-28 NOTE — ED Provider Notes (Signed)
[facility] Provider Note    Event Date/Time   First MD Initiated Contact with Patient 05/28/22 1021     (approximate)   History   Dizziness   HPI  Kelsey Mason is a 81 y.o. female with diabetes, hyperlipidemia who comes in with significant dizziness.  Patient reports dizziness started on 11/23 at 2 AM.  Her last known normal was 11/22 at 11pm when she went to bed.  Patient reports that her dizziness is worse when she moves her neck.  Denies any trauma to the neck or accidents recently.  Denies this happening previously except for 30 years ago she did have some vertigo and inner ear issues but denies any recent issues with it.  She reports that its making her feel like she is drunk and has difficulty ambulating with it.  She does not use a walker and lives alone and is still able to ambulate on her own.   Physical Exam   Triage Vital Signs: ED Triage Vitals [05/28/22 0951]  Enc Vitals Group     BP (!) 174/76     Pulse Rate 74     Resp 18     Temp 98.4 F (36.9 C)     Temp Source Oral     SpO2 99 %     Weight 152 lb 9.6 oz (69.2 kg)     Height '5\' 2"'$  (1.575 m)     Head Circumference      Peak Flow      Pain Score 0     Pain Loc      Pain Edu?      Excl. in [place]?     Most recent vital signs: Vitals:   05/28/22 0951  BP: (!) 174/76  Pulse: 74  Resp: 18  Temp: 98.4 F (36.9 C)  SpO2: 99%     General: Awake, no distress.  CV:  Good peripheral perfusion.  Resp:  Normal effort.  Abd:  No distention.  Other:  Cranial nerves II through XII are intact.  Equal strength in arms and legs.  She does have a little bit of discoordination with left hand.  No nystagmus was noted.  She is able to ambulate.  TMs with cerumen noted bilaterally  ED Results / Procedures / Treatments   Labs (all labs ordered are listed, but only abnormal results are displayed) Labs Reviewed  BASIC METABOLIC PANEL - Abnormal; Notable for the following  components:      Result Value   CO2 20 (*)    Creatinine, Ser 1.36 (*)    GFR, Estimated 39 (*)    All other components within normal limits  CBC  HEPATIC FUNCTION PANEL  URINALYSIS, ROUTINE W REFLEX MICROSCOPIC  CBG MONITORING, ED  TROPONIN I (HIGH SENSITIVITY)     EKG  My interpretation of EKG:  Normal sinus rate of 79 without any ST elevation, T wave version aVL, left bundle branch block.  Reviewed prior EKG and similar left bundle branch block  RADIOLOGY I have reviewed the CT head personally and interpretted and no ICH   PROCEDURES:  Critical Care performed: No  Procedures   MEDICATIONS ORDERED IN ED: Medications  meclizine (ANTIVERT) tablet 12.5 mg (has no administration in time range)     IMPRESSION / MDM / ASSESSMENT AND PLAN / ED COURSE  I reviewed the triage vital signs and the nursing notes.   Patient's presentation is most consistent with acute presentation with potential threat to  life or bodily function.   Patient comes in with concerns for dizziness worse with moving neck.  This could be vertigo but given her age and sudden onset with difficulty ambulating will get CTA, MRI to rule out any evidence of dissection, stroke.  Lower suspicion for LVO given out of the window.  Troponins are negative x2.  Urine without evidence of UTI significant squamous cells in it.  Glucose normal.  BMP shows creatinine around baseline.  CBC reassuring.  CTA shows some mild stenosis which I discussed with patient she can follow this up with her primary care doctor.  MRI brain was negative.  Patient given a little bit of Tylenol to help with headache and the meclizine to help with the dizziness.  Patient is up and ambulatory.  She feels comfortable going home and family can be with her to help assist her.  I suspect that this is most likely vertigo given is only when she turns her head.  If she is looking straight she does not have it.  She will follow-up with ENT outpt.     FINAL CLINICAL IMPRESSION(S) / ED DIAGNOSES   Final diagnoses:  Vertigo     Rx / DC Orders   ED Discharge Orders          Ordered    meclizine (ANTIVERT) 25 MG tablet  3 times daily PRN        05/28/22 1402             Note:  This document was prepared using Dragon voice recognition software and may include unintentional dictation errors.   Vanessa Waterview, MD 05/28/22 [phone]

## 2022-05-28 NOTE — ED Triage Notes (Signed)
Pt to ED via [place]. Pt was seen at [facility] for dizziness that started yesterday. Pt did not take BP meds this morning. Pt also endorses HA and N/V

## 2022-05-28 NOTE — ED Notes (Signed)
Patient transported to MRI 

## 2022-05-28 NOTE — Discharge Instructions (Addendum)
Your workup was negative for stroke.  Follow up with CT below with PCP. Take medication to help with dizziness. Call ENT to get a follow up. Have someone with you over the next few days to help prevent falls.   Return for fever, worsening symptoms any other concerns.   IMPRESSION:  1. No acute intracranial pathology.  2. Patent vasculature of the head and neck. Calcified plaque of the  carotid bifurcations resulting in approximately 30-40% stenosis  bilaterally. No other significant stenosis in the head or neck.

## 2022-06-01 ENCOUNTER — Telehealth: Payer: Self-pay

## 2022-06-01 ENCOUNTER — Telehealth: Payer: Self-pay | Admitting: Family Medicine

## 2022-06-01 ENCOUNTER — Telehealth: Payer: Self-pay | Admitting: Nurse Practitioner

## 2022-06-01 NOTE — Telephone Encounter (Signed)
Pt called stating pt son picked up her medication on yesterday and there was repatha with the medication. Pt did not know if she need to take the repatha medication also

## 2022-06-01 NOTE — Telephone Encounter (Signed)
Pt c/o medication issue:  1. Name of Medication: Evolocumab (REPATHA SURECLICK) 494 MG/ML SOAJ   2. How are you currently taking this medication (dosage and times per day)?  Inject 140 mg into the skin every 14 (fourteen) days.  3. Are you having a reaction (difficulty breathing--STAT)? No   4. What is your medication issue? Patient want to know where do she inject at. Please call back to discuss

## 2022-06-01 NOTE — Telephone Encounter (Signed)
Called the patient and gave her advice on where to do the injection. Nothing further needed.

## 2022-06-01 NOTE — Telephone Encounter (Signed)
Spoke with pt and let her know that Repatha was not prescribed by Dr. Caryl Bis but by Murray Hodgkins, NP.  Pt was bothered because she was not made aware of the new medication. Encouraged pt to call [facility] office for more details about Repatha.  Pt verbalized understanding.

## 2022-06-01 NOTE — Telephone Encounter (Signed)
Transition Care Management Follow-up Telephone Call Date of discharge and from where: 05/28/2022 [facility] ED How have you been since you were released from the hospital? Pt continues to have dizziness Any questions or concerns? No  Items Reviewed: Did the pt receive and understand the discharge instructions provided? Yes  Medications obtained and verified? Yes  Other? No  Any new allergies since your discharge? No  Dietary orders reviewed? Yes Do you have support at home? Yes   Home Care and Equipment/Supplies: Were home health services ordered? not applicable If so, what is the name of the agency? NA  Has the agency set up a time to come to the patient's home? not applicable Were any new equipment or medical supplies ordered?  NA What is the name of the medical supply agency? NA Were you able to get the supplies/equipment? not applicable Do you have any questions related to the use of the equipment or supplies? NA  Functional Questionnaire: (I = Independent and D = Dependent) ADLs: I  Bathing/Dressing- I  Meal Prep- I  Eating- I  Maintaining continence- I  Transferring/Ambulation- I  Managing Meds- I  Follow up appointments reviewed:  PCP Hospital f/u appt confirmed? Yes  Scheduled to see Dr. Caryl Bis on 06/03/22 @ [facility] f/u appt confirmed? NA Are transportation arrangements needed? No  If their condition worsens, is the pt aware to call PCP or go to the Emergency Dept.? Yes Was the patient provided with contact information for the PCP's office or ED? Yes Was to pt encouraged to call back with questions or concerns? Yes

## 2022-06-02 ENCOUNTER — Telehealth: Payer: Self-pay | Admitting: Internal Medicine

## 2022-06-02 NOTE — Telephone Encounter (Signed)
Nurse informed pt to stop by the office today and nurse will help with first injection.

## 2022-06-02 NOTE — Telephone Encounter (Signed)
Pt c/o medication issue:  1. Name of Medication:   Evolocumab (REPATHA SURECLICK) 324 MG/ML SOAJ     3. Are you having a reaction (difficulty breathing--STAT)? No  4. What is your medication issue? Pt states that she is unable to give herself injection. She would like to know if she is able to come into office to get help. Please advise

## 2022-06-02 NOTE — Telephone Encounter (Signed)
Nurse demonstrated the use of repatha with demo pen and monitored pt self administer injection.

## 2022-06-03 ENCOUNTER — Ambulatory Visit: Payer: Medicare PPO | Admitting: Family Medicine

## 2022-06-03 ENCOUNTER — Encounter: Payer: Self-pay | Admitting: Family Medicine

## 2022-06-03 VITALS — BP 120/74 | HR 70 | Temp 98.1°F | Ht 62.0 in | Wt 150.3 lb

## 2022-06-03 DIAGNOSIS — H8110 Benign paroxysmal vertigo, unspecified ear: Secondary | ICD-10-CM | POA: Diagnosis not present

## 2022-06-03 DIAGNOSIS — I10 Essential (primary) hypertension: Secondary | ICD-10-CM | POA: Diagnosis not present

## 2022-06-03 DIAGNOSIS — H6121 Impacted cerumen, right ear: Secondary | ICD-10-CM

## 2022-06-03 DIAGNOSIS — R3129 Other microscopic hematuria: Secondary | ICD-10-CM

## 2022-06-03 NOTE — Assessment & Plan Note (Signed)
Symptoms are likely related to BPPV based on her description and benign workup in the emergency department.  Discussed modified Epley maneuver at home.  If not improving she will let us know.  At that point we could refer her for vestibular rehab.  She will try to make an appointment with ENT.  If her symptoms resolve before seeing ENT advised that she did not necessarily have to see them.  She can continue meclizine and she will monitor for drowsiness with that medication.

## 2022-06-03 NOTE — Patient Instructions (Addendum)
Nice to see you. Please try the modified Epley maneuver to see if that helps with your dizziness.  If it is not improving over the next week with this please let me know.  How to Perform the Epley Maneuver The Epley maneuver is an exercise that relieves symptoms of vertigo. Vertigo is the feeling that you or your surroundings are moving when they are not. When you feel vertigo, you may feel like the room is spinning and may have trouble walking. The Epley maneuver is used for a type of vertigo caused by a calcium deposit in a part of the inner ear. The maneuver involves changing head positions to help the deposit move out of the area. You can do this maneuver at home whenever you have symptoms of vertigo. You can repeat it in 24 hours if your vertigo has not gone away. Even though the Epley maneuver may relieve your vertigo for a few weeks, it is possible that your symptoms will return. This maneuver relieves vertigo, but it does not relieve dizziness. What are the risks? If it is done correctly, the Epley maneuver is considered safe. Sometimes it can lead to dizziness or nausea that goes away after a short time. If you develop other symptoms--such as changes in vision, weakness, or numbness--stop doing the maneuver and call your health care provider. Supplies needed: A bed or table. A pillow. How to do the Epley maneuver     Sit on the edge of a bed or table with your back straight and your legs extended or hanging over the edge of the bed or table. Turn your head halfway toward the affected ear or side as told by your health care provider. Lie backward quickly with your head turned until you are lying flat on your back. Your head should dangle (head-hanging position). You may want to position a pillow under your shoulders. Hold this position for at least 30 seconds. If you feel dizzy or have symptoms of vertigo, continue to hold the position until the symptoms stop. Turn your head to the  opposite direction until your unaffected ear is facing down. Your head should continue to dangle. Hold this position for at least 30 seconds. If you feel dizzy or have symptoms of vertigo, continue to hold the position until the symptoms stop. Turn your whole body to the same side as your head so that you are positioned on your side. Your head will now be nearly facedown and no longer needs to dangle. Hold for at least 30 seconds. If you feel dizzy or have symptoms of vertigo, continue to hold the position until the symptoms stop. Sit back up. You can repeat the maneuver in 24 hours if your vertigo does not go away. Follow these instructions at home: For 24 hours after doing the Epley maneuver: Keep your head in an upright position. When lying down to sleep or rest, keep your head raised (elevated) with two or more pillows. Avoid excessive neck movements. Activity Do not drive or use machinery if you feel dizzy. After doing the Epley maneuver, return to your normal activities as told by your health care provider. Ask your health care provider what activities are safe for you. General instructions Drink enough fluid to keep your urine pale yellow. Do not drink alcohol. Take over-the-counter and prescription medicines only as told by your health care provider. Keep all follow-up visits. This is important. Preventing vertigo symptoms Ask your health care provider if there is anything you should do  at home to prevent vertigo. He or she may recommend that you: Keep your head elevated with two or more pillows while you sleep. Do not sleep on the side of your affected ear. Get up slowly from bed. Avoid sudden movements during the day. Avoid extreme head positions or movement, such as looking up or bending over. Contact a health care provider if: Your vertigo gets worse. You have other symptoms, including: Nausea. Vomiting. Headache. Get help right away if you: Have vision changes. Have a  headache or neck pain that is severe or getting worse. Cannot stop vomiting. Have new numbness or weakness in any part of your body. These symptoms may represent a serious problem that is an emergency. Do not wait to see if the symptoms will go away. Get medical help right away. Call your local emergency services (911 in the [place] Do not drive yourself to the hospital. Summary Vertigo is the feeling that you or your surroundings are moving when they are not. The Epley maneuver is an exercise that relieves symptoms of vertigo. If the Epley maneuver is done correctly, it is considered safe. This information is not intended to replace advice given to you by your health care provider. Make sure you discuss any questions you have with your health care provider. Document Revised: 05/21/2020 Document Reviewed: 05/21/2020 Elsevier Patient Education  [place].

## 2022-06-03 NOTE — Assessment & Plan Note (Signed)
Refer to urology for workup.

## 2022-06-03 NOTE — Assessment & Plan Note (Signed)
Adequately controlled for age.  She will continue losartan 50 mg twice daily and carvedilol 12.5 mg twice daily.

## 2022-06-03 NOTE — Assessment & Plan Note (Signed)
Irrigated as outlined in the note.

## 2022-06-03 NOTE — Progress Notes (Signed)
Kelsey Rumps, MD Phone: [phone]  Kelsey Mason is a 81 y.o. female who presents today for follow-up.  Vertigo: Patient was seen in the ED on 05/28/2022.  She developed dizziness on 05/27/2022.  She noted the dizziness was worse with moving her neck.  In the ED she denied trauma to the neck or recent accidents.  She had an extensive workup that did not reveal a cause for her symptoms.  She notes her symptoms have improved some though when she lays down or sits up and moves her head she will have some dizziness that last for a minute or 2 and then improves.  She notes no hearing difficulty.  No change in chronic tinnitus.  No ear fullness.  She reports ENT is not able to see her for another 4 weeks.  Meclizine has been beneficial.  Hypertension: Typically around 140/70.  She is taking carvedilol, losartan.  Microscopic hematuria: Patient notes no gross hematuria.  No dysuria.  No frequency.  No urgency.  This was noted on urinalysis at the emergency department.  Social History   Tobacco Use  Smoking Status Former   Packs/day: 0.25   Years: 1.00   Total pack years: 0.25   Types: Cigarettes   Quit date: 1963   Years since quitting: 60.9  Smokeless Tobacco Never    Current Outpatient Medications on File Prior to Visit  Medication Sig Dispense Refill   albuterol (VENTOLIN HFA) 108 (90 Base) MCG/ACT inhaler TAKE 2 PUFFS BY MOUTH EVERY 6 HOURS AS NEEDED FOR WHEEZE OR SHORTNESS OF BREATH 18 each 11   aspirin 81 MG tablet Take 81 mg by mouth daily.     Bempedoic Acid-Ezetimibe (NEXLIZET) 180-10 MG TABS Take 1 tablet by mouth daily. 30 tablet 6   blood glucose meter kit and supplies KIT Dispense based on patient and insurance preference. Use up to four times daily as directed. One Touch Ultra mini meter (FOR ICD-9 250.00, 250.01). Dx: E11.9 1 each 0   carvedilol (COREG) 12.5 MG tablet TAKE 1 TABLET BY MOUTH TWICE A DAY 180 tablet 0   cholecalciferol (VITAMIN D) 25 MCG (1000 UT)  tablet Take 1,000 Units by mouth daily.     dapagliflozin propanediol (FARXIGA) 10 MG TABS tablet Take 10 mg by mouth daily.     Evolocumab (REPATHA SURECLICK) 154 MG/ML SOAJ Inject 140 mg into the skin every 14 (fourteen) days. 2 mL 6   furosemide (LASIX) 20 MG tablet TAKE 1 TABLET (20 MG TOTAL) BY MOUTH DAILY AS NEEDED (FOR >2LB WEIGHT GAIN OVERNIGHT OR 5 LB WEIGHT GAIN IN 1 WEEK OR SWELLING.). 90 tablet 3   glucose blood (ONE TOUCH ULTRA TEST) test strip USE TO TEST BLOOD SUGAR UP TO 4 TIMES DAILY 100 each 6   insulin glargine (LANTUS SOLOSTAR) 100 UNIT/ML Solostar Pen Inject 30 Units into the skin daily. 15 mL 0   losartan (COZAAR) 100 MG tablet TAKE 1/2 TABLET (50 MG TOTAL) BY MOUTH 2 (TWO) TIMES DAILY. 90 tablet 1   meclizine (ANTIVERT) 25 MG tablet Take 0.5 tablets (12.5 mg total) by mouth 3 (three) times daily as needed for up to 10 days for dizziness. 15 tablet 0   nitroGLYCERIN (NITROSTAT) 0.4 MG SL tablet PLACE 1 TABLET (0.4 MG TOTAL) UNDER THE TONGUE EVERY 5 (FIVE) MINUTES AS NEEDED FOR CHEST PAIN. MAXIMUM OF 3 DOSES. 25 tablet 3   NOVOFINE PEN NEEDLE 32G X 6 MM MISC USE AS DIRECTED WITH LANTUS PEN 100 each 2  TRULICITY 1.5 MK/3.4JZ SOPN Inject 1.5 mg as directed once a week.  11   vitamin B-12 (CYANOCOBALAMIN) 1000 MCG tablet Take 1,000 mcg by mouth daily.     No current facility-administered medications on file prior to visit.     ROS see history of present illness  Objective  Physical Exam Vitals:   06/03/22 1115  BP: 120/74  Pulse: 70  Temp: 98.1 F (36.7 C)  SpO2: 99%    BP Readings from Last 3 Encounters:  06/03/22 120/74  05/28/22 (!) 160/70  05/05/22 120/78   Wt Readings from Last 3 Encounters:  06/03/22 150 lb 4.8 oz (68.2 kg)  05/28/22 152 lb 9.6 oz (69.2 kg)  05/05/22 155 lb (70.3 kg)    Physical Exam Constitutional:      General: She is not in acute distress.    Appearance: She is not diaphoretic.  HENT:     Left Ear: Tympanic membrane normal.      Ears:     Comments: Right TM obscured by cerumen Cardiovascular:     Rate and Rhythm: Normal rate and regular rhythm.     Heart sounds: Normal heart sounds.  Pulmonary:     Effort: Pulmonary effort is normal.     Breath sounds: Normal breath sounds.  Skin:    General: Skin is warm and dry.  Neurological:     Mental Status: She is alert.    Right ear canal was irrigated by CMA, the patient had some discomfort towards the end of the irrigation so this was discontinued, the wax in her ear was softened by the irrigation though was still present, patient was advised to try the Debrox over-the-counter and if not beneficial by the time she sees ENT they may be able to remove any wax that is present  Assessment/Plan: Please see individual problem list.  Problem List Items Addressed This Visit     Essential hypertension - Primary (Chronic)    Adequately controlled for age.  She will continue losartan 50 mg twice daily and carvedilol 12.5 mg twice daily.      BPPV (benign paroxysmal positional vertigo)    Symptoms are likely related to BPPV based on her description and benign workup in the emergency department.  Discussed modified Epley maneuver at home.  If not improving she will let us know.  At that point we could refer her for vestibular rehab.  She will try to make an appointment with ENT.  If her symptoms resolve before seeing ENT advised that she did not necessarily have to see them.  She can continue meclizine and she will monitor for drowsiness with that medication.      Cerumen impaction    Irrigated as outlined in the note.      Microscopic hematuria    Refer to urology for workup.      Relevant Orders   Ambulatory referral to Urology     Return in about 3 months (around 09/02/2022) for Follow-up December appointment rescheduled.   Kelsey Rumps, MD [place]

## 2022-06-07 ENCOUNTER — Telehealth: Payer: Self-pay | Admitting: Family Medicine

## 2022-06-07 NOTE — Telephone Encounter (Signed)
Pt need a refill on meclizine sent to [facility]

## 2022-06-08 ENCOUNTER — Ambulatory Visit (INDEPENDENT_AMBULATORY_CARE_PROVIDER_SITE_OTHER): Payer: Medicare PPO

## 2022-06-08 VITALS — Ht 62.0 in | Wt 150.0 lb

## 2022-06-08 DIAGNOSIS — Z0000 Encounter for general adult medical examination without abnormal findings: Secondary | ICD-10-CM | POA: Diagnosis not present

## 2022-06-08 NOTE — Patient Instructions (Addendum)
Kelsey Mason , Thank you for taking time to come for your Medicare Wellness Visit. I appreciate your ongoing commitment to your health goals. Please review the following plan we discussed and let me know if I can assist you in the future.   These are the goals we discussed:  Goals       Patient Stated     I would like to lose 20-30lbs (pt-stated)      Portion control Reduce sugar intake Dancing weekly        This is a list of the screening recommended for you and due dates:  Health Maintenance  Topic Date Due   DTaP/Tdap/Td vaccine (1 - Tdap) Never done   COVID-19 Vaccine (8 - 2023-24 season) 06/24/2022*   Eye exam for diabetics  06/09/2022   Complete foot exam   09/03/2022   Hemoglobin A1C  09/17/2022   Yearly kidney health urinalysis for diabetes  04/23/2023   Yearly kidney function blood test for diabetes  05/29/2023   Medicare Annual Wellness Visit  06/09/2023   Flu Shot  Completed   DEXA scan (bone density measurement)  Completed   HPV Vaccine  Aged Out   Pneumonia Vaccine  Discontinued   Zoster (Shingles) Vaccine  Discontinued  *Topic was postponed. The date shown is not the original due date.    Advanced directives: on file  Conditions/risks identified: none new  Next appointment: Follow up in one year for your annual wellness visit    Preventive Care 65 Years and Older, Female Preventive care refers to lifestyle choices and visits with your health care provider that can promote health and wellness. What does preventive care include? A yearly physical exam. This is also called an annual well check. Dental exams once or twice a year. Routine eye exams. Ask your health care provider how often you should have your eyes checked. Personal lifestyle choices, including: Daily care of your teeth and gums. Regular physical activity. Eating a healthy diet. Avoiding tobacco and drug use. Limiting alcohol use. Practicing safe sex. Taking low-dose aspirin every  day. Taking vitamin and mineral supplements as recommended by your health care provider. What happens during an annual well check? The services and screenings done by your health care provider during your annual well check will depend on your age, overall health, lifestyle risk factors, and family history of disease. Counseling  Your health care provider may ask you questions about your: Alcohol use. Tobacco use. Drug use. Emotional well-being. Home and relationship well-being. Sexual activity. Eating habits. History of falls. Memory and ability to understand (cognition). Work and work Statistician. Reproductive health. Screening  You may have the following tests or measurements: Height, weight, and BMI. Blood pressure. Lipid and cholesterol levels. These may be checked every 5 years, or more frequently if you are over 62 years old. Skin check. Lung cancer screening. You may have this screening every year starting at age 13 if you have a 30-pack-year history of smoking and currently smoke or have quit within the past 15 years. Fecal occult blood test (FOBT) of the stool. You may have this test every year starting at age 73. Flexible sigmoidoscopy or colonoscopy. You may have a sigmoidoscopy every 5 years or a colonoscopy every 10 years starting at age 5. Hepatitis C blood test. Hepatitis B blood test. Sexually transmitted disease (STD) testing. Diabetes screening. This is done by checking your blood sugar (glucose) after you have not eaten for a while (fasting). You may have this done  every 1-3 years. Bone density scan. This is done to screen for osteoporosis. You may have this done starting at age 41. Mammogram. This may be done every 1-2 years. Talk to your health care provider about how often you should have regular mammograms. Talk with your health care provider about your test results, treatment options, and if necessary, the need for more tests. Vaccines  Your health care  provider may recommend certain vaccines, such as: Influenza vaccine. This is recommended every year. Tetanus, diphtheria, and acellular pertussis (Tdap, Td) vaccine. You may need a Td booster every 10 years. Zoster vaccine. You may need this after age 7. Pneumococcal 13-valent conjugate (PCV13) vaccine. One dose is recommended after age 16. Pneumococcal polysaccharide (PPSV23) vaccine. One dose is recommended after age 75. Talk to your health care provider about which screenings and vaccines you need and how often you need them. This information is not intended to replace advice given to you by your health care provider. Make sure you discuss any questions you have with your health care provider. Document Released: 07/18/2015 Document Revised: 03/10/2016 Document Reviewed: 04/22/2015 Elsevier Interactive Patient Education  2017 [place] Prevention in the Home Falls can cause injuries. They can happen to people of all ages. There are many things you can do to make your home safe and to help prevent falls. What can I do on the outside of my home? Regularly fix the edges of walkways and driveways and fix any cracks. Remove anything that might make you trip as you walk through a door, such as a raised step or threshold. Trim any bushes or trees on the path to your home. Use bright outdoor lighting. Clear any walking paths of anything that might make someone trip, such as rocks or tools. Regularly check to see if handrails are loose or broken. Make sure that both sides of any steps have handrails. Any raised decks and porches should have guardrails on the edges. Have any leaves, snow, or ice cleared regularly. Use sand or salt on walking paths during winter. Clean up any spills in your garage right away. This includes oil or grease spills. What can I do in the bathroom? Use night lights. Install grab bars by the toilet and in the tub and shower. Do not use towel bars as grab  bars. Use non-skid mats or decals in the tub or shower. If you need to sit down in the shower, use a plastic, non-slip stool. Keep the floor dry. Clean up any water that spills on the floor as soon as it happens. Remove soap buildup in the tub or shower regularly. Attach bath mats securely with double-sided non-slip rug tape. Do not have throw rugs and other things on the floor that can make you trip. What can I do in the bedroom? Use night lights. Make sure that you have a light by your bed that is easy to reach. Do not use any sheets or blankets that are too big for your bed. They should not hang down onto the floor. Have a firm chair that has side arms. You can use this for support while you get dressed. Do not have throw rugs and other things on the floor that can make you trip. What can I do in the kitchen? Clean up any spills right away. Avoid walking on wet floors. Keep items that you use a lot in easy-to-reach places. If you need to reach something above you, use a strong step stool that has  a grab bar. Keep electrical cords out of the way. Do not use floor polish or wax that makes floors slippery. If you must use wax, use non-skid floor wax. Do not have throw rugs and other things on the floor that can make you trip. What can I do with my stairs? Do not leave any items on the stairs. Make sure that there are handrails on both sides of the stairs and use them. Fix handrails that are broken or loose. Make sure that handrails are as long as the stairways. Check any carpeting to make sure that it is firmly attached to the stairs. Fix any carpet that is loose or worn. Avoid having throw rugs at the top or bottom of the stairs. If you do have throw rugs, attach them to the floor with carpet tape. Make sure that you have a light switch at the top of the stairs and the bottom of the stairs. If you do not have them, ask someone to add them for you. What else can I do to help prevent  falls? Wear shoes that: Do not have high heels. Have rubber bottoms. Are comfortable and fit you well. Are closed at the toe. Do not wear sandals. If you use a stepladder: Make sure that it is fully opened. Do not climb a closed stepladder. Make sure that both sides of the stepladder are locked into place. Ask someone to hold it for you, if possible. Clearly mark and make sure that you can see: Any grab bars or handrails. First and last steps. Where the edge of each step is. Use tools that help you move around (mobility aids) if they are needed. These include: Canes. Walkers. Scooters. Crutches. Turn on the lights when you go into a dark area. Replace any light bulbs as soon as they burn out. Set up your furniture so you have a clear path. Avoid moving your furniture around. If any of your floors are uneven, fix them. If there are any pets around you, be aware of where they are. Review your medicines with your doctor. Some medicines can make you feel dizzy. This can increase your chance of falling. Ask your doctor what other things that you can do to help prevent falls. This information is not intended to replace advice given to you by your health care provider. Make sure you discuss any questions you have with your health care provider. Document Released: 04/17/2009 Document Revised: 11/27/2015 Document Reviewed: 07/26/2014 Elsevier Interactive Patient Education  2017 [employer].

## 2022-06-08 NOTE — Progress Notes (Signed)
Subjective:   Kelsey Mason is a 81 y.o. female who presents for Medicare Annual (Subsequent) preventive examination.  Review of Systems    No ROS.  Medicare Wellness Virtual Visit.  Visual/audio telehealth visit, UTA vital signs.   See social history for additional risk factors.   Cardiac Risk Factors include: advanced age (>5mn, >>67women)     Objective:    Today's Vitals   06/08/22 1237  Weight: 150 lb (68 kg)  Height: _0  (1.575 m)   Body mass index is 27.44 kg/m.     06/08/2022   12:43 PM 05/28/2022    9:52 AM 11/13/2021   10:16 AM 10/05/2021    8:50 AM 06/17/2021    3:13 PM 06/02/2021    3:37 PM 11/14/2020    1:14 PM  Advanced Directives  Does Patient Have a Medical Advance Directive? Yes No _1   Type of [place]aramed[place]of [place]  [place]  Living will;Healthcare Power of [place] Living will;Healthcare Power of Attorney  Does patient want to make changes to medical advance directive? No - Patient declined   Yes (ED - Information included in AVS) Yes (ED - Information included in AVS) No - Patient declined   Copy of [place] Chart? Yes - validated most recent copy scanned in chart (See row information)     No - copy requested   Would patient like information on creating a medical advance directive?  No - Patient declined   Yes (ED - Information included in AVS)      Current Medications (verified) Outpatient Encounter Medications as of 06/08/2022  Medication Sig   albuterol (VENTOLIN HFA) 108 (90 Base) MCG/ACT inhaler TAKE 2 PUFFS BY MOUTH EVERY 6 HOURS AS NEEDED FOR WHEEZE OR SHORTNESS OF BREATH   aspirin 81 MG tablet Take 81 mg by mouth daily.   Bempedoic Acid-Ezetimibe (NEXLIZET) 180-10 MG TABS Take 1 tablet by mouth daily.   blood glucose meter kit and supplies KIT Dispense based on patient and insurance  preference. Use up to four times daily as directed. One Touch Ultra mini meter (FOR ICD-9 250.00, 250.01). Dx: E11.9   carvedilol (COREG) 12.5 MG tablet TAKE 1 TABLET BY MOUTH TWICE A DAY   cholecalciferol (VITAMIN D) 25 MCG (1000 UT) tablet Take 1,000 Units by mouth daily.   dapagliflozin propanediol (FARXIGA) 10 MG TABS tablet Take 10 mg by mouth daily.   Evolocumab (REPATHA SURECLICK) 1315MG/ML SOAJ Inject 140 mg into the skin every 14 (fourteen) days.   furosemide (LASIX) 20 MG tablet TAKE 1 TABLET (20 MG TOTAL) BY MOUTH DAILY AS NEEDED (FOR >2LB WEIGHT GAIN OVERNIGHT OR 5 LB WEIGHT GAIN IN 1 WEEK OR SWELLING.).   glucose blood (ONE TOUCH ULTRA TEST) test strip USE TO TEST BLOOD SUGAR UP TO 4 TIMES DAILY   insulin glargine (LANTUS SOLOSTAR) 100 UNIT/ML Solostar Pen Inject 30 Units into the skin daily.   losartan (COZAAR) 100 MG tablet TAKE 1/2 TABLET (50 MG TOTAL) BY MOUTH 2 (TWO) TIMES DAILY.   nitroGLYCERIN (NITROSTAT) 0.4 MG SL tablet PLACE 1 TABLET (0.4 MG TOTAL) UNDER THE TONGUE EVERY 5 (FIVE) MINUTES AS NEEDED FOR CHEST PAIN. MAXIMUM OF 3 DOSES.   NOVOFINE PEN NEEDLE 32G X 6 MM MISC USE AS DIRECTED WITH LANTUS PEN   TRULICITY 1.5 MQM/0.8QPSOPN Inject 1.5 mg as directed once a week.  vitamin B-12 (CYANOCOBALAMIN) 1000 MCG tablet Take 1,000 mcg by mouth daily.   No facility-administered encounter medications on file as of 06/08/2022.    Allergies (verified) Prednisone, Advil [ibuprofen], Aleve [naproxen], Fluticasone-salmeterol, Lipitor [atorvastatin], Naproxen sodium, and Penicillins   History: Past Medical History:  Diagnosis Date   Arthritis    fingers   Breast symptom 2014   Cancer [facility] Xxx) 2014   Bilateral Breast, chemo Dr. Jeb Levering   Chronic HFmrEF (heart failure with mid-range ejection fraction) ([place])    a. 01/2017 Echo: EF 50-55%; b. 03/2018 Echo: EF 40-45%; c. 01/2020 Echo: EF 40%, glob HK, mild LVH, GrI DD.   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 01/2020 mild-mod MR.   NICM (nonischemic cardiomyopathy) ([place])    a. 01/2017 Echo: EF 50-55%; b. 03/2018 Echo: EF 40-45%; c. 01/2020 Echo: EF 40%, glob HK, mild LVH, GrI DD, nl RV fxn, mildly dil LA, mild to mod MR.   Nonobstructive CAD (coronary artery disease)    a. 06/2016 MV: low risk; b. 04/2018 Cath: LM nl, LAD 44m D1/2/3 nl, LCX nl, OM3 55ost Ca2+, RCA 50ost Ca2+.   PAD (peripheral artery disease) [place]    a. 04/2018 Lower ext angio: L pop 327mR AT 100, R Peroneal 100d.   Personal history of chemotherapy    Personal history of radiation therapy 2014   bilat lumpectomy   Past Surgical History:  Procedure Laterality Date   ABDOMINAL AORTOGRAM W/LOWER EXTREMITY N/A 04/21/2018   Procedure: ABDOMINAL AORTOGRAM W/LOWER EXTREMITY;  Surgeon: EnNelva BushMD;  [place] [facility];  Service: Cardiovascular;  Laterality: N/A;   ABDOMINAL HYSTERECTOMY  1976   for pelvic pain   BREAST EXCISIONAL BIOPSY Bilateral 11/2012   bilat breast ca chemo and rad   BREAST SURGERY Bilateral 2014   bilateral lumpectomy and SN biopsy   CATARACT EXTRACTION W/PHACO Left 07/30/2020   Procedure: CATARACT EXTRACTION PHACO AND INTRAOCULAR LENS PLACEMENT (IOC) LEFT DIABETIC 18.73 01:45.0 17.8%;  Surgeon: BrLeandrew KoyanagiMD;  [place] Service: Ophthalmology;  Laterality: Left;  Diabetic - insulin and opral meds   CATARACT EXTRACTION W/PHACO Right 08/13/2020   Procedure: CATARACT EXTRACTION PHACO AND INTRAOCULAR LENS PLACEMENT ([place] DIABETIC;  Surgeon: BrLeandrew KoyanagiMD;  [place] Service: Ophthalmology;  Laterality: Right;  10.67 1:27.8 12.1%   CHOLECYSTECTOMY  2007   COLONOSCOPY WITH PROPOFOL N/A 10/13/2015   Procedure: COLONOSCOPY WITH PROPOFOL;  Surgeon: RoManya SilvasMD;  [facility];  Service: Endoscopy;  Laterality: N/A;   LEFT HEART CATH AND CORONARY ANGIOGRAPHY N/A 04/21/2018   Procedure: LEFT HEART CATH AND CORONARY ANGIOGRAPHY;  Surgeon: EnNelva BushMD;  [place] [facility];  Service: Cardiovascular;  Laterality: N/A;   OOPHORECTOMY Right    PORTACATH PLACEMENT  2014   TUMOR REMOVAL Right 1980   right foot   Family History  Problem Relation Age of Onset   Alcohol abuse Mother    Heart disease Mother    Hypertension Mother    Breast cancer Mother [id number] Alcohol abuse Father    Heart disease Father    Hypertension Father    Heart disease Brother    Heart attack Brother    Social History   Socioeconomic  History   Marital status: Widowed    Spouse name: Not on file   Number of children: Not on file   Years of education: Not on file   Highest education level: Not on file  Occupational History   Not on file  Tobacco Use   Smoking status: Former    Packs/day: 0.25    Years: 1.00    Total pack years: 0.25    Types: Cigarettes    Quit date: 1963    Years since quitting: 60.9   Smokeless tobacco: Never  Vaping Use   Vaping Use: Never used  Substance and Sexual Activity   Alcohol use: No    Alcohol/week: 0.0 standard drinks of alcohol   Drug use: No   Sexual activity: Never  Other Topics Concern   Not on file  Social History Narrative   Lives in [place].       Works - [employer]   Diet - regular   Exercise - dance 3x per week   Social Determinants of Health   Financial Resource Strain: Low Risk  (06/08/2022)   Overall Financial Resource Strain (CARDIA)    Difficulty of Paying Living Expenses: Not hard at all  Food Insecurity: No Food Insecurity (06/08/2022)   Hunger Vital Sign    Worried About Running Out of Food in the Last Year: Never true    Ran Out of Food in the Last Year: Never true  Transportation Needs: No Transportation Needs (06/08/2022)   PRAPARE - Hydrologist  (Medical): No    Lack of Transportation (Non-Medical): No  Physical Activity: Sufficiently Active (06/08/2022)   Exercise Vital Sign    Days of Exercise per Week: 2 days    Minutes of Exercise per Session: 150+ min  Stress: No Stress Concern Present (06/08/2022)   [place]    Feeling of Stress : Not at all  Social Connections: Unknown (06/08/2022)   Social Connection and Isolation Panel [NHANES]    Frequency of Communication with Friends and Family: More than three times a week    Frequency of Social Gatherings with Friends and Family: Twice a week    Attends Religious Services: Not on Advertising copywriter or Organizations: Not on file    Attends Archivist Meetings: Not on file    Marital Status: Not on file    Tobacco Counseling Counseling given: Not Answered   Clinical Intake:  Pre-visit preparation completed: Yes        Diabetes: Yes (Followed by Endocrinology, Dr. Gabriel Carina.)  How often do you need to have someone help you when you read instructions, pamphlets, or other written materials from your doctor or pharmacy?: 1 - Never  Nutrition Risk Assessment: Does the patient have any non-healing wounds?  No  Has the patient had any unintentional weight loss or weight gain?  No      Activities of Daily Living    06/08/2022   12:40 PM  In your present state of health, do you have any difficulty performing the following activities:  Hearing? 0  Vision? 0  Difficulty concentrating or making decisions? 0  Walking or climbing stairs? 1  Comment Paces self with activity  Dressing or bathing? 0  Doing errands, shopping? 0  Preparing Food and eating ? N  Using the Toilet? N  In the past six months, have you accidently leaked urine? N  Do  you have problems with loss of bowel control? N  Managing your Medications? N  Managing your Finances? N  Housekeeping or managing your Housekeeping?  N    Patient Care Team: Leone Haven, MD as PCP - General (Family Medicine) End, Harrell Gave, MD as PCP - Cardiology (Cardiology) Christene Lye, MD as Consulting Physician (General Surgery) Gabriel Carina Betsey Holiday, MD as Physician Assistant (Endocrinology)  Indicate any recent Medical Services you may have received from other than [facility] providers in the past year (date may be approximate).     Assessment:   This is a routine wellness examination for Myda.  I connected with  Sheppard Plumber on 06/08/22 by a audio enabled telemedicine application and verified that I am speaking with the correct person using two identifiers.  Patient Location: Home  Provider Location: Office/Clinic  I discussed the limitations of evaluation and management by telemedicine. The patient expressed understanding and agreed to proceed.   Hearing/Vision screen Hearing Screening - Comments:: Patient is able to hear conversational tones without difficulty.  No issues reported.   Vision Screening - Comments:: Followed by [facility]  Cataract extraction, bilateral  Annual visits  Wears reading glasses  Dietary issues and exercise activities discussed: Current Exercise Habits: Home exercise routine, Time (Minutes): > 60, Frequency (Times/Week): 1, Weekly Exercise (Minutes/Week): 0, Intensity: Mild   Goals Addressed   None    Depression Screen    06/08/2022   12:42 PM 06/03/2022   11:17 AM 03/19/2022    9:48 AM 12/04/2021    7:59 AM 08/07/2021    1:47 PM 06/02/2021    3:34 PM 08/05/2020    9:42 AM  PHQ 2/9 Scores  PHQ - 2 Score 0 0 0 0 0 0 0    Fall Risk    06/08/2022   12:42 PM 06/03/2022   11:17 AM 03/19/2022    9:48 AM 08/07/2021    1:46 PM 06/02/2021    3:39 PM  [place] in the past year? 0 0 0 0 0  Number falls in past yr: 0 0 0 0 0  Injury with Fall? 0 0 0 0   Risk for fall due to :  No Fall Risks No Fall Risks No Fall Risks   Follow up Falls evaluation  completed;Falls prevention discussed Falls evaluation completed Falls evaluation completed Falls evaluation completed Falls evaluation completed    FALL RISK PREVENTION PERTAINING TO THE HOME: Home free of loose throw rugs in walkways, pet beds, electrical cords, etc? Yes  Adequate lighting in your home to reduce risk of falls? Yes   ASSISTIVE DEVICES UTILIZED TO PREVENT FALLS: Life alert? No  Use of a cane, walker or w/c? Yes, as needed Grab bars in the bathroom? No  Shower chair or bench in shower? No  Elevated toilet seat or a handicapped toilet? No  BSC? Yes, as needed  TIMED UP AND GO: Was the test performed? No .   Cognitive Function:    04/11/2018   10:32 AM 04/08/2017   10:49 AM 01/30/2016    7:54 AM  MMSE - Mini Mental State Exam  Orientation to time _0 Orientation to time comments Asked to draw a clock face of time 11:10. Time shown 11:50. difficulty reading the face of a clock correctly   Orientation to Place _1 Registration _2 Attention/ Calculation _3 Attention/Calculation-comments Asked to subtract by 3's Difficulty performing simple  calculations   Recall _0 Language- name 2 objects _1 Language- repeat _2 Language- follow 3 step command _3 Language- read & follow direction _4 Write a sentence _5 Copy design _6 Total score _7 04/17/2019   10:21 AM  6CIT Screen  What Year? 0 points  What month? 0 points  What time? 0 points  Count back from 20 0 points  Months in reverse 0 points  Repeat phrase 0 points  Total Score 0 points    Immunizations Immunization History  Administered Date(s) Administered   Fluad Quad(high Dose 65+) 04/26/2019, 02/02/2021, 03/19/2022   Influenza Whole 03/26/2012, 04/10/2013   Influenza, High Dose Seasonal PF 06/03/2016, 03/24/2017, 04/11/2018, 03/06/2020   Influenza,inj,Quad PF,6+ Mos 05/15/2014, 03/25/2015   Influenza-Unspecified 05/15/2014, 03/25/2015, 04/26/2019    PFIZER Comirnaty(Gray Top)Covid-19 Tri-Sucrose Vaccine 08/28/2019, 09/18/2019, 04/01/2020, 12/15/2020, 03/30/2021   PFIZER(Purple Top)SARS-COV-2 Vaccination 08/28/2019, 09/18/2019   Pneumococcal Conjugate-13 02/05/2019   TDAP status: Due, Education has been provided regarding the importance of this vaccine. Advised may receive this vaccine at local pharmacy or Health Dept. Aware to provide a copy of the vaccination record if obtained from local pharmacy or Health Dept. Verbalized acceptance and understanding.  Screening Tests Health Maintenance  Topic Date Due   DTaP/Tdap/Td (1 - Tdap) Never done   COVID-19 Vaccine (8 - 2023-24 season) 06/24/2022 (Originally 03/05/2022)   OPHTHALMOLOGY EXAM  06/09/2022   FOOT EXAM  09/03/2022   HEMOGLOBIN A1C  09/17/2022   Diabetic kidney evaluation - Urine ACR  04/23/2023   Diabetic kidney evaluation - GFR measurement  05/29/2023   Medicare Annual Wellness (AWV)  06/09/2023   INFLUENZA VACCINE  Completed   DEXA SCAN  Completed   HPV VACCINES  Aged Out   Pneumonia Vaccine 62+ Years old  Discontinued   Zoster Vaccines- Shingrix  Discontinued   Health Maintenance Health Maintenance Due  Topic Date Due   DTaP/Tdap/Td (1 - Tdap) Never done   Lung Cancer Screening: (Low Dose CT Chest recommended if Age 25-80 years, 30 pack-year currently smoking OR have quit w/in 15years.) does not qualify.   Hepatitis C Screening: does not qualify.  Vision Screening: Recommended annual ophthalmology exams for early detection of glaucoma and other disorders of the eye.  Dental Screening: Recommended annual dental exams for proper oral hygiene.  Community Resource Referral / Chronic Care Management: CRR required this visit?  No   CCM required this visit?  No      Plan:     I have personally reviewed and noted the following in the patient's chart:   Medical and social history Use of alcohol, tobacco or illicit drugs  Current medications and supplements  including opioid prescriptions. Patient is not currently taking opioid prescriptions. Functional ability and status Nutritional status Physical activity Advanced directives List of other physicians Hospitalizations, surgeries, and ER visits in previous 12 months Vitals Screenings to include cognitive, depression, and falls Referrals and appointments  In addition, I have reviewed and discussed with patient certain preventive protocols, quality metrics, and best practice recommendations. A written personalized care plan for preventive services as well as general preventive health recommendations were provided to patient.     Leta Jungling, LPN   [id number]/[id number]

## 2022-06-11 ENCOUNTER — Other Ambulatory Visit: Payer: Self-pay | Admitting: Internal Medicine

## 2022-06-11 MED ORDER — MECLIZINE HCL 12.5 MG PO TABS: 12.5000 mg | ORAL_TABLET | Freq: Three times a day (TID) | ORAL | 0 refills | Status: DC | PRN

## 2022-06-11 NOTE — Addendum Note (Signed)
Addended by: Caryl Bis, Sumeet Geter G on: 06/11/2022 05:00 PM   Modules accepted: Orders

## 2022-06-11 NOTE — Telephone Encounter (Signed)
Sent to pharmacy 

## 2022-06-14 DIAGNOSIS — Z0100 Encounter for examination of eyes and vision without abnormal findings: Secondary | ICD-10-CM | POA: Diagnosis not present

## 2022-06-14 DIAGNOSIS — E113293 Type 2 diabetes mellitus with mild nonproliferative diabetic retinopathy without macular edema, bilateral: Secondary | ICD-10-CM | POA: Diagnosis not present

## 2022-06-14 LAB — HM DIABETES EYE EXAM

## 2022-06-15 DIAGNOSIS — R42 Dizziness and giddiness: Secondary | ICD-10-CM | POA: Diagnosis not present

## 2022-06-17 ENCOUNTER — Ambulatory Visit: Payer: Medicare PPO | Admitting: Urology

## 2022-06-17 ENCOUNTER — Encounter: Payer: Self-pay | Admitting: Oncology

## 2022-06-17 VITALS — BP 174/78 | HR 76 | Ht 62.0 in | Wt 155.5 lb

## 2022-06-17 DIAGNOSIS — R3129 Other microscopic hematuria: Secondary | ICD-10-CM | POA: Diagnosis not present

## 2022-06-17 LAB — URINALYSIS, COMPLETE
Bilirubin, UA: NEGATIVE
Ketones, UA: NEGATIVE
Leukocytes,UA: NEGATIVE
Nitrite, UA: NEGATIVE
Protein,UA: NEGATIVE
Specific Gravity, UA: 1.02 (ref 1.005–1.030)
Urobilinogen, Ur: 0.2 mg/dL (ref 0.2–1.0)
pH, UA: 5.5 (ref 5.0–7.5)

## 2022-06-17 LAB — MICROSCOPIC EXAMINATION

## 2022-06-17 NOTE — Progress Notes (Addendum)
I,DeAsia L Maxie,acting as a scribe for Hollice Espy, MD.,have documented all relevant documentation on the behalf of Hollice Espy, MD,as directed by  Hollice Espy, MD while in the presence of Hollice Espy, MD.   06/17/22 2:32 PM   Kelsey Mason August 07, 1940 [id number]  Referring provider: Leone Haven, MD [address] [place],  [place] [address]  Chief Complaint  Patient presents with   Establish Care   Hematuria    HPI: 81 year-old who is referred for microscopic hematuria.   Urinalysis on 05/28/22 showed 6 to 10 red blood cells, 6 to 10 white blood cells, 11 to 20 squamous epithelial cells. Suspect this sample was contaminated.  She reports that this was in the ER and she was not given a wipe.  Alysis today is completely negative.  Renal ultrasound on 05/13/20 revealed bilateral echogenic kidneys but otherwise negative.   She denies gross hematuria, urinary issues.   She is a former smoker, quit 60 years ago.   PMH: Past Medical History:  Diagnosis Date   Arthritis    fingers   Breast symptom 2014   Cancer [facility]) 2014   Bilateral Breast, chemo Dr. Jeb Levering   Chronic HFmrEF (heart failure with mid-range ejection fraction) ([place])    a. 01/2017 Echo: EF 50-55%; b. 03/2018 Echo: EF 40-45%; c. 01/2020 Echo: EF 40%, glob HK, mild LVH, GrI DD.   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 01/2020 mild-mod MR.   NICM (nonischemic cardiomyopathy) ([place])    a. 01/2017 Echo: EF 50-55%; b. 03/2018 Echo: EF 40-45%; c. 01/2020 Echo: EF 40%, glob HK, mild LVH, GrI DD, nl RV fxn, mildly dil LA, mild to mod MR.   Nonobstructive CAD (coronary artery disease)    a. 06/2016 MV: low risk; b. 04/2018 Cath: LM nl, LAD 29m D1/2/3 nl, LCX nl, OM3 55ost Ca2+, RCA 50ost Ca2+.   PAD (peripheral  artery disease) [place]    a. 04/2018 Lower ext angio: L pop 369mR AT 100, R Peroneal 100d.   Personal history of chemotherapy    Personal history of radiation therapy 2014   bilat lumpectomy    Surgical History: Past Surgical History:  Procedure Laterality Date   ABDOMINAL AORTOGRAM W/LOWER EXTREMITY N/A 04/21/2018   Procedure: ABDOMINAL AORTOGRAM W/LOWER EXTREMITY;  Surgeon: EnNelva BushMD;  [facility];  Service: Cardiovascular;  Laterality: N/A;   ABDOMINAL HYSTERECTOMY  1976   for pelvic pain   BREAST EXCISIONAL BIOPSY Bilateral 11/2012   bilat breast ca chemo and rad   BREAST SURGERY Bilateral 2014   bilateral lumpectomy and SN biopsy   CATARACT EXTRACTION W/PHACO Left 07/30/2020   Procedure: CATARACT EXTRACTION PHACO AND INTRAOCULAR LENS PLACEMENT (IOC) LEFT DIABETIC 18.73 01:45.0 17.8%;  Surgeon: BrLeandrew KoyanagiMD;  [place] [facility] Service: Ophthalmology;  Laterality: Left;  Diabetic - insulin and opral meds   CATARACT EXTRACTION W/PHACO Right 08/13/2020   Procedure: CATARACT EXTRACTION PHACO AND INTRAOCULAR LENS PLACEMENT ([place] DIABETIC;  Surgeon: BrLeandrew KoyanagiMD;  [place] [facility] Service: Ophthalmology;  Laterality: Right;  10.67 1:27.8 12.1%   CHOLECYSTECTOMY  2007   COLONOSCOPY WITH PROPOFOL N/A 10/13/2015   Procedure: COLONOSCOPY WITH PROPOFOL;  Surgeon: RoManya SilvasMD;  Location:  [facility];  Service: Endoscopy;  Laterality: N/A;   LEFT HEART CATH AND CORONARY ANGIOGRAPHY N/A 04/21/2018   Procedure: LEFT HEART CATH AND CORONARY ANGIOGRAPHY;  Surgeon: Nelva Bush, MD;  Location: [place] [facility];  Service: Cardiovascular;  Laterality: N/A;   OOPHORECTOMY Right    PORTACATH PLACEMENT  2014   TUMOR REMOVAL Right 1980   right foot    Home Medications:  Allergies as of 06/17/2022       Reactions   Prednisone    Other reaction(s): Dizziness dizzy   Advil [ibuprofen] Other (See  Comments)   Dizziness   Aleve [naproxen] Other (See Comments)   Dizziness   Fluticasone-salmeterol Other (See Comments)   dizziness   Lipitor [atorvastatin] Other (See Comments)   Legs weak.  Leg pain, muscle ache   Naproxen Sodium Other (See Comments)   Penicillins Hives, Other (See Comments)   Has patient had a PCN reaction causing immediate rash, facial/tongue/throat swelling, SOB or lightheadedness with hypotension: YES Has patient had a PCN reaction causing severe rash involving mucus membranes or skin necrosis: no Has patient had a PCN reaction that required hospitalization: no Has patient had a PCN reaction occurring within the last 10 years: no If all of the above answers are "NO", then may proceed with Cephalosporin use.        Medication List        Accurate as of June 17, 2022 11:59 PM. If you have any questions, ask your nurse or doctor.          albuterol 108 (90 Base) MCG/ACT inhaler Commonly known as: VENTOLIN HFA TAKE 2 PUFFS BY MOUTH EVERY 6 HOURS AS NEEDED FOR WHEEZE OR SHORTNESS OF BREATH   aspirin 81 MG tablet Take 81 mg by mouth daily.   blood glucose meter kit and supplies Kit Dispense based on patient and insurance preference. Use up to four times daily as directed. One Touch Ultra mini meter (FOR ICD-9 250.00, 250.01). Dx: E11.9   carvedilol 12.5 MG tablet Commonly known as: COREG TAKE 1 TABLET BY MOUTH TWICE A DAY   cholecalciferol 25 MCG (1000 UNIT) tablet Commonly known as: VITAMIN D3 Take 1,000 Units by mouth daily.   cyanocobalamin 1000 MCG tablet Commonly known as: VITAMIN B12 Take 1,000 mcg by mouth daily.   dapagliflozin propanediol 10 MG Tabs tablet Commonly known as: FARXIGA Take 10 mg by mouth daily.   furosemide 20 MG tablet Commonly known as: LASIX TAKE 1 TABLET (20 MG TOTAL) BY MOUTH DAILY AS NEEDED (FOR >2LB WEIGHT GAIN OVERNIGHT OR 5 LB WEIGHT GAIN IN 1 WEEK OR SWELLING.).   glucose blood test strip Commonly  known as: ONE TOUCH ULTRA TEST USE TO TEST BLOOD SUGAR UP TO 4 TIMES DAILY   Lantus SoloStar 100 UNIT/ML Solostar Pen Generic drug: insulin glargine Inject 30 Units into the skin daily.   losartan 100 MG tablet Commonly known as: COZAAR TAKE 1/2 TABLET (50 MG TOTAL) BY MOUTH 2 (TWO) TIMES DAILY.   meclizine 12.5 MG tablet Commonly known as: ANTIVERT Take 1 tablet (12.5 mg total) by mouth 3 (three) times daily as needed for dizziness.   Nexlizet 180-10 MG Tabs Generic drug: Bempedoic Acid-Ezetimibe Take 1 tablet by mouth daily.   nitroGLYCERIN 0.4 MG SL tablet Commonly known as: NITROSTAT PLACE 1 TABLET (0.4 MG TOTAL) UNDER THE TONGUE EVERY 5 (FIVE) MINUTES AS NEEDED FOR CHEST PAIN. MAXIMUM OF 3 DOSES.   Novofine Pen Needle 32G X 6 MM Misc Generic  drug: Insulin Pen Needle USE AS DIRECTED WITH LANTUS PEN   Repatha SureClick 970 MG/ML Soaj Generic drug: Evolocumab Inject 140 mg into the skin every 14 (fourteen) days.   Trulicity 1.81 YO/3.7CH Sopn Generic drug: Dulaglutide Inject 1.5 mg as directed once a week.        Allergies:  Allergies  Allergen Reactions   Prednisone     Other reaction(s): Dizziness dizzy   Advil [Ibuprofen] Other (See Comments)    Dizziness    Aleve [Naproxen] Other (See Comments)    Dizziness   Fluticasone-Salmeterol Other (See Comments)    dizziness   Lipitor [Atorvastatin] Other (See Comments)    Legs weak.  Leg pain, muscle ache   Naproxen Sodium Other (See Comments)   Penicillins Hives and Other (See Comments)    Has patient had a PCN reaction causing immediate rash, facial/tongue/throat swelling, SOB or lightheadedness with hypotension: YES Has patient had a PCN reaction causing severe rash involving mucus membranes or skin necrosis: no Has patient had a PCN reaction that required hospitalization: no Has patient had a PCN reaction occurring within the last 10 years: no If all of the above answers are "NO", then may proceed with  Cephalosporin use.     Family History: Family History  Problem Relation Age of Onset   Alcohol abuse Mother    Heart disease Mother    Hypertension Mother    Breast cancer Mother 5   Alcohol abuse Father    Heart disease Father    Hypertension Father    Heart disease Brother    Heart attack Brother     Social History:  reports that she quit smoking about 60 years ago. Her smoking use included cigarettes. She has a 0.25 pack-year smoking history. She has never used smokeless tobacco. She reports that she does not drink alcohol and does not use drugs.   Physical Exam: BP (!) 174/78   Pulse 76   Ht _0  (1.575 m)   Wt 155 lb 8 oz (70.5 kg)   BMI 28.44 kg/m   Constitutional:  Alert and oriented, No acute distress. HEENT: [place] AT, moist mucus membranes.  Trachea midline, no masses. Neurologic: Grossly intact, no focal deficits, moving all 4 extremities. Psychiatric: Normal mood and affect.  Laboratory Data: Lab Results  Component Value Date   WBC 10.3 05/28/2022   HGB 14.5 05/28/2022   HCT 42.6 05/28/2022   MCV 87.8 05/28/2022   PLT 200 05/28/2022    Lab Results  Component Value Date   CREATININE 1.36 (H) 05/28/2022    Lab Results  Component Value Date   HGBA1C 6.6 (H) 03/19/2022    Urinalysis Results for orders placed or performed in visit on 06/17/22  Microscopic Examination   Urine  Result Value Ref Range   WBC, UA 0-5 0 - 5 /hpf   RBC, Urine 0-2 0 - 2 /hpf   Epithelial Cells (non renal) 0-10 0 - 10 /hpf   Bacteria, UA Few None seen/Few  Urinalysis, Complete  Result Value Ref Range   Specific Gravity, UA 1.020 1.005 - 1.030   pH, UA 5.5 5.0 - 7.5   Color, UA Yellow Yellow   Appearance Ur Clear Clear   Leukocytes,UA Negative Negative   Protein,UA Negative Negative/Trace   Glucose, UA 2+ (A) Negative   Ketones, UA Negative Negative   RBC, UA Trace (A) Negative   Bilirubin, UA Negative Negative   Urobilinogen, Ur 0.2 0.2 - 1.0 mg/dL   Nitrite,  UA  Negative Negative   Microscopic Examination See below:      Pertinent Imaging:  Results for orders placed during the hospital encounter of 05/13/20  US RENAL  Narrative CLINICAL DATA:  Acute kidney injury  EXAM: RENAL / URINARY TRACT ULTRASOUND COMPLETE  COMPARISON:  None.  FINDINGS: Right Kidney:  Renal measurements: 9.0 x 4.9 x 4.7 cm = volume: 109 mL. Echogenicity within normal limits. There is renal cortical thinning. No mass, perinephric fluid, or hydronephrosis visualized. No sonographically demonstrable calculus or ureterectasis.  Left Kidney:  Renal measurements: 10.1 x 4.9 x 4.3 cm = volume: 110 mL. Echogenicity within normal limits. There is renal cortical thinning. No mass, perinephric fluid, or hydronephrosis visualized. No sonographically demonstrable calculus or ureterectasis.  Bladder:  Appears normal for degree of bladder distention.  Other:  None.  IMPRESSION: There is renal cortical thinning which may be a function of age but also may be indicative of medical renal disease. Note that the renal echogenicity bilaterally is within normal limits. No obstructing focus in either kidney. Study otherwise unremarkable.   Electronically Signed By: Lowella Grip III M.D. On: 05/13/2020 19:26  Personally reviewed.  With the above radiologic interpretation.  Assessment & Plan:    Microscopic Hematuria I suspect the urinalysis was contaminated stone clinical history as well as presence of many squamous epithelial cells on her original urine.  Urinalysis today is negative which is reassuring.  This point in time, recommend deferring hematuria evaluation, will recheck urine again in a month, reevaluate the need for hematuria workup at that point in time.   4 weeks with UA   [facility] [address] [place], [place] [address] [phone]  I have reviewed the above documentation for accuracy and  completeness, and I agree with the above.   Hollice Espy, MD

## 2022-06-18 ENCOUNTER — Ambulatory Visit: Payer: Medicare PPO | Admitting: Family Medicine

## 2022-07-08 DIAGNOSIS — E1129 Type 2 diabetes mellitus with other diabetic kidney complication: Secondary | ICD-10-CM | POA: Diagnosis not present

## 2022-07-08 DIAGNOSIS — N184 Chronic kidney disease, stage 4 (severe): Secondary | ICD-10-CM | POA: Diagnosis not present

## 2022-07-08 DIAGNOSIS — R600 Localized edema: Secondary | ICD-10-CM | POA: Diagnosis not present

## 2022-07-08 DIAGNOSIS — I10 Essential (primary) hypertension: Secondary | ICD-10-CM | POA: Diagnosis not present

## 2022-07-08 DIAGNOSIS — R809 Proteinuria, unspecified: Secondary | ICD-10-CM | POA: Diagnosis not present

## 2022-07-08 DIAGNOSIS — N1832 Chronic kidney disease, stage 3b: Secondary | ICD-10-CM | POA: Diagnosis not present

## 2022-07-13 DIAGNOSIS — R600 Localized edema: Secondary | ICD-10-CM | POA: Diagnosis not present

## 2022-07-13 DIAGNOSIS — E1129 Type 2 diabetes mellitus with other diabetic kidney complication: Secondary | ICD-10-CM | POA: Diagnosis not present

## 2022-07-13 DIAGNOSIS — N1832 Chronic kidney disease, stage 3b: Secondary | ICD-10-CM | POA: Diagnosis not present

## 2022-07-13 DIAGNOSIS — I10 Essential (primary) hypertension: Secondary | ICD-10-CM | POA: Diagnosis not present

## 2022-07-15 ENCOUNTER — Encounter: Payer: Self-pay | Admitting: Oncology

## 2022-07-22 ENCOUNTER — Encounter: Payer: Self-pay | Admitting: Physician Assistant

## 2022-07-22 ENCOUNTER — Ambulatory Visit: Payer: Medicare PPO | Admitting: Physician Assistant

## 2022-07-22 VITALS — BP 190/67 | HR 73 | Ht 62.5 in | Wt 150.0 lb

## 2022-07-22 DIAGNOSIS — R3129 Other microscopic hematuria: Secondary | ICD-10-CM

## 2022-07-22 LAB — MICROSCOPIC EXAMINATION: Epithelial Cells (non renal): >10 /hpf — AB (ref 0–10)

## 2022-07-22 LAB — URINALYSIS, COMPLETE
Bilirubin, UA: NEGATIVE
Bilirubin, UA: NEGATIVE
Ketones, UA: NEGATIVE
Ketones, UA: NEGATIVE
Leukocytes,UA: NEGATIVE
Leukocytes,UA: NEGATIVE
Nitrite, UA: NEGATIVE
Nitrite, UA: NEGATIVE
Protein,UA: NEGATIVE
Protein,UA: NEGATIVE
Specific Gravity, UA: 1.015 (ref 1.005–1.030)
Specific Gravity, UA: 1.015 (ref 1.005–1.030)
Urobilinogen, Ur: 0.2 mg/dL (ref 0.2–1.0)
Urobilinogen, Ur: 0.2 mg/dL (ref 0.2–1.0)
pH, UA: 5.5 (ref 5.0–7.5)
pH, UA: 5 (ref 5.0–7.5)

## 2022-07-22 NOTE — Progress Notes (Signed)
07/22/2022 9:54 AM   Kelsey Mason 12/05/1940 [id number]  CC: Chief Complaint  Patient presents with   Follow-up   Hematuria   HPI: Kelsey Mason is a 82 y.o. female with PMH possible microscopic hematuria associated with contaminated urine samples who presents today for repeat UA.   Today she reports no new urinary symptoms in the past month.  She has no acute concerns today.  She clarifies today that she only smoked for several months about 60 years ago.  She did go on to have significant secondhand exposure from multiple family members including her husband.  In office voided UA today positive for 3+ glucose and 1+ blood; urine microscopy with 3-10 RBC/hpf, >10 epithelial cells/hpf, and moderate bacteria.  She was subsequently catheterized for urine specimen.  Catheterized UA today positive for 3+ glucose and 2+ blood; urine microscopy with 3-10 RBCs/HPF.  PMH: Past Medical History:  Diagnosis Date   Arthritis    fingers   Breast symptom 2014   Cancer [facility]) 2014   Bilateral Breast, chemo Dr. Jeb Levering   Chronic HFmrEF (heart failure with mid-range ejection fraction) ([place])    a. 01/2017 Echo: EF 50-55%; b. 03/2018 Echo: EF 40-45%; c. 01/2020 Echo: EF 40%, glob HK, mild LVH, GrI DD.   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 01/2020 mild-mod MR.   NICM (nonischemic cardiomyopathy) ([place])    a. 01/2017 Echo: EF 50-55%; b. 03/2018 Echo: EF 40-45%; c. 01/2020 Echo: EF 40%, glob HK, mild LVH, GrI DD, nl RV fxn, mildly dil LA, mild to mod MR.   Nonobstructive CAD (coronary artery disease)    a. 06/2016 MV: low risk; b. 04/2018 Cath: LM nl, LAD 71m D1/2/3 nl, LCX nl, OM3 55ost Ca2+, RCA 50ost Ca2+.   PAD (peripheral artery disease) ([place]    a. 04/2018 Lower ext angio: L pop 375mR AT 100, R  Peroneal 100d.   Personal history of chemotherapy    Personal history of radiation therapy 2014   bilat lumpectomy    Surgical History: Past Surgical History:  Procedure Laterality Date   ABDOMINAL AORTOGRAM W/LOWER EXTREMITY N/A 04/21/2018   Procedure: ABDOMINAL AORTOGRAM W/LOWER EXTREMITY;  Surgeon: EnNelva BushMD;  [facility];  Service: Cardiovascular;  Laterality: N/A;   ABDOMINAL HYSTERECTOMY  1976   for pelvic pain   BREAST EXCISIONAL BIOPSY Bilateral 11/2012   bilat breast ca chemo and rad   BREAST SURGERY Bilateral 2014   bilateral lumpectomy and SN biopsy   CATARACT EXTRACTION W/PHACO Left 07/30/2020   Procedure: CATARACT EXTRACTION PHACO AND INTRAOCULAR LENS PLACEMENT (IOC) LEFT DIABETIC 18.73 01:45.0 17.8%;  Surgeon: BrLeandrew KoyanagiMD;  [place] [facility] Service: Ophthalmology;  Laterality: Left;  Diabetic - insulin and opral meds   CATARACT EXTRACTION W/PHACO Right 08/13/2020   Procedure: CATARACT EXTRACTION PHACO AND INTRAOCULAR LENS PLACEMENT ([place] DIABETIC;  Surgeon: BrLeandrew KoyanagiMD;  [place] [place] Service: Ophthalmology;  Laterality: Right;  10.67 1:27.8 12.1%   CHOLECYSTECTOMY  2007   COLONOSCOPY WITH PROPOFOL N/A 10/13/2015   Procedure: COLONOSCOPY WITH PROPOFOL;  Surgeon: RoManya SilvasMD;  [facility];  Service: Endoscopy;  Laterality: N/A;   LEFT HEART CATH AND CORONARY ANGIOGRAPHY N/A 04/21/2018   Procedure:  LEFT HEART CATH AND CORONARY ANGIOGRAPHY;  Surgeon: Nelva Bush, MD;  Location: [facility];  Service: Cardiovascular;  Laterality: N/A;   OOPHORECTOMY Right    PORTACATH PLACEMENT  2014   TUMOR REMOVAL Right 1980   right foot    Home Medications:  Allergies as of 07/22/2022       Reactions   Prednisone    Other reaction(s): Dizziness dizzy   Advil [ibuprofen] Other (See Comments)   Dizziness   Aleve [naproxen] Other (See Comments)   Dizziness    Fluticasone-salmeterol Other (See Comments)   dizziness   Lipitor [atorvastatin] Other (See Comments)   Legs weak.  Leg pain, muscle ache   Naproxen Sodium Other (See Comments)   Penicillins Hives, Other (See Comments)   Has patient had a PCN reaction causing immediate rash, facial/tongue/throat swelling, SOB or lightheadedness with hypotension: YES Has patient had a PCN reaction causing severe rash involving mucus membranes or skin necrosis: no Has patient had a PCN reaction that required hospitalization: no Has patient had a PCN reaction occurring within the last 10 years: no If all of the above answers are "NO", then may proceed with Cephalosporin use.        Medication List        Accurate as of July 22, 2022  9:54 AM. If you have any questions, ask your nurse or doctor.          albuterol 108 (90 Base) MCG/ACT inhaler Commonly known as: VENTOLIN HFA TAKE 2 PUFFS BY MOUTH EVERY 6 HOURS AS NEEDED FOR WHEEZE OR SHORTNESS OF BREATH   aspirin 81 MG tablet Take 81 mg by mouth daily.   blood glucose meter kit and supplies Kit Dispense based on patient and insurance preference. Use up to four times daily as directed. One Touch Ultra mini meter (FOR ICD-9 250.00, 250.01). Dx: E11.9   carvedilol 12.5 MG tablet Commonly known as: COREG TAKE 1 TABLET BY MOUTH TWICE A DAY   cholecalciferol 25 MCG (1000 UNIT) tablet Commonly known as: VITAMIN D3 Take 1,000 Units by mouth daily.   cyanocobalamin 1000 MCG tablet Commonly known as: VITAMIN B12 Take 1,000 mcg by mouth daily.   dapagliflozin propanediol 10 MG Tabs tablet Commonly known as: FARXIGA Take 10 mg by mouth daily.   furosemide 20 MG tablet Commonly known as: LASIX TAKE 1 TABLET (20 MG TOTAL) BY MOUTH DAILY AS NEEDED (FOR >2LB WEIGHT GAIN OVERNIGHT OR 5 LB WEIGHT GAIN IN 1 WEEK OR SWELLING.).   glucose blood test strip Commonly known as: ONE TOUCH ULTRA TEST USE TO TEST BLOOD SUGAR UP TO 4 TIMES DAILY    Lantus SoloStar 100 UNIT/ML Solostar Pen Generic drug: insulin glargine Inject 30 Units into the skin daily.   losartan 100 MG tablet Commonly known as: COZAAR TAKE 1/2 TABLET (50 MG TOTAL) BY MOUTH 2 (TWO) TIMES DAILY.   meclizine 12.5 MG tablet Commonly known as: ANTIVERT Take 1 tablet (12.5 mg total) by mouth 3 (three) times daily as needed for dizziness.   Nexlizet 180-10 MG Tabs Generic drug: Bempedoic Acid-Ezetimibe Take 1 tablet by mouth daily.   nitroGLYCERIN 0.4 MG SL tablet Commonly known as: NITROSTAT PLACE 1 TABLET (0.4 MG TOTAL) UNDER THE TONGUE EVERY 5 (FIVE) MINUTES AS NEEDED FOR CHEST PAIN. MAXIMUM OF 3 DOSES.   Novofine Pen Needle 32G X 6 MM Misc Generic drug: Insulin Pen Needle USE AS DIRECTED WITH LANTUS PEN   Repatha SureClick 387 MG/ML Soaj Generic drug: Evolocumab  Inject 140 mg into the skin every 14 (fourteen) days.   Trulicity 1.5 JX/9.1YN Sopn Generic drug: Dulaglutide Inject 1.5 mg as directed once a week.        Allergies:  Allergies  Allergen Reactions   Prednisone     Other reaction(s): Dizziness dizzy   Advil [Ibuprofen] Other (See Comments)    Dizziness    Aleve [Naproxen] Other (See Comments)    Dizziness   Fluticasone-Salmeterol Other (See Comments)    dizziness   Lipitor [Atorvastatin] Other (See Comments)    Legs weak.  Leg pain, muscle ache   Naproxen Sodium Other (See Comments)   Penicillins Hives and Other (See Comments)    Has patient had a PCN reaction causing immediate rash, facial/tongue/throat swelling, SOB or lightheadedness with hypotension: YES Has patient had a PCN reaction causing severe rash involving mucus membranes or skin necrosis: no Has patient had a PCN reaction that required hospitalization: no Has patient had a PCN reaction occurring within the last 10 years: no If all of the above answers are "NO", then may proceed with Cephalosporin use.     Family History: Family History  Problem Relation Age  of Onset   Alcohol abuse Mother    Heart disease Mother    Hypertension Mother    Breast cancer Mother 14   Alcohol abuse Father    Heart disease Father    Hypertension Father    Heart disease Brother    Heart attack Brother     Social History:   reports that she quit smoking about 61 years ago. Her smoking use included cigarettes. She has a 0.25 pack-year smoking history. She has never been exposed to tobacco smoke. She has never used smokeless tobacco. She reports that she does not drink alcohol and does not use drugs.  Physical Exam: BP (!) 190/67   Pulse 73   Ht 5' 2.5" (1.588 m)   Wt 150 lb (68 kg)   BMI 27.00 kg/m   Constitutional:  Alert and oriented, no acute distress, nontoxic appearing HEENT: [place], AT Cardiovascular: No clubbing, cyanosis, or edema Respiratory: Normal respiratory effort, no increased work of breathing Skin: No rashes, bruises or suspicious lesions Neurologic: Grossly intact, no focal deficits, moving all 4 extremities Psychiatric: Normal mood and affect  Laboratory Data: Results for orders placed or performed in visit on 07/22/22  Microscopic Examination   Urine  Result Value Ref Range   WBC, UA 0-5 0 - 5 /hpf   RBC, Urine 3-10 (A) 0 - 2 /hpf   Epithelial Cells (non renal) >10 (A) 0 - 10 /hpf   Bacteria, UA Moderate (A) None seen/Few  Microscopic Examination   Urine  Result Value Ref Range   WBC, UA 0-5 0 - 5 /hpf   RBC, Urine 3-10 (A) 0 - 2 /hpf   Epithelial Cells (non renal) 0-10 0 - 10 /hpf   Bacteria, UA Few None seen/Few  Urinalysis, Complete  Result Value Ref Range   Specific Gravity, UA 1.015 1.005 - 1.030   pH, UA 5.5 5.0 - 7.5   Color, UA Yellow Yellow   Appearance Ur Clear Clear   Leukocytes,UA Negative Negative   Protein,UA Negative Negative/Trace   Glucose, UA 3+ (A) Negative   Ketones, UA Negative Negative   RBC, UA 1+ (A) Negative   Bilirubin, UA Negative Negative   Urobilinogen, Ur 0.2 0.2 - 1.0 mg/dL   Nitrite, UA  Negative Negative   Microscopic Examination See below:  Urinalysis, Complete  Result Value Ref Range   Specific Gravity, UA 1.015 1.005 - 1.030   pH, UA 5.0 5.0 - 7.5   Color, UA Yellow Yellow   Appearance Ur Clear Clear   Leukocytes,UA Negative Negative   Protein,UA Negative Negative/Trace   Glucose, UA 3+ (A) Negative   Ketones, UA Negative Negative   RBC, UA 2+ (A) Negative   Bilirubin, UA Negative Negative   Urobilinogen, Ur 0.2 0.2 - 1.0 mg/dL   Nitrite, UA Negative Negative   Microscopic Examination See below:    Assessment & Plan:   1. Microscopic hematuria Persistent microscopic hematuria today on catheterized urine specimen.  With her extensive secondhand smoke exposure, we discussed pursuing a hematuria workup today and she agreed.  We discussed that there are various etiologies for blood in the urine including but not limited to infection, stones, cysts, anticoagulation, and urinary malignancies.  For renal function, will obtain MR urogram and have her follow-up with Dr. Erlene Quan for cystoscopy.  She expressed understanding. - Urinalysis, Complete - Urinalysis, Complete - MR ABDOMEN WWO CONTRAST; Future - MR PELVIS W WO CONTRAST; Future   Return in about 4 weeks (around 08/19/2022) for MR urogram results + cystoscopy with Dr. Erlene Quan.  Debroah Loop, PA-C  [facility] [address] [place], [place] [address] [phone]

## 2022-07-29 DIAGNOSIS — R9082 White matter disease, unspecified: Secondary | ICD-10-CM | POA: Diagnosis not present

## 2022-07-29 DIAGNOSIS — I10 Essential (primary) hypertension: Secondary | ICD-10-CM | POA: Diagnosis not present

## 2022-07-29 DIAGNOSIS — G3184 Mild cognitive impairment, so stated: Secondary | ICD-10-CM | POA: Diagnosis not present

## 2022-07-29 DIAGNOSIS — R519 Headache, unspecified: Secondary | ICD-10-CM | POA: Diagnosis not present

## 2022-07-29 DIAGNOSIS — H8110 Benign paroxysmal vertigo, unspecified ear: Secondary | ICD-10-CM | POA: Diagnosis not present

## 2022-07-29 DIAGNOSIS — G250 Essential tremor: Secondary | ICD-10-CM | POA: Diagnosis not present

## 2022-08-04 ENCOUNTER — Other Ambulatory Visit: Payer: Self-pay | Admitting: Internal Medicine

## 2022-08-05 ENCOUNTER — Ambulatory Visit
Admission: RE | Admit: 2022-08-05 | Discharge: 2022-08-05 | Disposition: A | Discharge status: Home / Self Care | Payer: Medicare PPO | Source: Ambulatory Visit | Attending: Physician Assistant | Admitting: Physician Assistant

## 2022-08-05 DIAGNOSIS — K5730 Diverticulosis of large intestine without perforation or abscess without bleeding: Secondary | ICD-10-CM | POA: Diagnosis not present

## 2022-08-05 DIAGNOSIS — R3129 Other microscopic hematuria: Secondary | ICD-10-CM

## 2022-08-05 MED ORDER — GADOBUTROL 1 MMOL/ML IV SOLN
6.0000 mL | Freq: Once | INTRAVENOUS | Status: AC | PRN
  Administered 2022-08-05: 6 mL via INTRAVENOUS

## 2022-08-24 DIAGNOSIS — E1122 Type 2 diabetes mellitus with diabetic chronic kidney disease: Secondary | ICD-10-CM | POA: Diagnosis not present

## 2022-08-24 DIAGNOSIS — N1832 Chronic kidney disease, stage 3b: Secondary | ICD-10-CM | POA: Diagnosis not present

## 2022-08-24 LAB — PROTEIN / CREATININE RATIO, URINE
Albumin, U: 11
Creatinine, Urine: 107

## 2022-08-24 LAB — MICROALBUMIN / CREATININE URINE RATIO: Microalb Creat Ratio: 10.3

## 2022-08-25 ENCOUNTER — Ambulatory Visit: Payer: Medicare PPO | Admitting: Urology

## 2022-08-25 VITALS — BP 112/63 | HR 74 | Ht 62.5 in | Wt 154.1 lb

## 2022-08-25 DIAGNOSIS — R3129 Other microscopic hematuria: Secondary | ICD-10-CM | POA: Diagnosis not present

## 2022-08-25 LAB — URINALYSIS, COMPLETE
Bilirubin, UA: NEGATIVE
Ketones, UA: NEGATIVE
Leukocytes,UA: NEGATIVE
Nitrite, UA: NEGATIVE
Protein,UA: NEGATIVE
Specific Gravity, UA: 1.01 (ref 1.005–1.030)
Urobilinogen, Ur: 0.2 mg/dL (ref 0.2–1.0)
pH, UA: 5.5 (ref 5.0–7.5)

## 2022-08-25 LAB — MICROSCOPIC EXAMINATION

## 2022-08-25 NOTE — Progress Notes (Signed)
   08/25/22  CC:  Chief Complaint  Patient presents with   Cysto    HPI: 82 year old female with microscopic hematuria who presents today for cystoscopic evaluation.  She underwent MRU on 08/09/2021 which shows some mild perinephric stranding with atrophy but otherwise no GU pathology.  Incidental cystocele/pelvic organ prolapse was appreciated.    Blood pressure 112/63, pulse 74, height 5' 2.5" (1.588 m), weight 154 lb 2 oz (69.9 kg). NED. A&Ox3.   No respiratory distress   Abd soft, NT, ND Normal external genitalia with patent urethral meatus.  Initially on exam, there was questionable blood at the meatus however on reexamination with a speculum chaperoned by CMA, Anastassia this was not appreciated.  She had actually fairly good anterior vaginal wall support, apex of the the vagina with some descent, just proximal to the introitus.  Cystoscopy Procedure Note  Patient identification was confirmed, informed consent was obtained, and patient was prepped using Betadine solution.  Lidocaine jelly was administered per urethral meatus.    Procedure: - Flexible cystoscope introduced, without any difficulty.   - Thorough search of the bladder revealed:    normal urethral meatus    normal urothelium    no stones    no ulcers     no tumors    no urethral polyps    no trabeculation  - Ureteral orifices were normal in position and appearance.  Post-Procedure: - Patient tolerated the procedure well  Assessment/ Plan:  1. Microscopic hematuria Image including MR urogram as well as cystoscopy today unremarkable for any GU pathology  She does have 3-10 red blood cells per high-power field in the absence of infection today, will continue to monitor this  Plan for follow-up in 1 year with UA with Sam - Urinalysis, Complete  Hollice Espy, MD

## 2022-08-31 DIAGNOSIS — E1169 Type 2 diabetes mellitus with other specified complication: Secondary | ICD-10-CM | POA: Diagnosis not present

## 2022-08-31 DIAGNOSIS — N1832 Chronic kidney disease, stage 3b: Secondary | ICD-10-CM | POA: Diagnosis not present

## 2022-08-31 DIAGNOSIS — E1122 Type 2 diabetes mellitus with diabetic chronic kidney disease: Secondary | ICD-10-CM | POA: Diagnosis not present

## 2022-08-31 DIAGNOSIS — E1159 Type 2 diabetes mellitus with other circulatory complications: Secondary | ICD-10-CM | POA: Diagnosis not present

## 2022-08-31 DIAGNOSIS — E785 Hyperlipidemia, unspecified: Secondary | ICD-10-CM | POA: Diagnosis not present

## 2022-08-31 DIAGNOSIS — I10 Essential (primary) hypertension: Secondary | ICD-10-CM | POA: Diagnosis not present

## 2022-09-07 ENCOUNTER — Ambulatory Visit: Payer: Medicare PPO | Admitting: Family Medicine

## 2022-09-07 ENCOUNTER — Encounter: Payer: Self-pay | Admitting: Family Medicine

## 2022-09-07 VITALS — BP 120/82 | HR 67 | Temp 97.6°F | Ht 62.5 in | Wt 155.0 lb

## 2022-09-07 DIAGNOSIS — E1122 Type 2 diabetes mellitus with diabetic chronic kidney disease: Secondary | ICD-10-CM

## 2022-09-07 DIAGNOSIS — I2510 Atherosclerotic heart disease of native coronary artery without angina pectoris: Secondary | ICD-10-CM | POA: Diagnosis not present

## 2022-09-07 DIAGNOSIS — J309 Allergic rhinitis, unspecified: Secondary | ICD-10-CM

## 2022-09-07 DIAGNOSIS — N1832 Chronic kidney disease, stage 3b: Secondary | ICD-10-CM

## 2022-09-07 DIAGNOSIS — I10 Essential (primary) hypertension: Secondary | ICD-10-CM

## 2022-09-07 DIAGNOSIS — I5022 Chronic systolic (congestive) heart failure: Secondary | ICD-10-CM | POA: Diagnosis not present

## 2022-09-07 DIAGNOSIS — H8110 Benign paroxysmal vertigo, unspecified ear: Secondary | ICD-10-CM | POA: Diagnosis not present

## 2022-09-07 DIAGNOSIS — R1013 Epigastric pain: Secondary | ICD-10-CM

## 2022-09-07 DIAGNOSIS — R3129 Other microscopic hematuria: Secondary | ICD-10-CM

## 2022-09-07 DIAGNOSIS — Z794 Long term (current) use of insulin: Secondary | ICD-10-CM

## 2022-09-07 LAB — LIPASE: Lipase: 6 U/L — ABNORMAL LOW (ref 11.0–59.0)

## 2022-09-07 LAB — COMPREHENSIVE METABOLIC PANEL
ALT: 11 U/L (ref 0–35)
AST: 14 U/L (ref 0–37)
Albumin: 3.8 g/dL (ref 3.5–5.2)
Alkaline Phosphatase: 63 U/L (ref 39–117)
BUN: 22 mg/dL (ref 6–23)
CO2: 27 mEq/L (ref 19–32)
Calcium: 9.8 mg/dL (ref 8.4–10.5)
Chloride: 100 mEq/L (ref 96–112)
Creatinine, Ser: 1.59 mg/dL — ABNORMAL HIGH (ref 0.40–1.20)
GFR: 30.29 mL/min — ABNORMAL LOW (ref >60.00)
Glucose, Bld: 110 mg/dL — ABNORMAL HIGH (ref 70–99)
Potassium: 4 mEq/L (ref 3.5–5.1)
Sodium: 136 mEq/L (ref 135–145)
Total Bilirubin: 0.6 mg/dL (ref 0.2–1.2)
Total Protein: 6 g/dL (ref 6.0–8.3)

## 2022-09-07 LAB — POCT INFLUENZA A/B
Influenza A, POC: NEGATIVE
Influenza B, POC: NEGATIVE

## 2022-09-07 LAB — POC COVID19 BINAXNOW: SARS Coronavirus 2 Ag: NEGATIVE

## 2022-09-07 MED ORDER — CLARITIN 5 MG PO CHEW: 5.0000 mg | CHEWABLE_TABLET | Freq: Every day | ORAL | 1 refills | Status: DC

## 2022-09-07 NOTE — Assessment & Plan Note (Addendum)
Chronic issue.  Patient reports some increase swelling recently though weight is stable.  She will continue losartan 50 mg twice daily, Lasix 20 mg daily as needed, carvedilol 12.5 mg twice daily, and Farxiga 10 mg daily.

## 2022-09-07 NOTE — Assessment & Plan Note (Signed)
Patient will monitor for significant recurrence.  She can continue meclizine as needed for this.

## 2022-09-07 NOTE — Addendum Note (Signed)
Addended by: Jeralyn Bennett A on: 09/07/2022 11:24 AM   Modules accepted: Orders

## 2022-09-07 NOTE — Assessment & Plan Note (Signed)
Patient will continue to follow with urology.

## 2022-09-07 NOTE — Assessment & Plan Note (Signed)
Uncontrolled.  She will continue to follow with endocrinology.

## 2022-09-07 NOTE — Assessment & Plan Note (Signed)
Symptoms seem consistent with allergies.  Will treat with Claritin 5 mg daily.

## 2022-09-07 NOTE — Assessment & Plan Note (Addendum)
Chronic issue.  Continue risk factor management.

## 2022-09-07 NOTE — Assessment & Plan Note (Addendum)
Chronic issue.  Well-controlled today.  Home blood pressure cuff appears to be inaccurate.  Patient was encouraged to get a new blood pressure cuff.  She will continue carvedilol 12.5 mg twice daily and losartan 50 mg twice daily.

## 2022-09-07 NOTE — Assessment & Plan Note (Signed)
Patient with some tightness in her epigastric region.  We will check lab work to evaluate for underlying causes.  Discussed this could be related to constipation and I advised her to start on her MiraLAX.  If not improving she will let us know.

## 2022-09-07 NOTE — Patient Instructions (Signed)
Nice to see you.  We will get labs today. Please start on your miralax. If this is not helpful in the next 2 weeks please let me know.  I am going to start you on claritin for your allergy symptoms. If this is not helpful please let me know.

## 2022-09-07 NOTE — Progress Notes (Signed)
[name], MD Phone: [phone]  Kelsey Mason is a 82 y.o. female who presents today for f/u.  HYPERTENSION/CAD Disease Monitoring Home cuff today measured 167/94 in the office. Chest pain- no    Dyspnea- no Medications Compliance-  taking coreg, losartan, Aspirin.   Edema- notes some recently and has been taking fluid pills with good benefit BMET    Component Value Date/Time   NA 138 05/28/2022 0954   NA 137 02/19/2019 0000   NA 135 (L) 01/07/2014 1000   K 4.3 05/28/2022 0954   K 4.7 01/07/2014 1000   CL 105 05/28/2022 0954   CL 98 01/07/2014 1000   CO2 20 (L) 05/28/2022 0954   CO2 30 01/07/2014 1000   GLUCOSE 98 05/28/2022 0954   GLUCOSE 268 (H) 01/07/2014 1000   BUN 14 05/28/2022 0954   BUN 19 02/19/2019 0000   BUN 19 (H) 01/07/2014 1000   CREATININE 1.36 (H) 05/28/2022 0954   CREATININE 1.24 01/07/2014 1000   CALCIUM 9.2 05/28/2022 0954   CALCIUM 10.0 01/07/2014 1000   GFRNONAA 39 (L) 05/28/2022 0954   GFRNONAA 43 (L) 01/07/2014 1000   GFRAA 30 (L) 12/24/2019 0903   GFRAA 50 (L) 01/07/2014 1000   DIABETES Disease Monitoring: Blood Sugar ranges-fasting 53-116, later in day 100-200, notes A1c with endocrinology was in the 8s      Optho- UTD Medications: Compliance- taking jardiance, lantus, mounjaro Hypoglycemic symptoms- yes, eats and improves  Allergies: Patient notes over the last week or so she has had some rhinorrhea, postnasal drip, sneezing, and cough.  She has no shortness of breath or fevers.  She does not take any allergy medications.  She attributes this to using a new air freshener in her car.  Epigastric discomfort: Patient notes recently she has had a tightness in her epigastric region and has been belching a lot.  She notes this has been going on since January.  She has a bowel movement every 3 to 4 days which is typical for her.  She has to strain to have bowel movements.  No diarrhea or vomiting.  No blood in her stool.  No  dysphagia.  Rarely has reflux symptoms.  Microscopic hematuria: Patient has been evaluated by urology for this.  She had a cystoscopy that did not reveal any cause for her issues.  She had an MR urogram that was unremarkable.  Urology plans to monitor this.  Vertigo she was evaluated in the emergency department in November 2023.  She was felt to have BPPV.  She had an MRI brain and CTA head and neck that did not reveal any acute causes for her symptoms.  She has followed up with neurology since then.  She does intermittently take her meclizine for this.   Social History   Tobacco Use  Smoking Status Former   Packs/day: 0.25   Years: 1.00   Total pack years: 0.25   Types: Cigarettes   Quit date: 1963   Years since quitting: 61.2   Passive exposure: Never  Smokeless Tobacco Never    Current Outpatient Medications on File Prior to Visit  Medication Sig Dispense Refill   albuterol (VENTOLIN HFA) 108 (90 Base) MCG/ACT inhaler TAKE 2 PUFFS BY MOUTH EVERY 6 HOURS AS NEEDED FOR WHEEZE OR SHORTNESS OF BREATH 18 each 11   aspirin 81 MG tablet Take 81 mg by mouth daily.     Bempedoic Acid-Ezetimibe (NEXLIZET) 180-10 MG TABS Take 1 tablet by mouth daily. 30 tablet 6  blood glucose meter kit and supplies KIT Dispense based on patient and insurance preference. Use up to four times daily as directed. One Touch Ultra mini meter (FOR ICD-9 250.00, 250.01). Dx: E11.9 1 each 0   carvedilol (COREG) 12.5 MG tablet TAKE 1 TABLET BY MOUTH TWICE A DAY 180 tablet 2   cholecalciferol (VITAMIN D) 25 MCG (1000 UT) tablet Take 1,000 Units by mouth daily.     dapagliflozin propanediol (FARXIGA) 10 MG TABS tablet Take 10 mg by mouth daily.     Evolocumab (REPATHA SURECLICK) [id number] MG/ML SOAJ Inject 140 mg into the skin every 14 (fourteen) days. 2 mL 6   furosemide (LASIX) 20 MG tablet TAKE 1 TABLET (20 MG TOTAL) BY MOUTH DAILY AS NEEDED (FOR >2LB WEIGHT GAIN OVERNIGHT OR 5 LB WEIGHT GAIN IN 1 WEEK OR SWELLING.). 90  tablet 3   glucose blood (ONE TOUCH ULTRA TEST) test strip USE TO TEST BLOOD SUGAR UP TO 4 TIMES DAILY 100 each 6   insulin glargine (LANTUS SOLOSTAR) 100 UNIT/ML Solostar Pen Inject 30 Units into the skin daily. 15 mL 0   JARDIANCE 25 MG TABS tablet Take 25 mg by mouth daily.     losartan (COZAAR) 100 MG tablet TAKE 1/2 TABLET (50 MG TOTAL) BY MOUTH 2 (TWO) TIMES DAILY. 90 tablet 1   meclizine (ANTIVERT) 12.5 MG tablet Take 1 tablet (12.5 mg total) by mouth 3 (three) times daily as needed for dizziness. 30 tablet 0   MOUNJARO 5 MG/0.5ML Pen SMARTSIG:0.5 Milliliter(s) SUB-Q Once a Week     nitroGLYCERIN (NITROSTAT) 0.4 MG SL tablet PLACE 1 TABLET (0.4 MG TOTAL) UNDER THE TONGUE EVERY 5 (FIVE) MINUTES AS NEEDED FOR CHEST PAIN. MAXIMUM OF 3 DOSES. 25 tablet 3   NOVOFINE PEN NEEDLE 32G X 6 MM MISC USE AS DIRECTED WITH LANTUS PEN 123XX123 each 2   TRULICITY 1.5 0000000 SOPN Inject 1.5 mg as directed once a week.  11   vitamin B-12 (CYANOCOBALAMIN) 1000 MCG tablet Take 1,000 mcg by mouth daily.     No current facility-administered medications on file prior to visit.     ROS see history of present illness  Objective  Physical Exam Vitals:   09/07/22 1041  BP: 120/82  Pulse: 67  Temp: 97.6 F (36.4 C)  SpO2: 99%    BP Readings from Last 3 Encounters:  09/07/22 120/82  08/25/22 112/63  07/22/22 (!) 190/67   Wt Readings from Last 3 Encounters:  09/07/22 155 lb (70.3 kg)  08/25/22 154 lb 2 oz (69.9 kg)  07/22/22 150 lb (68 kg)    Physical Exam Constitutional:      General: She is not in acute distress.    Appearance: She is not diaphoretic.  Cardiovascular:     Rate and Rhythm: Normal rate and regular rhythm.     Heart sounds: Normal heart sounds.  Pulmonary:     Effort: Pulmonary effort is normal.     Breath sounds: Normal breath sounds.  Abdominal:     General: Bowel sounds are normal. There is no distension.     Palpations: Abdomen is soft.     Tenderness: There is no  abdominal tenderness.  Skin:    General: Skin is warm and dry.  Neurological:     Mental Status: She is alert.      Assessment/Plan: Please see individual problem list.  Essential hypertension Assessment & Plan: Chronic issue.  Well-controlled today.  Home blood pressure cuff appears to be inaccurate.  Patient was encouraged to get a new blood pressure cuff.  She will continue carvedilol 12.5 mg twice daily and losartan 50 mg twice daily.   Type 2 diabetes mellitus with stage 3b chronic kidney disease, with long-term current use of insulin ([place]) Assessment & Plan: Uncontrolled.  She will continue to follow with endocrinology.   Chronic HFrEF (heart failure with reduced ejection fraction) ([facility]) Assessment & Plan: Chronic issue.  Patient reports some increase swelling recently though weight is stable.  She will continue losartan 50 mg twice daily, Lasix 20 mg daily as needed, carvedilol 12.5 mg twice daily, and Farxiga 10 mg daily.   Coronary artery disease involving native coronary artery of native heart without angina pectoris Assessment & Plan: Chronic issue.  Continue risk factor management.   Allergic rhinitis, unspecified seasonality, unspecified trigger Assessment & Plan: Symptoms seem consistent with allergies.  Will treat with Claritin 5 mg daily.  Orders: -     Claritin; Chew 1 tablet (5 mg total) by mouth daily.  Dispense: 30 tablet; Refill: 1  Benign paroxysmal positional vertigo, unspecified laterality Assessment & Plan: Patient will monitor for significant recurrence.  She can continue meclizine as needed for this.   Epigastric abdominal pain Assessment & Plan: Patient with some tightness in her epigastric region.  We will check lab work to evaluate for underlying causes.  Discussed this could be related to constipation and I advised her to start on her MiraLAX.  If not improving she will let us know.  Orders: -     Comprehensive metabolic panel -      Lipase  Microscopic hematuria Assessment & Plan: Patient will continue to follow with urology.     Return in about 3 months (around 12/08/2022).   [name], MD [place]

## 2022-09-10 ENCOUNTER — Telehealth: Payer: Self-pay | Admitting: Family Medicine

## 2022-09-10 NOTE — Telephone Encounter (Signed)
Patient returned office phone call for lab results. 

## 2022-09-30 ENCOUNTER — Ambulatory Visit: Payer: Medicare PPO | Admitting: Nurse Practitioner

## 2022-09-30 ENCOUNTER — Encounter: Payer: Self-pay | Admitting: Nurse Practitioner

## 2022-09-30 VITALS — BP 118/72 | HR 83 | Temp 98.2°F | Ht 62.5 in | Wt 150.6 lb

## 2022-09-30 DIAGNOSIS — J0100 Acute maxillary sinusitis, unspecified: Secondary | ICD-10-CM | POA: Diagnosis not present

## 2022-09-30 MED ORDER — HYDROCODONE BIT-HOMATROP MBR 5-1.5 MG/5ML PO SOLN: 5.0000 mL | Freq: Three times a day (TID) | ORAL | 0 refills | Status: AC | PRN

## 2022-09-30 MED ORDER — DOXYCYCLINE HYCLATE 100 MG PO TABS: 100.0000 mg | ORAL_TABLET | Freq: Two times a day (BID) | ORAL | 0 refills | Status: DC

## 2022-09-30 NOTE — Progress Notes (Signed)
Established Patient Office Visit  Subjective:  Patient ID: Kelsey Mason, female    DOB: Nov 10, 1940  Age: 82 y.o. MRN: [id number]  CC:  Chief Complaint  Patient presents with   Sore Throat   Sinusitis    HPI  Sheppard Plumber presents for sinus pressure, PND, sorethroat and cough since 2 days. The cough is not productive, it is mostly dry and does not let her sleep at night.     Sore Throat  Associated symptoms include coughing.  Sinusitis Associated symptoms include coughing, sinus pressure and a sore throat.     Past Medical History:  Diagnosis Date   Arthritis    fingers   Breast symptom 2014   Cancer [facility]) 2014   Bilateral Breast, chemo Dr. Jeb Levering   Chronic HFmrEF (heart failure with mid-range ejection fraction) ([place])    a. 01/2017 Echo: EF 50-55%; b. 03/2018 Echo: EF 40-45%; c. 01/2020 Echo: EF 40%, glob HK, mild LVH, GrI DD.   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 01/2020 mild-mod MR.   NICM (nonischemic cardiomyopathy) ([place])    a. 01/2017 Echo: EF 50-55%; b. 03/2018 Echo: EF 40-45%; c. 01/2020 Echo: EF 40%, glob HK, mild LVH, GrI DD, nl RV fxn, mildly dil LA, mild to mod MR.   Nonobstructive CAD (coronary artery disease)    a. 06/2016 MV: low risk; b. 04/2018 Cath: LM nl, LAD 52m, D1/2/3 nl, LCX nl, OM3 55ost Ca2+, RCA 50ost Ca2+.   PAD (peripheral artery disease) ([place])    a. 04/2018 Lower ext angio: L pop 25m, R AT 100, R Peroneal 100d.   Personal history of chemotherapy    Personal history of radiation therapy 2014   bilat lumpectomy    Past Surgical History:  Procedure Laterality Date   ABDOMINAL AORTOGRAM W/LOWER EXTREMITY N/A 04/21/2018   Procedure: ABDOMINAL AORTOGRAM W/LOWER EXTREMITY;  Surgeon: Nelva Bush, MD;  Location: [facility];  Service:  Cardiovascular;  Laterality: N/A;   ABDOMINAL HYSTERECTOMY  1976   for pelvic pain   BREAST EXCISIONAL BIOPSY Bilateral 11/2012   bilat breast ca chemo and rad   BREAST SURGERY Bilateral 2014   bilateral lumpectomy and SN biopsy   CATARACT EXTRACTION W/PHACO Left 07/30/2020   Procedure: CATARACT EXTRACTION PHACO AND INTRAOCULAR LENS PLACEMENT ([facility]) LEFT DIABETIC 18.73 01:45.0 17.8%;  Surgeon: Leandrew Koyanagi, MD;  Location: [facility];  Service: Ophthalmology;  Laterality: Left;  Diabetic - insulin and opral meds   CATARACT EXTRACTION W/PHACO Right 08/13/2020   Procedure: CATARACT EXTRACTION PHACO AND INTRAOCULAR LENS PLACEMENT ([place]) RIGHT DIABETIC;  Surgeon: Leandrew Koyanagi, MD;  Location: [place];  Service: Ophthalmology;  Laterality: Right;  10.67 1:27.8 12.1%   CHOLECYSTECTOMY  2007   COLONOSCOPY WITH PROPOFOL N/A 10/13/2015   Procedure: COLONOSCOPY WITH PROPOFOL;  Surgeon: Manya Silvas, MD;  Location: [facility];  Service: Endoscopy;  Laterality: N/A;   LEFT HEART CATH AND CORONARY ANGIOGRAPHY N/A 04/21/2018   Procedure: LEFT HEART CATH AND CORONARY ANGIOGRAPHY;  Surgeon: Nelva Bush, MD;  Location: [place] [facility];  Service: Cardiovascular;  Laterality: N/A;   OOPHORECTOMY Right    PORTACATH PLACEMENT  2014   TUMOR REMOVAL Right 1980   right foot    Family History  Problem Relation  Age of Onset   Alcohol abuse Mother    Heart disease Mother    Hypertension Mother    Breast cancer Mother 61   Alcohol abuse Father    Heart disease Father    Hypertension Father    Heart disease Brother    Heart attack Brother     Social History   Socioeconomic History   Marital status: Widowed    Spouse name: Not on file   Number of children: Not on file   Years of education: Not on file   Highest education level: Not on file  Occupational History   Not on file  Tobacco Use   Smoking status: Former    Packs/day: 0.25    Years: 1.00     Additional pack years: 0.00    Total pack years: 0.25    Types: Cigarettes    Quit date: 1963    Years since quitting: 61.2    Passive exposure: Never   Smokeless tobacco: Never  Vaping Use   Vaping Use: Never used  Substance and Sexual Activity   Alcohol use: No    Alcohol/week: 0.0 standard drinks of alcohol   Drug use: No   Sexual activity: Never  Other Topics Concern   Not on file  Social History Narrative   Lives in [place].       Works - [employer]   Diet - regular   Exercise - dance 3x per week   Social Determinants of Health   Financial Resource Strain: Low Risk  (06/08/2022)   Overall Financial Resource Strain (CARDIA)    Difficulty of Paying Living Expenses: Not hard at all  Food Insecurity: No Food Insecurity (06/08/2022)   Hunger Vital Sign    Worried About Running Out of Food in the Last Year: Never true    Ran Out of Food in the Last Year: Never true  Transportation Needs: No Transportation Needs (06/08/2022)   PRAPARE - Hydrologist (Medical): No    Lack of Transportation (Non-Medical): No  Physical Activity: Sufficiently Active (06/08/2022)   Exercise Vital Sign    Days of Exercise per Week: 2 days    Minutes of Exercise per Session: 150+ min  Stress: No Stress Concern Present (06/08/2022)   [place]    Feeling of Stress : Not at all  Social Connections: Unknown (06/08/2022)   Social Connection and Isolation Panel [NHANES]    Frequency of Communication with Friends and Family: More than three times a week    Frequency of Social Gatherings with Friends and Family: Twice a week    Attends Religious Services: Not on Diplomatic Services operational officer of [place] or Organizations: Not on file    Attends Archivist Meetings: Not on file    Marital Status: Not on file  Intimate Partner Violence: Not At Risk (06/08/2022)   Humiliation, Afraid, Rape, and Kick  questionnaire    Fear of Current or Ex-Partner: No    Emotionally Abused: No    Physically Abused: No    Sexually Abused: No     Outpatient Medications Prior to Visit  Medication Sig Dispense Refill   albuterol (VENTOLIN HFA) 108 (90 Base) MCG/ACT inhaler TAKE 2 PUFFS BY MOUTH EVERY 6 HOURS AS NEEDED FOR WHEEZE OR SHORTNESS OF BREATH 18 each 11   aspirin 81 MG tablet Take 81 mg by mouth daily.     Bempedoic Acid-Ezetimibe (  NEXLIZET) 180-10 MG TABS Take 1 tablet by mouth daily. 30 tablet 6   blood glucose meter kit and supplies KIT Dispense based on patient and insurance preference. Use up to four times daily as directed. One Touch Ultra mini meter (FOR ICD-9 250.00, 250.01). Dx: E11.9 1 each 0   carvedilol (COREG) 12.5 MG tablet TAKE 1 TABLET BY MOUTH TWICE A DAY 180 tablet 2   cholecalciferol (VITAMIN D) 25 MCG (1000 UT) tablet Take 1,000 Units by mouth daily.     dapagliflozin propanediol (FARXIGA) 10 MG TABS tablet Take 10 mg by mouth daily.     Evolocumab (REPATHA SURECLICK) [id number] MG/ML SOAJ Inject 140 mg into the skin every 14 (fourteen) days. 2 mL 6   furosemide (LASIX) 20 MG tablet TAKE 1 TABLET (20 MG TOTAL) BY MOUTH DAILY AS NEEDED (FOR >2LB WEIGHT GAIN OVERNIGHT OR 5 LB WEIGHT GAIN IN 1 WEEK OR SWELLING.). 90 tablet 3   glucose blood (ONE TOUCH ULTRA TEST) test strip USE TO TEST BLOOD SUGAR UP TO 4 TIMES DAILY 100 each 6   insulin glargine (LANTUS SOLOSTAR) 100 UNIT/ML Solostar Pen Inject 30 Units into the skin daily. 15 mL 0   JARDIANCE 25 MG TABS tablet Take 25 mg by mouth daily.     loratadine (CLARITIN) 5 MG chewable tablet Chew 1 tablet (5 mg total) by mouth daily. 30 tablet 1   losartan (COZAAR) 100 MG tablet TAKE 1/2 TABLET (50 MG TOTAL) BY MOUTH 2 (TWO) TIMES DAILY. 90 tablet 1   meclizine (ANTIVERT) 12.5 MG tablet Take 1 tablet (12.5 mg total) by mouth 3 (three) times daily as needed for dizziness. 30 tablet 0   MOUNJARO 5 MG/0.5ML Pen SMARTSIG:0.5 Milliliter(s) SUB-Q Once a  Week     nitroGLYCERIN (NITROSTAT) 0.4 MG SL tablet PLACE 1 TABLET (0.4 MG TOTAL) UNDER THE TONGUE EVERY 5 (FIVE) MINUTES AS NEEDED FOR CHEST PAIN. MAXIMUM OF 3 DOSES. 25 tablet 3   NOVOFINE PEN NEEDLE 32G X 6 MM MISC USE AS DIRECTED WITH LANTUS PEN 123XX123 each 2   TRULICITY 1.5 0000000 SOPN Inject 1.5 mg as directed once a week.  11   vitamin B-12 (CYANOCOBALAMIN) 1000 MCG tablet Take 1,000 mcg by mouth daily.     No facility-administered medications prior to visit.    Allergies  Allergen Reactions   Prednisone     Other reaction(s): Dizziness dizzy   Advil [Ibuprofen] Other (See Comments)    Dizziness    Aleve [Naproxen] Other (See Comments)    Dizziness   Fluticasone-Salmeterol Other (See Comments)    dizziness   Lipitor [Atorvastatin] Other (See Comments)    Legs weak.  Leg pain, muscle ache   Naproxen Sodium Other (See Comments)   Penicillins Hives and Other (See Comments)    Has patient had a PCN reaction causing immediate rash, facial/tongue/throat swelling, SOB or lightheadedness with hypotension: YES Has patient had a PCN reaction causing severe rash involving mucus membranes or skin necrosis: no Has patient had a PCN reaction that required hospitalization: no Has patient had a PCN reaction occurring within the last 10 years: no If all of the above answers are "NO", then may proceed with Cephalosporin use.     ROS Review of Systems  Constitutional: Negative.   HENT:  Positive for postnasal drip, sinus pressure and sore throat.   Eyes: Negative.   Respiratory:  Positive for cough.   Cardiovascular: Negative.   Neurological: Negative.   Psychiatric/Behavioral: Negative.  Objective:    Physical Exam Constitutional:      Appearance: She is well-developed.  HENT:     Head: Normocephalic.     Right Ear: Tympanic membrane normal.     Left Ear: Tympanic membrane normal.     Nose:     Right Sinus: Maxillary sinus tenderness present.     Left Sinus: Maxillary  sinus tenderness present.     Mouth/Throat:     Mouth: Mucous membranes are moist.     Tonsils: No tonsillar exudate or tonsillar abscesses. 0 on the right. 0 on the left.  Cardiovascular:     Rate and Rhythm: Normal rate and regular rhythm.     Heart sounds: Normal heart sounds. No murmur heard.    No friction rub.  Pulmonary:     Effort: Pulmonary effort is normal.     Breath sounds: Normal breath sounds.  Neurological:     General: No focal deficit present.     Mental Status: She is alert and oriented to person, place, and time.  Psychiatric:        Mood and Affect: Mood normal.        Behavior: Behavior normal.     BP 118/72   Pulse 83   Temp 98.2 F (36.8 C)   Ht 5' 2.5" (1.588 m)   Wt 150 lb 9.6 oz (68.3 kg)   SpO2 98%   BMI 27.11 kg/m  Wt Readings from Last 3 Encounters:  09/30/22 150 lb 9.6 oz (68.3 kg)  09/07/22 155 lb (70.3 kg)  08/25/22 154 lb 2 oz (69.9 kg)     Health Maintenance  Topic Date Due   DTaP/Tdap/Td (1 - Tdap) Never done   Diabetic kidney evaluation - Urine ACR  02/14/2016   FOOT EXAM  09/03/2022   HEMOGLOBIN A1C  09/17/2022   COVID-19 Vaccine (8 - 2023-24 season) 10/16/2022 (Originally 03/05/2022)   Medicare Annual Wellness (AWV)  06/09/2023   OPHTHALMOLOGY EXAM  06/15/2023   Diabetic kidney evaluation - eGFR measurement  09/07/2023   INFLUENZA VACCINE  Completed   DEXA SCAN  Completed   HPV VACCINES  Aged Out   Pneumonia Vaccine 21+ Years old  Discontinued   Zoster Vaccines- Shingrix  Discontinued    There are no preventive care reminders to display for this patient.  Lab Results  Component Value Date   TSH 5.02 (H) 05/16/2018   Lab Results  Component Value Date   WBC 10.3 05/28/2022   HGB 14.5 05/28/2022   HCT 42.6 05/28/2022   MCV 87.8 05/28/2022   PLT 200 05/28/2022   Lab Results  Component Value Date   NA 136 09/07/2022   K 4.0 09/07/2022   CO2 27 09/07/2022   GLUCOSE 110 (H) 09/07/2022   BUN 22 09/07/2022    CREATININE 1.59 (H) 09/07/2022   BILITOT 0.6 09/07/2022   ALKPHOS 63 09/07/2022   AST 14 09/07/2022   ALT 11 09/07/2022   PROT 6.0 09/07/2022   ALBUMIN 3.8 09/07/2022   CALCIUM 9.8 09/07/2022   ANIONGAP 13 05/28/2022   GFR 30.29 (L) 09/07/2022   Lab Results  Component Value Date   CHOL 206 (H) 05/05/2022   Lab Results  Component Value Date   HDL 56 05/05/2022   Lab Results  Component Value Date   LDLCALC 134 (H) 05/05/2022   Lab Results  Component Value Date   TRIG 82 05/05/2022   Lab Results  Component Value Date   CHOLHDL 3.7 05/05/2022  Lab Results  Component Value Date   HGBA1C 6.6 (H) 03/19/2022      Assessment & Plan:  Acute non-recurrent maxillary sinusitis Assessment & Plan: Rx doxycycline and Hycodan sent to the pharmacy. Advised patient to continue Zyrtec. Advised to increase hydration and perform informed water and salt gargles. If symptoms do not improve please call the office back for further evaluation.   Other orders -     Doxycycline Hyclate; Take 1 tablet (100 mg total) by mouth 2 (two) times daily.  Dispense: 14 tablet; Refill: 0 -     HYDROcodone Bit-Homatrop MBr; Take 5 mLs by mouth every 8 (eight) hours as needed for cough.  Dispense: 120 mL; Refill: 0    Follow-up: Return if symptoms worsen or fail to improve.   Theresia Lo, NP

## 2022-09-30 NOTE — Patient Instructions (Addendum)
Doxycycline and hycodan sent to pharmacy. Continue zyrtec. Increase hydration and perform warm water and salt gargles.

## 2022-10-03 ENCOUNTER — Encounter: Payer: Self-pay | Admitting: Nurse Practitioner

## 2022-10-03 NOTE — Assessment & Plan Note (Signed)
Rx doxycycline and Hycodan sent to the pharmacy. Advised patient to continue Zyrtec. Advised to increase hydration and perform informed water and salt gargles. If symptoms do not improve please call the office back for further evaluation.

## 2022-10-09 ENCOUNTER — Encounter: Payer: Self-pay | Admitting: Pulmonary Disease

## 2022-10-18 ENCOUNTER — Telehealth: Payer: Self-pay | Admitting: Internal Medicine

## 2022-10-18 NOTE — Telephone Encounter (Signed)
Patient called cardiology requesting a 30 day supply of doxycycline.

## 2022-10-18 NOTE — Telephone Encounter (Signed)
*  STAT* If patient is at the pharmacy, call can be transferred to refill team.   1. Which medications need to be refilled? (please list name of each medication and dose if known)  doxycycline (VIBRA-TABS) 100 MG tablet  2. Which pharmacy/location (including street and city if local pharmacy) is medication to be sent to? [id number] - [place], [place] - [address]   3. Do they need a 30 day or 90 day supply? 30

## 2022-10-20 NOTE — Telephone Encounter (Signed)
Please call pt and her concern for requesting doxycycline.

## 2022-10-21 NOTE — Telephone Encounter (Signed)
Patient states she is still coughing and sneezing since her last visit with you which was on 09/30/22 for Acute non-recurrent maxillary sinusitis. Patient would like Doxycycline called in to the Pharmacy. Please advise

## 2022-10-24 NOTE — Telephone Encounter (Signed)
If she is not feeling better she need to be seen for further evaluation.

## 2022-10-25 NOTE — Telephone Encounter (Signed)
Spoke to Patient she states she is better with using an allergy pill and her inhaler PRN

## 2022-10-31 ENCOUNTER — Other Ambulatory Visit: Payer: Self-pay | Admitting: Nurse Practitioner

## 2022-11-03 ENCOUNTER — Telehealth: Payer: Self-pay | Admitting: Pharmacist

## 2022-11-03 ENCOUNTER — Encounter: Payer: Self-pay | Admitting: Internal Medicine

## 2022-11-03 ENCOUNTER — Ambulatory Visit: Payer: Medicare PPO | Attending: Internal Medicine | Admitting: Internal Medicine

## 2022-11-03 ENCOUNTER — Other Ambulatory Visit
Admission: RE | Admit: 2022-11-03 | Discharge: 2022-11-03 | Disposition: A | Discharge status: Home / Self Care | Payer: Medicare PPO | Source: Ambulatory Visit | Attending: Internal Medicine | Admitting: Internal Medicine

## 2022-11-03 VITALS — BP 148/72 | HR 72 | Ht 62.5 in | Wt 145.8 lb

## 2022-11-03 DIAGNOSIS — Z5181 Encounter for therapeutic drug level monitoring: Secondary | ICD-10-CM

## 2022-11-03 DIAGNOSIS — Z79899 Other long term (current) drug therapy: Secondary | ICD-10-CM

## 2022-11-03 DIAGNOSIS — I2510 Atherosclerotic heart disease of native coronary artery without angina pectoris: Secondary | ICD-10-CM

## 2022-11-03 DIAGNOSIS — I739 Peripheral vascular disease, unspecified: Secondary | ICD-10-CM

## 2022-11-03 DIAGNOSIS — E1169 Type 2 diabetes mellitus with other specified complication: Secondary | ICD-10-CM

## 2022-11-03 LAB — ALT: ALT: 12 U/L (ref 0–44)

## 2022-11-03 LAB — LIPID PANEL
Cholesterol: 143 mg/dL (ref 0–200)
HDL: 59 mg/dL (ref >40)
LDL Cholesterol: 68 mg/dL (ref 0–99)
Total CHOL/HDL Ratio: 2.4 RATIO
Triglycerides: 82 mg/dL (ref <150)
VLDL: 16 mg/dL (ref 0–40)

## 2022-11-03 MED ORDER — REPATHA SURECLICK 140 MG/ML ~~LOC~~ SOAJ: 140.0000 mg | SUBCUTANEOUS | 0 refills | Status: DC

## 2022-11-03 NOTE — Patient Instructions (Signed)
Medication Instructions:  Your Physician recommend you continue on your current medication as directed.    Please call our office or send a My Chart message to clarify if you are taking Nexlizet and Repatha. *If you need a refill on your cardiac medications before your next appointment, please call your pharmacy*   Lab Work: Your provider would like for you to have following labs drawn: Lipid Panel and ALT.   Please go to the [facility] entrance and check in at the front desk.  You do not need an appointment.  They are open from 7am-6 pm.   If you have labs (blood work) drawn today and your tests are completely normal, you will receive your results only by: MyChart Message (if you have MyChart) OR A paper copy in the mail If you have any lab test that is abnormal or we need to change your treatment, we will call you to review the results.   Testing/Procedures: Your physician has requested that you have an ankle brachial index (ABI). During this test an ultrasound and blood pressure cuff are used to evaluate the arteries that supply the arms and legs with blood.  Allow thirty minutes for this exam.  There are no restrictions or special instructions.  This will take place at [address] [address], [place] [address]   Your physician has requested that you have a lower extremity arterial duplex. During this test, ultrasound is used to evaluate arterial blood flow in the legs. Allow one hour for this exam. There are no restrictions or special instructions. This will take place at [address] ([facility], [place] [address]    Follow-Up: At [facility], you and your health needs are our priority.  As part of our continuing mission to provide you with exceptional heart care, we have created designated Provider Care Teams.  These Care Teams include your primary Cardiologist (physician) and Advanced Practice Providers (APPs -   Physician Assistants and Nurse Practitioners) who all work together to provide you with the care you need, when you need it.  We recommend signing up for the patient portal called "MyChart".  Sign up information is provided on this After Visit Summary.  MyChart is used to connect with patients for Virtual Visits (Telemedicine).  Patients are able to view lab/test results, encounter notes, upcoming appointments, etc.  Non-urgent messages can be sent to your provider as well.   To learn more about what you can do with MyChart, go to [email or web address].    Your next appointment:   3 month(s)  Provider:   You may see Yvonne Kendall, MD or one of the following Advanced Practice Providers on your designated Care Team:   Nicolasa Ducking, NP Eula Listen, PA-C Cadence Fransico Michael, PA-C Charlsie Quest, NP

## 2022-11-03 NOTE — Telephone Encounter (Signed)
End, Cristal Deer, MD  P [facility] Good afternoon,  Ms. Herrmann has been doing fairly well with Repatha but reports that two of the doses failed to exit the syringe despite using the Sureclick and trying to manually empty the syringe.  Do you have any suggestions for for what she could do about this?  Alternatively, she is interested in a monthly autoinjector.  Is this available for Repatha as well or only Praluent?  If it is a possibility with Repatha, would you be able to help arrange for this and provide education to her?  Thanks for your help.  Thayer Ohm

## 2022-11-03 NOTE — Telephone Encounter (Signed)
Called pt to discuss. Once monthly Repatha auto-infusor is getting taken off the market in the next few months. Have had issues with pens malfunctioning in a decent # of other patients recently too, not sure if there was a bad batch that went out. I have sent in replacement rx to [facility] pharmacy who specifically handles sending out replacement Repatha pens when this issue arises. I have given her their phone # to contact if she doesn't hear from them in the next few days. She was appreciative for the assistance.

## 2022-11-03 NOTE — Progress Notes (Signed)
Follow-up Outpatient Visit Date: 11/03/2022  Primary Care Provider: Glori Luis, MD [address] [place] [address]  Chief Complaint: Follow-up nonischemic cardiomyopathy, CAD, and PAD  HPI:  [name] is a 82 y.o. female with history of nonobstructive coronary artery disease, peripheral vascular disease with small vessel disease involving the runoff vessels, nonischemic cardiomyopathy, mitral regurgitation, hypertension, hyperlipidemia, type 2 diabetes mellitus, mild to moderate renal artery stenosis, and breast cancer, who presents for follow-up of coronary artery disease, peripheral arterial disease, and HFrEF.  I last saw her in 05/2022, at which time she was feeling similar to prior visits.  She continued to have some discomfort in her calves, though this improved with discontinuation of rosuvastatin.  She remained on ezetimibe monotherapy.  We did not make any medication changes or pursue additional testing.  Today, [name] reports that she is feeling fairly well though she continues to recover from a sinus infection last month that was treated with doxycycline and cough suppressant.  She has a history of chronic sinus drainage that is now back to baseline.  She has not had any chest pain or dyspnea.  She continues to line dance regularly but is bothered by more "squeezing" pain in her calves.  Interestingly, this only happens when she is lying dances and not walks.  She is able to push mow her lawn without any symptoms.  She has occasional dizziness and feels off balance a lot of the time but has not fallen or passed out.  Home blood pressures have been somewhat labile.  She notes that she has not taken her medications for today yet and thinks that may be contributing to her elevated blood pressure here.  She has also been having some issues with Repatha; two of the injections did not seem to go into her skin.  She is also uncertain if she has been taking Nexlizet or  not.  --------------------------------------------------------------------------------------------------  Cardiovascular History & Procedures: Cardiovascular Problems: Nonobstructive coronary artery disease Chronic HFmrEF due to nonischemic cardiomyopathy Mitral regurgitation Claudication   Risk Factors: HTN, HLD, DM2, and age > 27   Cath/PCI: [facility] (04/21/2018): LMCA normal.  LAD with 50% mid vessel stenosis.  LCx proper without disease.  Dominant OM3 with 50-60% proximal stenosis.  RCA with 50% ostial stenosis.  Mildly elevated LVEDP (15 to 20 mmHg). Abdominal aortogram and runoff (04/21/2018): Aorta, inflow vessels, and outflow vessels.  There is tapering/occlusion of the distal anterior tibial and peroneal arteries on the right.  There is three-vessel runoff on the left.   CV Surgery: None   EP Procedures and Devices: None   Non-Invasive Evaluation(s): TTE (01/30/2020): Normal LV size with LVEF 40% and global hypokinesis.  Grade 1 diastolic dysfunction with elevated filling pressure.  Low normal RV function with normal size and wall thickness.  Mild left atrial enlargement.  Mild to moderate mitral regurgitation. TTE (03/14/18): Mildly dilated LV with LVEF 40-45%.  Global hypokinesis with grade 2 diastolic dysfunction.  Moderate MR and mild LA enlargement.  Normal RV size and function. ABIs (02/22/2018): Noncompressible runoff vessels bilaterally with triphasic waveforms.  TBI's are normal bilaterally. Renal artery Doppler (04/20/17): Mild to moderate (less than 60%) stenosis of the right renal artery.  No significant left renal artery disease. TTE (01/14/17): Normal LV size with LVEF 50-55%. Anteroseptal hypokinesis is noted, as well as grade 1 diastolic dysfunction. Mild to moderate MR. Normal RV size and function. Mild pulmonary hypertension. Pharmacologic myocardial perfusion stress test (06/16/16): Low risk  study with moderate in size, moderate in severity, fixed defect involving the  septum most likely related to LBBB. No evidence of ischemia. LVEF 50% by automated calculation and likely higher based on visual estimation. ABIs (06/10/16): ABIs: Right not obtainable, left 1.4. TBIs right 1.1, left not obtainable. Bilateral great toe PPG's are normal. Bilateral common femoral, popliteal, peroneal, anterior tibial, and posterior tibial artery waveforms are brisk and triphasic. TTE (02/23/16, [facility]): Mildly dilated left ventricle with mild to moderate LV dysfunction (EF 35-45%). Grade 1 diastolic dysfunction. Moderate mitral regurgitation. Mild left atrial enlargement. Normal RV function. TTE (01/01/14, [facility]): Moderate to severe LV dysfunction (EF 30%) with mild LVH and mild left ventricular dilation. Mitral annular calcification with moderate MR and moderate TR noted. Normal right ventricular contraction. MUGA (12/25/12): Normal contraction and wall motion. EF 63%.  Recent CV Pertinent Labs: Lab Results  Component Value Date   CHOL 206 (H) 05/05/2022   CHOL 156 08/01/2019   HDL 56 05/05/2022   HDL 45 08/01/2019   LDLCALC 134 (H) 05/05/2022   LDLCALC 88 08/01/2019   LDLDIRECT 64.0 09/02/2021   TRIG 82 05/05/2022   CHOLHDL 3.7 05/05/2022   K 4.0 09/07/2022   K 4.7 01/07/2014   MG 2.3 03/19/2022   BUN 22 09/07/2022   BUN 19 02/19/2019   BUN 19 (H) 01/07/2014   CREATININE 1.59 (H) 09/07/2022   CREATININE 1.24 01/07/2014    Past medical and surgical history were reviewed and updated in EPIC.  Current Meds  Medication Sig   albuterol (VENTOLIN HFA) 108 (90 Base) MCG/ACT inhaler TAKE 2 PUFFS BY MOUTH EVERY 6 HOURS AS NEEDED FOR WHEEZE OR SHORTNESS OF BREATH   aspirin 81 MG tablet Take 81 mg by mouth daily.   Bempedoic Acid-Ezetimibe (NEXLIZET) 180-10 MG TABS Take 1 tablet by mouth daily.   blood glucose meter kit and supplies KIT Dispense based on patient and insurance preference. Use up to four times daily as directed. One Touch Ultra mini meter (FOR  ICD-9 250.00, 250.01). Dx: E11.9   carvedilol (COREG) 12.5 MG tablet TAKE 1 TABLET BY MOUTH TWICE A DAY   cholecalciferol (VITAMIN D) 25 MCG (1000 UT) tablet Take 1,000 Units by mouth daily.   Evolocumab (REPATHA SURECLICK) 140 MG/ML SOAJ Inject 140 mg into the skin every 14 (fourteen) days.   furosemide (LASIX) 20 MG tablet TAKE 1 TABLET (20 MG TOTAL) BY MOUTH DAILY AS NEEDED (FOR >2LB WEIGHT GAIN OVERNIGHT OR 5 LB WEIGHT GAIN IN 1 WEEK OR SWELLING.).   glucose blood (ONE TOUCH ULTRA TEST) test strip USE TO TEST BLOOD SUGAR UP TO 4 TIMES DAILY   insulin glargine (LANTUS SOLOSTAR) 100 UNIT/ML Solostar Pen Inject 30 Units into the skin daily.   JARDIANCE 25 MG TABS tablet Take 25 mg by mouth daily.   loratadine (CLARITIN) 5 MG chewable tablet Chew 1 tablet (5 mg total) by mouth daily.   losartan (COZAAR) 100 MG tablet TAKE 1/2 TABLET (50 MG TOTAL) BY MOUTH 2 (TWO) TIMES DAILY.   meclizine (ANTIVERT) 12.5 MG tablet Take 1 tablet (12.5 mg total) by mouth 3 (three) times daily as needed for dizziness.   MOUNJARO 5 MG/0.5ML Pen SMARTSIG:0.5 Milliliter(s) SUB-Q Once a Week   nitroGLYCERIN (NITROSTAT) 0.4 MG SL tablet PLACE 1 TABLET (0.4 MG TOTAL) UNDER THE TONGUE EVERY 5 (FIVE) MINUTES AS NEEDED FOR CHEST PAIN. MAXIMUM OF 3 DOSES.   NOVOFINE PEN NEEDLE 32G X 6 MM MISC USE AS DIRECTED WITH LANTUS PEN  TRULICITY 1.5 MG/0.5ML SOPN Inject 1.5 mg as directed once a week.   vitamin B-12 (CYANOCOBALAMIN) 1000 MCG tablet Take 1,000 mcg by mouth daily.    Allergies: Prednisone, Advil [ibuprofen], Aleve [naproxen], Fluticasone-salmeterol, Lipitor [atorvastatin], Naproxen sodium, and Penicillins  Social History   Tobacco Use   Smoking status: Former    Packs/day: 0.25    Years: 1.00    Additional pack years: 0.00    Total pack years: 0.25    Types: Cigarettes    Quit date: 1963    Years since quitting: 61.3    Passive exposure: Never   Smokeless tobacco: Never  Vaping Use   Vaping Use: Never used   Substance Use Topics   Alcohol use: No    Alcohol/week: 0.0 standard drinks of alcohol   Drug use: No    Family History  Problem Relation Age of Onset   Alcohol abuse Mother    Heart disease Mother    Hypertension Mother    Breast cancer Mother 35   Alcohol abuse Father    Heart disease Father    Hypertension Father    Heart disease Brother    Heart attack Brother     Review of Systems: A 12-system review of systems was performed and was negative except as noted in the HPI.  --------------------------------------------------------------------------------------------------  Physical Exam: BP (!) 148/72 (BP Location: Left Arm, Patient Position: Sitting, Cuff Size: Normal)   Pulse 72   Ht 5' 2.5" (1.588 m)   Wt 145 lb 12.8 oz (66.1 kg)   SpO2 96%   BMI 26.24 kg/m  Repeat BP: 160/68  General:  NAD. Neck: No JVD or HJR. Lungs: Clear to auscultation bilaterally without wheezes or crackles. Heart: Regular rate and rhythm without murmurs, rubs, or gallops. Abdomen: Soft, nontender, nondistended. Extremities: No lower extremity edema.  2+ dorsalis pedis and 1+ posterior tibial pulses bilaterally.  EKG: Normal sinus rhythm with left bundle branch block.  No significant change from prior tracing on 05/28/2022.  Lab Results  Component Value Date   WBC 10.3 05/28/2022   HGB 14.5 05/28/2022   HCT 42.6 05/28/2022   MCV 87.8 05/28/2022   PLT 200 05/28/2022    Lab Results  Component Value Date   NA 136 09/07/2022   K 4.0 09/07/2022   CL 100 09/07/2022   CO2 27 09/07/2022   BUN 22 09/07/2022   CREATININE 1.59 (H) 09/07/2022   GLUCOSE 110 (H) 09/07/2022   ALT 11 09/07/2022    Lab Results  Component Value Date   CHOL 206 (H) 05/05/2022   HDL 56 05/05/2022   LDLCALC 134 (H) 05/05/2022   LDLDIRECT 64.0 09/02/2021   TRIG 82 05/05/2022   CHOLHDL 3.7 05/05/2022     --------------------------------------------------------------------------------------------------  ASSESSMENT AND PLAN: Chronic HFrEF due to nonischemic cardiomyopathy: [name] does not report any shortness of breath or significant edema though her functional capacity remains somewhat limited by back pain and calf discomfort.  Overall, she reports NYHA class II symptoms.  We will continue current regimen of carvedilol, losartan, and SGLT2 inhibitor (she is in the process of switching between dapagliflozin and empagliflozin due to insurance constraints).  If blood pressure remains elevated, escalation of carvedilol could be considered in the future.  Coronary artery disease: Nonobstructive CAD previously noted.  Continue aspirin and lipid therapy.  Claudication and peripheral vascular disease: [name] reports progressive calf pain with line dancing though she is able to mow her lawn without any difficulty.  Prior angiogram demonstrated  small vessel disease.  We will plan to repeat ABIs for further evaluation.  Continue aspirin and lipid therapy.  Hyperlipidemia associated with type 2 diabetes mellitus: [name] has been having some issues with her Repatha SureClick dispenser but is otherwise tolerating the medication well.  She does not recall if she is actually taking Nexlizet or not.  I have asked her to look at her medications at home and inform us about whether or not she is taking Nexlizet.  We will check a lipid panel and ALT today.  If she is not using next Lizette and her LDL is at goal, I think it would be reasonable to forego Nexlizet and continue with Repatha.  I will reach out to our pharmacy team to see if they can assess why [name] has been having some issues with administration of Repatha.  Ongoing management of DM per Dr. Tedd Sias.  Follow-up: Return to clinic in 3 months.  Yvonne Kendall, MD 11/03/2022 9:16 AM

## 2022-11-05 ENCOUNTER — Other Ambulatory Visit: Payer: Self-pay

## 2022-11-05 MED ORDER — EZETIMIBE 10 MG PO TABS: 10.0000 mg | ORAL_TABLET | Freq: Every day | ORAL | 3 refills | Status: DC

## 2022-11-18 ENCOUNTER — Inpatient Hospital Stay: Payer: Medicare PPO | Admitting: Oncology

## 2022-11-18 ENCOUNTER — Inpatient Hospital Stay: Payer: Medicare PPO | Attending: Oncology

## 2022-11-18 ENCOUNTER — Encounter: Payer: Self-pay | Admitting: Oncology

## 2022-11-18 VITALS — BP 139/56 | HR 71 | Temp 98.3°F | Resp 18 | Wt 146.0 lb

## 2022-11-18 DIAGNOSIS — Z853 Personal history of malignant neoplasm of breast: Secondary | ICD-10-CM | POA: Diagnosis not present

## 2022-11-18 DIAGNOSIS — R059 Cough, unspecified: Secondary | ICD-10-CM | POA: Insufficient documentation

## 2022-11-18 DIAGNOSIS — R771 Abnormality of globulin: Secondary | ICD-10-CM | POA: Diagnosis not present

## 2022-11-18 DIAGNOSIS — N1832 Chronic kidney disease, stage 3b: Secondary | ICD-10-CM

## 2022-11-18 DIAGNOSIS — I509 Heart failure, unspecified: Secondary | ICD-10-CM | POA: Diagnosis not present

## 2022-11-18 DIAGNOSIS — M810 Age-related osteoporosis without current pathological fracture: Secondary | ICD-10-CM | POA: Insufficient documentation

## 2022-11-18 DIAGNOSIS — Z90710 Acquired absence of both cervix and uterus: Secondary | ICD-10-CM | POA: Diagnosis not present

## 2022-11-18 DIAGNOSIS — E1122 Type 2 diabetes mellitus with diabetic chronic kidney disease: Secondary | ICD-10-CM | POA: Diagnosis not present

## 2022-11-18 DIAGNOSIS — Z170 Estrogen receptor positive status [ER+]: Secondary | ICD-10-CM

## 2022-11-18 DIAGNOSIS — Z87891 Personal history of nicotine dependence: Secondary | ICD-10-CM | POA: Diagnosis not present

## 2022-11-18 DIAGNOSIS — Z803 Family history of malignant neoplasm of breast: Secondary | ICD-10-CM | POA: Insufficient documentation

## 2022-11-18 DIAGNOSIS — C50911 Malignant neoplasm of unspecified site of right female breast: Secondary | ICD-10-CM

## 2022-11-18 DIAGNOSIS — I130 Hypertensive heart and chronic kidney disease with heart failure and stage 1 through stage 4 chronic kidney disease, or unspecified chronic kidney disease: Secondary | ICD-10-CM | POA: Insufficient documentation

## 2022-11-18 DIAGNOSIS — Z90721 Acquired absence of ovaries, unilateral: Secondary | ICD-10-CM | POA: Diagnosis not present

## 2022-11-18 LAB — CBC WITH DIFFERENTIAL/PLATELET
Abs Immature Granulocytes: 0.07 10*3/uL (ref 0.00–0.07)
Basophils Absolute: 0.1 10*3/uL (ref 0.0–0.1)
Basophils Relative: 1 %
Eosinophils Absolute: 0.6 10*3/uL — ABNORMAL HIGH (ref 0.0–0.5)
Eosinophils Relative: 7 %
HCT: 36.8 % (ref 36.0–46.0)
Hemoglobin: 12.3 g/dL (ref 12.0–15.0)
Immature Granulocytes: 1 %
Lymphocytes Relative: 19 %
Lymphs Abs: 1.5 10*3/uL (ref 0.7–4.0)
MCH: 30.5 pg (ref 26.0–34.0)
MCHC: 33.4 g/dL (ref 30.0–36.0)
MCV: 91.3 fL (ref 80.0–100.0)
Monocytes Absolute: 0.5 10*3/uL (ref 0.1–1.0)
Monocytes Relative: 7 %
Neutro Abs: 5.1 10*3/uL (ref 1.7–7.7)
Neutrophils Relative %: 65 %
Platelets: 171 10*3/uL (ref 150–400)
RBC: 4.03 MIL/uL (ref 3.87–5.11)
RDW: 12.4 % (ref 11.5–15.5)
WBC: 7.8 10*3/uL (ref 4.0–10.5)
nRBC: 0 % (ref 0.0–0.2)

## 2022-11-18 LAB — COMPREHENSIVE METABOLIC PANEL
ALT: 14 U/L (ref 0–44)
AST: 18 U/L (ref 15–41)
Albumin: 4.1 g/dL (ref 3.5–5.0)
Alkaline Phosphatase: 55 U/L (ref 38–126)
Anion gap: 8 (ref 5–15)
BUN: 29 mg/dL — ABNORMAL HIGH (ref 8–23)
CO2: 27 mmol/L (ref 22–32)
Calcium: 8.8 mg/dL — ABNORMAL LOW (ref 8.9–10.3)
Chloride: 102 mmol/L (ref 98–111)
Creatinine, Ser: 1.8 mg/dL — ABNORMAL HIGH (ref 0.44–1.00)
GFR, Estimated: 28 mL/min — ABNORMAL LOW (ref >60)
Glucose, Bld: 92 mg/dL (ref 70–99)
Potassium: 4.4 mmol/L (ref 3.5–5.1)
Sodium: 137 mmol/L (ref 135–145)
Total Bilirubin: 0.8 mg/dL (ref 0.3–1.2)
Total Protein: 6.5 g/dL (ref 6.5–8.1)

## 2022-11-18 NOTE — Progress Notes (Signed)
Hematology/Oncology Consult Note [phone] [phone]   Clinic day:  11/18/2022  Chief Complaint: Kelsey Mason is a 82 y.o. female presents for follow up of bilateral stage IA breast cancer, hypoglobulinemia  ASSESSMENT & PLAN:   Malignant neoplasm of right breast in female, estrogen receptor positive (HCC) # History of breast cancer- 2014 Patient has been off letrozole. Clinically she is doing well.  Recommend  annual screening mammogram in November 2024 She elects to follow up with PCP and she will ask PCP to order annual screening mammogram.   Chronic kidney disease, stage 3b (HCC) Encourage oral hydration and avoid nephrotoxins.    Hypoglobulinemia #Hypoglobulinemia Status post IVIG 25 g x 1 in the past  Patient declined additional IVIG infusion at this point.  Osteoporosis #Osteoporosis, left femoral neck, discussed with patient about the recommendation of Prolia every 6 months.- Patient declined Prolia.  Recommend patient to continue calcium and vitamin D supplementation  No orders of the defined types were placed in this encounter.  Patient is discharged from my clinic. I recommend patient to continue follow up with primary care physician. Patient may re-establish care in the future if clinically indicated.  All questions were answered. The patient knows to call the clinic with any problems, questions or concerns.  Rickard Patience, MD, PhD [facility] Hematology Oncology 11/18/2022   . PERTINENT ONCOLOGY HISTORY Kelsey Mason is a 82 y.o.afemale who has above oncology history reviewed by me today presented for follow up visit for management of history of bilateral breast cancer. Patient previously follows up with Dr. Merlene Pulling. Switched care to me on 09/18/2018. Medical record review was performed by me.  bilateral breast cancer s/p lumpectomy and sentinel lymph node biopsy on 11/17/2012.  Right breast revealed a 5 mm grade I  invasive carcinoma.  There was no DCIS.  Two sentinel lymph nodes were negative.  Pathologic stage was T1aN0M0.  Tumor was ER + (90%), PR + (20%), and Her2/neu -.   Left breast revealed a 1.8 cm grade III invasive ductal carcinoma.  There was lymphovascular invasion.  There was no DCIS.  One sentinel lymph node was negative.  Pathologic stage was T1cN0M0.  Tumor was ER + (>90%), PR + (1-5%), and Her2/neu -.  Oncotype DX testing on the left breast mass revealed a recurrence score of 28 which corresponded to a 10 year risk of distant recurrence of 19% (CI 15-23%) with tamoxifen alone.  BCI testing on 10/04/2017 revealed a high risk of late recurrence (5.4%; CI 2.5%-8.2%) during years 5-10.  There was a low likelihood of benefit from extended endocrine therapy.  She received Adriamycin and Cytoxan (AC) x 4 cycles followed by weekly Taxol x 10 (completed 06/05/2013).  She did not receive 2 cycles of Taxol secondary to progressive neuropathy.  She received bilateral breast radiation (07/2013 - 09/2013).  She began letrozole (Femara) in 09/2013.  CA27.29 has been followed:  24.4 on 09/10/2016, 28.1 on 03/14/2017, 24.9 on 09/12/2017, and 22.8 on 03/13/2018. She has a history of a borderline mild normocytic anemia.  Diet appears modest.  She denies any melena, hematochezia, hematuria or vaginal bleeding. Colonoscopy on 10/13/2015 was negative.    Soft tissue neck ultrasound on 03/16/2017 revealed no fluid collection or soft tissue lesion.  09/2013- 12/2019 Letrozole, which was discontinued due to worsening of osteoporosis and  Her BCI was obtained previously 10/04/2017 revealed a high risk of late recurrence (5.4%; CI 2.5%-8.2%) during years 5-10.  There was a low likelihood of  benefit from extended endocrine therapy bone density done on 12/20/2019 Her T score was -2.3 in 2019 now left femoral neck T score at -2.5. She has progressed to osteoporosis state. I have previously discussed with patient about  dental clearance and bisphosphonate use.  At that time, patient decided not to proceed with intervention due to COVID-19 pandemic   Per Dr. Richardson Landry, patient has had recurrent sinopulmonary infections.  09/02/2021 immunoglobulin level has been checked recently which showed decreased IgG level 488.  09/27/2021, CT chest without contrast showed patchy groundglass infiltration in the right middle lobe and both lower lobes suggesting multifocal pneumonia and 2.2 x 0.6 cm linear nodular density in the left lower lobe which may be part of atelectasis/pneumonia underlying neoplastic process.  Recommend short-term CT follow-up.  Extensive coronary artery calcification.  Patient reports mild occasional cough which she feels is allergy related.  She also has some runny nose.  Patient will see pulmonology Dr. Jayme Cloud for evaluation.  10/05/2021 IVIG 25g x 1.  She tolerated well  INTERVAL HISTORY Kelsey Mason is a 82 y.o. female who has above history reviewed by me today presents for follow up visit for history of breast cancer, and hypoglobulinemia.   Today she has no new complaints.  She reports to have recurrent sinusitis.   Review of Systems  Constitutional:  Negative for appetite change, chills, fatigue and fever.  HENT:   Negative for hearing loss and voice change.   Eyes:  Negative for eye problems.  Respiratory:  Negative for chest tightness and cough.   Cardiovascular:  Negative for chest pain.  Gastrointestinal:  Negative for abdominal distention, abdominal pain, blood in stool and nausea.  Endocrine: Negative for hot flashes.  Genitourinary:  Negative for difficulty urinating and frequency.   Musculoskeletal:  Positive for arthralgias.  Skin:  Negative for itching and rash.  Neurological:  Negative for extremity weakness and headaches.  Hematological:  Negative for adenopathy.  Psychiatric/Behavioral:  Negative for confusion.      Past Medical History:  Diagnosis Date    Arthritis    fingers   Breast symptom 2014   Cancer [facility]) 2014   Bilateral Breast, chemo Dr. Koleen Nimrod   Chronic HFmrEF (heart failure with mid-range ejection fraction) (HCC)    a. 01/2017 Echo: EF 50-55%; b. 03/2018 Echo: EF 40-45%; c. 01/2020 Echo: EF 40%, glob HK, mild LVH, GrI DD.   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 01/2020 mild-mod MR.   NICM (nonischemic cardiomyopathy) (HCC)    a. 01/2017 Echo: EF 50-55%; b. 03/2018 Echo: EF 40-45%; c. 01/2020 Echo: EF 40%, glob HK, mild LVH, GrI DD, nl RV fxn, mildly dil LA, mild to mod MR.   Nonobstructive CAD (coronary artery disease)    a. 06/2016 MV: low risk; b. 04/2018 Cath: LM nl, LAD 66m, D1/2/3 nl, LCX nl, OM3 55ost Ca2+, RCA 50ost Ca2+.   PAD (peripheral artery disease) (HCC)    a. 04/2018 Lower ext angio: L pop 30m, R AT 100, R Peroneal 100d.   Personal history of chemotherapy    Personal history of radiation therapy 2014   bilat lumpectomy    Past Surgical History:  Procedure Laterality Date   ABDOMINAL AORTOGRAM W/LOWER EXTREMITY N/A 04/21/2018   Procedure: ABDOMINAL AORTOGRAM W/LOWER EXTREMITY;  Surgeon: Yvonne Kendall, MD;  Location: [facility] INVAS[facility];  Service: Cardiovascular;  Laterality: N/A;   ABDOMINAL HYSTERECTOMY  1976   for pelvic pain   BREAST EXCISIONAL BIOPSY Bilateral 11/2012   bilat breast ca chemo and rad   BREAST SURGERY Bilateral 2014   bilateral lumpectomy and SN biopsy   CATARACT EXTRACTION W/PHACO Left 07/30/2020   Procedure: CATARACT EXTRACTION PHACO AND INTRAOCULAR LENS PLACEMENT (IOC) LEFT DIABETIC 18.73 01:45.0 17.8%;  Surgeon: Lockie Mola, MD;  Location: [facility];  Service: Ophthalmology;  Laterality: Left;  Diabetic - insulin and opral meds   CATARACT EXTRACTION W/PHACO Right 08/13/2020   Procedure: CATARACT  EXTRACTION PHACO AND INTRAOCULAR LENS PLACEMENT (IOC) RIGHT DIABETIC;  Surgeon: Lockie Mola, MD;  Location: [facility];  Service: Ophthalmology;  Laterality: Right;  10.67 1:27.8 12.1%   CHOLECYSTECTOMY  2007   COLONOSCOPY WITH PROPOFOL N/A 10/13/2015   Procedure: COLONOSCOPY WITH PROPOFOL;  Surgeon: Scot Jun, MD;  Location: [facility] ENDOSCOPY;  Service: Endoscopy;  Laterality: N/A;   LEFT HEART CATH AND CORONARY ANGIOGRAPHY N/A 04/21/2018   Procedure: LEFT HEART CATH AND CORONARY ANGIOGRAPHY;  Surgeon: Yvonne Kendall, MD;  Location: [facility] INVASIVE CV LAB;  Service: Cardiovascular;  Laterality: N/A;   OOPHORECTOMY Right    PORTACATH PLACEMENT  2014   TUMOR REMOVAL Right 1980   right foot    Family History  Problem Relation Age of Onset   Alcohol abuse Mother    Heart disease Mother    Hypertension Mother    Breast cancer Mother 73   Alcohol abuse Father    Heart disease Father    Hypertension Father    Heart disease Brother    Heart attack Brother    Social History   Socioeconomic History   Marital status: Widowed    Spouse name: Not on file   Number of children: Not on file   Years of education: Not on file   Highest education level: Not on file  Occupational History   Not on file  Tobacco Use   Smoking status: Former    Packs/day: 0.25    Years: 1.00    Additional pack years: 0.00    Total pack years: 0.25    Types: Cigarettes    Quit date: 1963    Years since quitting: 61.4    Passive exposure: Never   Smokeless tobacco: Never  Vaping Use   Vaping Use: Never used  Substance and Sexual Activity   Alcohol use: No    Alcohol/week: 0.0 standard drinks of alcohol   Drug use: No   Sexual activity: Never  Other Topics Concern   Not on file  Social History Narrative   Lives in [place].       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   Diet - regular   Exercise - dance 3x per week   Social Determinants of Health   Financial Resource Strain: Low Risk   (06/08/2022)   Overall Financial Resource Strain (CARDIA)    Difficulty of Paying Living Expenses: Not hard at all  Food Insecurity: No Food Insecurity (06/08/2022)   Hunger Vital Sign    Worried About Running Out of Food in the Last Year: Never true    Ran Out of Food in the Last Year: Never true  Transportation Needs: No Transportation Needs (06/08/2022)   PRAPARE - Administrator, Civil Service (Medical): No    Lack of Transportation (Non-Medical): No  Physical Activity: Sufficiently Active (06/08/2022)  Exercise Vital Sign    Days of Exercise per Week: 2 days    Minutes of Exercise per Session: 150+ min  Stress: No Stress Concern Present (06/08/2022)   [employer] of Occupational Health - Occupational Stress Questionnaire    Feeling of Stress : Not at all  Social Connections: Unknown (06/08/2022)   Social Connection and Isolation Panel [NHANES]    Frequency of Communication with Friends and Family: More than three times a week    Frequency of Social Gatherings with Friends and Family: Twice a week    Attends Religious Services: Not on Marketing executive or Organizations: Not on file    Attends Banker Meetings: Not on file    Marital Status: Not on file  Intimate Partner Violence: Not At Risk (06/08/2022)   Humiliation, Afraid, Rape, and Kick questionnaire    Fear of Current or Ex-Partner: No    Emotionally Abused: No    Physically Abused: No    Sexually Abused: No     She lives by herself in [place].  Husband passed away many years ago.  The patient is alone today.  Allergies:  Allergies  Allergen Reactions   Prednisone     Other reaction(s): Dizziness dizzy   Advil [Ibuprofen] Other (See Comments)    Dizziness    Aleve [Naproxen] Other (See Comments)    Dizziness   Fluticasone-Salmeterol Other (See Comments)    dizziness   Lipitor [Atorvastatin] Other (See Comments)    Legs weak.  Leg pain, muscle ache   Naproxen Sodium  Other (See Comments)   Penicillins Hives and Other (See Comments)    Has patient had a PCN reaction causing immediate rash, facial/tongue/throat swelling, SOB or lightheadedness with hypotension: YES Has patient had a PCN reaction causing severe rash involving mucus membranes or skin necrosis: no Has patient had a PCN reaction that required hospitalization: no Has patient had a PCN reaction occurring within the last 10 years: no If all of the above answers are "NO", then may proceed with Cephalosporin use.     Current Medications: Current Outpatient Medications  Medication Sig Dispense Refill   albuterol (VENTOLIN HFA) 108 (90 Base) MCG/ACT inhaler TAKE 2 PUFFS BY MOUTH EVERY 6 HOURS AS NEEDED FOR WHEEZE OR SHORTNESS OF BREATH 18 each 11   aspirin 81 MG tablet Take 81 mg by mouth daily.     blood glucose meter kit and supplies KIT Dispense based on patient and insurance preference. Use up to four times daily as directed. One Touch Ultra mini meter (FOR ICD-9 250.00, 250.01). Dx: E11.9 1 each 0   carvedilol (COREG) 12.5 MG tablet TAKE 1 TABLET BY MOUTH TWICE A DAY 180 tablet 2   cholecalciferol (VITAMIN D) 25 MCG (1000 UT) tablet Take 1,000 Units by mouth daily.     dapagliflozin propanediol (FARXIGA) 10 MG TABS tablet Take 10 mg by mouth daily.     Evolocumab (REPATHA SURECLICK) 140 MG/ML SOAJ Inject 140 mg into the skin every 14 (fourteen) days. 2 mL 6   ezetimibe (ZETIA) 10 MG tablet Take 1 tablet (10 mg total) by mouth daily. 90 tablet 3   furosemide (LASIX) 20 MG tablet TAKE 1 TABLET (20 MG TOTAL) BY MOUTH DAILY AS NEEDED (FOR >2LB WEIGHT GAIN OVERNIGHT OR 5 LB WEIGHT GAIN IN 1 WEEK OR SWELLING.). 90 tablet 3   glucose blood (ONE TOUCH ULTRA TEST) test strip USE TO TEST BLOOD SUGAR UP TO 4 TIMES  DAILY 100 each 6   insulin glargine (LANTUS SOLOSTAR) 100 UNIT/ML Solostar Pen Inject 30 Units into the skin daily. 15 mL 0   losartan (COZAAR) 100 MG tablet TAKE 1/2 TABLET (50 MG TOTAL) BY MOUTH  2 (TWO) TIMES DAILY. 90 tablet 1   meclizine (ANTIVERT) 12.5 MG tablet Take 1 tablet (12.5 mg total) by mouth 3 (three) times daily as needed for dizziness. 30 tablet 0   MOUNJARO 5 MG/0.5ML Pen SMARTSIG:0.5 Milliliter(s) SUB-Q Once a Week     nitroGLYCERIN (NITROSTAT) 0.4 MG SL tablet PLACE 1 TABLET (0.4 MG TOTAL) UNDER THE TONGUE EVERY 5 (FIVE) MINUTES AS NEEDED FOR CHEST PAIN. MAXIMUM OF 3 DOSES. 25 tablet 3   NOVOFINE PEN NEEDLE 32G X 6 MM MISC USE AS DIRECTED WITH LANTUS PEN 100 each 2   TRULICITY 1.5 MG/0.5ML SOPN Inject 1.5 mg as directed once a week.  11   vitamin B-12 (CYANOCOBALAMIN) 1000 MCG tablet Take 1,000 mcg by mouth daily.     doxycycline (VIBRA-TABS) 100 MG tablet Take 1 tablet (100 mg total) by mouth 2 (two) times daily. (Patient not taking: Reported on 11/03/2022) 14 tablet 0   HYDROcodone bit-homatropine (HYCODAN) 5-1.5 MG/5ML syrup Take 5 mLs by mouth every 8 (eight) hours as needed for cough. (Patient not taking: Reported on 11/03/2022) 120 mL 0   JARDIANCE 25 MG TABS tablet Take 25 mg by mouth daily. (Patient not taking: Reported on 11/18/2022)     loratadine (CLARITIN) 5 MG chewable tablet Chew 1 tablet (5 mg total) by mouth daily. (Patient not taking: Reported on 11/18/2022) 30 tablet 1   No current facility-administered medications for this visit.   Physical Exam: Blood pressure (!) 139/56, pulse 71, temperature 98.3 F (36.8 C), temperature source Tympanic, resp. rate 18, weight 146 lb (66.2 kg), SpO2 100 %. Physical Exam Constitutional:      General: She is not in acute distress.    Appearance: She is not diaphoretic.  HENT:     Head: Normocephalic and atraumatic.     Nose: Nose normal.     Mouth/Throat:     Pharynx: No oropharyngeal exudate.  Eyes:     General: No scleral icterus.    Pupils: Pupils are equal, round, and reactive to light.  Cardiovascular:     Rate and Rhythm: Normal rate.     Heart sounds: No murmur heard. Pulmonary:     Effort: Pulmonary  effort is normal. No respiratory distress.  Abdominal:     General: There is no distension.  Musculoskeletal:        General: Normal range of motion.     Cervical back: Normal range of motion and neck supple.  Skin:    General: Skin is warm and dry.     Findings: No erythema.  Neurological:     Mental Status: She is alert and oriented to person, place, and time. Mental status is at baseline.     Motor: No abnormal muscle tone.  Psychiatric:        Mood and Affect: Mood and affect normal.      Appointment on 11/18/2022  Component Date Value Ref Range Status   WBC 11/18/2022 7.8  4.0 - 10.5 K/uL Final   RBC 11/18/2022 4.03  3.87 - 5.11 MIL/uL Final   Hemoglobin 11/18/2022 12.3  12.0 - 15.0 g/dL Final   HCT 16/04/9603 36.8  36.0 - 46.0 % Final   MCV 11/18/2022 91.3  80.0 - 100.0 fL Final   MCH  11/18/2022 30.5  26.0 - 34.0 pg Final   MCHC 11/18/2022 33.4  30.0 - 36.0 g/dL Final   RDW 09/81/1914 12.4  11.5 - 15.5 % Final   Platelets 11/18/2022 171  150 - 400 K/uL Final   nRBC 11/18/2022 0.0  0.0 - 0.2 % Final   Neutrophils Relative % 11/18/2022 65  % Final   Neutro Abs 11/18/2022 5.1  1.7 - 7.7 K/uL Final   Lymphocytes Relative 11/18/2022 19  % Final   Lymphs Abs 11/18/2022 1.5  0.7 - 4.0 K/uL Final   Monocytes Relative 11/18/2022 7  % Final   Monocytes Absolute 11/18/2022 0.5  0.1 - 1.0 K/uL Final   Eosinophils Relative 11/18/2022 7  % Final   Eosinophils Absolute 11/18/2022 0.6 (H)  0.0 - 0.5 K/uL Final   Basophils Relative 11/18/2022 1  % Final   Basophils Absolute 11/18/2022 0.1  0.0 - 0.1 K/uL Final   Immature Granulocytes 11/18/2022 1  % Final   Abs Immature Granulocytes 11/18/2022 0.07  0.00 - 0.07 K/uL Final   Performed at [facility], [address] [place], [place] [address]   Sodium 11/18/2022 137  135 - 145 mmol/L Final   Potassium 11/18/2022 4.4  3.5 - 5.1 mmol/L Final   Chloride 11/18/2022 102  98 - 111 mmol/L Final   CO2 11/18/2022 27  22 - 32 mmol/L  Final   Glucose, Bld 11/18/2022 92  70 - 99 mg/dL Final   Glucose reference range applies only to samples taken after fasting for at least 8 hours.   BUN 11/18/2022 29 (H)  8 - 23 mg/dL Final   Creatinine, Ser 11/18/2022 1.80 (H)  0.44 - 1.00 mg/dL Final   Calcium 62/13/0865 8.8 (L)  8.9 - 10.3 mg/dL Final   Total Protein 78/46/9629 6.5  6.5 - 8.1 g/dL Final   Albumin 52/84/1324 4.1  3.5 - 5.0 g/dL Final   AST 40/04/2724 18  15 - 41 U/L Final   ALT 11/18/2022 14  0 - 44 U/L Final   Alkaline Phosphatase 11/18/2022 55  38 - 126 U/L Final   Total Bilirubin 11/18/2022 0.8  0.3 - 1.2 mg/dL Final   GFR, Estimated 11/18/2022 28 (L)  >60 mL/min Final   Comment: (NOTE) Calculated using the CKD-EPI Creatinine Equation (2021)    Anion gap 11/18/2022 8  5 - 15 Final   Performed at [facility], [address] [place], [place] [address]   RADIOGRAPHIC STUDIES: I have personally reviewed the radiological images as listed and agreed with the findings in the report. No results found.

## 2022-11-18 NOTE — Assessment & Plan Note (Signed)
#  Osteoporosis, left femoral neck, discussed with patient about the recommendation of Prolia every 6 months.- Patient declined Prolia.  Recommend patient to continue calcium and vitamin D supplementation

## 2022-11-18 NOTE — Assessment & Plan Note (Signed)
#   History of breast cancer- 2014 Patient has been off letrozole. Clinically she is doing well.  Recommend  annual screening mammogram in November 2024 She elects to follow up with PCP and she will ask PCP to order annual screening mammogram.

## 2022-11-18 NOTE — Assessment & Plan Note (Signed)
Encourage oral hydration and avoid nephrotoxins.   

## 2022-11-18 NOTE — Assessment & Plan Note (Addendum)
#  Hypoglobulinemia Status post IVIG 25 g x 1 in the past  Patient declined additional IVIG infusion at this point.

## 2022-11-22 LAB — IMMUNOGLOBULINS A/E/G/M, SERUM
IgA: 113 mg/dL (ref 64–422)
IgE (Immunoglobulin E), Serum: 282 IU/mL (ref 6–495)
IgG (Immunoglobin G), Serum: 589 mg/dL (ref 586–1602)
IgM (Immunoglobulin M), Srm: 37 mg/dL (ref 26–217)

## 2022-12-02 ENCOUNTER — Ambulatory Visit: Payer: Medicare PPO | Attending: Internal Medicine

## 2022-12-02 DIAGNOSIS — I739 Peripheral vascular disease, unspecified: Secondary | ICD-10-CM | POA: Diagnosis not present

## 2022-12-03 LAB — VAS US ABI WITH/WO TBI
Left ABI: 1.69
Right ABI: 1.69

## 2022-12-05 ENCOUNTER — Other Ambulatory Visit: Payer: Self-pay | Admitting: Nurse Practitioner

## 2022-12-06 DIAGNOSIS — E1169 Type 2 diabetes mellitus with other specified complication: Secondary | ICD-10-CM | POA: Diagnosis not present

## 2022-12-06 DIAGNOSIS — N1832 Chronic kidney disease, stage 3b: Secondary | ICD-10-CM | POA: Diagnosis not present

## 2022-12-06 DIAGNOSIS — E1159 Type 2 diabetes mellitus with other circulatory complications: Secondary | ICD-10-CM | POA: Diagnosis not present

## 2022-12-06 DIAGNOSIS — I10 Essential (primary) hypertension: Secondary | ICD-10-CM | POA: Diagnosis not present

## 2022-12-06 DIAGNOSIS — E1122 Type 2 diabetes mellitus with diabetic chronic kidney disease: Secondary | ICD-10-CM | POA: Diagnosis not present

## 2022-12-06 DIAGNOSIS — E785 Hyperlipidemia, unspecified: Secondary | ICD-10-CM | POA: Diagnosis not present

## 2022-12-08 ENCOUNTER — Encounter: Payer: Self-pay | Admitting: Family Medicine

## 2022-12-08 ENCOUNTER — Ambulatory Visit: Payer: Medicare PPO | Admitting: Family Medicine

## 2022-12-08 VITALS — BP 124/70 | HR 71 | Temp 97.9°F | Ht 62.5 in | Wt 148.2 lb

## 2022-12-08 DIAGNOSIS — Z794 Long term (current) use of insulin: Secondary | ICD-10-CM

## 2022-12-08 DIAGNOSIS — E1169 Type 2 diabetes mellitus with other specified complication: Secondary | ICD-10-CM | POA: Diagnosis not present

## 2022-12-08 DIAGNOSIS — E785 Hyperlipidemia, unspecified: Secondary | ICD-10-CM | POA: Diagnosis not present

## 2022-12-08 DIAGNOSIS — I739 Peripheral vascular disease, unspecified: Secondary | ICD-10-CM

## 2022-12-08 DIAGNOSIS — E1122 Type 2 diabetes mellitus with diabetic chronic kidney disease: Secondary | ICD-10-CM

## 2022-12-08 DIAGNOSIS — I10 Essential (primary) hypertension: Secondary | ICD-10-CM | POA: Diagnosis not present

## 2022-12-08 DIAGNOSIS — N1832 Chronic kidney disease, stage 3b: Secondary | ICD-10-CM

## 2022-12-08 MED ORDER — LANTUS SOLOSTAR 100 UNIT/ML ~~LOC~~ SOPN: 35.0000 [IU] | PEN_INJECTOR | Freq: Every day | SUBCUTANEOUS | 0 refills | Status: DC

## 2022-12-08 NOTE — Assessment & Plan Note (Signed)
Chronic issue.  Patient will continue Repatha 140 mg every 14 days and Zetia 10 mg daily.  Recent lipid panel well-controlled.

## 2022-12-08 NOTE — Assessment & Plan Note (Signed)
Chronic issue.  Adequately controlled.  Patient will continue losartan 50 mg twice daily and carvedilol 12.5 mg twice daily.

## 2022-12-08 NOTE — Progress Notes (Signed)
Kelsey Alar, MD Phone: [phone]  Kelsey Mason is a 82 y.o. female who presents today for f/u.  HYPERTENSION Disease Monitoring: Blood pressure range-130/60 though only checking once weekly Chest pain- no      Dyspnea- no Medications: Compliance- taking coreg, losartan   Edema- no  DIABETES Disease Monitoring: Blood Sugar ranges-80s, A1c on Monday was 5.6 Polyuria/phagia/dipsia- no      Optho- UTD Medications: Compliance- taking farxiga, trulicity, lantus, notes her insurance paid for farxiga and jardiance recently so she will take the jardiance once she finishes the farxiga prescription and then she will go back to farxiga Hypoglycemic symptoms- notes one time in the middle of the night dropped to 43  HYPERLIPIDEMIA Disease Monitoring: See symptoms for Hypertension Medications: Compliance- taking repatha, zetia Right upper quadrant pain- no  Muscle aches- no    Social History   Tobacco Use  Smoking Status Former   Packs/day: 0.25   Years: 1.00   Additional pack years: 0.00   Total pack years: 0.25   Types: Cigarettes   Quit date: 1963   Years since quitting: 61.4   Passive exposure: Never  Smokeless Tobacco Never    Current Outpatient Medications on File Prior to Visit  Medication Sig Dispense Refill   albuterol (VENTOLIN HFA) 108 (90 Base) MCG/ACT inhaler TAKE 2 PUFFS BY MOUTH EVERY 6 HOURS AS NEEDED FOR WHEEZE OR SHORTNESS OF BREATH 18 each 11   aspirin 81 MG tablet Take 81 mg by mouth daily.     blood glucose meter kit and supplies KIT Dispense based on patient and insurance preference. Use up to four times daily as directed. One Touch Ultra mini meter (FOR ICD-9 250.00, 250.01). Dx: E11.9 1 each 0   carvedilol (COREG) 12.5 MG tablet TAKE 1 TABLET BY MOUTH TWICE A DAY 180 tablet 2   cholecalciferol (VITAMIN D) 25 MCG (1000 UT) tablet Take 1,000 Units by mouth daily.     dapagliflozin propanediol (FARXIGA) 10 MG TABS tablet Take 10 mg by mouth  daily.     Evolocumab (REPATHA SURECLICK) 140 MG/ML SOAJ INJECT 140 MG INTO THE SKIN EVERY 14 (FOURTEEN) DAYS. 2 mL 2   ezetimibe (ZETIA) 10 MG tablet Take 1 tablet (10 mg total) by mouth daily. 90 tablet 3   furosemide (LASIX) 20 MG tablet TAKE 1 TABLET (20 MG TOTAL) BY MOUTH DAILY AS NEEDED (FOR >2LB WEIGHT GAIN OVERNIGHT OR 5 LB WEIGHT GAIN IN 1 WEEK OR SWELLING.). 90 tablet 3   glucose blood (ONE TOUCH ULTRA TEST) test strip USE TO TEST BLOOD SUGAR UP TO 4 TIMES DAILY 100 each 6   HYDROcodone bit-homatropine (HYCODAN) 5-1.5 MG/5ML syrup Take 5 mLs by mouth every 8 (eight) hours as needed for cough. 120 mL 0   loratadine (CLARITIN) 5 MG chewable tablet Chew 1 tablet (5 mg total) by mouth daily. 30 tablet 1   losartan (COZAAR) 100 MG tablet TAKE 1/2 TABLET (50 MG TOTAL) BY MOUTH 2 (TWO) TIMES DAILY. 90 tablet 1   meclizine (ANTIVERT) 12.5 MG tablet Take 1 tablet (12.5 mg total) by mouth 3 (three) times daily as needed for dizziness. 30 tablet 0   nitroGLYCERIN (NITROSTAT) 0.4 MG SL tablet PLACE 1 TABLET (0.4 MG TOTAL) UNDER THE TONGUE EVERY 5 (FIVE) MINUTES AS NEEDED FOR CHEST PAIN. MAXIMUM OF 3 DOSES. 25 tablet 3   NOVOFINE PEN NEEDLE 32G X 6 MM MISC USE AS DIRECTED WITH LANTUS PEN 100 each 2   TRULICITY 1.5 MG/0.5ML SOPN Inject  1.5 mg as directed once a week.  11   vitamin B-12 (CYANOCOBALAMIN) 1000 MCG tablet Take 1,000 mcg by mouth daily.     No current facility-administered medications on file prior to visit.     ROS see history of present illness  Objective  Physical Exam Vitals:   12/08/22 0824 12/08/22 0849  BP: 130/84 124/70  Pulse: 71   Temp: 97.9 F (36.6 C)   SpO2: 98%     BP Readings from Last 3 Encounters:  12/08/22 124/70  11/18/22 (!) 139/56  11/03/22 (!) 148/72   Wt Readings from Last 3 Encounters:  12/08/22 148 lb 3.2 oz (67.2 kg)  11/18/22 146 lb (66.2 kg)  11/03/22 145 lb 12.8 oz (66.1 kg)    Physical Exam Constitutional:      General: She is not in  acute distress.    Appearance: She is not diaphoretic.  Cardiovascular:     Rate and Rhythm: Normal rate and regular rhythm.     Heart sounds: Normal heart sounds.  Pulmonary:     Effort: Pulmonary effort is normal.     Breath sounds: Normal breath sounds.  Skin:    General: Skin is warm and dry.  Neurological:     Mental Status: She is alert.    Diabetic Foot Exam - Simple   Simple Foot Form Diabetic Foot exam was performed with the following findings: Yes 12/08/2022  8:45 AM  Visual Inspection No deformities, no ulcerations, no other skin breakdown bilaterally: Yes Sensation Testing Intact to touch and monofilament testing bilaterally: Yes Pulse Check See comments: Yes Comments Difficult to palpate PT pulses, intact DP pulses bilaterally      Assessment/Plan: Please see individual problem list.  Essential hypertension Assessment & Plan: Chronic issue.  Adequately controlled.  Patient will continue losartan 50 mg twice daily and carvedilol 12.5 mg twice daily.   Type 2 diabetes mellitus with stage 3b chronic kidney disease, with long-term current use of insulin (HCC) Assessment & Plan: Chronic issue.  Extremely well-controlled.  Patient will continue Farxiga 10 mg daily, Lantus 35 units daily, and Trulicity 1.5 mg weekly.   Hyperlipidemia associated with type 2 diabetes mellitus (HCC) Assessment & Plan: Chronic issue.  Patient will continue Repatha 140 mg every 14 days and Zetia 10 mg daily.  Recent lipid panel well-controlled.   PAD (peripheral artery disease) (HCC) Assessment & Plan: Chronic issue.  Recent ABIs checked by cardiology.  She will continue risk factor management and continue to follow with cardiology.   Other orders -     Lantus SoloStar; Inject 35 Units into the skin daily.  Dispense: 15 mL; Refill: 0     Return in about 6 months (around 06/09/2023).   Kelsey Alar, MD [facility]

## 2022-12-08 NOTE — Assessment & Plan Note (Signed)
Chronic issue.  Recent ABIs checked by cardiology.  She will continue risk factor management and continue to follow with cardiology.

## 2022-12-08 NOTE — Assessment & Plan Note (Signed)
Chronic issue.  Extremely well-controlled.  Patient will continue Farxiga 10 mg daily, Lantus 35 units daily, and Trulicity 1.5 mg weekly.

## 2022-12-13 ENCOUNTER — Telehealth: Payer: Self-pay | Admitting: Internal Medicine

## 2022-12-13 MED ORDER — NITROGLYCERIN 0.4 MG SL SUBL: 0.4000 mg | SUBLINGUAL_TABLET | SUBLINGUAL | 3 refills | Status: AC | PRN

## 2022-12-13 NOTE — Telephone Encounter (Signed)
*  STAT* If patient is at the pharmacy, call can be transferred to refill team.   1. Which medications need to be refilled? (please list name of each medication and dose if known) nitroGLYCERIN (NITROSTAT) 0.4 MG SL tablet   2. Which pharmacy/location (including street and city if local pharmacy) is medication to be sent to? [id number] - [place], [place] - [address]   3. Do they need a 30 day or 90 day supply? 90

## 2022-12-13 NOTE — Telephone Encounter (Signed)
Requested Prescriptions   Signed Prescriptions Disp Refills   nitroGLYCERIN (NITROSTAT) 0.4 MG SL tablet 25 tablet 3    Sig: Place 1 tablet (0.4 mg total) under the tongue every 5 (five) minutes as needed for chest pain. Maximum of 3 doses.    Authorizing Provider: END, CHRISTOPHER    Ordering User: Guerry Minors

## 2023-01-05 DIAGNOSIS — I10 Essential (primary) hypertension: Secondary | ICD-10-CM | POA: Diagnosis not present

## 2023-01-05 DIAGNOSIS — R600 Localized edema: Secondary | ICD-10-CM | POA: Diagnosis not present

## 2023-01-05 DIAGNOSIS — E1129 Type 2 diabetes mellitus with other diabetic kidney complication: Secondary | ICD-10-CM | POA: Diagnosis not present

## 2023-01-05 DIAGNOSIS — N1832 Chronic kidney disease, stage 3b: Secondary | ICD-10-CM | POA: Diagnosis not present

## 2023-01-12 DIAGNOSIS — J019 Acute sinusitis, unspecified: Secondary | ICD-10-CM | POA: Diagnosis not present

## 2023-01-12 DIAGNOSIS — R112 Nausea with vomiting, unspecified: Secondary | ICD-10-CM | POA: Diagnosis not present

## 2023-01-12 DIAGNOSIS — B9689 Other specified bacterial agents as the cause of diseases classified elsewhere: Secondary | ICD-10-CM | POA: Diagnosis not present

## 2023-01-12 DIAGNOSIS — Z03818 Encounter for observation for suspected exposure to other biological agents ruled out: Secondary | ICD-10-CM | POA: Diagnosis not present

## 2023-01-12 DIAGNOSIS — H66003 Acute suppurative otitis media without spontaneous rupture of ear drum, bilateral: Secondary | ICD-10-CM | POA: Diagnosis not present

## 2023-01-12 DIAGNOSIS — H8309 Labyrinthitis, unspecified ear: Secondary | ICD-10-CM | POA: Diagnosis not present

## 2023-01-18 ENCOUNTER — Emergency Department: Payer: Medicare PPO

## 2023-01-18 ENCOUNTER — Encounter: Payer: Self-pay | Admitting: Emergency Medicine

## 2023-01-18 ENCOUNTER — Other Ambulatory Visit: Payer: Self-pay

## 2023-01-18 ENCOUNTER — Emergency Department
Admission: EM | Admit: 2023-01-18 | Discharge: 2023-01-18 | Disposition: A | Discharge status: Home / Self Care | Payer: Medicare PPO | Attending: Emergency Medicine | Admitting: Emergency Medicine

## 2023-01-18 DIAGNOSIS — I130 Hypertensive heart and chronic kidney disease with heart failure and stage 1 through stage 4 chronic kidney disease, or unspecified chronic kidney disease: Secondary | ICD-10-CM | POA: Diagnosis not present

## 2023-01-18 DIAGNOSIS — R42 Dizziness and giddiness: Secondary | ICD-10-CM | POA: Diagnosis not present

## 2023-01-18 DIAGNOSIS — H81399 Other peripheral vertigo, unspecified ear: Secondary | ICD-10-CM | POA: Diagnosis not present

## 2023-01-18 DIAGNOSIS — I10 Essential (primary) hypertension: Secondary | ICD-10-CM | POA: Diagnosis not present

## 2023-01-18 DIAGNOSIS — I509 Heart failure, unspecified: Secondary | ICD-10-CM | POA: Diagnosis not present

## 2023-01-18 DIAGNOSIS — N189 Chronic kidney disease, unspecified: Secondary | ICD-10-CM | POA: Diagnosis not present

## 2023-01-18 DIAGNOSIS — I739 Peripheral vascular disease, unspecified: Secondary | ICD-10-CM | POA: Diagnosis not present

## 2023-01-18 LAB — BASIC METABOLIC PANEL
Anion gap: 9 (ref 5–15)
BUN: 34 mg/dL — ABNORMAL HIGH (ref 8–23)
CO2: 23 mmol/L (ref 22–32)
Calcium: 9.4 mg/dL (ref 8.9–10.3)
Chloride: 101 mmol/L (ref 98–111)
Creatinine, Ser: 1.41 mg/dL — ABNORMAL HIGH (ref 0.44–1.00)
GFR, Estimated: 37 mL/min — ABNORMAL LOW (ref >60)
Glucose, Bld: 96 mg/dL (ref 70–99)
Potassium: 3.9 mmol/L (ref 3.5–5.1)
Sodium: 133 mmol/L — ABNORMAL LOW (ref 135–145)

## 2023-01-18 LAB — CBC WITH DIFFERENTIAL/PLATELET
Abs Immature Granulocytes: 0.41 10*3/uL — ABNORMAL HIGH (ref 0.00–0.07)
Basophils Absolute: 0.1 10*3/uL (ref 0.0–0.1)
Basophils Relative: 0 %
Eosinophils Absolute: 0.2 10*3/uL (ref 0.0–0.5)
Eosinophils Relative: 1 %
HCT: 43.6 % (ref 36.0–46.0)
Hemoglobin: 14.8 g/dL (ref 12.0–15.0)
Immature Granulocytes: 2 %
Lymphocytes Relative: 22 %
Lymphs Abs: 3.9 10*3/uL (ref 0.7–4.0)
MCH: 30.5 pg (ref 26.0–34.0)
MCHC: 33.9 g/dL (ref 30.0–36.0)
MCV: 89.9 fL (ref 80.0–100.0)
Monocytes Absolute: 1 10*3/uL (ref 0.1–1.0)
Monocytes Relative: 6 %
Neutro Abs: 11.8 10*3/uL — ABNORMAL HIGH (ref 1.7–7.7)
Neutrophils Relative %: 69 %
Platelets: 270 10*3/uL (ref 150–400)
RBC: 4.85 MIL/uL (ref 3.87–5.11)
RDW: 12.1 % (ref 11.5–15.5)
WBC: 17.3 10*3/uL — ABNORMAL HIGH (ref 4.0–10.5)
nRBC: 0 % (ref 0.0–0.2)

## 2023-01-18 MED ORDER — MECLIZINE HCL 25 MG PO TABS
25.0000 mg | ORAL_TABLET | Freq: Once | ORAL | Status: AC
  Administered 2023-01-18: 25 mg via ORAL
  Filled 2023-01-18: qty 1

## 2023-01-18 MED ORDER — MECLIZINE HCL 25 MG PO TABS: 25.0000 mg | ORAL_TABLET | Freq: Three times a day (TID) | ORAL | 0 refills | Status: DC | PRN

## 2023-01-18 NOTE — ED Notes (Signed)
Pt reports dizziness since this am.  No n/v/  no h/a   no slurred speech.  Taking meclizine without relief.  Denies chest pain or sob.  Pt reports dizziness worse when turning head to the left side.  Pt alert  speech clear.

## 2023-01-18 NOTE — ED Triage Notes (Addendum)
Patient to ED via POV for dizziness since last PM. Patient states dizziness started when she turned on her side yesterday. Hx of vertigo. Pt reports it feels similar to her last episode of vertigo. Patient states she is unsure if she is taking her vertigo medication correctly.

## 2023-01-18 NOTE — ED Provider Notes (Signed)
[facility] Provider Note    Event Date/Time   First MD Initiated Contact with Patient 01/18/23 1553     (approximate)   History   Chief Complaint Dizziness   HPI  Kelsey Mason is a 82 y.o. female with past medical history of hypertension, diabetes, CKD, CHF, and PAD who presents to the ED complaining of dizziness.  Patient reports that for the past 2 days whenever she turns her head to the left she begins to feel dizzy.  She states it feels like she is spinning to the side and she denies any lightheadedness or feeling like she is going to pass out.  She denies any vision changes, speech changes, numbness, or weakness.  She was seen at the walk-in clinic yesterday for the symptoms and prescribed meclizine, prednisone, doxycycline, and Zofran.  She states she has been taking these as prescribed with no significant relief, last dose of meclizine was earlier this morning.     Physical Exam   Triage Vital Signs: ED Triage Vitals  Encounter Vitals Group     BP 01/18/23 1530 (!) 159/79     Systolic BP Percentile --      Diastolic BP Percentile --      Pulse Rate 01/18/23 1530 73     Resp 01/18/23 1530 18     Temp 01/18/23 1530 98.3 F (36.8 C)     Temp Source 01/18/23 1530 Oral     SpO2 01/18/23 1530 100 %     Weight 01/18/23 1531 144 lb (65.3 kg)     Height 01/18/23 1531 5\' 2"  (1.575 m)     Head Circumference --      Peak Flow --      Pain Score 01/18/23 1531 0     Pain Loc --      Pain Education --      Exclude from Growth Chart --     Most recent vital signs: Vitals:   01/18/23 1610 01/18/23 1800  BP: (!) 171/76 (!) 167/66  Pulse: 70 65  Resp: 11 12  Temp:    SpO2: 100% 100%    Constitutional: Alert and oriented. Eyes: Conjunctivae are normal. Head: Atraumatic. Nose: No congestion/rhinnorhea. Mouth/Throat: Mucous membranes are moist.  Cardiovascular: Normal rate, regular rhythm. Grossly normal heart sounds.  2+ radial pulses  bilaterally. Respiratory: Normal respiratory effort.  No retractions. Lungs CTAB. Gastrointestinal: Soft and nontender. No distention. Musculoskeletal: No lower extremity tenderness nor edema.  Neurologic:  Normal speech and language. No gross focal neurologic deficits are appreciated.    ED Results / Procedures / Treatments   Labs (all labs ordered are listed, but only abnormal results are displayed) Labs Reviewed  CBC WITH DIFFERENTIAL/PLATELET - Abnormal; Notable for the following components:      Result Value   WBC 17.3 (*)    Neutro Abs 11.8 (*)    Abs Immature Granulocytes 0.41 (*)    All other components within normal limits  BASIC METABOLIC PANEL - Abnormal; Notable for the following components:   Sodium 133 (*)    BUN 34 (*)    Creatinine, Ser 1.41 (*)    GFR, Estimated 37 (*)    All other components within normal limits     EKG  ED ECG REPORT I, Chesley Noon, the attending physician, personally viewed and interpreted this ECG.   Date: 01/18/2023  EKG Time: 16:05  Rate: 67  Rhythm: normal sinus rhythm  Axis: LAD  Intervals:left bundle branch  block  ST&T Change: None  RADIOLOGY CT head reviewed and interpreted by me with no hemorrhage or midline shift.  PROCEDURES:  Critical Care performed: No  Procedures   MEDICATIONS ORDERED IN ED: Medications  meclizine (ANTIVERT) tablet 25 mg (25 mg Oral Given 01/18/23 1637)     IMPRESSION / MDM / ASSESSMENT AND PLAN / ED COURSE  I reviewed the triage vital signs and the nursing notes.                              82 y.o. female with past medical history of hypertension, diabetes, CKD, CHF, and PAD who presents to the ED with dizziness and feeling like things are spinning when she turns to the left over the past 2 days.  Patient's presentation is most consistent with acute presentation with potential threat to life or bodily function.  Differential diagnosis includes, but is not limited to, peripheral  vertigo, central vertigo, AKI, electrolyte abnormality, anemia.  Patient nontoxic-appearing and in no acute distress, vital signs are remarkable for hypertension but otherwise reassuring.  EKG shows no evidence of arrhythmia or ischemia and I doubt cardiac etiology for her symptoms.  Labs show leukocytosis but I suspect this is due to her recently prescribed corticosteroid.  No significant anemia or electrolyte abnormality, renal function stable compared to previous.  Symptoms seem very likely to be due to peripheral vertigo and we will give follow-up dose of meclizine, but given her advanced age and no relief with meclizine earlier, will also check CT head.  CT head is negative for acute process, on reassessment patient reports feeling better following dose of meclizine.  She is appropriate for discharge home with PCP or ENT follow-up.  She was counseled to return to the ED for new or worsening symptoms, patient and son agree with plan.      FINAL CLINICAL IMPRESSION(S) / ED DIAGNOSES   Final diagnoses:  Peripheral vertigo, unspecified laterality     Rx / DC Orders   ED Discharge Orders          Ordered    meclizine (ANTIVERT) 25 MG tablet  3 times daily PRN        01/18/23 1902             Note:  This document was prepared using Dragon voice recognition software and may include unintentional dictation errors.   Chesley Noon, MD 01/18/23 Ebony Cargo

## 2023-01-18 NOTE — ED Notes (Signed)
Pt to ct scan   pt alert  meds given.

## 2023-01-19 ENCOUNTER — Emergency Department
Admission: EM | Admit: 2023-01-19 | Discharge: 2023-01-19 | Disposition: A | Discharge status: Home / Self Care | Payer: Medicare PPO | Attending: Emergency Medicine | Admitting: Emergency Medicine

## 2023-01-19 ENCOUNTER — Other Ambulatory Visit: Payer: Self-pay

## 2023-01-19 ENCOUNTER — Emergency Department: Payer: Medicare PPO

## 2023-01-19 DIAGNOSIS — S0101XA Laceration without foreign body of scalp, initial encounter: Secondary | ICD-10-CM | POA: Diagnosis not present

## 2023-01-19 DIAGNOSIS — W19XXXA Unspecified fall, initial encounter: Secondary | ICD-10-CM | POA: Diagnosis not present

## 2023-01-19 DIAGNOSIS — S0191XA Laceration without foreign body of unspecified part of head, initial encounter: Secondary | ICD-10-CM | POA: Diagnosis not present

## 2023-01-19 DIAGNOSIS — R55 Syncope and collapse: Secondary | ICD-10-CM | POA: Diagnosis not present

## 2023-01-19 DIAGNOSIS — W01198A Fall on same level from slipping, tripping and stumbling with subsequent striking against other object, initial encounter: Secondary | ICD-10-CM | POA: Insufficient documentation

## 2023-01-19 DIAGNOSIS — Z23 Encounter for immunization: Secondary | ICD-10-CM | POA: Insufficient documentation

## 2023-01-19 DIAGNOSIS — R42 Dizziness and giddiness: Secondary | ICD-10-CM | POA: Diagnosis not present

## 2023-01-19 DIAGNOSIS — I6782 Cerebral ischemia: Secondary | ICD-10-CM | POA: Diagnosis not present

## 2023-01-19 DIAGNOSIS — I951 Orthostatic hypotension: Secondary | ICD-10-CM | POA: Diagnosis not present

## 2023-01-19 DIAGNOSIS — S199XXA Unspecified injury of neck, initial encounter: Secondary | ICD-10-CM | POA: Diagnosis not present

## 2023-01-19 DIAGNOSIS — Y92002 Bathroom of unspecified non-institutional (private) residence single-family (private) house as the place of occurrence of the external cause: Secondary | ICD-10-CM | POA: Insufficient documentation

## 2023-01-19 DIAGNOSIS — R29818 Other symptoms and signs involving the nervous system: Secondary | ICD-10-CM | POA: Diagnosis not present

## 2023-01-19 DIAGNOSIS — S0990XA Unspecified injury of head, initial encounter: Secondary | ICD-10-CM | POA: Diagnosis not present

## 2023-01-19 MED ORDER — TETANUS-DIPHTH-ACELL PERTUSSIS 5-2.5-18.5 LF-MCG/0.5 IM SUSY
0.5000 mL | PREFILLED_SYRINGE | Freq: Once | INTRAMUSCULAR | Status: AC
  Administered 2023-01-19: 0.5 mL via INTRAMUSCULAR
  Filled 2023-01-19: qty 0.5

## 2023-01-19 MED ORDER — LIDOCAINE HCL (PF) 1 % IJ SOLN
5.0000 mL | Freq: Once | INTRAMUSCULAR | Status: DC
  Filled 2023-01-19: qty 5

## 2023-01-19 NOTE — ED Provider Notes (Signed)
[facility] Provider Note   Event Date/Time   First MD Initiated Contact with Patient 01/19/23 0745     (approximate) History  Loss of Consciousness  HPI Kelsey Mason is a 82 y.o. female with a stated past medical history of vertigo who presents complaining of a syncopal episode this morning.  Patient states that she got up from bed in order to try to go to the bathroom and started feeling lightheaded and vertiginous.  Patient states when she sat down on the toilet she began feeling flushed and when she attempted to get up she became extremely lightheaded and fell backwards hitting the back of her head on the sink suffering a laceration to the occipital scalp.  Patient does endorse brief loss of consciousness. ROS: Patient currently denies any vision changes, tinnitus, difficulty speaking, facial droop, sore throat, chest pain, shortness of breath, abdominal pain, nausea/vomiting/diarrhea, dysuria, or weakness/numbness/paresthesias in any extremity   Physical Exam  Triage Vital Signs: ED Triage Vitals  Encounter Vitals Group     BP 01/19/23 0748 (!) 147/68     Systolic BP Percentile --      Diastolic BP Percentile --      Pulse Rate 01/19/23 0748 80     Resp 01/19/23 0748 18     Temp 01/19/23 0759 98.1 F (36.7 C)     Temp Source 01/19/23 0759 Oral     SpO2 01/19/23 0748 97 %     Weight 01/19/23 0747 144 lb 9.6 oz (65.6 kg)     Height 01/19/23 0747 5\' 8"  (1.727 m)     Head Circumference --      Peak Flow --      Pain Score 01/19/23 0747 8     Pain Loc --      Pain Education --      Exclude from Growth Chart --    Most recent vital signs: Vitals:   01/19/23 1031 01/19/23 1200  BP: (!) 117/59 (!) 151/71  Pulse: 84 75  Resp: 17 16  Temp:    SpO2: 99% 98%   General: Awake, oriented x4. CV:  Good peripheral perfusion.  Resp:  Normal effort.  Abd:  No distention.  Other:  Elderly overweight Caucasian female laying in bed in no acute  distress.  1.5 cm superficial laceration to the occipital scalp ED Results / Procedures / Treatments  Labs (all labs ordered are listed, but only abnormal results are displayed) Labs Reviewed - No data to display EKG ED ECG REPORT I, Merwyn Katos, the attending physician, personally viewed and interpreted this ECG. Date: 01/19/2023 EKG Time: 0755 Rate: 79 Rhythm: normal sinus rhythm QRS Axis: normal Intervals: normal ST/T Wave abnormalities: normal Narrative Interpretation: no evidence of acute ischemia RADIOLOGY ED MD interpretation: CT of the head without contrast interpreted by me shows no evidence of acute abnormalities including no intracerebral hemorrhage, obvious masses, or significant edema  MRI brain interpreted independently by me and shows no evidence of acute abnormalities -Agree with radiology assessment Official radiology report(s): MR Brain Wo Contrast (neuro protocol)  Result Date: 01/19/2023 CLINICAL DATA:  Neuro deficit, acute, stroke suspected EXAM: MRI HEAD WITHOUT CONTRAST TECHNIQUE: Multiplanar, multiecho pulse sequences of the brain and surrounding structures were obtained without intravenous contrast. COMPARISON:  Same day CT head.  Brain MRI 05/28/2022 FINDINGS: Brain: Negative for an acute infarct. No hemorrhage. No hydrocephalus. No extra-axial fluid collection. Is sequela of mild chronic microvascular ischemic change. No mass effect. No  mass lesion. Vascular: Normal flow voids. Skull and upper cervical spine: Normal marrow signal. Sinuses/Orbits: No middle ear or mastoid effusion. Paranasal sinuses are notable for mild mucosal thickening in the bilateral maxillary sinuses. Bilateral lens replacement. Orbits are otherwise unremarkable. Other: None. IMPRESSION: No acute intracranial process. Electronically Signed   By: Lorenza Cambridge M.D.   On: 01/19/2023 10:13   CT Head Wo Contrast  Result Date: 01/19/2023 CLINICAL DATA:  Head trauma, hit head on floor,  laceration to back of head EXAM: CT HEAD WITHOUT CONTRAST CT CERVICAL SPINE WITHOUT CONTRAST TECHNIQUE: Multidetector CT imaging of the head and cervical spine was performed following the standard protocol without intravenous contrast. Multiplanar CT image reconstructions of the cervical spine were also generated. RADIATION DOSE REDUCTION: This exam was performed according to the departmental dose-optimization program which includes automated exposure control, adjustment of the mA and/or kV according to patient size and/or use of iterative reconstruction technique. COMPARISON:  01/18/2023 FINDINGS: CT HEAD FINDINGS Brain: No evidence of acute infarction, hemorrhage, hydrocephalus, extra-axial collection or mass lesion/mass effect. Vascular: No hyperdense vessel or unexpected calcification. Skull: Normal. Negative for fracture or focal lesion. Sinuses/Orbits: No acute finding. Other: Soft tissue laceration of the scalp vertex. CT CERVICAL SPINE FINDINGS Alignment: Degenerative straightening of the normal cervical lordosis. Skull base and vertebrae: No acute fracture. No primary bone lesion or focal pathologic process. Soft tissues and spinal canal: No prevertebral fluid or swelling. No visible canal hematoma. Disc levels: Mild to moderate disc space height loss and osteophytosis of the lower cervical levels. Upper chest: Negative. Other: None. IMPRESSION: 1. No acute intracranial pathology. 2. Soft tissue laceration of the scalp vertex. 3. No fracture or static subluxation of the cervical spine. 4. Mild to moderate disc space height loss and osteophytosis of the lower cervical levels. Electronically Signed   By: Jearld Lesch M.D.   On: 01/19/2023 08:50   CT Cervical Spine Wo Contrast  Result Date: 01/19/2023 CLINICAL DATA:  Head trauma, hit head on floor, laceration to back of head EXAM: CT HEAD WITHOUT CONTRAST CT CERVICAL SPINE WITHOUT CONTRAST TECHNIQUE: Multidetector CT imaging of the head and cervical spine  was performed following the standard protocol without intravenous contrast. Multiplanar CT image reconstructions of the cervical spine were also generated. RADIATION DOSE REDUCTION: This exam was performed according to the departmental dose-optimization program which includes automated exposure control, adjustment of the mA and/or kV according to patient size and/or use of iterative reconstruction technique. COMPARISON:  01/18/2023 FINDINGS: CT HEAD FINDINGS Brain: No evidence of acute infarction, hemorrhage, hydrocephalus, extra-axial collection or mass lesion/mass effect. Vascular: No hyperdense vessel or unexpected calcification. Skull: Normal. Negative for fracture or focal lesion. Sinuses/Orbits: No acute finding. Other: Soft tissue laceration of the scalp vertex. CT CERVICAL SPINE FINDINGS Alignment: Degenerative straightening of the normal cervical lordosis. Skull base and vertebrae: No acute fracture. No primary bone lesion or focal pathologic process. Soft tissues and spinal canal: No prevertebral fluid or swelling. No visible canal hematoma. Disc levels: Mild to moderate disc space height loss and osteophytosis of the lower cervical levels. Upper chest: Negative. Other: None. IMPRESSION: 1. No acute intracranial pathology. 2. Soft tissue laceration of the scalp vertex. 3. No fracture or static subluxation of the cervical spine. 4. Mild to moderate disc space height loss and osteophytosis of the lower cervical levels. Electronically Signed   By: Jearld Lesch M.D.   On: 01/19/2023 08:50   CT Head Wo Contrast  Result Date: 01/18/2023 CLINICAL DATA:  Vertigo, central EXAM: CT HEAD WITHOUT CONTRAST TECHNIQUE: Contiguous axial images were obtained from the base of the skull through the vertex without intravenous contrast. RADIATION DOSE REDUCTION: This exam was performed according to the departmental dose-optimization program which includes automated exposure control, adjustment of the mA and/or kV  according to patient size and/or use of iterative reconstruction technique. COMPARISON:  MRI head May 28, 2022. FINDINGS: Brain: No evidence of acute infarction, hemorrhage, hydrocephalus, extra-axial collection or mass lesion/mass effect. Vascular: No hyperdense vessel. Skull: No acute fracture. Sinuses/Orbits: Clear sinuses.  No acute orbital findings. Other: No mastoid effusions. IMPRESSION: No evidence of acute intracranial abnormality. Electronically Signed   By: Feliberto Harts M.D.   On: 01/18/2023 17:10   PROCEDURES: Critical Care performed: No .1-3 Lead EKG Interpretation  Performed by: Merwyn Katos, MD Authorized by: Merwyn Katos, MD     Interpretation: normal     ECG rate:  71   ECG rate assessment: normal     Rhythm: sinus rhythm     Ectopy: none     Conduction: normal    MEDICATIONS ORDERED IN ED: Medications  Tdap (BOOSTRIX) injection 0.5 mL (0.5 mLs Intramuscular Given 01/19/23 0842)   IMPRESSION / MDM / ASSESSMENT AND PLAN / ED COURSE  I reviewed the triage vital signs and the nursing notes.                             The patient is on the cardiac monitor to evaluate for evidence of arrhythmia and/or significant heart rate changes. Patient's presentation is most consistent with acute presentation with potential threat to life or bodily function. Patient presents with complaints of syncope/presyncope ED Workup:  CBC, BMP, Troponin, BNP, ECG, CXR Differential diagnosis includes HF, ICH, seizure, stroke, HOCM, ACS, aortic dissection, malignant arrhythmia, or GI bleed. Findings: No evidence of acute laboratory abnormalities.  Troponin negative x1 EKG: No e/o STEMI. No evidence of Brugadas sign, delta wave, epsilon wave, significantly prolonged QTc, or malignant arrhythmia.  Disposition: Discharge. Patient is at baseline at this time. Return precautions expressed and understood in person. Advised follow up with primary care provider or clinic physician in next  24 hours.   FINAL CLINICAL IMPRESSION(S) / ED DIAGNOSES   Final diagnoses:  Orthostatic syncope  Laceration of scalp, initial encounter   Rx / DC Orders   ED Discharge Orders     None      Note:  This document was prepared using Dragon voice recognition software and may include unintentional dictation errors.   Merwyn Katos, MD 01/19/23 [phone]

## 2023-01-19 NOTE — Discharge Instructions (Addendum)
Please begin taking 6.25 mg twice a day of your carvedilol instead of 12.5.  Please continue this until follow-up with your cardiologist

## 2023-01-19 NOTE — ED Triage Notes (Signed)
Patient had syncopal episode this morning; hit head on floor; has laceration to back of head.

## 2023-01-19 NOTE — ED Notes (Signed)
Pt to MRI

## 2023-01-24 ENCOUNTER — Telehealth: Payer: Self-pay | Admitting: *Deleted

## 2023-01-24 NOTE — Transitions of Care (Post Inpatient/ED Visit) (Signed)
01/24/2023  Name: Kelsey Mason MRN: [id number] DOB: [birth year]  Today's TOC FU Call Status: Today's TOC FU Call Status:: Successful TOC FU Call Competed TOC FU Call Complete Date: 01/24/23  Transition Care Management Follow-up Telephone Call Date of Discharge: 01/19/23 Discharge Facility: [facility]) Type of Discharge: Emergency Department Reason for ED Visit: Other: (syncope episode with laceration of scalp) How have you been since you were released from the hospital?: Better Any questions or concerns?: No  Items Reviewed: Did you receive and understand the discharge instructions provided?: Yes Medications obtained,verified, and reconciled?: Yes (Medications Reviewed) Any new allergies since your discharge?: No Do you have support at home?: Yes People in Home: alone Name of Support/Comfort Primary Source: charles  Medications Reviewed Today: Medications Reviewed Today     Reviewed by Luella Cook, RN (Case Manager) on 01/24/23 at 1518  Med List Status: <None>   Medication Order Taking? Sig Documenting Provider Last Dose Status Informant  albuterol (VENTOLIN HFA) 108 (90 Base) MCG/ACT inhaler [id number] Yes TAKE 2 PUFFS BY MOUTH EVERY 6 HOURS AS NEEDED FOR WHEEZE OR SHORTNESS OF BREATH Sherlene Shams, MD Taking Active   aspirin 81 MG tablet [id number] Yes Take 81 mg by mouth daily. [provider] Taking Active Self  blood glucose meter kit and supplies KIT [id number] Yes Dispense based on patient and insurance preference. Use up to four times daily as directed. One Touch Ultra mini meter (FOR ICD-9 250.00, 250.01). Dx: E11.9 Shelia Media, MD Taking Active Self  carvedilol (COREG) 12.5 MG tablet [id number] Yes TAKE 1 TABLET BY MOUTH TWICE A DAY End, Christopher, MD Taking Active   cholecalciferol (VITAMIN D) 25 MCG (1000 UT) tablet [id number] Yes Take 1,000 Units by mouth daily. [provider] Taking Active    dapagliflozin propanediol (FARXIGA) 10 MG TABS tablet [id number] Yes Take 10 mg by mouth daily. [provider] Taking Active   Evolocumab [facility] SURECLICK) 140 MG/ML Ivory Broad [id number] Yes INJECT 140 MG INTO THE SKIN EVERY 14 (FOURTEEN) DAYS. Creig Hines, NP Taking Active   ezetimibe (ZETIA) 10 MG tablet [id number] Yes Take 1 tablet (10 mg total) by mouth daily. End, Cristal Deer, MD Taking Active   furosemide (LASIX) 20 MG tablet [id number] Yes TAKE 1 TABLET (20 MG TOTAL) BY MOUTH DAILY AS NEEDED (FOR >2LB WEIGHT GAIN OVERNIGHT OR 5 LB WEIGHT GAIN IN 1 WEEK OR SWELLING.). End, Cristal Deer, MD Taking Active   glucose blood (ONE TOUCH ULTRA TEST) test strip [id number] Yes USE TO TEST BLOOD SUGAR UP TO 4 TIMES DAILY Everlene Other G, DO Taking Active Self  HYDROcodone bit-homatropine (HYCODAN) 5-1.5 MG/5ML syrup [id number] Yes Take 5 mLs by mouth every 8 (eight) hours as needed for cough. Kara Dies, NP Taking Active   insulin glargine (LANTUS SOLOSTAR) 100 UNIT/ML Solostar Pen [id number] Yes Inject 35 Units into the skin daily. Glori Luis, MD Taking Active   loratadine (CLARITIN) 5 MG chewable tablet [id number] Yes Chew 1 tablet (5 mg total) by mouth daily. Glori Luis, MD Taking Active   losartan (COZAAR) 100 MG tablet [id number] Yes TAKE 1/2 TABLET (50 MG TOTAL) BY MOUTH 2 (TWO) TIMES DAILY. End, Cristal Deer, MD Taking Active   meclizine (ANTIVERT) 25 MG tablet [id number]  Take 1 tablet (25 mg total) by mouth 3 (three) times daily as needed for dizziness. Chesley Noon, MD  Active   nitroGLYCERIN (NITROSTAT) 0.4 MG SL tablet [id number] Yes Place 1 tablet (0.4 mg total) under the  tongue every 5 (five) minutes as needed for chest pain. Maximum of 3 doses. Yvonne Kendall, MD Taking Active   NOVOFINE PEN NEEDLE 32G X 6 MM MISC [id number] Yes USE AS DIRECTED WITH LANTUS PEN Glori Luis, MD Taking Active   TRULICITY 1.5 MG/0.5ML SOPN [id number] Yes Inject 1.5 mg as  directed once a week. [provider] Taking Active Self           Med Note (NEWCOMER Elmer Sow   Wed Apr 01, 2021  9:17 AM)    vitamin B-12 (CYANOCOBALAMIN) 1000 MCG tablet [id number] Yes Take 1,000 mcg by mouth daily. [provider] Taking Active             Home Care and Equipment/Supplies: Were Home Health Services Ordered?: NA Any new equipment or medical supplies ordered?: NA  Functional Questionnaire: Do you need assistance with bathing/showering or dressing?: No Do you need assistance with meal preparation?: No Do you need assistance with eating?: No Do you have difficulty maintaining continence: No Do you need assistance with getting out of bed/getting out of a chair/moving?: No Do you have difficulty managing or taking your medications?: No  Follow up appointments reviewed: PCP Follow-up appointment confirmed?: Yes Date of PCP follow-up appointment?: 01/25/23 Follow-up Provider: [facility] Follow-up appointment confirmed?: No Reason Specialist Follow-Up Not Confirmed: Patient has Specialist Provider Number and will Call for Appointment (Patient has Dr Jenne Campus number if her symptoms worsen) Do you need transportation to your follow-up appointment?: No Do you understand care options if your condition(s) worsen?: Yes-patient verbalized understanding  SDOH Interventions Today    Flowsheet Row Most Recent Value  SDOH Interventions   Food Insecurity Interventions Intervention Not Indicated  Housing Interventions Intervention Not Indicated  Transportation Interventions Intervention Not Indicated      Interventions Today    Flowsheet Row Most Recent Value  General Interventions   General Interventions Discussed/Reviewed General Interventions Discussed, General Interventions Reviewed, Doctor Visits  Doctor Visits Discussed/Reviewed Doctor Visits Reviewed, Doctor Visits Discussed  Pharmacy Interventions   Pharmacy  Dicussed/Reviewed Pharmacy Topics Discussed      TOC Interventions Today    Flowsheet Row Most Recent Value  TOC Interventions   TOC Interventions Discussed/Reviewed TOC Interventions Discussed, TOC Interventions Reviewed       Gean Maidens BSN RN [facility] [phone]

## 2023-01-25 ENCOUNTER — Encounter: Payer: Self-pay | Admitting: Family Medicine

## 2023-01-25 ENCOUNTER — Ambulatory Visit (INDEPENDENT_AMBULATORY_CARE_PROVIDER_SITE_OTHER): Payer: Medicare PPO | Admitting: Family Medicine

## 2023-01-25 VITALS — BP 124/70 | HR 76 | Temp 98.5°F | Ht 68.0 in | Wt 144.2 lb

## 2023-01-25 DIAGNOSIS — I10 Essential (primary) hypertension: Secondary | ICD-10-CM | POA: Diagnosis not present

## 2023-01-25 DIAGNOSIS — D72829 Elevated white blood cell count, unspecified: Secondary | ICD-10-CM | POA: Diagnosis not present

## 2023-01-25 DIAGNOSIS — S0101XA Laceration without foreign body of scalp, initial encounter: Secondary | ICD-10-CM

## 2023-01-25 DIAGNOSIS — S0101XD Laceration without foreign body of scalp, subsequent encounter: Secondary | ICD-10-CM

## 2023-01-25 DIAGNOSIS — J309 Allergic rhinitis, unspecified: Secondary | ICD-10-CM | POA: Diagnosis not present

## 2023-01-25 DIAGNOSIS — H8110 Benign paroxysmal vertigo, unspecified ear: Secondary | ICD-10-CM | POA: Diagnosis not present

## 2023-01-25 DIAGNOSIS — S0191XA Laceration without foreign body of unspecified part of head, initial encounter: Secondary | ICD-10-CM | POA: Insufficient documentation

## 2023-01-25 LAB — BASIC METABOLIC PANEL
BUN: 27 mg/dL — ABNORMAL HIGH (ref 6–23)
CO2: 23 mEq/L (ref 19–32)
Calcium: 9.2 mg/dL (ref 8.4–10.5)
Chloride: 105 mEq/L (ref 96–112)
Creatinine, Ser: 1.49 mg/dL — ABNORMAL HIGH (ref 0.40–1.20)
GFR: 32.66 mL/min — ABNORMAL LOW (ref >60.00)
Glucose, Bld: 121 mg/dL — ABNORMAL HIGH (ref 70–99)
Potassium: 4.9 mEq/L (ref 3.5–5.1)
Sodium: 135 mEq/L (ref 135–145)

## 2023-01-25 LAB — CBC WITH DIFFERENTIAL/PLATELET
Basophils Absolute: 0.1 10*3/uL (ref 0.0–0.1)
Basophils Relative: 0.7 % (ref 0.0–3.0)
Eosinophils Absolute: 0.7 10*3/uL (ref 0.0–0.7)
Eosinophils Relative: 6.3 % — ABNORMAL HIGH (ref 0.0–5.0)
HCT: 40 % (ref 36.0–46.0)
Hemoglobin: 12.9 g/dL (ref 12.0–15.0)
Lymphocytes Relative: 17.8 % (ref 12.0–46.0)
Lymphs Abs: 1.8 10*3/uL (ref 0.7–4.0)
MCHC: 32.4 g/dL (ref 30.0–36.0)
MCV: 93.2 fl (ref 78.0–100.0)
Monocytes Absolute: 0.6 10*3/uL (ref 0.1–1.0)
Monocytes Relative: 5.7 % (ref 3.0–12.0)
Neutro Abs: 7.2 10*3/uL (ref 1.4–7.7)
Neutrophils Relative %: 69.5 % (ref 43.0–77.0)
Platelets: 199 10*3/uL (ref 150.0–400.0)
RBC: 4.29 Mil/uL (ref 3.87–5.11)
RDW: 13 % (ref 11.5–15.5)
WBC: 10.4 10*3/uL (ref 4.0–10.5)

## 2023-01-25 NOTE — Assessment & Plan Note (Addendum)
Chronic issue.  I suspect the patient's current symptoms are likely more allergy related than sinusitis related.  She will trial Claritin over-the-counter.  She will let us know if her sinus symptoms worsen over the next week and then I could consider sending in antibiotics if appropriate.

## 2023-01-25 NOTE — Assessment & Plan Note (Signed)
Healing well.  Advised to monitor for signs of infection and if those occur she will contact us immediately.

## 2023-01-25 NOTE — Assessment & Plan Note (Signed)
Chronic intermittent issue.  She can take meclizine as needed for this.  She will monitor for drowsiness.  Advised not to drive if she gets drowsy with this medication.

## 2023-01-25 NOTE — Progress Notes (Signed)
Kelsey Alar, MD Phone: [phone]  Kelsey Mason is a 82 y.o. female who presents today for f/u.  Fall/orthostatic hypotension: Patient was seen in the emergency department.  She notes she got up and went to the bathroom and was lightheaded.  When she got off the toilet she got even more lightheaded and blacked out.  She fell and hit her head.  She ended up in the emergency department related to this and had CT head, neck, and MRI head that were negative for acute issues.  She did have a laceration on the top of her scalp which she notes was glued.  She has adequate hydration.  She notes the ED had her cut her carvedilol in half and that has helped.  Notes her blood pressure most recently at home was 117/60.  She still has occasional lightheadedness though it is much better.  She is standing up slowly and waiting before she starts to walk.  She follows up with cardiology next month.  Vertigo: Patient has had to take some meclizine for this.  It does make her a little drowsy.  She does not drive when she takes this.  Sinus infection: Patient was treated with doxycycline.  She notes she took 7 days of this and then stopped it given she did not want to restart it with all the other stuff that has been going on.  Currently she has some mild frontal headache and some rhinorrhea.  She does not have much congestion.  She does have Claritin at home that she notes she can start.  Social History   Tobacco Use  Smoking Status Former   Current packs/day: 0.00   Average packs/day: 0.3 packs/day for 1 year (0.3 ttl pk-yrs)   Types: Cigarettes   Start date: 34   Quit date: 1963   Years since quitting: 61.6   Passive exposure: Never  Smokeless Tobacco Never    Current Outpatient Medications on File Prior to Visit  Medication Sig Dispense Refill   albuterol (VENTOLIN HFA) 108 (90 Base) MCG/ACT inhaler TAKE 2 PUFFS BY MOUTH EVERY 6 HOURS AS NEEDED FOR WHEEZE OR SHORTNESS OF BREATH 18 each  11   aspirin 81 MG tablet Take 81 mg by mouth daily.     blood glucose meter kit and supplies KIT Dispense based on patient and insurance preference. Use up to four times daily as directed. One Touch Ultra mini meter (FOR ICD-9 250.00, 250.01). Dx: E11.9 1 each 0   carvedilol (COREG) 12.5 MG tablet TAKE 1 TABLET BY MOUTH TWICE A DAY 180 tablet 2   cholecalciferol (VITAMIN D) 25 MCG (1000 UT) tablet Take 1,000 Units by mouth daily.     dapagliflozin propanediol (FARXIGA) 10 MG TABS tablet Take 10 mg by mouth daily.     Evolocumab (REPATHA SURECLICK) 140 MG/ML SOAJ INJECT 140 MG INTO THE SKIN EVERY 14 (FOURTEEN) DAYS. 2 mL 2   ezetimibe (ZETIA) 10 MG tablet Take 1 tablet (10 mg total) by mouth daily. 90 tablet 3   furosemide (LASIX) 20 MG tablet TAKE 1 TABLET (20 MG TOTAL) BY MOUTH DAILY AS NEEDED (FOR >2LB WEIGHT GAIN OVERNIGHT OR 5 LB WEIGHT GAIN IN 1 WEEK OR SWELLING.). 90 tablet 3   glucose blood (ONE TOUCH ULTRA TEST) test strip USE TO TEST BLOOD SUGAR UP TO 4 TIMES DAILY 100 each 6   HYDROcodone bit-homatropine (HYCODAN) 5-1.5 MG/5ML syrup Take 5 mLs by mouth every 8 (eight) hours as needed for cough. 120 mL 0  insulin glargine (LANTUS SOLOSTAR) 100 UNIT/ML Solostar Pen Inject 35 Units into the skin daily. 15 mL 0   loratadine (CLARITIN) 5 MG chewable tablet Chew 1 tablet (5 mg total) by mouth daily. 30 tablet 1   losartan (COZAAR) 100 MG tablet TAKE 1/2 TABLET (50 MG TOTAL) BY MOUTH 2 (TWO) TIMES DAILY. 90 tablet 1   meclizine (ANTIVERT) 25 MG tablet Take 1 tablet (25 mg total) by mouth 3 (three) times daily as needed for dizziness. 30 tablet 0   nitroGLYCERIN (NITROSTAT) 0.4 MG SL tablet Place 1 tablet (0.4 mg total) under the tongue every 5 (five) minutes as needed for chest pain. Maximum of 3 doses. 25 tablet 3   NOVOFINE PEN NEEDLE 32G X 6 MM MISC USE AS DIRECTED WITH LANTUS PEN 100 each 2   TRULICITY 1.5 MG/0.5ML SOPN Inject 1.5 mg as directed once a week.  11   vitamin B-12  (CYANOCOBALAMIN) 1000 MCG tablet Take 1,000 mcg by mouth daily.     No current facility-administered medications on file prior to visit.     ROS see history of present illness  Objective  Physical Exam Vitals:   01/25/23 1152  BP: 124/70  Pulse: 76  Temp: 98.5 F (36.9 C)  SpO2: 99%    BP Readings from Last 3 Encounters:  01/25/23 124/70  01/19/23 (!) 151/71  01/18/23 (!) 180/77   Wt Readings from Last 3 Encounters:  01/25/23 144 lb 3.2 oz (65.4 kg)  01/19/23 144 lb 9.6 oz (65.6 kg)  01/18/23 144 lb (65.3 kg)    Physical Exam Constitutional:      General: She is not in acute distress.    Appearance: She is not diaphoretic.  HENT:     Head:   Cardiovascular:     Rate and Rhythm: Normal rate and regular rhythm.     Heart sounds: Normal heart sounds.  Pulmonary:     Effort: Pulmonary effort is normal.     Breath sounds: Normal breath sounds.  Skin:    General: Skin is warm and dry.  Neurological:     Mental Status: She is alert.      Assessment/Plan: Please see individual problem list.  Essential hypertension Assessment & Plan: Chronic issue.  Patient's syncopal episode likely related to orthostasis due to her blood pressure medications.  Symptoms and blood pressures seem to have improved with reduction in dose of carvedilol to 6.25 mg twice daily.  She will continue that dosing and losartan 100 mg daily.  If she continues to have some lightheadedness this week we could consider reducing her losartan dose to 50 mg daily.  She will let me know if she continues to have some lightheadedness.  She will follow-up with cardiology as planned.  Orders: -     Basic metabolic panel  Leukocytosis, unspecified type -     CBC with Differential/Platelet  Laceration of scalp without foreign body, initial encounter Assessment & Plan: Healing well.  Advised to monitor for signs of infection and if those occur she will contact us immediately.   Allergic rhinitis,  unspecified seasonality, unspecified trigger Assessment & Plan: Chronic issue.  I suspect the patient's current symptoms are likely more allergy related than sinusitis related.  She will trial Claritin over-the-counter.  She will let us know if her sinus symptoms worsen over the next week and then I could consider sending in antibiotics if appropriate.   Benign paroxysmal positional vertigo, unspecified laterality Assessment & Plan: Chronic intermittent issue.  She can take meclizine as needed for this.  She will monitor for drowsiness.  Advised not to drive if she gets drowsy with this medication.     Return for as scheduled.   Kelsey Alar, MD [facility]

## 2023-01-25 NOTE — Assessment & Plan Note (Signed)
Chronic issue.  Patient's syncopal episode likely related to orthostasis due to her blood pressure medications.  Symptoms and blood pressures seem to have improved with reduction in dose of carvedilol to 6.25 mg twice daily.  She will continue that dosing and losartan 100 mg daily.  If she continues to have some lightheadedness this week we could consider reducing her losartan dose to 50 mg daily.  She will let me know if she continues to have some lightheadedness.  She will follow-up with cardiology as planned.

## 2023-02-05 ENCOUNTER — Other Ambulatory Visit: Payer: Self-pay | Admitting: Internal Medicine

## 2023-02-17 DIAGNOSIS — N1832 Chronic kidney disease, stage 3b: Secondary | ICD-10-CM | POA: Diagnosis not present

## 2023-02-17 DIAGNOSIS — R600 Localized edema: Secondary | ICD-10-CM | POA: Diagnosis not present

## 2023-02-17 DIAGNOSIS — E1129 Type 2 diabetes mellitus with other diabetic kidney complication: Secondary | ICD-10-CM | POA: Diagnosis not present

## 2023-02-17 DIAGNOSIS — I10 Essential (primary) hypertension: Secondary | ICD-10-CM | POA: Diagnosis not present

## 2023-02-23 ENCOUNTER — Ambulatory Visit: Payer: Medicare PPO | Attending: Internal Medicine | Admitting: Internal Medicine

## 2023-02-23 ENCOUNTER — Encounter: Payer: Self-pay | Admitting: Internal Medicine

## 2023-02-23 VITALS — BP 148/66 | HR 80 | Ht 62.5 in | Wt 144.2 lb

## 2023-02-23 DIAGNOSIS — R55 Syncope and collapse: Secondary | ICD-10-CM | POA: Diagnosis not present

## 2023-02-23 DIAGNOSIS — I428 Other cardiomyopathies: Secondary | ICD-10-CM | POA: Diagnosis not present

## 2023-02-23 DIAGNOSIS — M79605 Pain in left leg: Secondary | ICD-10-CM | POA: Diagnosis not present

## 2023-02-23 DIAGNOSIS — E785 Hyperlipidemia, unspecified: Secondary | ICD-10-CM

## 2023-02-23 DIAGNOSIS — I2510 Atherosclerotic heart disease of native coronary artery without angina pectoris: Secondary | ICD-10-CM

## 2023-02-23 DIAGNOSIS — E1169 Type 2 diabetes mellitus with other specified complication: Secondary | ICD-10-CM | POA: Diagnosis not present

## 2023-02-23 DIAGNOSIS — Z794 Long term (current) use of insulin: Secondary | ICD-10-CM | POA: Diagnosis not present

## 2023-02-23 DIAGNOSIS — I5022 Chronic systolic (congestive) heart failure: Secondary | ICD-10-CM | POA: Diagnosis not present

## 2023-02-23 DIAGNOSIS — M79604 Pain in right leg: Secondary | ICD-10-CM | POA: Diagnosis not present

## 2023-02-23 NOTE — Progress Notes (Signed)
Cardiology Office Note:  .   Date:  02/23/2023  ID:  Kelsey Mason, DOB 07/12/1940, MRN [id number] PCP: Glori Luis, MD  [facility] Providers Cardiologist:  Yvonne Kendall, MD     History of Present Illness: .   Kelsey Mason is a 82 y.o. female with history of nonobstructive coronary artery disease, peripheral vascular disease with small vessel disease involving the runoff vessels, nonischemic cardiomyopathy, mitral regurgitation, hypertension, hyperlipidemia, type 2 diabetes mellitus, mild to moderate renal artery stenosis, and breast cancer, presenting for follow-up of CAD, PAD, and HFrEF.  I last saw her in May, at which time she was still recovering from a sinus infection.  She was bothered by more "squeezing" in her calves while line dancing.  She was still able to push mow her lawn without any difficulty.  Repeat ABIs showed significant calcification but overall appeared stable to prior study in 2019 with normal TBI's.  She presented to the ED twice last month with dizziness and syncope.  She initially presented with vertigo following another sinus infection and was prescribed meclizine.  That night after taking meclizine, she got lightheaded and briefly passed out after getting up from the toilet.  She reports having some orthostatic hypotension that prompted the EDP to recommend cutting carvedilol in half to 6.25 mg twice daily.  She has not had any further vertigo or orthostatic lightheadedness.  She continues to check her blood pressures regularly, which are typically below 130/80.  Overall, Kelsey Mason feels about the same as at our prior visits, though she notes that the "pulling and tightening" in both calves seems to be a little more pronounced.  She developed some of this pain yesterday while mowing her lawn and continues to have discomfort when walking today.  She also notes some mild redness along the inner part of her right calf.  She has not had any  fevers or chills.  She has been coughing some recently with intermittent clear sputum production.  She denies chest pain, shortness of breath, and palpitations.  ROS: See HPI  Studies Reviewed: [name]   ECG (01/19/2023): Normal sinus rhythm with left bundle branch block.  No significant change from prior tracing on 11/03/2022.  ABIs (12/02/2022): Noncompressible ABIs bilaterally.  TBI is normal bilaterally.  Essentially unchanged from prior study on 02/22/2018. Risk Assessment/Calculations:     HYPERTENSION CONTROL Vitals:   02/23/23 0919 02/23/23 0950  BP: (!) 144/70 (!) 148/66    The patient's blood pressure is elevated above target today.  In order to address the patient's elevated BP: Blood pressure will be monitored at home to determine if medication changes need to be made.          Physical Exam:   VS:  BP (!) 148/66   Pulse 80   Ht 5' 2.5" (1.588 m)   Wt 144 lb 4 oz (65.4 kg)   SpO2 97%   BMI 25.96 kg/m    Wt Readings from Last 3 Encounters:  02/23/23 144 lb 4 oz (65.4 kg)  01/25/23 144 lb 3.2 oz (65.4 kg)  01/19/23 144 lb 9.6 oz (65.6 kg)    General:  NAD. Neck: No JVD or HJR. Lungs: Clear to auscultation bilaterally without wheezes or crackles. Heart: Regular rate and rhythm with 2/6 systolic murmur.  No rubs or gallops. Abdomen: Soft, nontender, nondistended. Extremities: No lower extremity edema.  Minimal erythema noted along the anterior medial portion of the right distal calf.  No warmth or swelling  noted.  Varicose veins are seen in both calves.  ASSESSMENT AND PLAN: .    Leg pain: This has been a chronic complaint for Kelsey Mason but seems to be getting a little bit worse.  Recent ABIs showed calcified runoff vessels though TBI's remain in the normal range.  Her pedal pulses are also normal on exam today.  I do not think that obstructive PAD is driving her symptoms.  She may have some element of venous insufficiency contributing to her symptoms as well.  I  encouraged her to think about elevating her legs and using compression stockings when possible.  We also discussed a lower extremity venous duplex, though Kelsey Mason wished to defer this today.  I also suspect that some of her calf pain could be due to lumbar spine disease, as a lumbar spine MRI in 2020 made note of moderate to severe lumbar spine stenosis.  Kelsey Mason notes that she saw a back specialist but was told that surgery was not an option for her.  I have encouraged her to reach out to her spine specialist and/or PCP again to see if repeat imaging and education changes/intervention, would be helpful.  Chronic HFrEF due to nonischemic cardiomyopathy: Kelsey Mason appears euvolemic on exam today and does not have any symptoms of heart failure.  Given recent concerns for orthostatic hypotension, we will plan to continue reduced dose of carvedilol (6.25 mg twice daily) as well as losartan 50 mg twice daily and dapagliflozin 10 mg daily.  It may be worthwhile to repeat an echocardiogram, particularly with recent syncope (though this was an isolated event of orthostatic lightheadedness and possible medication side effect); Kelsey Mason wishes to defer additional testing today.  Nonobstructive coronary artery disease and hyperlipidemia associated with type 2 diabetes mellitus: No angina reported.  Continue ezetimibe and evolocumab.  Syncope: This was an isolated event in the setting of vertigo and initial trial of meclizine.  Workup in the ED was notable for some orthostatic hypotension as well.  We have agreed to defer further workup at this time, though I asked Kelsey Mason to contact us if she has any further lightheadedness or near syncope so that we can discuss further testing.  Hypertension: Blood pressure mildly elevated in the office today but typically better at home.  Given recent orthostatic hypotension, we will defer medication changes today.  I have asked Kelsey Mason to contact us if her  blood pressure is consistently above 130/80.    Dispo: Return to clinic in 6 months.  Signed, Yvonne Kendall, MD

## 2023-02-23 NOTE — Patient Instructions (Signed)

## 2023-03-06 ENCOUNTER — Other Ambulatory Visit: Payer: Self-pay | Admitting: Nurse Practitioner

## 2023-03-12 ENCOUNTER — Other Ambulatory Visit: Payer: Self-pay | Admitting: Internal Medicine

## 2023-04-15 ENCOUNTER — Other Ambulatory Visit: Payer: Self-pay | Admitting: Family Medicine

## 2023-04-15 DIAGNOSIS — Z1231 Encounter for screening mammogram for malignant neoplasm of breast: Secondary | ICD-10-CM

## 2023-05-10 ENCOUNTER — Other Ambulatory Visit: Payer: Self-pay | Admitting: Internal Medicine

## 2023-05-17 DIAGNOSIS — N1832 Chronic kidney disease, stage 3b: Secondary | ICD-10-CM | POA: Diagnosis not present

## 2023-05-17 DIAGNOSIS — E1129 Type 2 diabetes mellitus with other diabetic kidney complication: Secondary | ICD-10-CM | POA: Diagnosis not present

## 2023-05-17 DIAGNOSIS — R600 Localized edema: Secondary | ICD-10-CM | POA: Diagnosis not present

## 2023-05-17 DIAGNOSIS — I10 Essential (primary) hypertension: Secondary | ICD-10-CM | POA: Diagnosis not present

## 2023-05-20 ENCOUNTER — Ambulatory Visit
Admission: RE | Admit: 2023-05-20 | Discharge: 2023-05-20 | Disposition: A | Discharge status: Home / Self Care | Payer: Medicare PPO | Source: Ambulatory Visit | Attending: Family Medicine | Admitting: Family Medicine

## 2023-05-20 DIAGNOSIS — Z1231 Encounter for screening mammogram for malignant neoplasm of breast: Secondary | ICD-10-CM | POA: Insufficient documentation

## 2023-05-24 DIAGNOSIS — R600 Localized edema: Secondary | ICD-10-CM | POA: Diagnosis not present

## 2023-05-24 DIAGNOSIS — I10 Essential (primary) hypertension: Secondary | ICD-10-CM | POA: Diagnosis not present

## 2023-05-24 DIAGNOSIS — E1129 Type 2 diabetes mellitus with other diabetic kidney complication: Secondary | ICD-10-CM | POA: Diagnosis not present

## 2023-05-24 DIAGNOSIS — N1832 Chronic kidney disease, stage 3b: Secondary | ICD-10-CM | POA: Diagnosis not present

## 2023-06-01 DIAGNOSIS — E1122 Type 2 diabetes mellitus with diabetic chronic kidney disease: Secondary | ICD-10-CM | POA: Diagnosis not present

## 2023-06-01 DIAGNOSIS — N1832 Chronic kidney disease, stage 3b: Secondary | ICD-10-CM | POA: Diagnosis not present

## 2023-06-09 DIAGNOSIS — I10 Essential (primary) hypertension: Secondary | ICD-10-CM | POA: Diagnosis not present

## 2023-06-09 DIAGNOSIS — E1169 Type 2 diabetes mellitus with other specified complication: Secondary | ICD-10-CM | POA: Diagnosis not present

## 2023-06-09 DIAGNOSIS — N1832 Chronic kidney disease, stage 3b: Secondary | ICD-10-CM | POA: Diagnosis not present

## 2023-06-09 DIAGNOSIS — E1159 Type 2 diabetes mellitus with other circulatory complications: Secondary | ICD-10-CM | POA: Diagnosis not present

## 2023-06-09 DIAGNOSIS — E1122 Type 2 diabetes mellitus with diabetic chronic kidney disease: Secondary | ICD-10-CM | POA: Diagnosis not present

## 2023-06-09 DIAGNOSIS — E785 Hyperlipidemia, unspecified: Secondary | ICD-10-CM | POA: Diagnosis not present

## 2023-06-10 ENCOUNTER — Ambulatory Visit: Payer: Medicare PPO | Admitting: Family Medicine

## 2023-06-10 ENCOUNTER — Encounter: Payer: Self-pay | Admitting: Family Medicine

## 2023-06-10 VITALS — BP 120/78 | HR 76 | Temp 98.3°F | Ht 62.5 in | Wt 150.6 lb

## 2023-06-10 DIAGNOSIS — Z794 Long term (current) use of insulin: Secondary | ICD-10-CM

## 2023-06-10 DIAGNOSIS — I10 Essential (primary) hypertension: Secondary | ICD-10-CM | POA: Diagnosis not present

## 2023-06-10 DIAGNOSIS — H8110 Benign paroxysmal vertigo, unspecified ear: Secondary | ICD-10-CM | POA: Diagnosis not present

## 2023-06-10 DIAGNOSIS — E1122 Type 2 diabetes mellitus with diabetic chronic kidney disease: Secondary | ICD-10-CM | POA: Diagnosis not present

## 2023-06-10 DIAGNOSIS — I5022 Chronic systolic (congestive) heart failure: Secondary | ICD-10-CM

## 2023-06-10 DIAGNOSIS — R55 Syncope and collapse: Secondary | ICD-10-CM | POA: Insufficient documentation

## 2023-06-10 DIAGNOSIS — N1832 Chronic kidney disease, stage 3b: Secondary | ICD-10-CM

## 2023-06-10 MED ORDER — MECLIZINE HCL 25 MG PO TABS: 25.0000 mg | ORAL_TABLET | Freq: Three times a day (TID) | ORAL | 0 refills | Status: DC | PRN

## 2023-06-10 NOTE — Assessment & Plan Note (Signed)
Chronic intermittent issue.  She can continue to take meclizine as needed for this.  Refill provided.

## 2023-06-10 NOTE — Assessment & Plan Note (Signed)
Chronic issue.  Adequately controlled.  Patient will continue losartan 50 mg twice daily and carvedilol half a tablet twice daily.

## 2023-06-10 NOTE — Assessment & Plan Note (Signed)
Likely related to orthostasis.  Has not recurred.  Patient will monitor for recurrent syncope or lightheadedness and let cardiology know if this occurs.

## 2023-06-10 NOTE — Assessment & Plan Note (Signed)
Chronic issue.  Patient does have some mild edema.  She will monitor and continue Lasix 20 mg every other day.  If swelling worsens or if she develops breathing issues related to this she will let us or cardiology know.

## 2023-06-10 NOTE — Progress Notes (Signed)
[name], MD Phone: [phone]  Kelsey Mason is a 82 y.o. female who presents today for f/u.  DIABETES Disease Monitoring: A1c yesterday 5.8 Polyuria/phagia/dipsia- no      Optho- UTD Medications: Compliance- taking farxiga, trulicity, Lantus, patient reports her endocrinologist cut her Lantus to 18 units.  She was taking 30 units. Hypoglycemic symptoms-yes, she would eat something and feel better  HYPERTENSION Disease Monitoring Home BP Monitoring not checking Chest pain- no    Dyspnea- no Medications Compliance-  taking coreg, losartan. Takes Lasix every other day. Edema-yes over the last month, goes down overnight, Lasix is helpful BMET    Component Value Date/Time   NA 135 01/25/2023 1205   NA 137 02/19/2019 0000   NA 135 (L) 01/07/2014 1000   K 4.9 01/25/2023 1205   K 4.7 01/07/2014 1000   CL 105 01/25/2023 1205   CL 98 01/07/2014 1000   CO2 23 01/25/2023 1205   CO2 30 01/07/2014 1000   GLUCOSE 121 (H) 01/25/2023 1205   GLUCOSE 268 (H) 01/07/2014 1000   BUN 27 (H) 01/25/2023 1205   BUN 19 02/19/2019 0000   BUN 19 (H) 01/07/2014 1000   CREATININE 1.49 (H) 01/25/2023 1205   CREATININE 1.24 01/07/2014 1000   CALCIUM 9.2 01/25/2023 1205   CALCIUM 10.0 01/07/2014 1000   GFRNONAA 37 (L) 01/18/2023 1532   GFRNONAA 43 (L) 01/07/2014 1000   GFRAA 30 (L) 12/24/2019 0903   GFRAA 50 (L) 01/07/2014 1000   Syncope: Patient reports a syncopal episode over the summer.  She has seen cardiology for this.  She notes they reduced her carvedilol dose in half.  Has not had any recurrent lightheadedness or syncope since then.  BPPV: Patient had an episode of vertigo over the summer.  She is requesting a refill on meclizine to have on hand.   Social History   Tobacco Use  Smoking Status Former   Current packs/day: 0.00   Average packs/day: 0.3 packs/day for 1 year (0.3 ttl pk-yrs)   Types: Cigarettes   Start date: 23   Quit date: 1963   Years since  quitting: 61.9   Passive exposure: Never  Smokeless Tobacco Never    Current Outpatient Medications on File Prior to Visit  Medication Sig Dispense Refill   albuterol (VENTOLIN HFA) 108 (90 Base) MCG/ACT inhaler TAKE 2 PUFFS BY MOUTH EVERY 6 HOURS AS NEEDED FOR WHEEZE OR SHORTNESS OF BREATH 18 each 11   aspirin 81 MG tablet Take 81 mg by mouth daily.     blood glucose meter kit and supplies KIT Dispense based on patient and insurance preference. Use up to four times daily as directed. One Touch Ultra mini meter (FOR ICD-9 250.00, 250.01). Dx: E11.9 1 each 0   carvedilol (COREG) 12.5 MG tablet TAKE 1 TABLET BY MOUTH TWICE A DAY 180 tablet 0   cholecalciferol (VITAMIN D) 25 MCG (1000 UT) tablet Take 1,000 Units by mouth daily.     dapagliflozin propanediol (FARXIGA) 10 MG TABS tablet Take 10 mg by mouth daily.     Evolocumab (REPATHA SURECLICK) 140 MG/ML SOAJ INJECT 140 MG INTO THE SKIN EVERY 14 (FOURTEEN) DAYS. 2 mL 11   furosemide (LASIX) 20 MG tablet TAKE 1 TABLET (20 MG TOTAL) BY MOUTH DAILY AS NEEDED (FOR >2LB WEIGHT GAIN OVERNIGHT OR 5 LB WEIGHT GAIN IN 1 WEEK OR SWELLING.). 90 tablet 3   glucose blood (ONE TOUCH ULTRA TEST) test strip USE TO TEST BLOOD SUGAR UP TO 4  TIMES DAILY 100 each 6   HYDROcodone bit-homatropine (HYCODAN) 5-1.5 MG/5ML syrup Take 5 mLs by mouth every 8 (eight) hours as needed for cough. 120 mL 0   insulin glargine (LANTUS SOLOSTAR) 100 UNIT/ML Solostar Pen Inject 35 Units into the skin daily. 15 mL 0   loratadine (CLARITIN) 5 MG chewable tablet Chew 1 tablet (5 mg total) by mouth daily. 30 tablet 1   losartan (COZAAR) 100 MG tablet TAKE 1/2 TABLET (50 MG TOTAL) BY MOUTH 2 (TWO) TIMES DAILY. 90 tablet 0   nitroGLYCERIN (NITROSTAT) 0.4 MG SL tablet Place 1 tablet (0.4 mg total) under the tongue every 5 (five) minutes as needed for chest pain. Maximum of 3 doses. 25 tablet 3   NOVOFINE PEN NEEDLE 32G X 6 MM MISC USE AS DIRECTED WITH LANTUS PEN 100 each 2   TRULICITY 1.5  MG/0.5ML SOPN Inject 1.5 mg as directed once a week.  11   vitamin B-12 (CYANOCOBALAMIN) 1000 MCG tablet Take 1,000 mcg by mouth daily.     ezetimibe (ZETIA) 10 MG tablet Take 1 tablet (10 mg total) by mouth daily. 90 tablet 3   No current facility-administered medications on file prior to visit.     ROS see history of present illness  Objective  Physical Exam Vitals:   06/10/23 0811  BP: 120/78  Pulse: 76  Temp: 98.3 F (36.8 C)  SpO2: 98%    BP Readings from Last 3 Encounters:  06/10/23 120/78  02/23/23 (!) 148/66  01/25/23 124/70   Wt Readings from Last 3 Encounters:  06/10/23 150 lb 9.6 oz (68.3 kg)  02/23/23 144 lb 4 oz (65.4 kg)  01/25/23 144 lb 3.2 oz (65.4 kg)    Physical Exam Constitutional:      General: She is not in acute distress.    Appearance: She is not diaphoretic.  Cardiovascular:     Rate and Rhythm: Normal rate and regular rhythm.     Heart sounds: Normal heart sounds.  Pulmonary:     Effort: Pulmonary effort is normal.     Breath sounds: Normal breath sounds.  Musculoskeletal:     Comments: 1+ pitting edema bilateral lower extremities  Skin:    General: Skin is warm and dry.  Neurological:     Mental Status: She is alert.      Assessment/Plan: Please see individual problem list.  Essential hypertension Assessment & Plan: Chronic issue.  Adequately controlled.  Patient will continue losartan 50 mg twice daily and carvedilol half a tablet twice daily.   Syncope, unspecified syncope type Assessment & Plan: Likely related to orthostasis.  Has not recurred.  Patient will monitor for recurrent syncope or lightheadedness and let cardiology know if this occurs.   Type 2 diabetes mellitus with stage 3b chronic kidney disease, with long-term current use of insulin (HCC) Assessment & Plan: Chronic issue.  Very well-controlled.  Patient will continue Farxiga 10 mg daily, Lantus 18 units daily, and and Trulicity 1.5 mg weekly.  She will  continue to see endocrinology.   Benign paroxysmal positional vertigo, unspecified laterality Assessment & Plan: Chronic intermittent issue.  She can continue to take meclizine as needed for this.  Refill provided.   Chronic HFrEF (heart failure with reduced ejection fraction) (HCC) Assessment & Plan: Chronic issue.  Patient does have some mild edema.  She will monitor and continue Lasix 20 mg every other day.  If swelling worsens or if she develops breathing issues related to this she will let us  or cardiology know.   Other orders -     Meclizine HCl; Take 1 tablet (25 mg total) by mouth 3 (three) times daily as needed for dizziness.  Dispense: 30 tablet; Refill: 0    Return in about 6 months (around 12/09/2023) for dr Clent Ridges.   [name], MD [facility]

## 2023-06-10 NOTE — Assessment & Plan Note (Signed)
Chronic issue.  Very well-controlled.  Patient will continue Farxiga 10 mg daily, Lantus 18 units daily, and and Trulicity 1.5 mg weekly.  She will continue to see endocrinology.

## 2023-06-13 ENCOUNTER — Ambulatory Visit (INDEPENDENT_AMBULATORY_CARE_PROVIDER_SITE_OTHER): Payer: Medicare PPO | Admitting: *Deleted

## 2023-06-13 VITALS — Ht 62.5 in | Wt 146.1 lb

## 2023-06-13 DIAGNOSIS — Z0000 Encounter for general adult medical examination without abnormal findings: Secondary | ICD-10-CM | POA: Diagnosis not present

## 2023-06-13 NOTE — Patient Instructions (Signed)
Ms. Imai , Thank you for taking time to come for your Medicare Wellness Visit. I appreciate your ongoing commitment to your health goals. Please review the following plan we discussed and let me know if I can assist you in the future.   Referrals/Orders/Follow-Ups/Clinician Recommendations: None  This is a list of the screening recommended for you and due dates:  Health Maintenance  Topic Date Due   Hemoglobin A1C  06/07/2023   COVID-19 Vaccine (9 - 2023-24 season) 06/26/2023*   Eye exam for diabetics  06/15/2023   Yearly kidney health urinalysis for diabetes  08/25/2023   Complete foot exam   12/08/2023   Yearly kidney function blood test for diabetes  01/25/2024   Mammogram  05/19/2024   Medicare Annual Wellness Visit  06/12/2024   DTaP/Tdap/Td vaccine (2 - Td or Tdap) 01/18/2033   Flu Shot  Completed   DEXA scan (bone density measurement)  Completed   HPV Vaccine  Aged Out   Pneumonia Vaccine  Discontinued   Zoster (Shingles) Vaccine  Discontinued  *Topic was postponed. The date shown is not the original due date.    Advanced directives: (Copy Requested) Please bring a copy of your health care power of attorney and living will to the office to be added to your chart at your convenience.  Next Medicare Annual Wellness Visit scheduled for next year: Yes 06/13/24 @ 2:20

## 2023-06-13 NOTE — Progress Notes (Signed)
Subjective:   Kelsey Mason is a 82 y.o. female who presents for Medicare Annual (Subsequent) preventive examination.  Visit Complete: Virtual I connected with  Kelsey Mason on 06/13/23 by a audio enabled telemedicine application and verified that I am speaking with the correct person using two identifiers.  Patient Location: Home  Provider Location: Office/Clinic  I discussed the limitations of evaluation and management by telemedicine. The patient expressed understanding and agreed to proceed.  Vital Signs: Because this visit was a virtual/telehealth visit, some criteria may be missing or patient reported. Any vitals not documented were not able to be obtained and vitals that have been documented are patient reported.   Cardiac Risk Factors include: advanced age (>69men, >62 women);diabetes mellitus;hypertension;Other (see comment), Risk factor comments: CAD     Objective:    Today's Vitals   06/13/23 1422  Weight: 146 lb 2 oz (66.3 kg)  Height: 5' 2.5" (1.588 m)   Body mass index is 26.3 kg/m.     06/13/2023    2:38 PM 01/19/2023    7:52 AM 01/18/2023    3:32 PM 11/18/2022   11:10 AM 06/08/2022   12:43 PM 05/28/2022    9:52 AM 11/13/2021   10:16 AM  Advanced Directives  Does Patient Have a Medical Advance Directive? Yes Yes Yes Yes Yes No Yes  Type of Estate agent of [place];Living will Living will;Healthcare Power of [employer] Power of [place];Living will Healthcare Power of [place];Living will Healthcare Power of [place];Living will  Healthcare Power of [place];Living will  Does patient want to make changes to medical advance directive?  No - Patient declined   No - Patient declined    Copy of Healthcare Power of Attorney in Chart? No - copy requested No - copy requested  No - copy requested Yes - validated most recent copy scanned in chart (See row information)    Would patient like information on creating a medical  advance directive?      No - Patient declined     Current Medications (verified) Outpatient Encounter Medications as of 06/13/2023  Medication Sig   aspirin 81 MG tablet Take 81 mg by mouth daily.   blood glucose meter kit and supplies KIT Dispense based on patient and insurance preference. Use up to four times daily as directed. One Touch Ultra mini meter (FOR ICD-9 250.00, 250.01). Dx: E11.9   carvedilol (COREG) 12.5 MG tablet TAKE 1 TABLET BY MOUTH TWICE A DAY   cholecalciferol (VITAMIN D) 25 MCG (1000 UT) tablet Take 1,000 Units by mouth daily.   dapagliflozin propanediol (FARXIGA) 10 MG TABS tablet Take 10 mg by mouth daily.   Evolocumab (REPATHA SURECLICK) 140 MG/ML SOAJ INJECT 140 MG INTO THE SKIN EVERY 14 (FOURTEEN) DAYS.   ezetimibe (ZETIA) 10 MG tablet Take 1 tablet (10 mg total) by mouth daily.   furosemide (LASIX) 20 MG tablet TAKE 1 TABLET (20 MG TOTAL) BY MOUTH DAILY AS NEEDED (FOR >2LB WEIGHT GAIN OVERNIGHT OR 5 LB WEIGHT GAIN IN 1 WEEK OR SWELLING.).   glucose blood (ONE TOUCH ULTRA TEST) test strip USE TO TEST BLOOD SUGAR UP TO 4 TIMES DAILY   HYDROcodone bit-homatropine (HYCODAN) 5-1.5 MG/5ML syrup Take 5 mLs by mouth every 8 (eight) hours as needed for cough.   insulin glargine (LANTUS SOLOSTAR) 100 UNIT/ML Solostar Pen Inject 35 Units into the skin daily.   loratadine (CLARITIN) 5 MG chewable tablet Chew 1 tablet (5 mg total) by mouth daily.  losartan (COZAAR) 100 MG tablet TAKE 1/2 TABLET (50 MG TOTAL) BY MOUTH 2 (TWO) TIMES DAILY.   meclizine (ANTIVERT) 25 MG tablet Take 1 tablet (25 mg total) by mouth 3 (three) times daily as needed for dizziness.   nitroGLYCERIN (NITROSTAT) 0.4 MG SL tablet Place 1 tablet (0.4 mg total) under the tongue every 5 (five) minutes as needed for chest pain. Maximum of 3 doses.   NOVOFINE PEN NEEDLE 32G X 6 MM MISC USE AS DIRECTED WITH LANTUS PEN   TRULICITY 1.5 MG/0.5ML SOPN Inject 1.5 mg as directed once a week.   vitamin B-12  (CYANOCOBALAMIN) 1000 MCG tablet Take 1,000 mcg by mouth daily.   albuterol (VENTOLIN HFA) 108 (90 Base) MCG/ACT inhaler TAKE 2 PUFFS BY MOUTH EVERY 6 HOURS AS NEEDED FOR WHEEZE OR SHORTNESS OF BREATH (Patient not taking: Reported on 06/13/2023)   No facility-administered encounter medications on file as of 06/13/2023.    Allergies (verified) Prednisone, Advil [ibuprofen], Aleve [naproxen], Fluticasone-salmeterol, Lipitor [atorvastatin], Naproxen sodium, and Penicillins   History: Past Medical History:  Diagnosis Date   Arthritis    fingers   Breast symptom 2014   Cancer [facility] 2014   Bilateral Breast, chemo Dr. Koleen Nimrod   Chronic HFmrEF (heart failure with mid-range ejection fraction) (HCC)    a. 01/2017 Echo: EF 50-55%; b. 03/2018 Echo: EF 40-45%; c. 01/2020 Echo: EF 40%, glob HK, mild LVH, GrI DD.   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 01/2020 mild-mod MR.   NICM (nonischemic cardiomyopathy) (HCC)    a. 01/2017 Echo: EF 50-55%; b. 03/2018 Echo: EF 40-45%; c. 01/2020 Echo: EF 40%, glob HK, mild LVH, GrI DD, nl RV fxn, mildly dil LA, mild to mod MR.   Nonobstructive CAD (coronary artery disease)    a. 06/2016 MV: low risk; b. 04/2018 Cath: LM nl, LAD 70m, D1/2/3 nl, LCX nl, OM3 55ost Ca2+, RCA 50ost Ca2+.   PAD (peripheral artery disease) (HCC)    a. 04/2018 Lower ext angio: L pop 10m, R AT 100, R Peroneal 100d.   Personal history of chemotherapy    Personal history of radiation therapy 2014   bilat lumpectomy   Past Surgical History:  Procedure Laterality Date   ABDOMINAL AORTOGRAM W/LOWER EXTREMITY N/A 04/21/2018   Procedure: ABDOMINAL AORTOGRAM W/LOWER EXTREMITY;  Surgeon: Yvonne Kendall, MD;  Location: [facility];  Service: Cardiovascular;  Laterality: N/A;   ABDOMINAL HYSTERECTOMY  1976   for  pelvic pain   BREAST EXCISIONAL BIOPSY Bilateral 11/2012   bilat breast ca chemo and rad   BREAST SURGERY Bilateral 2014   bilateral lumpectomy and SN biopsy   CATARACT EXTRACTION W/PHACO Left 07/30/2020   Procedure: CATARACT EXTRACTION PHACO AND INTRAOCULAR LENS PLACEMENT (IOC) LEFT DIABETIC 18.73 01:45.0 17.8%;  Surgeon: Lockie Mola, MD;  Location: [facility];  Service: Ophthalmology;  Laterality: Left;  Diabetic - insulin and opral meds   CATARACT EXTRACTION W/PHACO Right 08/13/2020   Procedure: CATARACT EXTRACTION PHACO AND INTRAOCULAR LENS PLACEMENT (IOC) RIGHT DIABETIC;  Surgeon: Lockie Mola, MD;  Location: [facility];  Service: Ophthalmology;  Laterality: Right;  10.67 1:27.8 12.1%   CHOLECYSTECTOMY  2007   COLONOSCOPY WITH PROPOFOL N/A 10/13/2015   Procedure: COLONOSCOPY WITH PROPOFOL;  Surgeon: Scot Jun, MD;  Location:  [facility] END[facility];  Service: Endoscopy;  Laterality: N/A;   LEFT HEART CATH AND CORONARY ANGIOGRAPHY N/A 04/21/2018   Procedure: LEFT HEART CATH AND CORONARY ANGIOGRAPHY;  Surgeon: Yvonne Kendall, MD;  Location: [facility];  Service: Cardiovascular;  Laterality: N/A;   OOPHORECTOMY Right    PORTACATH PLACEMENT  2014   TUMOR REMOVAL Right 1980   right foot   Family History  Problem Relation Age of Onset   Alcohol abuse Mother    Heart disease Mother    Hypertension Mother    Breast cancer Mother 9   Alcohol abuse Father    Heart disease Father    Hypertension Father    Heart disease Brother    Heart attack Brother    Social History   Socioeconomic History   Marital status: Widowed    Spouse name: Not on file   Number of children: Not on file   Years of education: Not on file   Highest education level: Not on file  Occupational History   Not on file  Tobacco Use   Smoking status: Former    Current packs/day: 0.00    Average packs/day: 0.3 packs/day for 1 year (0.3 ttl pk-yrs)    Types: Cigarettes     Start date: 29    Quit date: 1963    Years since quitting: 61.9    Passive exposure: Never   Smokeless tobacco: Never  Vaping Use   Vaping status: Never Used  Substance and Sexual Activity   Alcohol use: No    Alcohol/week: 0.0 standard drinks of alcohol   Drug use: No   Sexual activity: Never  Other Topics Concern   Not on file  Social History Narrative   Lives in [place].       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   Diet - regular   Exercise - dance 3x per week   Social Determinants of Health   Financial Resource Strain: Low Risk  (06/13/2023)   Overall Financial Resource Strain (CARDIA)    Difficulty of Paying Living Expenses: Not hard at all  Food Insecurity: No Food Insecurity (06/13/2023)   Hunger Vital Sign    Worried About Running Out of Food in the Last Year: Never true    Ran Out of Food in the Last Year: Never true  Transportation Needs: No Transportation Needs (06/13/2023)   PRAPARE - Administrator, Civil Service (Medical): No    Lack of Transportation (Non-Medical): No  Physical Activity: Unknown (06/13/2023)   Exercise Vital Sign    Days of Exercise per Week: Not on file    Minutes of Exercise per Session: 10 min  Stress: No Stress Concern Present (06/13/2023)   [employer] of Occupational Health - Occupational Stress Questionnaire    Feeling of Stress : Not at all  Social Connections: Socially Isolated (06/13/2023)   Social Connection and Isolation Panel [NHANES]    Frequency of Communication with Friends and Family: More than three times a week    Frequency of Social Gatherings with Friends and Family: Once a week    Attends Religious Services: Never    Database administrator or Organizations: No    Attends Banker Meetings: Never    Marital Status: Widowed    Tobacco Counseling Counseling given: Not Answered   Clinical Intake:  Pre-visit preparation completed: Yes  Pain : No/denies pain     BMI - recorded:  26.3 Nutritional Status: BMI 25 -29 Overweight Nutritional Risks:  None Diabetes: Yes CBG done?: Yes (FBS 90) CBG resulted in Enter/ Edit results?: No Did pt. bring in CBG monitor from home?: No  How often do you need to have someone help you when you read instructions, pamphlets, or other written materials from your doctor or pharmacy?: 1 - Never  Interpreter Needed?: No  Information entered by :: R. Joselyn Edling LPN   Activities of Daily Living    06/13/2023    2:24 PM  In your present state of health, do you have any difficulty performing the following activities:  Hearing? 0  Vision? 0  Comment readers  Difficulty concentrating or making decisions? 0  Walking or climbing stairs? 0  Dressing or bathing? 0  Doing errands, shopping? 0  Preparing Food and eating ? N  Using the Toilet? N  In the past six months, have you accidently leaked urine? N  Do you have problems with loss of bowel control? N  Managing your Medications? N  Managing your Finances? N  Housekeeping or managing your Housekeeping? N    Patient Care Team: Glori Luis, MD as PCP - General (Family Medicine) End, Cristal Deer, MD as PCP - Cardiology (Cardiology) Kieth Brightly, MD as Consulting Physician (General Surgery) Tedd Sias Marlana Salvage, MD as Physician Assistant (Endocrinology)  Indicate any recent Medical Services you may have received from other than [facility] providers in the past year (date may be approximate).     Assessment:   This is a routine wellness examination for Terisha.  Hearing/Vision screen Hearing Screening - Comments:: No issues Vision Screening - Comments:: readers   Goals Addressed             This Visit's Progress    Patient Stated       Patient wants to try to walk more       Depression Screen    06/13/2023    2:34 PM 06/10/2023    8:13 AM 01/25/2023   11:53 AM 12/08/2022    8:25 AM 09/30/2022   12:11 PM 09/07/2022   10:44 AM 06/08/2022   12:42 PM  PHQ 2/9 Scores   PHQ - 2 Score 0 0 0 0 0 0 0  PHQ- 9 Score 0 0 0 0 7 4     Fall Risk    06/13/2023    2:26 PM 06/10/2023    8:13 AM 01/25/2023   11:53 AM 12/08/2022    8:25 AM 09/30/2022   12:11 PM  Fall Risk   Falls in the past year? 1 0 1 0 0  Number falls in past yr: 0 0 0 0 0  Injury with Fall? 1 0 1 0 0  Comment went to ED      Risk for fall due to : History of fall(s) No Fall Risks History of fall(s) No Fall Risks No Fall Risks  Risk for fall due to: Comment vertigo and passed out      Follow up Falls evaluation completed;Falls prevention discussed Falls evaluation completed Falls evaluation completed Falls evaluation completed Falls evaluation completed    MEDICARE RISK AT HOME: Medicare Risk at Home Any stairs in or around the home?: Yes If so, are there any without handrails?: No Home free of loose throw rugs in walkways, pet beds, electrical cords, etc?: Yes Adequate lighting in your home to reduce risk of falls?: Yes Life alert?: No Use of a cane, walker or w/c?: No Grab bars in the bathroom?: No Shower chair or bench in shower?: No Elevated  toilet seat or a handicapped toilet?: Yes   Cognitive Function:    04/11/2018   10:32 AM 04/08/2017   10:49 AM 01/30/2016    7:54 AM  MMSE - Mini Mental State Exam  Orientation to time 3 3 5   Orientation to time comments Asked to draw a clock face of time 11:10. Time shown 11:50. difficulty reading the face of a clock correctly   Orientation to Place 5 5 5   Registration 3 3 3   Attention/ Calculation 3 3 5   Attention/Calculation-comments Asked to subtract by 3's Difficulty performing simple calculations   Recall 3 3 3   Language- name 2 objects 2 2 2   Language- repeat 1 1 1   Language- follow 3 step command 3 3 3   Language- read & follow direction 1 1 1   Write a sentence 1 1 1   Copy design 1 1 1   Total score 26 26 30         06/13/2023    2:39 PM 04/17/2019   10:21 AM  6CIT Screen  What Year? 0 points 0 points  What month? 0 points 0  points  What time? 0 points 0 points  Count back from 20 0 points 0 points  Months in reverse 0 points 0 points  Repeat phrase 2 points 0 points  Total Score 2 points 0 points    Immunizations Immunization History  Administered Date(s) Administered   Fluad Quad(high Dose 65+) 04/26/2019, 02/02/2021, 03/19/2022   Influenza Whole 03/26/2012, 04/10/2013   Influenza, High Dose Seasonal PF 06/03/2016, 03/24/2017, 04/11/2018, 03/06/2020, 03/17/2023   Influenza,inj,Quad PF,6+ Mos 05/15/2014, 03/25/2015   Influenza-Unspecified 05/15/2014, 03/25/2015, 04/26/2019   Moderna Covid-19 Fall Seasonal Vaccine 52yrs & older 03/17/2023   PFIZER Comirnaty(Gray Top)Covid-19 Tri-Sucrose Vaccine 08/28/2019, 09/18/2019, 04/01/2020, 12/15/2020, 03/30/2021   PFIZER(Purple Top)SARS-COV-2 Vaccination 08/28/2019, 09/18/2019   Pneumococcal Conjugate-13 02/05/2019   Tdap 01/19/2023    TDAP status: Up to date  Flu Vaccine status: Up to date  Pneumococcal vaccine status: Declined,  Education has been provided regarding the importance of this vaccine but patient still declined. Advised may receive this vaccine at local pharmacy or Health Dept. Aware to provide a copy of the vaccination record if obtained from local pharmacy or Health Dept. Verbalized acceptance and understanding.   Covid-19 vaccine status: Completed vaccines  Qualifies for Shingles Vaccine? Yes   Zostavax completed No   Shingrix Completed?: No.    Education has been provided regarding the importance of this vaccine. Patient has been advised to call insurance company to determine out of pocket expense if they have not yet received this vaccine. Advised may also receive vaccine at local pharmacy or Health Dept. Verbalized acceptance and understanding.  Screening Tests Health Maintenance  Topic Date Due   Medicare Annual Wellness (AWV)  06/09/2023   HEMOGLOBIN A1C  06/07/2023   COVID-19 Vaccine (9 - 2023-24 season) 06/26/2023 (Originally  05/12/2023)   OPHTHALMOLOGY EXAM  06/15/2023   Diabetic kidney evaluation - Urine ACR  08/25/2023   FOOT EXAM  12/08/2023   Diabetic kidney evaluation - eGFR measurement  01/25/2024   DTaP/Tdap/Td (2 - Td or Tdap) 01/18/2033   INFLUENZA VACCINE  Completed   DEXA SCAN  Completed   HPV VACCINES  Aged Out   Pneumonia Vaccine 34+ Years old  Discontinued   Zoster Vaccines- Shingrix  Discontinued    Health Maintenance  Health Maintenance Due  Topic Date Due   Medicare Annual Wellness (AWV)  06/09/2023   HEMOGLOBIN A1C  06/07/2023  Colorectal cancer screening: No longer required.   Mammogram status: Completed 05/2023. Repeat every year  Bone Density status: Completed 12/2019. Results reflect: Bone density results: OSTEOPENIA. Repeat every 2 years. Patient declines  Lung Cancer Screening: (Low Dose CT Chest recommended if Age 44-80 years, 20 pack-year currently smoking OR have quit w/in 15years.) does not qualify.     Additional Screening:  Hepatitis C Screening: does not qualify; Completed NA age  Vision Screening: Recommended annual ophthalmology exams for early detection of glaucoma and other disorders of the eye. Is the patient up to date with their annual eye exam?  Yes  Who is the provider or what is the name of the office in which the patient attends annual eye exams? [facility] If pt is not established with a provider, would they like to be referred to a provider to establish care? No .   Dental Screening: Recommended annual dental exams for proper oral hygiene  Diabetic Foot Exam: Diabetic Foot Exam: Completed 12/2022  Community Resource Referral / Chronic Care Management: CRR required this visit?  No   CCM required this visit?  No     Plan:     I have personally reviewed and noted the following in the patient's chart:   Medical and social history Use of alcohol, tobacco or illicit drugs  Current medications and supplements including opioid prescriptions.  Patient is not currently taking opioid prescriptions. Functional ability and status Nutritional status Physical activity Advanced directives List of other physicians Hospitalizations, surgeries, and ER visits in previous 12 months Vitals Screenings to include cognitive, depression, and falls Referrals and appointments  In addition, I have reviewed and discussed with patient certain preventive protocols, quality metrics, and best practice recommendations. A written personalized care plan for preventive services as well as general preventive health recommendations were provided to patient.     Sydell Axon, LPN   02/08/5783   After Visit Summary: (MyChart) Due to this being a telephonic visit, the after visit summary with patients personalized plan was offered to patient via MyChart   Nurse Notes: None

## 2023-06-16 DIAGNOSIS — H353132 Nonexudative age-related macular degeneration, bilateral, intermediate dry stage: Secondary | ICD-10-CM | POA: Diagnosis not present

## 2023-06-16 DIAGNOSIS — Z961 Presence of intraocular lens: Secondary | ICD-10-CM | POA: Diagnosis not present

## 2023-06-16 DIAGNOSIS — E113211 Type 2 diabetes mellitus with mild nonproliferative diabetic retinopathy with macular edema, right eye: Secondary | ICD-10-CM | POA: Diagnosis not present

## 2023-06-16 LAB — HM DIABETES EYE EXAM

## 2023-07-20 ENCOUNTER — Other Ambulatory Visit (HOSPITAL_COMMUNITY): Payer: Self-pay

## 2023-07-20 ENCOUNTER — Encounter: Payer: Self-pay | Admitting: Oncology

## 2023-07-25 DIAGNOSIS — E113293 Type 2 diabetes mellitus with mild nonproliferative diabetic retinopathy without macular edema, bilateral: Secondary | ICD-10-CM | POA: Diagnosis not present

## 2023-07-25 DIAGNOSIS — Z961 Presence of intraocular lens: Secondary | ICD-10-CM | POA: Diagnosis not present

## 2023-07-25 DIAGNOSIS — E113211 Type 2 diabetes mellitus with mild nonproliferative diabetic retinopathy with macular edema, right eye: Secondary | ICD-10-CM | POA: Diagnosis not present

## 2023-07-25 DIAGNOSIS — H353132 Nonexudative age-related macular degeneration, bilateral, intermediate dry stage: Secondary | ICD-10-CM | POA: Diagnosis not present

## 2023-07-29 DIAGNOSIS — M5030 Other cervical disc degeneration, unspecified cervical region: Secondary | ICD-10-CM | POA: Diagnosis not present

## 2023-07-29 DIAGNOSIS — R519 Headache, unspecified: Secondary | ICD-10-CM | POA: Diagnosis not present

## 2023-07-29 DIAGNOSIS — G609 Hereditary and idiopathic neuropathy, unspecified: Secondary | ICD-10-CM | POA: Diagnosis not present

## 2023-07-29 DIAGNOSIS — G250 Essential tremor: Secondary | ICD-10-CM | POA: Diagnosis not present

## 2023-07-29 DIAGNOSIS — G3184 Mild cognitive impairment, so stated: Secondary | ICD-10-CM | POA: Diagnosis not present

## 2023-08-08 ENCOUNTER — Other Ambulatory Visit: Payer: Self-pay | Admitting: Internal Medicine

## 2023-08-12 ENCOUNTER — Other Ambulatory Visit: Payer: Self-pay | Admitting: Internal Medicine

## 2023-08-23 ENCOUNTER — Ambulatory Visit: Payer: Medicare PPO | Admitting: Physician Assistant

## 2023-08-23 ENCOUNTER — Encounter: Payer: Self-pay | Admitting: Physician Assistant

## 2023-08-23 VITALS — BP 131/71 | HR 85 | Ht 62.0 in | Wt 142.0 lb

## 2023-08-23 DIAGNOSIS — R3129 Other microscopic hematuria: Secondary | ICD-10-CM

## 2023-08-23 LAB — URINALYSIS, COMPLETE
Bilirubin, UA: NEGATIVE
Ketones, UA: NEGATIVE
Leukocytes,UA: NEGATIVE
Nitrite, UA: NEGATIVE
Protein,UA: NEGATIVE
Specific Gravity, UA: 1.02 (ref 1.005–1.030)
Urobilinogen, Ur: 0.2 mg/dL (ref 0.2–1.0)
pH, UA: 5.5 (ref 5.0–7.5)

## 2023-08-23 LAB — MICROSCOPIC EXAMINATION: Epithelial Cells (non renal): >10 /[HPF] — AB (ref 0–10)

## 2023-08-23 NOTE — Progress Notes (Signed)
08/23/2023 2:07 PM   Kelsey Mason 1940/11/18 [id number]  CC: Chief Complaint  Patient presents with   Hematuria   HPI: Kelsey Mason is a 83 y.o. female with PMH microscopic hematuria with benign workup in 2024 who presents today for annual follow-up.   Today she reports no flank pain, dysuria, or gross hematuria this year.  No acute concerns today.  In-office UA today positive for 3+ glucose and 1+ blood; urine microscopy with 3-10 RBCs/HPF, >10 epithelial cells/hpf, and moderate bacteria.  PMH: Past Medical History:  Diagnosis Date   Arthritis    fingers   Breast symptom 2014   Cancer [facility]) 2014   Bilateral Breast, chemo Dr. Koleen Nimrod   Chronic HFmrEF (heart failure with mid-range ejection fraction) (HCC)    a. 01/2017 Echo: EF 50-55%; b. 03/2018 Echo: EF 40-45%; c. 01/2020 Echo: EF 40%, glob HK, mild LVH, GrI DD.   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 01/2020 mild-mod MR.   NICM (nonischemic cardiomyopathy) (HCC)    a. 01/2017 Echo: EF 50-55%; b. 03/2018 Echo: EF 40-45%; c. 01/2020 Echo: EF 40%, glob HK, mild LVH, GrI DD, nl RV fxn, mildly dil LA, mild to mod MR.   Nonobstructive CAD (coronary artery disease)    a. 06/2016 MV: low risk; b. 04/2018 Cath: LM nl, LAD 4m, D1/2/3 nl, LCX nl, OM3 55ost Ca2+, RCA 50ost Ca2+.   PAD (peripheral artery disease) (HCC)    a. 04/2018 Lower ext angio: L pop 35m, R AT 100, R Peroneal 100d.   Personal history of chemotherapy    Personal history of radiation therapy 2014   bilat lumpectomy    Surgical History: Past Surgical History:  Procedure Laterality Date   ABDOMINAL AORTOGRAM W/LOWER EXTREMITY N/A 04/21/2018   Procedure: ABDOMINAL AORTOGRAM W/LOWER EXTREMITY;  Surgeon: Yvonne Kendall, MD;  Location: [facility];  Service:  Cardiovascular;  Laterality: N/A;   ABDOMINAL HYSTERECTOMY  1976   for pelvic pain   BREAST EXCISIONAL BIOPSY Bilateral 11/2012   bilat breast ca chemo and rad   BREAST SURGERY Bilateral 2014   bilateral lumpectomy and SN biopsy   CATARACT EXTRACTION W/PHACO Left 07/30/2020   Procedure: CATARACT EXTRACTION PHACO AND INTRAOCULAR LENS PLACEMENT (IOC) LEFT DIABETIC 18.73 01:45.0 17.8%;  Surgeon: Lockie Mola, MD;  Location: [facility]ERY [facility];  Service: Ophthalmology;  Laterality: Left;  Diabetic - insulin and opral meds   CATARACT EXTRACTION W/PHACO Right 08/13/2020   Procedure: CATARACT EXTRACTION PHACO AND INTRAOCULAR LENS PLACEMENT (IOC) RIGHT DIABETIC;  Surgeon: Lockie Mola, MD;  Location: [facility];  Service: Ophthalmology;  Laterality: Right;  10.67 1:27.8 12.1%   CHOLECYSTECTOMY  2007   COLONOSCOPY WITH PROPOFOL N/A 10/13/2015   Procedure: COLONOSCOPY WITH PROPOFOL;  Surgeon: Scot Jun, MD;  Location: [facility] ENDOSCOPY;  Service: Endoscopy;  Laterality: N/A;   LEFT HEART CATH AND CORONARY ANGIOGRAPHY N/A 04/21/2018   Procedure: LEFT HEART CATH AND CORONARY ANGIOGRAPHY;  Surgeon: Yvonne Kendall, MD;  Location: [facility] INVAS[facility] CV LAB;  Service: Cardiovascular;  Laterality: N/A;   OOPHORECTOMY Right    PORTACATH PLACEMENT  2014   TUMOR REMOVAL Right 1980   right foot    Home Medications:  Allergies as of 08/23/2023       Reactions  Prednisone    Other reaction(s): Dizziness dizzy   Advil [ibuprofen] Other (See Comments)   Dizziness   Aleve [naproxen] Other (See Comments)   Dizziness   Fluticasone-salmeterol Other (See Comments)   dizziness   Lipitor [atorvastatin] Other (See Comments)   Legs weak.  Leg pain, muscle ache   Naproxen Sodium Other (See Comments)   Penicillins Hives, Other (See Comments)   Has patient had a PCN reaction causing immediate rash, facial/tongue/throat swelling, SOB or lightheadedness with hypotension: YES Has patient had  a PCN reaction causing severe rash involving mucus membranes or skin necrosis: no Has patient had a PCN reaction that required hospitalization: no Has patient had a PCN reaction occurring within the last 10 years: no If all of the above answers are "NO", then may proceed with Cephalosporin use.        Medication List        Accurate as of August 23, 2023  2:07 PM. If you have any questions, ask your nurse or doctor.          STOP taking these medications    albuterol 108 (90 Base) MCG/ACT inhaler Commonly known as: VENTOLIN HFA Stopped by: Carman Ching       TAKE these medications    aspirin 81 MG tablet Take 81 mg by mouth daily.   blood glucose meter kit and supplies Kit Dispense based on patient and insurance preference. Use up to four times daily as directed. One Touch Ultra mini meter (FOR ICD-9 250.00, 250.01). Dx: E11.9   carvedilol 12.5 MG tablet Commonly known as: COREG TAKE 1 TABLET BY MOUTH TWICE A DAY   cholecalciferol 25 MCG (1000 UNIT) tablet Commonly known as: VITAMIN D3 Take 1,000 Units by mouth daily.   Claritin 5 MG chewable tablet Generic drug: loratadine Chew 1 tablet (5 mg total) by mouth daily.   cyanocobalamin 1000 MCG tablet Commonly known as: VITAMIN B12 Take 1,000 mcg by mouth daily.   dapagliflozin propanediol 10 MG Tabs tablet Commonly known as: FARXIGA Take 10 mg by mouth daily.   ezetimibe 10 MG tablet Commonly known as: ZETIA Take 1 tablet (10 mg total) by mouth daily.   furosemide 20 MG tablet Commonly known as: LASIX TAKE 1 TABLET (20 MG TOTAL) BY MOUTH DAILY AS NEEDED (FOR >2LB WEIGHT GAIN OVERNIGHT OR 5 LB WEIGHT GAIN IN 1 WEEK OR SWELLING.).   glucose blood test strip Commonly known as: ONE TOUCH ULTRA TEST USE TO TEST BLOOD SUGAR UP TO 4 TIMES DAILY   HYDROcodone bit-homatropine 5-1.5 MG/5ML syrup Commonly known as: HYCODAN Take 5 mLs by mouth every 8 (eight) hours as needed for cough.   Lantus  SoloStar 100 UNIT/ML Solostar Pen Generic drug: insulin glargine Inject 35 Units into the skin daily.   losartan 100 MG tablet Commonly known as: COZAAR TAKE 1/2 TABLET (50 MG TOTAL) BY MOUTH 2 (TWO) TIMES DAILY.   meclizine 25 MG tablet Commonly known as: ANTIVERT Take 1 tablet (25 mg total) by mouth 3 (three) times daily as needed for dizziness.   nitroGLYCERIN 0.4 MG SL tablet Commonly known as: NITROSTAT Place 1 tablet (0.4 mg total) under the tongue every 5 (five) minutes as needed for chest pain. Maximum of 3 doses.   Novofine Pen Needle 32G X 6 MM Misc Generic drug: Insulin Pen Needle USE AS DIRECTED WITH LANTUS PEN   Repatha SureClick 140 MG/ML Soaj Generic drug: Evolocumab INJECT 140 MG INTO THE SKIN EVERY 14 (FOURTEEN) DAYS.  Trulicity 1.5 MG/0.5ML Soaj Generic drug: Dulaglutide Inject 1.5 mg as directed once a week.        Allergies:  Allergies  Allergen Reactions   Prednisone     Other reaction(s): Dizziness dizzy   Advil [Ibuprofen] Other (See Comments)    Dizziness    Aleve [Naproxen] Other (See Comments)    Dizziness   Fluticasone-Salmeterol Other (See Comments)    dizziness   Lipitor [Atorvastatin] Other (See Comments)    Legs weak.  Leg pain, muscle ache   Naproxen Sodium Other (See Comments)   Penicillins Hives and Other (See Comments)    Has patient had a PCN reaction causing immediate rash, facial/tongue/throat swelling, SOB or lightheadedness with hypotension: YES Has patient had a PCN reaction causing severe rash involving mucus membranes or skin necrosis: no Has patient had a PCN reaction that required hospitalization: no Has patient had a PCN reaction occurring within the last 10 years: no If all of the above answers are "NO", then may proceed with Cephalosporin use.     Family History: Family History  Problem Relation Age of Onset   Alcohol abuse Mother    Heart disease Mother    Hypertension Mother    Breast cancer Mother 24    Alcohol abuse Father    Heart disease Father    Hypertension Father    Heart disease Brother    Heart attack Brother     Social History:   reports that she quit smoking about 62 years ago. Her smoking use included cigarettes. She started smoking about 63 years ago. She has a 0.3 pack-year smoking history. She has never been exposed to tobacco smoke. She has never used smokeless tobacco. She reports that she does not drink alcohol and does not use drugs.  Physical Exam: BP 131/71   Pulse 85   Ht 5\' 2"  (1.575 m)   Wt 142 lb (64.4 kg)   BMI 25.97 kg/m   Constitutional:  Alert and oriented, no acute distress, nontoxic appearing HEENT: [place], AT Cardiovascular: No clubbing, cyanosis, or edema Respiratory: Normal respiratory effort, no increased work of breathing Skin: No rashes, bruises or suspicious lesions Neurologic: Grossly intact, no focal deficits, moving all 4 extremities Psychiatric: Normal mood and affect  Laboratory Data: Results for orders placed or performed in visit on 08/23/23  Microscopic Examination   Collection Time: 08/23/23  1:14 PM   Urine  Result Value Ref Range   WBC, UA 0-5 0 - 5 /hpf   RBC, Urine 3-10 (A) 0 - 2 /hpf   Epithelial Cells (non renal) >10 (A) 0 - 10 /hpf   Bacteria, UA Moderate (A) None seen/Few  Urinalysis, Complete   Collection Time: 08/23/23  1:14 PM  Result Value Ref Range   Specific Gravity, UA 1.020 1.005 - 1.030   pH, UA 5.5 5.0 - 7.5   Color, UA Yellow Yellow   Appearance Ur Clear Clear   Leukocytes,UA Negative Negative   Protein,UA Negative Negative/Trace   Glucose, UA 3+ (A) Negative   Ketones, UA Negative Negative   RBC, UA 1+ (A) Negative   Bilirubin, UA Negative Negative   Urobilinogen, Ur 0.2 0.2 - 1.0 mg/dL   Nitrite, UA Negative Negative   Microscopic Examination See below:    Assessment & Plan:   1. Microscopic hematuria (Primary) Stable, no acute concerns today.  Will continue to monitor annually and anticipate  repeat hematuria workup in 2 to 4 years or sooner if she develops gross hematuria.  She is in agreement with this plan. - Urinalysis, Complete  Return in about 1 year (around 08/22/2024) for Annual hematuria workup with UA.  Carman Ching, PA-C  [facility] [place] [address], [address] [place], [place] [address] [phone]

## 2023-08-24 ENCOUNTER — Ambulatory Visit: Payer: Medicare PPO | Admitting: Internal Medicine

## 2023-09-06 LAB — HM DIABETES EYE EXAM

## 2023-11-09 ENCOUNTER — Ambulatory Visit: Payer: Medicare PPO | Attending: Internal Medicine | Admitting: Internal Medicine

## 2023-11-09 ENCOUNTER — Encounter: Payer: Self-pay | Admitting: Internal Medicine

## 2023-11-09 VITALS — BP 166/70 | HR 74 | Ht 62.5 in | Wt 144.2 lb

## 2023-11-09 DIAGNOSIS — I10 Essential (primary) hypertension: Secondary | ICD-10-CM | POA: Diagnosis not present

## 2023-11-09 DIAGNOSIS — I739 Peripheral vascular disease, unspecified: Secondary | ICD-10-CM

## 2023-11-09 DIAGNOSIS — E1169 Type 2 diabetes mellitus with other specified complication: Secondary | ICD-10-CM

## 2023-11-09 DIAGNOSIS — I2510 Atherosclerotic heart disease of native coronary artery without angina pectoris: Secondary | ICD-10-CM

## 2023-11-09 DIAGNOSIS — Z79899 Other long term (current) drug therapy: Secondary | ICD-10-CM

## 2023-11-09 DIAGNOSIS — I5022 Chronic systolic (congestive) heart failure: Secondary | ICD-10-CM | POA: Diagnosis not present

## 2023-11-09 DIAGNOSIS — E785 Hyperlipidemia, unspecified: Secondary | ICD-10-CM

## 2023-11-09 NOTE — Progress Notes (Signed)
 Cardiology Office Note:  .   Date:  11/09/2023  ID:  Kelsey Mason, DOB 07-24-1940, MRN [id number] PCP: Pcp, No  [facility] Providers Cardiologist:  Sammy Crisp, MD     History of Present Illness: .   Kelsey Mason is a 83 y.o. female with history of nonobstructive coronary artery disease, peripheral vascular disease with small vessel disease involving the runoff vessels, nonischemic cardiomyopathy, mitral regurgitation, hypertension, hyperlipidemia, type 2 diabetes mellitus, mild to moderate renal artery stenosis, and breast cancer, who presents for follow-up of CAD, PAD, and HFrEF.  I last saw her in 02/2023, at which time Kelsey Mason felt about the same as at prior visits, still noting some "pulling and tightening" in both calves that was a little more pronounced than at prior visits.  Preceding ABIs and lower extremity arterial Dopplers showed calcified runoff vessels with normal TBI's.  We did not make any medication changes or pursue additional testing.  Today, Kelsey Mason reports that she stopped her carvedilol  3 to 4 weeks ago.  She was concerned because it seemed to make her lightheaded, almost as if she were to blackout.  She held it for couple weeks and felt better.  After resuming it again, she began to feel lightheaded once again.  She denies chest pain, shortness of breath, and palpitations.  She has occasional lower extremity edema with dietary indiscretion but has only needed to use her as needed furosemide  on rare occasions.  She continues to remain active at home, working in her yard, walking, and going line dancing at least twice a week.  She inquires about stopping Repatha  as she developed some soreness and swelling at the injection site from time to time.  Kelsey Mason brings a record of her home blood pressures, which are typically well-controlled, ranging between 105-145/60-80.  No bradycardia was observed.  ROS: See HPI  Studies Reviewed: Aaron Aas   EKG  Interpretation Date/Time:  Wednesday Nov 09 2023 08:55:31 EDT Ventricular Rate:  74 PR Interval:  136 QRS Duration:  132 QT Interval:  440 QTC Calculation: 488 R Axis:   -16  Text Interpretation: Normal sinus rhythm Left bundle branch block Abnormal ECG When compared with ECG of 19-Jan-2023 07:55, No significant change was found Confirmed by Makale Pindell ([id number]) on 11/09/2023 9:06:18 AM    Risk Assessment/Calculations:     HYPERTENSION CONTROL Vitals:   11/09/23 0849 11/09/23 0919  BP: (!) 150/71 (!) 166/70    The patient's blood pressure is elevated above target today.  In order to address the patient's elevated BP:           Physical Exam:   VS:  BP (!) 166/70 (BP Location: Left Arm, Patient Position: Sitting, Cuff Size: Normal)   Pulse 74   Ht 5' 2.5" (1.588 m)   Wt 144 lb 3.2 oz (65.4 kg)   SpO2 98%   BMI 25.95 kg/m    Wt Readings from Last 3 Encounters:  11/09/23 144 lb 3.2 oz (65.4 kg)  08/23/23 142 lb (64.4 kg)  06/13/23 146 lb 2 oz (66.3 kg)    General:  NAD. Neck: No JVD or HJR. Lungs: Clear to auscultation bilaterally without wheezes or crackles. Heart: Regular rate and rhythm without murmurs, rubs, or gallops. Abdomen: Soft, nontender, nondistended. Extremities: No lower extremity edema.  ASSESSMENT AND PLAN: .    Chronic HFrEF due to nonischemic cardiomyopathy: Kelsey Mason appears euvolemic with stable NYHA class II symptoms.  Unfortunately, she did not tolerate carvedilol   due to lightheadedness.  Her blood pressure readings at the time were not particularly low.  Query if she had any intermittent high-grade AV block with her underlying conduction disease.  However, EKG today shows unchanged LBBB without significant AV block.  We have agreed to defer resumption of beta-blocker at this time.  Continue current doses of dapagliflozin and losartan  as well as as needed furosemide .  Blood pressure remains elevated, we may need to consider trial of spironolactone  though close monitoring of her renal function and potassium will be necessary.  Nonobstructive coronary artery disease: No angina reported.  Continue aggressive secondary prevention with ezetimibe  and Repatha .  We will have Kelsey Mason return shortly before her follow-up visit in 3 months for a lipid panel.  Counseling provided on different techniques to help minimize risk for injection site irritation with Repatha .  If this persists, she should let us  know so that we can discuss alternative options.  PAD: No claudication reported with prior aortogram and runoff as well as ABIs demonstrating small vessel disease.  Continue secondary prevention with aspirin  and lipid therapy.  Hypertension: Blood pressure moderately elevated today but typically better at home.  Given recent lightheadedness leading to self discontinuation of carvedilol , I have asked Kelsey Mason to monitor her blood pressure closely at home and to alert us  if it is consistently above 130/80.  Hyperlipidemia associated with type 2 diabetes mellitus: Continue Repatha  and ezetimibe  with counseling provided on injection technique with Repatha .  If irritation/swelling at injection site persists, Kelsey Mason should let us  know so we can consider alternative options.  Ongoing management of DM per her [place].    Dispo: Return to clinic in 3 months.  Signed, Sammy Crisp, MD

## 2023-11-09 NOTE — Patient Instructions (Signed)
 Medication Instructions:  Your physician recommends the following medication changes.  STOP TAKING: Carvedilol  removed from med list  *If you need a refill on your cardiac medications before your next appointment, please call your pharmacy*  Lab Work: Your provider would like for you to return in 10 weeks to have the following labs drawn: Lipid.   Please go to [facility] [address] ([facility] [address], [place] [address] You do not need an appointment.  They are open from 8 am- 4:30 pm.  Lunch from 1:00 pm- 2:00 pm You DO need to be fasting.   You may also go to one of the following [facility]:  [address] [place], [place] [address] Phone: [phone] Lab hours: Mon-Fri 8 am- 5 pm    Lunch 12 pm- 1 pm  [address] [place],  [place]  [address]  [place]  Phone: [phone] Lab hours: 7 am- 4 pm Lunch 12 pm-1 pm   [address] [place],  [place]  [address]  [place]  Phone: [phone] Lab hours: Mon-Fri 8 am- 5 pm    Lunch 12 pm- 1 pm  If you have labs (blood work) drawn today and your tests are completely normal, you will receive your results only by: MyChart Message (if you have MyChart) OR A paper copy in the mail If you have any lab test that is abnormal or we need to change your treatment, we will call you to review the results.  Testing/Procedures: No test ordered today   Continue to monitor BP at home. Call the office if BP > 130/80  Follow-Up: At [facility], you and your health needs are our priority.  As part of our continuing mission to provide you with exceptional heart care, our providers are all part of one team.  This team includes your primary Cardiologist (physician) and Advanced Practice Providers or APPs (Physician Assistants and Nurse Practitioners) who all work together to provide you with the care you need, when you need it.  Your next appointment:   3 month(s)  Provider:   Sammy Crisp, MD    We recommend  signing up for the patient portal called "MyChart".  Sign up information is provided on this After Visit Summary.  MyChart is used to connect with patients for Virtual Visits (Telemedicine).  Patients are able to view lab/test results, encounter notes, upcoming appointments, etc.  Non-urgent messages can be sent to your provider as well.   To learn more about what you can do with MyChart, go to [email or web address].

## 2023-11-11 ENCOUNTER — Other Ambulatory Visit: Payer: Self-pay | Admitting: Internal Medicine

## 2023-11-22 ENCOUNTER — Other Ambulatory Visit: Payer: Self-pay | Admitting: Nephrology

## 2023-11-22 DIAGNOSIS — E1129 Type 2 diabetes mellitus with other diabetic kidney complication: Secondary | ICD-10-CM

## 2023-11-22 DIAGNOSIS — I10 Essential (primary) hypertension: Secondary | ICD-10-CM

## 2023-11-22 DIAGNOSIS — R600 Localized edema: Secondary | ICD-10-CM

## 2023-11-22 DIAGNOSIS — N1832 Chronic kidney disease, stage 3b: Secondary | ICD-10-CM

## 2023-12-05 ENCOUNTER — Ambulatory Visit
Admission: RE | Admit: 2023-12-05 | Discharge: 2023-12-05 | Disposition: A | Discharge status: Home / Self Care | Source: Ambulatory Visit | Attending: Nephrology | Admitting: Nephrology

## 2023-12-05 DIAGNOSIS — R600 Localized edema: Secondary | ICD-10-CM | POA: Insufficient documentation

## 2023-12-05 DIAGNOSIS — E1129 Type 2 diabetes mellitus with other diabetic kidney complication: Secondary | ICD-10-CM | POA: Diagnosis present

## 2023-12-05 DIAGNOSIS — N1832 Chronic kidney disease, stage 3b: Secondary | ICD-10-CM | POA: Diagnosis present

## 2023-12-05 DIAGNOSIS — I10 Essential (primary) hypertension: Secondary | ICD-10-CM | POA: Insufficient documentation

## 2023-12-08 ENCOUNTER — Ambulatory Visit: Payer: Medicare PPO | Admitting: Family Medicine

## 2023-12-08 ENCOUNTER — Encounter: Payer: Self-pay | Admitting: Family Medicine

## 2023-12-08 VITALS — BP 126/64 | HR 67 | Temp 98.1°F | Resp 20 | Ht 62.5 in | Wt 143.5 lb

## 2023-12-08 DIAGNOSIS — I152 Hypertension secondary to endocrine disorders: Secondary | ICD-10-CM

## 2023-12-08 DIAGNOSIS — E1169 Type 2 diabetes mellitus with other specified complication: Secondary | ICD-10-CM

## 2023-12-08 DIAGNOSIS — H8110 Benign paroxysmal vertigo, unspecified ear: Secondary | ICD-10-CM

## 2023-12-08 DIAGNOSIS — E1159 Type 2 diabetes mellitus with other circulatory complications: Secondary | ICD-10-CM | POA: Diagnosis not present

## 2023-12-08 DIAGNOSIS — N1832 Chronic kidney disease, stage 3b: Secondary | ICD-10-CM

## 2023-12-08 DIAGNOSIS — I739 Peripheral vascular disease, unspecified: Secondary | ICD-10-CM

## 2023-12-08 DIAGNOSIS — E1122 Type 2 diabetes mellitus with diabetic chronic kidney disease: Secondary | ICD-10-CM

## 2023-12-08 DIAGNOSIS — E785 Hyperlipidemia, unspecified: Secondary | ICD-10-CM

## 2023-12-08 DIAGNOSIS — Z794 Long term (current) use of insulin: Secondary | ICD-10-CM

## 2023-12-08 MED ORDER — LANTUS SOLOSTAR 100 UNIT/ML ~~LOC~~ SOPN: 18.0000 [IU] | PEN_INJECTOR | Freq: Every day | SUBCUTANEOUS | Status: AC

## 2023-12-08 NOTE — Progress Notes (Unsigned)
 SUBJECTIVE:   Chief Complaint  Patient presents with   Medical Management of Chronic Issues    6 Month follow   HPI Presents for follow up for chronic disease management  Discussed the use of AI scribe software for clinical note transcription with the patient, who gave verbal consent to proceed.  History of Present Illness Kelsey Alvine Mostafa "Lona Rist" is an 83 year old female with diabetes who presents for a follow-up visit regarding diabetes management.  She is under the care of an endocrinologist for diabetes management and is currently taking Lantus  at a dose of 18 units, reduced from 35 units due to improved blood sugar levels. She also takes Trulicity 1.5 mg weekly. Her blood sugar levels at home have been slightly elevated this week, with a reading of 113 mg/dL this morning, but typically range between 70 and 94 mg/dL. She administers her Lantus  at 9:30 PM daily.  She is on Cozaar  (losartan ) 50 mg twice daily, prescribed by her cardiologist. She has nitroglycerin  tablets on hand, which are nearing expiration, but she has never used them. She also takes Zetia  for cholesterol management and Farxiga, which is prescribed by her endocrinologist.  She has a history of vertigo, for which she was prescribed meclizine . She takes it as needed, approximately once or twice a week, when she feels dizzy. The vertigo was initially triggered in July of last year, and she experienced a fall at that time, but has not had any falls since.  She is also on Lasix  as needed for leg swelling, which she attributes to dietary choices such as consuming Coke or candy. She takes vitamin B12 and vitamin D  supplements regularly.  She is active, engaging in activities such as mowing her yard and country western dancing on Saturday nights. No chest pain or shortness of breath, and swelling in her legs only when she consumes Coke or candy.     PERTINENT PMH / PSH: As above  OBJECTIVE:  BP 126/64   Pulse 67    Temp 98.1 F (36.7 C)   Resp 20   Ht 5' 2.5" (1.588 m)   Wt 143 lb 8 oz (65.1 kg)   SpO2 99%   BMI 25.83 kg/m    Physical Exam Vitals reviewed.  Constitutional:      General: She is not in acute distress.    Appearance: She is not ill-appearing.  HENT:     Head: Normocephalic.     Right Ear: Tympanic membrane, ear canal and external ear normal.     Left Ear: Tympanic membrane, ear canal and external ear normal.     Nose: Nose normal.     Mouth/Throat:     Mouth: Mucous membranes are moist.  Eyes:     Extraocular Movements: Extraocular movements intact.     Conjunctiva/sclera: Conjunctivae normal.     Pupils: Pupils are equal, round, and reactive to light.  Neck:     Thyroid : No thyromegaly or thyroid  tenderness.     Vascular: No carotid bruit.  Cardiovascular:     Rate and Rhythm: Normal rate and regular rhythm.     Pulses: Normal pulses.     Heart sounds: Normal heart sounds.  Pulmonary:     Effort: Pulmonary effort is normal.     Breath sounds: Normal breath sounds.  Abdominal:     General: Bowel sounds are normal. There is no distension.     Palpations: Abdomen is soft.     Tenderness: There is no abdominal  tenderness. There is no right CVA tenderness, left CVA tenderness, guarding or rebound.  Musculoskeletal:        General: Normal range of motion.     Cervical back: Normal range of motion.     Right lower leg: No edema.     Left lower leg: No edema.  Lymphadenopathy:     Cervical: No cervical adenopathy.  Skin:    Capillary Refill: Capillary refill takes less than 2 seconds.  Neurological:     General: No focal deficit present.     Mental Status: She is alert and oriented to person, place, and time. Mental status is at baseline.     Motor: No weakness.  Psychiatric:        Mood and Affect: Mood normal.        Behavior: Behavior normal.        Thought Content: Thought content normal.        Judgment: Judgment normal.           06/13/2023    2:34  PM 06/10/2023    8:13 AM 01/25/2023   11:53 AM 12/08/2022    8:25 AM 09/30/2022   12:11 PM  Depression screen PHQ 2/9  Decreased Interest 0 0 0 0 0  Down, Depressed, Hopeless 0 0 0 0 0  PHQ - 2 Score 0 0 0 0 0  Altered sleeping 0 0 0 0 1  Tired, decreased energy 0 0 0 0 3  Change in appetite 0 0 0 0 3  Feeling bad or failure about yourself  0 0 0 0 0  Trouble concentrating 0 0 0 0 0  Moving slowly or fidgety/restless 0 0 0 0 0  Suicidal thoughts 0 0 0 0 0  PHQ-9 Score 0 0 0 0 7  Difficult doing work/chores Not difficult at all Not difficult at all Not difficult at all Not difficult at all Somewhat difficult      06/10/2023    8:13 AM 01/25/2023   11:54 AM 12/08/2022    8:26 AM 09/30/2022   12:12 PM  GAD 7 : Generalized Anxiety Score  Nervous, Anxious, on Edge 0 0 0 0  Control/stop worrying 0 0 0 0  Worry too much - different things 0 0 0 0  Trouble relaxing 0 0 0 0  Restless 0 0 0 0  Easily annoyed or irritable 0 0 0 0  Afraid - awful might happen 0 0 0 0  Total GAD 7 Score 0 0 0 0  Anxiety Difficulty Not difficult at all Not difficult at all Not difficult at all Not difficult at all    ASSESSMENT/PLAN:  Hyperlipidemia associated with type 2 diabetes mellitus (HCC) Assessment & Plan: Managed by cardiologist with Zetia  and Repatha  - Continue Zetia  and Repatha  as prescribed by cardiologist.   Type 2 diabetes mellitus with stage 3b chronic kidney disease, with long-term current use of insulin  (HCC) Assessment & Plan: Blood glucose levels slightly elevated; managed by endocrinologist. - Currently taking Lantus  18 units daily and Tresiba 1.5 mg weekly - Continue Farxiga 10 mg daily - Continue current diabetes management with endocrinologist. - Monitor blood glucose levels at home. - A1c due.  Scheduled for labs tomorrow as ordered by endo. - Follow up with endocrinologist on Monday.   Hypertension associated with diabetes (HCC) Assessment & Plan: Blood pressure  well-controlled on losartan . Managed by Cardiology - Taking losartan  50 mg twice daily. - Use Lasix  as needed for leg swelling.  Benign paroxysmal positional vertigo, unspecified laterality Assessment & Plan: Occasional dizziness managed with meclizine . - Use meclizine  as needed for dizziness.   Chronic kidney disease, stage 3b (HCC) Assessment & Plan: Taking Farxiga daily Follows with Nephrology   PAD (peripheral artery disease) (HCC) Assessment & Plan: Continue Zetia  and Repatha  Follows with Cardiology    Other orders -     Lantus  SoloStar; Inject 18 Units into the skin daily.    PDMP reviewed  Return if symptoms worsen or fail to improve, for PCP.  Valli Gaw, MD

## 2023-12-08 NOTE — Patient Instructions (Signed)
 It was a pleasure meeting you today. Thank you for allowing me to take part in your health care.  Our goals for today as we discussed include:  Blood pressure is good Follow up with Dr End as scheduled   Follow with with  blood work tomorrow and Dr Vergil Glasser on Monday as scheduled for your Diabetes  Follow up with Dr Zelda Hickman as scheduled  This is a list of the screening recommended for you and due dates:  Health Maintenance  Topic Date Due   Hemoglobin A1C  06/07/2023   Yearly kidney health urinalysis for diabetes  08/25/2023   COVID-19 Vaccine (9 - Pfizer risk 2024-25 season) 09/14/2023   Complete foot exam   12/08/2023   Yearly kidney function blood test for diabetes  01/25/2024   Flu Shot  02/03/2024   Mammogram  05/19/2024   Medicare Annual Wellness Visit  06/12/2024   Eye exam for diabetics  09/05/2024   DTaP/Tdap/Td vaccine (2 - Td or Tdap) 01/18/2033   DEXA scan (bone density measurement)  Completed   HPV Vaccine  Aged Out   Meningitis B Vaccine  Aged Out   Pneumonia Vaccine  Discontinued   Zoster (Shingles) Vaccine  Discontinued     If you have any questions or concerns, please do not hesitate to call the office at [phone].  I look forward to our next visit and until then take care and stay safe.  Regards,   Valli Gaw, MD   [facility]

## 2023-12-09 ENCOUNTER — Encounter: Payer: Medicare PPO | Admitting: Family Medicine

## 2023-12-09 ENCOUNTER — Encounter: Payer: Self-pay | Admitting: Family Medicine

## 2023-12-09 ENCOUNTER — Other Ambulatory Visit: Payer: Self-pay | Admitting: Internal Medicine

## 2023-12-09 NOTE — Assessment & Plan Note (Signed)
 Blood pressure well-controlled on losartan . Managed by Cardiology - Taking losartan  50 mg twice daily. - Use Lasix  as needed for leg swelling.

## 2023-12-09 NOTE — Assessment & Plan Note (Signed)
 Taking Farxiga daily Follows with Nephrology

## 2023-12-09 NOTE — Assessment & Plan Note (Signed)
 Managed by cardiologist with Zetia  and Repatha  - Continue Zetia  and Repatha  as prescribed by cardiologist.

## 2023-12-09 NOTE — Assessment & Plan Note (Signed)
 Occasional dizziness managed with meclizine . - Use meclizine  as needed for dizziness.

## 2023-12-09 NOTE — Assessment & Plan Note (Signed)
 Blood glucose levels slightly elevated; managed by endocrinologist. - Currently taking Lantus  18 units daily and Tresiba 1.5 mg weekly - Continue Farxiga 10 mg daily - Continue current diabetes management with endocrinologist. - Monitor blood glucose levels at home. - A1c due.  Scheduled for labs tomorrow as ordered by endo. - Follow up with endocrinologist on Monday.

## 2023-12-09 NOTE — Assessment & Plan Note (Signed)
 Continue Zetia  and Repatha  Follows with Cardiology

## 2024-02-08 ENCOUNTER — Other Ambulatory Visit: Payer: Self-pay | Admitting: Internal Medicine

## 2024-02-24 ENCOUNTER — Other Ambulatory Visit: Payer: Self-pay | Admitting: Internal Medicine

## 2024-02-28 ENCOUNTER — Ambulatory Visit: Payer: Self-pay | Admitting: Internal Medicine

## 2024-02-28 LAB — LIPID PANEL
Chol/HDL Ratio: 1.9 ratio (ref 0.0–4.4)
Cholesterol, Total: 122 mg/dL (ref 100–199)
HDL: 63 mg/dL (ref >39)
LDL Chol Calc (NIH): 44 mg/dL (ref 0–99)
Triglycerides: 74 mg/dL (ref 0–149)
VLDL Cholesterol Cal: 15 mg/dL (ref 5–40)

## 2024-03-02 ENCOUNTER — Ambulatory Visit: Attending: Internal Medicine | Admitting: Internal Medicine

## 2024-03-02 ENCOUNTER — Encounter: Payer: Self-pay | Admitting: Internal Medicine

## 2024-03-02 VITALS — BP 152/60 | HR 74 | Ht 62.0 in | Wt 141.4 lb

## 2024-03-02 DIAGNOSIS — E785 Hyperlipidemia, unspecified: Secondary | ICD-10-CM

## 2024-03-02 DIAGNOSIS — I5022 Chronic systolic (congestive) heart failure: Secondary | ICD-10-CM

## 2024-03-02 DIAGNOSIS — E1169 Type 2 diabetes mellitus with other specified complication: Secondary | ICD-10-CM

## 2024-03-02 DIAGNOSIS — I10 Essential (primary) hypertension: Secondary | ICD-10-CM

## 2024-03-02 DIAGNOSIS — I2510 Atherosclerotic heart disease of native coronary artery without angina pectoris: Secondary | ICD-10-CM | POA: Diagnosis not present

## 2024-03-02 NOTE — Patient Instructions (Signed)

## 2024-03-02 NOTE — Progress Notes (Signed)
 Cardiology Office Note:  .   Date:  03/02/2024  ID:  Kelsey Mason, DOB Apr 14, 1941, MRN [id number] PCP: Pcp, No  [facility] Providers Cardiologist:  Lonni Hanson, MD     History of Present Illness: .   Kelsey Mason is a 83 y.o. female with history of nonobstructive coronary artery disease, peripheral vascular disease with small vessel disease involving the runoff vessels, nonischemic cardiomyopathy, mitral regurgitation, hypertension, hyperlipidemia, type 2 diabetes mellitus, mild to moderate renal artery stenosis, and breast cancer, who presents for follow-up of coronary artery disease, PAD, and HFrEF.  I last saw her in May, at which time Kelsey Mason reported having stopped carvedilol  about a month earlier due to associated lightheadedness.  She denied chest pain and shortness of breath.  Lower extremity edema was infrequent and well-controlled with as needed furosemide .  We agreed to defer resumption of carvedilol .  Today, Kelsey Mason reports that she has had runny nose and sinus congestion for the last 2 days.  Along with this, her blood pressure has been more elevated with systolic readings in the 150s.  Typically, her blood pressures have been well-controlled, usually between 100-130/50-70.  She denies chest pain, shortness of breath, palpitations, lightheaded headedness, and edema.  She is using her as needed furosemide  about once a month.  She continues to dance regularly without claudication.  She tripped and fell over a trash can a few weeks ago, leading to some facial bruising that has since resolved.  ROS: See HPI  Studies Reviewed: SABRA   EKG Interpretation Date/Time:  Friday March 02 2024 08:25:10 EDT Ventricular Rate:  74 PR Interval:  146 QRS Duration:  132 QT Interval:  438 QTC Calculation: 486 R Axis:   -28  Text Interpretation: Normal sinus rhythm Left bundle branch block Abnormal ECG When compared with ECG of 09-Nov-2023 08:55, No significant  change was found Confirmed by Reily Treloar ([id number]) on 03/02/2024 8:35:48 AM    Risk Assessment/Calculations:     HYPERTENSION CONTROL Vitals:   03/02/24 0820 03/02/24 0840  BP: (!) 162/70 (!) 152/60    The patient's blood pressure is elevated above target today.  In order to address the patient's elevated BP: Blood pressure will be monitored at home to determine if medication changes need to be made.          Physical Exam:   VS:  BP (!) 152/60 (BP Location: Left Arm, Patient Position: Sitting, Cuff Size: Normal)   Pulse 74   Ht 5' 2 (1.575 m)   Wt 141 lb 6.4 oz (64.1 kg)   SpO2 98%   BMI 25.86 kg/m    Wt Readings from Last 3 Encounters:  03/02/24 141 lb 6.4 oz (64.1 kg)  12/08/23 143 lb 8 oz (65.1 kg)  11/09/23 144 lb 3.2 oz (65.4 kg)    General:  NAD. Neck: No JVD or HJR. Lungs: Clear to auscultation bilaterally without wheezes or crackles. Heart: Regular rate and rhythm without murmurs, rubs, or gallops. Abdomen: Soft, nontender, nondistended. Extremities: No lower extremity edema.  ASSESSMENT AND PLAN: .    Nonobstructive coronary artery disease: No angina reported.  EKG again shows LBBB.  Continue aspirin , evolocumab , and ezetimibe  to prevent progression of disease.  Hypertension: Blood pressure moderately elevated today but typically better at home.  I suspect her URI might be contributing to her elevated blood pressures.  We have agreed to defer medication changes today.  She will continue to monitor her blood pressure closely at home  and alert us  if it is consistently above 130/80.  Hyperlipidemia associated with type 2 diabetes mellitus: Lipid panel earlier this week showed excellent LDL of 44 and triglycerides of 74.  Continue ezetimibe  and evolocumab .  Chronic heart failure with mildly reduced ejection fraction: Kelsey Mason appears euvolemic with stable NYHA class I symptoms.  LVEF 40% on last echo in 2021.  EKG shows chronic LBBB.  Given stable/minimal  symptoms, we will defer further testing at this time.  Continue as needed furosemide  as well as dapagliflozin and losartan .    Dispo: Return to clinic in 6 months.  Signed, Lonni Hanson, MD

## 2024-05-07 ENCOUNTER — Other Ambulatory Visit: Payer: Self-pay | Admitting: Internal Medicine

## 2024-06-07 ENCOUNTER — Telehealth: Payer: Self-pay

## 2024-06-07 DIAGNOSIS — Z1231 Encounter for screening mammogram for malignant neoplasm of breast: Secondary | ICD-10-CM

## 2024-06-07 NOTE — Telephone Encounter (Signed)
 I am okay with putting in an order for screening mammogram for her. However, if she needs medications refill she will need acute visit with one of the clinician here in clinic. Reviewing her chart her mother has a h/o breast cancer so screening mammogram appropriate, imaging order. Recommend patient call to schedule mammogram at:  [facility] - call [phone]   1. Screening mammogram for breast cancer (Primary) - MM 3D SCREENING MAMMOGRAM BILATERAL BREAST; Future  Luke Shade, MD

## 2024-06-07 NOTE — Telephone Encounter (Signed)
 Copied from CRM #[id number]. Topic: General - Other >> Jun 07, 2024  9:32 AM Thersia BROCKS wrote: Reason for CRM: Patient called in regarding rescheduling her [facility] appointment to 04/02 , patient is due for a mammogram and was needing to see Dr.Bair before she could get that scheduled wanted to know if here is anyway she could see another provider for that so she can get her mammogram before the end of the year , would like a callback regarding this

## 2024-06-07 NOTE — Telephone Encounter (Signed)
 Spoke with patient to make her aware of that Dr Abbey had placed the Mammogram order and she could call to get scheduled. Patient was given contact information. Verbalized understanding.

## 2024-06-07 NOTE — Addendum Note (Signed)
 Addended by: Maitland Muhlbauer on: 06/07/2024 10:20 AM   Modules accepted: Orders

## 2024-06-08 ENCOUNTER — Encounter

## 2024-06-08 DIAGNOSIS — R1013 Epigastric pain: Secondary | ICD-10-CM

## 2024-06-08 DIAGNOSIS — Z794 Long term (current) use of insulin: Secondary | ICD-10-CM

## 2024-06-08 DIAGNOSIS — I10 Essential (primary) hypertension: Secondary | ICD-10-CM

## 2024-06-08 DIAGNOSIS — I5022 Chronic systolic (congestive) heart failure: Secondary | ICD-10-CM

## 2024-06-08 DIAGNOSIS — R1011 Right upper quadrant pain: Secondary | ICD-10-CM

## 2024-06-13 ENCOUNTER — Ambulatory Visit: Payer: Medicare PPO

## 2024-06-13 VITALS — Ht 62.5 in | Wt 136.1 lb

## 2024-06-13 DIAGNOSIS — Z0000 Encounter for general adult medical examination without abnormal findings: Secondary | ICD-10-CM

## 2024-06-13 NOTE — Progress Notes (Signed)
 Chief Complaint  Patient presents with   Medicare Wellness     Subjective:   Kelsey Mason is a 83 y.o. female who presents for a Medicare Annual Wellness Visit.  Visit info / Clinical Intake: Medicare Wellness Visit Type:: Subsequent Annual Wellness Visit Persons participating in visit and providing information:: patient Medicare Wellness Visit Mode:: Telephone If telephone:: video declined Since this visit was completed virtually, some vitals may be partially provided or unavailable. Missing vitals are due to the limitations of the virtual format.: Unable to obtain vitals - no equipment If Telephone or Video please confirm:: I connected with patient using audio/video enable telemedicine. I verified patient identity with two identifiers, discussed telehealth limitations, and patient agreed to proceed. Patient Location:: Home Provider Location:: Office/Home Interpreter Needed?: No Pre-visit prep was completed: yes AWV questionnaire completed by patient prior to visit?: no Living arrangements:: (!) lives alone Patient's Overall Health Status Rating: (!) fair Typical amount of pain: (!) a lot (back pain for 3 years) Does pain affect daily life?: no Are you currently prescribed opioids?: no  Dietary Habits and Nutritional Risks How many meals a day?: 2 Eats fruit and vegetables daily?: (!) no Most meals are obtained by: preparing own meals In the last 2 weeks, have you had any of the following?: none Diabetic:: (!) yes Any non-healing wounds?: no How often do you check your BS?: 2 Would you like to be referred to a Nutritionist or for Diabetic Management? : no  Functional Status Activities of Daily Living (to include ambulation/medication): Independent Ambulation: Independent Medication Administration: Independent Home Management (perform basic housework or laundry): Independent Manage your own finances?: yes Primary transportation is: driving Concerns about  vision?: no *vision screening is required for WTM* Concerns about hearing?: no  Fall Screening Falls in the past year?: 1 Number of falls in past year: 0 Was there an injury with Fall?: 1 (did not have to go to the doctor) Fall Risk Category Calculator: 2 Patient Fall Risk Level: Moderate Fall Risk  Fall Risk Patient at Risk for Falls Due to: History of fall(s); Impaired balance/gait (fell over the garbage can) Fall risk Follow up: Falls evaluation completed; Falls prevention discussed  Home and Transportation Safety: All rugs have non-skid backing?: N/A, no rugs All stairs or steps have railings?: yes Grab bars in the bathtub or shower?: (!) no Have non-skid surface in bathtub or shower?: (!) no Good home lighting?: yes Regular seat belt use?: yes Hospital stays in the last year:: no  Cognitive Assessment Difficulty concentrating, remembering, or making decisions? : no Will 6CIT or Mini Cog be Completed: yes What year is it?: 0 points What month is it?: 0 points Give patient an address phrase to remember (5 components): [address] [place] About what time is it?: 0 points Count backwards from 20 to 1: 0 points Say the months of the year in reverse: 0 points Repeat the address phrase from earlier: 0 points 6 CIT Score: 0 points  Advance Directives (For Healthcare) Does Patient Have a Medical Advance Directive?: Yes Does patient want to make changes to medical advance directive?: No - Patient declined Type of Advance Directive: Healthcare Power of Herman; Living will Copy of Healthcare Power of Attorney in Chart?: Yes - validated most recent copy scanned in chart (See row information) Copy of Living Will in Chart?: Yes - validated most recent copy scanned in chart (See row information)  Reviewed/Updated  Reviewed/Updated: Reviewed All (Medical, Surgical, Family, Medications, Allergies, Care  Teams, Patient Goals)    Allergies (verified) Prednisone , Advil  [ibuprofen], Aleve [naproxen], Fluticasone-salmeterol, Lipitor [atorvastatin], Naproxen sodium, and Penicillins   Current Medications (verified) Outpatient Encounter Medications as of 06/13/2024  Medication Sig   aspirin  81 MG tablet Take 81 mg by mouth daily.   blood glucose meter kit and supplies KIT Dispense based on patient and insurance preference. Use up to four times daily as directed. One Touch Ultra mini meter (FOR ICD-9 250.00, 250.01). Dx: E11.9   cholecalciferol  (VITAMIN D ) 25 MCG (1000 UT) tablet Take 1,000 Units by mouth daily.   dapagliflozin propanediol (FARXIGA) 10 MG TABS tablet Take 10 mg by mouth daily.   Evolocumab  (REPATHA  SURECLICK) 140 MG/ML SOAJ INJECT 140 MG INTO THE SKIN EVERY 14 (FOURTEEN) DAYS.   ezetimibe  (ZETIA ) 10 MG tablet TAKE 1 TABLET BY MOUTH EVERY DAY   furosemide  (LASIX ) 20 MG tablet TAKE 1 TABLET (20 MG TOTAL) BY MOUTH DAILY AS NEEDED (FOR >2LB WEIGHT GAIN OVERNIGHT OR 5 LB WEIGHT GAIN IN 1 WEEK OR SWELLING.).   glucose blood (ONE TOUCH ULTRA TEST) test strip USE TO TEST BLOOD SUGAR UP TO 4 TIMES DAILY   HYDROcodone  bit-homatropine (HYCODAN) 5-1.5 MG/5ML syrup Take 5 mLs by mouth every 8 (eight) hours as needed for cough.   insulin  glargine (LANTUS  SOLOSTAR) 100 UNIT/ML Solostar Pen Inject 18 Units into the skin daily.   losartan  (COZAAR ) 100 MG tablet TAKE 1/2 TABLET (50 MG TOTAL) BY MOUTH 2 (TWO) TIMES DAILY.   meclizine  (ANTIVERT ) 25 MG tablet Take 1 tablet (25 mg total) by mouth 3 (three) times daily as needed for dizziness.   nitroGLYCERIN  (NITROSTAT ) 0.4 MG SL tablet Place 1 tablet (0.4 mg total) under the tongue every 5 (five) minutes as needed for chest pain. Maximum of 3 doses.   NOVOFINE PEN NEEDLE 32G X 6 MM MISC USE AS DIRECTED WITH LANTUS  PEN   TRULICITY 1.5 MG/0.5ML SOPN Inject 1.5 mg as directed once a week.   vitamin B-12 (CYANOCOBALAMIN) 1000 MCG tablet Take 1,000 mcg by mouth daily.   No facility-administered encounter medications on file  as of 06/13/2024.    History: Past Medical History:  Diagnosis Date   Arthritis    fingers   Breast symptom 2014   Cancer [facility]) 2014   Bilateral Breast, chemo Dr. Sheryn   Chronic HFmrEF (heart failure with mid-range ejection fraction) (HCC)    a. 01/2017 Echo: EF 50-55%; b. 03/2018 Echo: EF 40-45%; c. 01/2020 Echo: EF 40%, glob HK, mild LVH, GrI DD.   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 01/2020 mild-mod MR.   NICM (nonischemic cardiomyopathy) (HCC)    a. 01/2017 Echo: EF 50-55%; b. 03/2018 Echo: EF 40-45%; c. 01/2020 Echo: EF 40%, glob HK, mild LVH, GrI DD, nl RV fxn, mildly dil LA, mild to mod MR.   Nonobstructive CAD (coronary artery disease)    a. 06/2016 MV: low risk; b. 04/2018 Cath: LM nl, LAD 70m, D1/2/3 nl, LCX nl, OM3 55ost Ca2+, RCA 50ost Ca2+.   PAD (peripheral artery disease)    a. 04/2018 Lower ext angio: L pop 80m, R AT 100, R Peroneal 100d.   Personal history of chemotherapy    Personal history of radiation therapy 2014   bilat lumpectomy   Past Surgical  History:  Procedure Laterality Date   ABDOMINAL AORTOGRAM W/LOWER EXTREMITY N/A 04/21/2018   Procedure: ABDOMINAL AORTOGRAM W/LOWER EXTREMITY;  Surgeon: Mady Bruckner, MD;  Location: [facility] IN[facility];  Service: Cardiovascular;  Laterality: N/A;   ABDOMINAL HYSTERECTOMY  1976   for pelvic pain   BREAST EXCISIONAL BIOPSY Bilateral 11/2012   bilat breast ca chemo and rad   BREAST SURGERY Bilateral 2014   bilateral lumpectomy and SN biopsy   CATARACT EXTRACTION W/PHACO Left 07/30/2020   Procedure: CATARACT EXTRACTION PHACO AND INTRAOCULAR LENS PLACEMENT (IOC) LEFT DIABETIC 18.73 01:45.0 17.8%;  Surgeon: Mittie Gaskin, MD;  Location: [facility];  Service: Ophthalmology;  Laterality: Left;  Diabetic - insulin  and opral meds    CATARACT EXTRACTION W/PHACO Right 08/13/2020   Procedure: CATARACT EXTRACTION PHACO AND INTRAOCULAR LENS PLACEMENT (IOC) RIGHT DIABETIC;  Surgeon: Mittie Gaskin, MD;  Location: [facility];  Service: Ophthalmology;  Laterality: Right;  10.67 1:27.8 12.1%   CHOLECYSTECTOMY  2007   COLONOSCOPY WITH PROPOFOL  N/A 10/13/2015   Procedure: COLONOSCOPY WITH PROPOFOL ;  Surgeon: Lamar ONEIDA Holmes, MD;  [facility] END[facility];  Service: Endoscopy;  Laterality: N/A;   LEFT HEART CATH AND CORONARY ANGIOGRAPHY N/A 04/21/2018   Procedure: LEFT HEART CATH AND CORONARY ANGIOGRAPHY;  Surgeon: Mady Bruckner, MD;  Location: [facility] INVAS[facility] CV LAB;  Service: Cardiovascular;  Laterality: N/A;   OOPHORECTOMY Right    PORTACATH PLACEMENT  2014   TUMOR REMOVAL Right 1980   right foot   Family History  Problem Relation Age of Onset   Alcohol abuse Mother    Heart disease Mother    Hypertension Mother    Breast cancer Mother 30   Alcohol abuse Father    Heart disease Father    Hypertension Father    Heart disease Brother    Heart attack Brother    Social History   Occupational History   Not on file  Tobacco Use   Smoking status: Former    Current packs/day: 0.00    Average packs/day: 0.3 packs/day for 1 year (0.3 ttl pk-yrs)    Types: Cigarettes    Start date: 56    Quit date: 1963    Years since quitting: 62.9    Passive exposure: Never   Smokeless tobacco: Never  Vaping Use   Vaping status: Never Used  Substance and Sexual Activity   Alcohol use: No    Alcohol/week: 0.0 standard drinks of alcohol   Drug use: No   Sexual activity: Never   Tobacco Counseling Counseling given: Not Answered  SDOH Screenings   Food Insecurity: No Food Insecurity (06/13/2024)  Housing: Low Risk  (06/13/2024)  Transportation Needs: No Transportation Needs (06/13/2024)  Utilities: Not At Risk (06/13/2024)  Alcohol Screen: Low Risk  (06/13/2024)  Depression (PHQ2-9): Low Risk  (06/13/2024)   Financial Resource Strain: Low Risk  (06/13/2024)  Physical Activity: Sufficiently Active (06/13/2024)  Social Connections: Socially Isolated (06/13/2024)  Stress: No Stress Concern Present (06/13/2024)  Tobacco Use: Medium Risk (06/13/2024)  Health Literacy: Adequate Health Literacy (06/13/2024)   See flowsheets for full screening details  Depression Screen PHQ 2 & 9 Depression Scale- Over the past 2 weeks, how often have you been bothered by any of the following problems? Little interest or pleasure in doing things: 0 Feeling down, depressed, or hopeless (PHQ Adolescent also includes...irritable): 0 PHQ-2 Total Score: 0 Trouble falling or staying asleep, or sleeping too much: 1 Feeling tired or having little energy: 2 Poor appetite or overeating (PHQ  Adolescent also includes...weight loss): 1 Feeling bad about yourself - or that you are a failure or have let yourself or your family down: 0 Trouble concentrating on things, such as reading the newspaper or watching television (PHQ Adolescent also includes...like school work): 0 Moving or speaking so slowly that other people could have noticed. Or the opposite - being so fidgety or restless that you have been moving around a lot more than usual: 0 Thoughts that you would be better off dead, or of hurting yourself in some way: 0 PHQ-9 Total Score: 4 If you checked off any problems, how difficult have these problems made it for you to do your work, take care of things at home, or get along with other people?: Not difficult at all     Goals Addressed             This Visit's Progress    Patient Stated       Working on trying to get off of insulin              Objective:    Today's Vitals   06/13/24 1435  Weight: 136 lb 2 oz (61.7 kg)  Height: 5' 2.5 (1.588 m)   Body mass index is 24.5 kg/m.  Hearing/Vision screen Hearing Screening - Comments:: No issues Vision Screening - Comments:: Readers, [facility], up to  date Immunizations and Health Maintenance Health Maintenance  Topic Date Due   HEMOGLOBIN A1C  06/07/2023   Diabetic kidney evaluation - Urine ACR  08/25/2023   FOOT EXAM  12/08/2023   Diabetic kidney evaluation - eGFR measurement  01/25/2024   Mammogram  05/19/2024   OPHTHALMOLOGY EXAM  09/05/2024   COVID-19 Vaccine (9 - Pfizer risk 2025-26 season) 10/08/2024   Medicare Annual Wellness (AWV)  06/13/2025   DTaP/Tdap/Td (2 - Td or Tdap) 01/18/2033   Influenza Vaccine  Completed   Bone Density Scan  Completed   Meningococcal B Vaccine  Aged Out   Pneumococcal Vaccine: 50+ Years  Discontinued   Zoster Vaccines- Shingrix  Discontinued        Assessment/Plan:  This is a routine wellness examination for Kelsey Mason.  Patient Care Team: Bair, Kalpana, MD as PCP - General (Family Medicine) End, Lonni, MD as PCP - Cardiology (Cardiology) Dellie Louanne MATSU, MD as Consulting Physician (General Surgery) Solum, Therisa HERO, MD as Physician Assistant (Endocrinology) Pa, [facility])  I have personally reviewed and noted the following in the patients chart:   Medical and social history Use of alcohol, tobacco or illicit drugs  Current medications and supplements including opioid prescriptions. Functional ability and status Nutritional status Physical activity Advanced directives List of other physicians Hospitalizations, surgeries, and ER visits in previous 12 months Vitals Screenings to include cognitive, depression, and falls Referrals and appointments  No orders of the defined types were placed in this encounter.  In addition, I have reviewed and discussed with patient certain preventive protocols, quality metrics, and best practice recommendations. A written personalized care plan for preventive services as well as general preventive health recommendations were provided to patient.   Angeline Fredericks, LPN   [id number]   Return in 1 year (on  06/13/2025).  After Visit Summary: (MyChart) Due to this being a telephonic visit, the after visit summary with patients personalized plan was offered to patient via MyChart   Nurse Notes: Patient is due Hemoglobin A1C, foot exam and urine ACR.  Patient states that she has an appointment scheduled tomorrow 06/14/24 with her  Endocrinologist.

## 2024-06-13 NOTE — Patient Instructions (Addendum)
 Kelsey Mason,  Thank you for taking the time for your Medicare Wellness Visit. I appreciate your continued commitment to your health goals. Please review the care plan we discussed, and feel free to reach out if I can assist you further.  Please note that Annual Wellness Visits do not include a physical exam. Some assessments may be limited, especially if the visit was conducted virtually. If needed, we may recommend an in-person follow-up with your provider.  Ongoing Care Seeing your primary care provider every 3 to 6 months helps us  monitor your health and provide consistent, personalized care.  Make sure that you keep your appointment already scheduled for your mammogram 07/04/2024.  Referrals If a referral was made during today's visit and you haven't received any updates within two weeks, please contact the referred provider directly to check on the status.  Recommended Screenings:  Health Maintenance  Topic Date Due   Hemoglobin A1C  06/07/2023   Yearly kidney health urinalysis for diabetes  08/25/2023   Complete foot exam   12/08/2023   Yearly kidney function blood test for diabetes  01/25/2024   Breast Cancer Screening  05/19/2024   Eye exam for diabetics  09/05/2024   COVID-19 Vaccine (9 - Pfizer risk 2025-26 season) 10/08/2024   Medicare Annual Wellness Visit  06/13/2025   DTaP/Tdap/Td vaccine (2 - Td or Tdap) 01/18/2033   Flu Shot  Completed   Osteoporosis screening with Bone Density Scan  Completed   Meningitis B Vaccine  Aged Out   Pneumococcal Vaccine for age over 53  Discontinued   Zoster (Shingles) Vaccine  Discontinued       06/13/2024    2:38 PM  Advanced Directives  Does Patient Have a Medical Advance Directive? Yes  Type of Estate Agent of [place];Living will  Does patient want to make changes to medical advance directive? No - Patient declined  Copy of Healthcare Power of Attorney in Chart? Yes - validated most recent copy scanned in  chart (See row information)    Vision: Annual vision screenings are recommended for early detection of glaucoma, cataracts, and diabetic retinopathy. These exams can also reveal signs of chronic conditions such as diabetes and high blood pressure.  Dental: Annual dental screenings help detect early signs of oral cancer, gum disease, and other conditions linked to overall health, including heart disease and diabetes.  Please see the attached documents for additional preventive care recommendations.

## 2024-07-04 ENCOUNTER — Ambulatory Visit
Admission: RE | Admit: 2024-07-04 | Discharge: 2024-07-04 | Disposition: A | Discharge status: Home / Self Care | Source: Ambulatory Visit

## 2024-07-04 DIAGNOSIS — Z1231 Encounter for screening mammogram for malignant neoplasm of breast: Secondary | ICD-10-CM | POA: Insufficient documentation

## 2024-07-11 ENCOUNTER — Other Ambulatory Visit: Payer: Self-pay

## 2024-07-11 NOTE — Telephone Encounter (Signed)
 Copied from CRM [id number]. Topic: Clinical - Medication Refill >> Jul 11, 2024  2:14 PM Suzen RAMAN wrote: Medication: meclizine  (ANTIVERT ) 25 MG tablet   Has the patient contacted their pharmacy? Yes   This is the patient's preferred pharmacy:  [id number] [place], [place] - [address] [place] [address] Phone: [phone] Fax: [phone]  Is this the correct pharmacy for this prescription? Yes If no, delete pharmacy and type the correct one.   Has the prescription been filled recently? No  Is the patient out of the medication? Yes  Has the patient been seen for an appointment in the last year OR does the patient have an upcoming appointment? Yes  Can we respond through MyChart? Yes  Agent: Please be advised that Rx refills may take up to 3 business days. We ask that you follow-up with your pharmacy.

## 2024-07-13 LAB — OPHTHALMOLOGY REPORT-SCANNED

## 2024-07-16 MED ORDER — MECLIZINE HCL 25 MG PO TABS: 25.0000 mg | ORAL_TABLET | Freq: Three times a day (TID) | ORAL | 0 refills | Status: AC | PRN

## 2024-08-22 ENCOUNTER — Ambulatory Visit: Payer: Medicare PPO | Admitting: Physician Assistant

## 2024-10-04 ENCOUNTER — Encounter

## 2025-06-19 ENCOUNTER — Ambulatory Visit
